# Patient Record
Sex: Male | Born: 1935 | Race: White | Hispanic: No | Marital: Married | Smoking: Current every day smoker
Health system: Southern US, Community
[De-identification: ages and names within clinical notes are randomized; demographics above are authoritative.]

## PROBLEM LIST (undated history)

## (undated) DIAGNOSIS — K644 Residual hemorrhoidal skin tags: Secondary | ICD-10-CM

## (undated) DIAGNOSIS — I5043 Acute on chronic combined systolic (congestive) and diastolic (congestive) heart failure: Secondary | ICD-10-CM

## (undated) DIAGNOSIS — N200 Calculus of kidney: Secondary | ICD-10-CM

## (undated) DIAGNOSIS — Z95820 Peripheral vascular angioplasty status with implants and grafts: Secondary | ICD-10-CM

## (undated) DIAGNOSIS — I739 Peripheral vascular disease, unspecified: Secondary | ICD-10-CM

## (undated) DIAGNOSIS — I701 Atherosclerosis of renal artery: Secondary | ICD-10-CM

## (undated) DIAGNOSIS — I2510 Atherosclerotic heart disease of native coronary artery without angina pectoris: Secondary | ICD-10-CM

## (undated) DIAGNOSIS — J189 Pneumonia, unspecified organism: Secondary | ICD-10-CM

## (undated) DIAGNOSIS — Z7901 Long term (current) use of anticoagulants: Secondary | ICD-10-CM

## (undated) DIAGNOSIS — E785 Hyperlipidemia, unspecified: Secondary | ICD-10-CM

## (undated) DIAGNOSIS — M1990 Unspecified osteoarthritis, unspecified site: Secondary | ICD-10-CM

## (undated) DIAGNOSIS — E119 Type 2 diabetes mellitus without complications: Secondary | ICD-10-CM

## (undated) DIAGNOSIS — I10 Essential (primary) hypertension: Secondary | ICD-10-CM

## (undated) DIAGNOSIS — S72009A Fracture of unspecified part of neck of unspecified femur, initial encounter for closed fracture: Secondary | ICD-10-CM

## (undated) DIAGNOSIS — E7800 Pure hypercholesterolemia, unspecified: Secondary | ICD-10-CM

## (undated) DIAGNOSIS — K219 Gastro-esophageal reflux disease without esophagitis: Secondary | ICD-10-CM

## (undated) DIAGNOSIS — I480 Paroxysmal atrial fibrillation: Secondary | ICD-10-CM

## (undated) DIAGNOSIS — E0590 Thyrotoxicosis, unspecified without thyrotoxic crisis or storm: Secondary | ICD-10-CM

## (undated) DIAGNOSIS — J42 Unspecified chronic bronchitis: Secondary | ICD-10-CM

## (undated) HISTORY — DX: Acute on chronic combined systolic (congestive) and diastolic (congestive) heart failure: I50.43

## (undated) HISTORY — PX: INGUINAL HERNIA REPAIR: SUR1180

## (undated) HISTORY — PX: TONSILLECTOMY: SUR1361

## (undated) HISTORY — PX: KIDNEY STONE SURGERY: SHX686

## (undated) HISTORY — PX: HERNIA REPAIR: SHX51

## (undated) HISTORY — PX: LITHOTRIPSY: SUR834

## (undated) HISTORY — PX: GLAUCOMA SURGERY: SHX656

## (undated) HISTORY — DX: Atherosclerosis of renal artery: I70.1

## (undated) HISTORY — PX: CARDIAC CATHETERIZATION: SHX172

## (undated) HISTORY — PX: SKIN CANCER EXCISION: SHX779

## (undated) HISTORY — PX: CATARACT EXTRACTION W/ INTRAOCULAR LENS IMPLANT: SHX1309

---

## 1979-03-28 DIAGNOSIS — N200 Calculus of kidney: Secondary | ICD-10-CM

## 1979-03-28 HISTORY — DX: Calculus of kidney: N20.0

## 2001-07-04 ENCOUNTER — Encounter: Payer: Self-pay | Admitting: Internal Medicine

## 2001-07-04 ENCOUNTER — Inpatient Hospital Stay (HOSPITAL_COMMUNITY): Admission: AC | Admit: 2001-07-04 | Discharge: 2001-07-08 | Payer: Self-pay | Admitting: *Deleted

## 2001-07-27 HISTORY — PX: CORONARY ANGIOPLASTY WITH STENT PLACEMENT: SHX49

## 2001-08-12 ENCOUNTER — Encounter: Payer: Self-pay | Admitting: Cardiology

## 2001-08-13 ENCOUNTER — Inpatient Hospital Stay (HOSPITAL_COMMUNITY): Admission: RE | Admit: 2001-08-13 | Discharge: 2001-08-15 | Payer: Self-pay | Admitting: Cardiology

## 2001-08-30 ENCOUNTER — Encounter (HOSPITAL_COMMUNITY): Admission: RE | Admit: 2001-08-30 | Discharge: 2001-11-28 | Payer: Self-pay | Admitting: Cardiology

## 2001-09-19 ENCOUNTER — Emergency Department (HOSPITAL_COMMUNITY): Admission: EM | Admit: 2001-09-19 | Discharge: 2001-09-19 | Payer: Self-pay | Admitting: *Deleted

## 2001-11-29 ENCOUNTER — Encounter (HOSPITAL_COMMUNITY): Admission: RE | Admit: 2001-11-29 | Discharge: 2002-02-27 | Payer: Self-pay | Admitting: Cardiology

## 2003-06-28 ENCOUNTER — Ambulatory Visit (HOSPITAL_COMMUNITY): Admission: RE | Admit: 2003-06-28 | Discharge: 2003-06-28 | Payer: Self-pay | Admitting: General Surgery

## 2006-10-25 ENCOUNTER — Ambulatory Visit (HOSPITAL_COMMUNITY): Admission: RE | Admit: 2006-10-25 | Discharge: 2006-10-25 | Payer: Self-pay | Admitting: General Surgery

## 2006-11-05 ENCOUNTER — Encounter (INDEPENDENT_AMBULATORY_CARE_PROVIDER_SITE_OTHER): Payer: Self-pay | Admitting: Specialist

## 2006-11-05 ENCOUNTER — Ambulatory Visit (HOSPITAL_COMMUNITY): Admission: RE | Admit: 2006-11-05 | Discharge: 2006-11-05 | Payer: Self-pay | Admitting: General Surgery

## 2007-03-23 ENCOUNTER — Ambulatory Visit (HOSPITAL_COMMUNITY): Admission: RE | Admit: 2007-03-23 | Discharge: 2007-03-23 | Payer: Self-pay | Admitting: Cardiology

## 2007-05-20 ENCOUNTER — Ambulatory Visit (HOSPITAL_COMMUNITY): Admission: RE | Admit: 2007-05-20 | Discharge: 2007-05-20 | Payer: Self-pay | Admitting: Cardiology

## 2010-12-09 NOTE — Cardiovascular Report (Signed)
Shaun Mclaughlin, OESTREICHER NO.:  [id number]   MEDICAL RECORD NO.:  [id number]          PATIENT TYPE:  OIB   LOCATION:  2899                         FACILITY:  [facility]   PHYSICIAN:  Madaline Savage, M.D.DATE OF BIRTH:  1935-05-15   DATE OF PROCEDURE:  03/23/2007  DATE OF DISCHARGE:                            CARDIAC CATHETERIZATION   PROCEDURES PERFORMED:  1. Selective coronary angiography by Judkins technique.  2. Retrograde left heart catheterization.  3. Left ventricular angiography.   INTERVENTIONS:  None.   COMPLICATIONS:  None.   PATIENT PROFILE:  Mr. Shonka is a very pleasant, 76 year old gentleman  who has previously had right coronary artery stenting in January 2003.  He has paroxysmal atrial fibrillation and has recently been on Coumadin  so Coumadin to Lovenox crossover was begun and because of a recent  abnormal stress test, the patient entered the cath lab today to make  sure that he had no new lesions in his coronary arteries. This was based  on a stress test that did show a possible reversible perfusion defect.  The patient tolerated today's procedure well and no complications  occurred.   RESULTS/PRESSURES:  The left ventricular pressure was 140/30, end-  diastolic pressure 20.  Central aortic pressure 130/47, mean of 76.  The  patient's heart rate during this procedure was in the mid 40s and the  patient is asymptomatic for that.   ANGIOGRAPHIC RESULTS:  The left main coronary artery was normal.  The  LAD coursed to the cardiac apex and gave rise to two diagonal branches.  The only lesion in the LAD was a 50% stenosis in the mid to distal LAD  which appears smooth and unchanged from last cath. The circumflex is  nondominant and normal. RCA is a large and dominant vessel measuring  approximately 5.0 to 5.25 mm in diameter.  There is a radio-opaque stent  distally in the vessel before the RCA bifurcates into a posterior  descending and a huge  posterolateral branch. That stent is patent.  There are luminal irregularities and ectasia in the proximal RCA and  then the mid RCA which appears as it did at the time of the last cath of  August 12, 2001.  Left ventricular angiogram showed mildly depressed LV  systolic function.  I would call it low normal ejection fraction of  approximately 15%.  There was no segmental abnormality noted.  No mitral  regurgitation was seen.   IMPRESSION:  1. Angiographic patency of the distal RCA at the previous stent site      with no restenosis.  2. Trivial RCA luminal narrowings of 50% mid to distal LAD and      proximal RCA and mid RCA 30% each.   PLAN:  The patient will be reassured about his results.  I do believe  that his stress test was falsely positive. He will resume his Lovenox to  Coumadin crossover and once therapeutic he may come off of his Lovenox.  He has had recent A fib i.e. within the last month.   FINAL IMPRESSION:  1. Paroxysmal A fib.  2. Lovenox to Coumadin crossover in process.  3. Angiographically patent RCA stent distally and trivial disease LAD      and proximal mid circumflex.  4. Low normal LV systolic function.           ______________________________  Madaline Savage, M.D.     WHG/MEDQ  D:  03/23/2007  T:  03/23/2007  Job:  [id number]   cc:   Tally Joe, M.D.

## 2010-12-12 NOTE — Op Note (Signed)
Shaun Mclaughlin, Shaun Mclaughlin               ACCOUNT NO.:  [id number]   MEDICAL RECORD NO.:  [id number]          PATIENT TYPE:  AMB   LOCATION:  DAY                          FACILITY:  [facility]   PHYSICIAN:  Anselm Pancoast. Weatherly, M.D.DATE OF BIRTH:  11-26-1934   DATE OF PROCEDURE:  11/05/2006  DATE OF DISCHARGE:                               OPERATIVE REPORT   PREOPERATIVE DIAGNOSIS:  Large right inguinal.   POSTOPERATIVE DIAGNOSIS:  Large right inguinal, indirect.   OPERATION:  Right inguinal herniorrhaphy with mesh reinforcement,  general anesthesia.   SURGEON:  Anselm Pancoast. Zachery Dakins, M.D.   ASSISTANT:  Nurse.   HISTORY:  Shaun Mclaughlin is a 76 year-old male about 180 pounds,  reasonably short, and I was asked to repair his inguinal hernia.  He has  seen Dr. Carolynne Edouard, and in Dr. Billey Chang illness patient wanted to proceed on  with surgery; and I was asked to manage the problem.  He is on a list of  medications for high blood pressure etcetera, but otherwise is still in  good health and still working.  I did not actually examine him standing  up, but preoperatively the hernia definitely goes down into the scrotum;  and he is sort of pudgy with kind of a long abdominal panniculus.   DESCRIPTION OF PROCEDURE:  The patient preoperatively was given a gram  of Ancef and taken to the operative suite with induction of anesthesia  and an LOA tube placed.  The patient had already been prepped prior to  coming to the OR; and we prepped him with Betadine solution and draped  him in a sterile manner.  As far as feeling the symphysis pubis, the  inguinal ligament, and anterior iliac crest; I blocked with about 10 mL  of the Sensorcaine with adrenaline at the ilioinguinal nerve area; and  then also infiltrated the incision area.   Sharp dissection was carried down through the skin and subcutaneous  tissue which was probably 3 inches in thickness and I identified the  external oblique aponeurosis.  The cord  structures, after the external  oblique was opened, were elevated on a Penrose drain.  He has a large  hernia sac going down to the scrotum.  I carefully separated the hernia  sac from the cord structures.  I opened into the hernia sac and it was  probably 8 inches in length and I very carefully separated the neck of  the hernia sac from the cord structures; and then the high sac ligation  was done with to figure-of-eight sutures of 2-0 silk; and the large  hernia sac separated from the cord structures.  There was a lot of  adipose tissue that we kind of carefully separated; and the pedicles  were ligated with 3-0 Vicryl; taking care that the vas, artery, and  veins were not injured.  As far as the ilioinguinal nerve; I could never  really identify the one with the cord structures.  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  The floor had been completely broken down with its large  hernia; and the  floor was closed in kind of a modified Shouldice with a running #0  Prolene instead of a 2-0 because of his size, re-creating the internal  ring; and I went back and tied the two ends together.   Next, a piece of Prolene mesh shaped like a sail was slipped laterally  and placed reinforcing the floor, suturing it from the inguinal ligament  inferior with a running 2-0 Prolene and the two tails were sutured  together laterally to kind of reinforced the inguinal ring.  The  superior flap was sutured down with interrupted 2-0 Prolene; and then  the external oblique was closed with 2-0 Vicryl.  We had to use  Brewsters and extra long Weitlaner for visualization and fortunately I  was able to get his second scrub nurse to get any better exposure  because of his adiposity.   The testicle was in its normal position of the completion of surgery.  I  closed the subcuticular with 4-0 Vicryl with benzoin and Steri-Strips in  the skin.  The patient will possibly be  released today; if you voids  without problems.  If there is any question of difficulty with voiding,  we will let him stay this evening.  He is on blood pressure medicines,  etcetera which will all be restarted shortly after surgery.           ______________________________  Anselm Pancoast. Zachery Dakins, M.D.     WJW/MEDQ  D:  11/05/2006  T:  11/05/2006  Job:  [id number]   cc:   Tally Joe, M.D.  Fax: [phone]
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
## 2010-12-12 NOTE — Cardiovascular Report (Signed)
[place]. [facility]  Patient:    Shaun Mclaughlin, Shaun Mclaughlin Visit Number: [id number] MRN: [id number]          Service Type: MED Location: [phone] Attending Physician:  Ophelia Shoulder Dictated by:   Madaline Savage, M.D. Proc. Date: 08/12/01 Admit Date:  08/12/2001   CC:         Cardiac Catheterization Laboratory   Cardiac Catheterization  PROCEDURES PERFORMED: 1. Selective coronary angiography by Judkins technique. 2. Retrograde left heart catheterization. 3. Left ventricular angiography. 4. Intracoronary stent deployment into the distal right coronary artery by    direct approach.  COMPLICATIONS: None.  ENTRY SITE: Right femoral.  DYE USED: Omnipaque.  PATIENT PROFILE: The patient is a 76 year old gentleman, who recently has had several episodes of paroxysmal atrial fibrillation or atrial flutter, and is now in sinus rhythm. An outpatient Cardiolite has been performed on August 05, 2001, which showed moderate area of ischemia in the inferior wall. Ejection fraction estimate of 46%. The patient was appraised of the result and was agreeable to catheterization and he presents today on an outpatient basis electively.  RESULTS:  PRESSURES: The left ventricular pressure was 120/10, the central aortic pressure 120/50, mean of 75.  No aortic valve gradient by pullback technique.  ANGIOGRAPHIC RESULTS: The left main coronary artery is normal.  The LAD courses to the cardiac apex and gives rise to two diagonal branches. The LAD shows luminal irregularities in the proximal portion of the vessel in multiple locations, no more than 30% severe. The first diagonal branch is normal.  The second larger diagonal branch contains an ostial 50% stenosis. The LAD distally is normal.  The left circumflex gives rise to two obtuse marginal branches. No lesions were seen.  The right coronary artery is a large very dominant vessel which contains  a shepherds crook deformity proximally. There is a 50% lesion proximally, a 50% lesion mid, 80% lesion distally just before the vessel bifurcates of the PDA and PLA.  The left ventricle shows good contractility. The ejection fraction is is 50%. No mitral regurgitation is seen.  INTERVENTIONAL PROCEDURE: The percutaneous coronary intervention was performed from the right groin with a 7 French introducer sheath. The patient was systemically heparinized. A double bolus of Integrilin was given. The guide catheter used was a Kites right 4.09 without side holes, 7 [place]. The guide wire used was a Radiographer, therapeutic. The vessel was directly stented with a 3.5 x 12 mm Express stent. The guide catheter tended to deep throat the vessel. It did respond to gentle pulling back of the tip of the catheter. The wire crossed the lesion with ease. It did create some wire bias in the downgoing limb of the shepherds crook deformity in the vessel.  The vessel was dilated twice, the first was to 14 or 15 atmospheres with a good result. I then did another dilatation for 60 seconds to 17 atmospheres corresponding to an estimated luminal diameter of 3.9. The lesion of 80% was reduced from 80% to 0 and TIMI class 3 distal flow was well preserved. No side branches were lost.  FINAL DIAGNOSES: 1. Mild two-vessel coronary disease with a ischemic Cardiolite in the inferior    wall distribution. Clinically, the 80% lesion is responsible for this. 2. Well preserved left ventricular systolic function. 3. Successful coronary intervention of the distal right coronary artery with    reduction of an 80% lesion to 0%. Dictated by:   Madaline Savage, M.D.  Attending Physician:  Ophelia Shoulder DD:  08/12/01 TD:  08/15/01 Job: [id number]
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
## 2010-12-12 NOTE — Op Note (Signed)
NAME:  Shaun Mclaughlin, Shaun Mclaughlin                         ACCOUNT NO.:  [id number]   MEDICAL RECORD NO.:  [id number]                   PATIENT TYPE:  AMB   LOCATION:  DAY                                  FACILITY:  [facility]   PHYSICIAN:  Ollen Gross. Vernell Morgans, M.D.              DATE OF BIRTH:  May 22, 1935   DATE OF PROCEDURE:  06/28/2003  DATE OF DISCHARGE:  06/28/2003                                 OPERATIVE REPORT   PREOPERATIVE DIAGNOSIS:  Umbilical hernia.   POSTOPERATIVE DIAGNOSIS:  Umbilical hernia.   OPERATION/PROCEDURE:  Umbilical hernia repair.   SURGEON:  Ollen Gross. Carolynne Edouard, M.D.   ANESTHESIA:  General endotracheal anesthesia.   DESCRIPTION OF PROCEDURE:  After informed consent was obtained, the patient  was brought to the operating room and placed in the supine position on the  operating table.  After adequate induction of general anesthesia, the  patient's abdomen was prepped with Betadine and draped in the usual sterile  manner.  The area around the umbilicus was infiltrated with 0.25% Marcaine.  A small infraumbilical incision was made transversely using a #10 blade  knife.  This incision was carried down through the skin and subcutaneous  tissue sharply with the electrocautery.  Blunt dissection was then carried  out in the area until the hernia sac was identified.  Sharp dissection with  the electrocautery was carried out to free the sac from the surround  subcutaneous tissue.  The contents of the sac contained only fat.  A portion  of this fat was excised sharply with the electrocautery.  The rest was able  to be reduced without difficulty.  Back beneath the fascia, the fascial  edges were thick, clean and healthy.  The defect was only about 1 cm in  diameter.  The defect was then closed with interrupted 0 Prolene stitches.  The wound was then irrigated with saline.  The subcutaneous tissue was  freed, undermined a little bit to free it up, and then closed with 3-0  Vicryl interrupted  stitches and the skin was closed with a running 4-0  Monocryl subcuticular stitch.  Benzoin and Steri-Strips and sterile  dressings with cotton balls for compression were applied.  The patient  tolerated the procedure well.  At the end of the case all needle, sponge and  instrument counts were correct.  The patient was then awakened and taken to  the recovery room in stable condition.                                               Ollen Gross. Vernell Morgans, M.D.    PST/MEDQ  D:  07/02/2003  T:  07/02/2003  Job:  [id number]

## 2011-05-08 LAB — I-STAT EC8
BUN: 17
Bicarbonate: 25 — ABNORMAL HIGH
Chloride: 101
Glucose, Bld: 141 — ABNORMAL HIGH
HCT: 43
Hemoglobin: 14.6
Operator id: 115261
pCO2 arterial: 38.4

## 2012-06-13 ENCOUNTER — Other Ambulatory Visit: Payer: Self-pay | Admitting: Cardiovascular Disease

## 2012-06-14 ENCOUNTER — Encounter (HOSPITAL_COMMUNITY): Payer: Self-pay | Admitting: Pharmacy Technician

## 2012-06-22 ENCOUNTER — Ambulatory Visit
Admission: RE | Admit: 2012-06-22 | Discharge: 2012-06-22 | Disposition: A | Discharge status: Home / Self Care | Payer: 59 | Source: Ambulatory Visit | Attending: Cardiovascular Disease | Admitting: Cardiovascular Disease

## 2012-06-22 ENCOUNTER — Other Ambulatory Visit: Payer: Self-pay | Admitting: Cardiovascular Disease

## 2012-06-22 DIAGNOSIS — Z01818 Encounter for other preprocedural examination: Secondary | ICD-10-CM

## 2012-06-28 ENCOUNTER — Encounter (HOSPITAL_COMMUNITY)
Admission: RE | Disposition: A | Discharge status: Home / Self Care | Payer: Self-pay | Source: Ambulatory Visit | Attending: Cardiovascular Disease

## 2012-06-28 ENCOUNTER — Ambulatory Visit (HOSPITAL_COMMUNITY)
Admission: RE | Admit: 2012-06-28 | Discharge: 2012-06-28 | Disposition: A | Discharge status: Home / Self Care | Payer: 59 | Source: Ambulatory Visit | Attending: Cardiovascular Disease | Admitting: Cardiovascular Disease

## 2012-06-28 DIAGNOSIS — Z7901 Long term (current) use of anticoagulants: Secondary | ICD-10-CM | POA: Insufficient documentation

## 2012-06-28 DIAGNOSIS — I2510 Atherosclerotic heart disease of native coronary artery without angina pectoris: Secondary | ICD-10-CM | POA: Insufficient documentation

## 2012-06-28 DIAGNOSIS — I10 Essential (primary) hypertension: Secondary | ICD-10-CM | POA: Insufficient documentation

## 2012-06-28 DIAGNOSIS — Z9861 Coronary angioplasty status: Secondary | ICD-10-CM | POA: Insufficient documentation

## 2012-06-28 DIAGNOSIS — I701 Atherosclerosis of renal artery: Secondary | ICD-10-CM | POA: Insufficient documentation

## 2012-06-28 DIAGNOSIS — E785 Hyperlipidemia, unspecified: Secondary | ICD-10-CM | POA: Insufficient documentation

## 2012-06-28 DIAGNOSIS — E0590 Thyrotoxicosis, unspecified without thyrotoxic crisis or storm: Secondary | ICD-10-CM | POA: Insufficient documentation

## 2012-06-28 DIAGNOSIS — I771 Stricture of artery: Secondary | ICD-10-CM | POA: Insufficient documentation

## 2012-06-28 HISTORY — PX: UPPER EXTREMITY ANGIOGRAM: SHX6310

## 2012-06-28 HISTORY — PX: LEFT HEART CATHETERIZATION WITH CORONARY ANGIOGRAM: SHX5451

## 2012-06-28 LAB — PROTIME-INR
INR: 1.41 (ref 0.00–1.49)
Prothrombin Time: 16.9 seconds — ABNORMAL HIGH (ref 11.6–15.2)

## 2012-06-28 SURGERY — LEFT HEART CATHETERIZATION WITH CORONARY ANGIOGRAM
Anesthesia: LOCAL

## 2012-06-28 MED ORDER — MIDAZOLAM HCL 2 MG/2ML IJ SOLN
INTRAMUSCULAR | Status: AC
  Filled 2012-06-28: qty 2

## 2012-06-28 MED ORDER — HYDRALAZINE HCL 20 MG/ML IJ SOLN
10.0000 mg | Freq: Four times a day (QID) | INTRAMUSCULAR | Status: DC | PRN
  Administered 2012-06-28: 10 mg via INTRAVENOUS

## 2012-06-28 MED ORDER — LIDOCAINE HCL (PF) 1 % IJ SOLN
INTRAMUSCULAR | Status: AC
  Filled 2012-06-28: qty 30

## 2012-06-28 MED ORDER — HEPARIN (PORCINE) IN NACL 2-0.9 UNIT/ML-% IJ SOLN
INTRAMUSCULAR | Status: AC
  Filled 2012-06-28: qty 1000

## 2012-06-28 MED ORDER — SODIUM CHLORIDE 0.9 % IV SOLN
INTRAVENOUS | Status: DC
  Administered 2012-06-28: 12:00:00 via INTRAVENOUS

## 2012-06-28 MED ORDER — FENTANYL CITRATE 0.05 MG/ML IJ SOLN
INTRAMUSCULAR | Status: AC
  Filled 2012-06-28: qty 2

## 2012-06-28 MED ORDER — HYDRALAZINE HCL 20 MG/ML IJ SOLN
INTRAMUSCULAR | Status: AC
  Filled 2012-06-28: qty 1

## 2012-06-28 MED ORDER — ONDANSETRON HCL 4 MG/2ML IJ SOLN: 4.0000 mg | Freq: Four times a day (QID) | INTRAMUSCULAR | Status: DC | PRN

## 2012-06-28 MED ORDER — SODIUM CHLORIDE 0.9 % IJ SOLN: 3.0000 mL | INTRAMUSCULAR | Status: DC | PRN

## 2012-06-28 MED ORDER — SODIUM CHLORIDE 0.9 % IV SOLN: INTRAVENOUS | Status: DC

## 2012-06-28 MED ORDER — NITROGLYCERIN 0.2 MG/ML ON CALL CATH LAB
INTRAVENOUS | Status: AC
  Filled 2012-06-28: qty 1

## 2012-06-28 MED ORDER — ACETAMINOPHEN 325 MG PO TABS: 650.0000 mg | ORAL_TABLET | ORAL | Status: DC | PRN

## 2012-06-28 MED ORDER — MORPHINE SULFATE 2 MG/ML IJ SOLN: 1.0000 mg | INTRAMUSCULAR | Status: DC | PRN

## 2012-06-28 MED ORDER — DIAZEPAM 5 MG PO TABS
5.0000 mg | ORAL_TABLET | ORAL | Status: AC
  Administered 2012-06-28: 5 mg via ORAL
  Filled 2012-06-28: qty 1

## 2012-06-28 NOTE — Op Note (Signed)
JARRON CURLEY is a 77 y.o. male    [id number] LOCATION:  FACILITY: [facility]  PHYSICIAN: Nanetta Batty, M.D. 03-04-1935   DATE OF PROCEDURE:  06/28/2012  DATE OF DISCHARGE:  [facility]  CARDIAC CATHETERIZATION     History obtained from chart review. Mr. Pettengill is a 76 year old Caucasian male patient of Dr. Royann Shivers  with a history of CAD status post RCA stenting remotely with angiography in 2000 a performer Dr. Lavonne Chick that revealed a patent RCA stent and otherwise minimal CAD. He has a history of treated hypertension, dyslipidemia and discontinued tobacco abuse. We will following his upper extremity Dopplers have shown progression of disease with left upper extremity location. A Myoview stress test showed ischemia in several vascular territories and because of this he presents today for  Cardiac catheterization and left subclavian angiography and potential percutaneous intervention. He was on Coumadin for paroxysmal atrial fibrillation which was discontinued prior to the procedure and he remains today in sinus rhythm on antiarrhythmic medication.   PROCEDURE DESCRIPTION:    The patient was brought to the second floor  [place] Cardiac cath lab in the postabsorptive state. He was premedicated with Valium 5 mg by mouth, IV Versed and fentanyl. His right groinwas prepped and shaved in usual sterile fashion. Xylocaine 1% was used for local anesthesia. A 5 French sheath was inserted into the right common femoral artery using standard Seldinger technique. 5 French right and left Judkins diagnostic catheters a 5 French pigtail catheter were used for selective coronary angiography, left ventriculography, and selective right and left subclavian artery angiography. Visipaque dye was used for the entirety of the case. Retrograde aortic, left ventricular and pullback pressures were recorded. A pullback pressure was also recorded across the distal left subclavian artery  stenosis.   HEMODYNAMICS:    AO SYSTOLIC/AO DIASTOLIC: 164/68   LV SYSTOLIC/LV DIASTOLIC: 164/24  ANGIOGRAPHIC RESULTS:   1. Left main; normal  2. LAD; minor irregularity 3. Left circumflex; nondominant moderate regularities.  4. Right coronary artery; dominant with a patent distal stent and 40% stenosis the proximal and midportion 5. Left ventriculography; RAO left ventriculogram was performed using  25 mL of Visipaque dye at 12 mL/second. The overall LVEF estimated  60 % Without wall motion abnormalities 6. Left subclavian artery-there was a 50-60% left renal artery stenosis beyond the LIMA takeoff followed by a 90% stenosis in the distal left leg and artery/proximal left axillary artery with a 50 mm pullback gradient.  IMPRESSION:Mr. Bucklin has essentially normal coronary arteries and normal left ventricular function. The Myoview stress test was false positive. He has a 90% stenosis in the distal left subclavian artery, proximal left axillary artery with a 50 mm pullback gradient. I believe this is responsible for his left upper extremity claudication however I am somewhat hesitant to stent this given that it is in an atypical location for atherosclerosis . In addition to the potentially any anatomic location of torsion stent strut fracture remains a potential problem. Because of this I am going to send him home as an outpatient, and I will review the angiograms with my colleagues prior to making a decision regarding intervention. The sheath was removed and pressure was held on the groin to achieve hemostasis. The patient left the Cath Lab in stable condition. He'll be gently hydrated to remain recumbent for 4 hours after which time he'll be discharged home and will followup with me in one to 2 weeks.  [name] MD, [facility] 06/28/2012  3:46 PM

## 2012-06-28 NOTE — H&P (Signed)
  H & P will be scanned in.  Pt was reexamined and existing H & P reviewed. No changes found.  [name], MD [facility] 06/28/2012 3:01 PM

## 2012-07-21 ENCOUNTER — Encounter (HOSPITAL_COMMUNITY): Payer: Self-pay | Admitting: Pharmacy Technician

## 2012-07-27 HISTORY — PX: SUBCLAVIAN STENT PLACEMENT: SUR1038

## 2012-08-04 ENCOUNTER — Ambulatory Visit (HOSPITAL_COMMUNITY)
Admission: RE | Admit: 2012-08-04 | Discharge: 2012-08-05 | Disposition: A | Discharge status: Home / Self Care | Payer: 59 | Source: Ambulatory Visit | Attending: Cardiovascular Disease | Admitting: Cardiovascular Disease

## 2012-08-04 ENCOUNTER — Encounter (HOSPITAL_COMMUNITY): Payer: Self-pay | Admitting: General Practice

## 2012-08-04 ENCOUNTER — Encounter (HOSPITAL_COMMUNITY)
Admission: RE | Disposition: A | Discharge status: Home / Self Care | Payer: Self-pay | Source: Ambulatory Visit | Attending: Cardiovascular Disease

## 2012-08-04 DIAGNOSIS — I2510 Atherosclerotic heart disease of native coronary artery without angina pectoris: Secondary | ICD-10-CM | POA: Insufficient documentation

## 2012-08-04 DIAGNOSIS — I4819 Other persistent atrial fibrillation: Secondary | ICD-10-CM | POA: Diagnosis present

## 2012-08-04 DIAGNOSIS — E785 Hyperlipidemia, unspecified: Secondary | ICD-10-CM | POA: Insufficient documentation

## 2012-08-04 DIAGNOSIS — Z9861 Coronary angioplasty status: Secondary | ICD-10-CM | POA: Insufficient documentation

## 2012-08-04 DIAGNOSIS — I4891 Unspecified atrial fibrillation: Secondary | ICD-10-CM | POA: Insufficient documentation

## 2012-08-04 DIAGNOSIS — E119 Type 2 diabetes mellitus without complications: Secondary | ICD-10-CM | POA: Diagnosis present

## 2012-08-04 DIAGNOSIS — Z7901 Long term (current) use of anticoagulants: Secondary | ICD-10-CM | POA: Insufficient documentation

## 2012-08-04 DIAGNOSIS — I739 Peripheral vascular disease, unspecified: Secondary | ICD-10-CM | POA: Insufficient documentation

## 2012-08-04 DIAGNOSIS — I771 Stricture of artery: Secondary | ICD-10-CM | POA: Insufficient documentation

## 2012-08-04 DIAGNOSIS — I10 Essential (primary) hypertension: Secondary | ICD-10-CM | POA: Insufficient documentation

## 2012-08-04 HISTORY — DX: Pure hypercholesterolemia, unspecified: E78.00

## 2012-08-04 HISTORY — DX: Thyrotoxicosis, unspecified without thyrotoxic crisis or storm: E05.90

## 2012-08-04 HISTORY — DX: Gastro-esophageal reflux disease without esophagitis: K21.9

## 2012-08-04 HISTORY — DX: Peripheral vascular angioplasty status with implants and grafts: Z95.820

## 2012-08-04 HISTORY — DX: Unspecified osteoarthritis, unspecified site: M19.90

## 2012-08-04 HISTORY — DX: Pneumonia, unspecified organism: J18.9

## 2012-08-04 HISTORY — DX: Atherosclerotic heart disease of native coronary artery without angina pectoris: I25.10

## 2012-08-04 HISTORY — DX: Unspecified chronic bronchitis: J42

## 2012-08-04 HISTORY — PX: UNILATERAL UPPER EXTREMEITY ANGIOGRAM: SHX5517

## 2012-08-04 HISTORY — DX: Type 2 diabetes mellitus without complications: E11.9

## 2012-08-04 HISTORY — DX: Residual hemorrhoidal skin tags: K64.4

## 2012-08-04 HISTORY — DX: Hyperlipidemia, unspecified: E78.5

## 2012-08-04 HISTORY — DX: Calculus of kidney: N20.0

## 2012-08-04 HISTORY — DX: Paroxysmal atrial fibrillation: I48.0

## 2012-08-04 HISTORY — DX: Essential (primary) hypertension: I10

## 2012-08-04 HISTORY — DX: Long term (current) use of anticoagulants: Z79.01

## 2012-08-04 HISTORY — DX: Peripheral vascular disease, unspecified: I73.9

## 2012-08-04 LAB — POCT ACTIVATED CLOTTING TIME
Activated Clotting Time: 317 seconds
Activated Clotting Time: 187 seconds

## 2012-08-04 LAB — GLUCOSE, CAPILLARY
Glucose-Capillary: 140 mg/dL — ABNORMAL HIGH (ref 70–99)
Glucose-Capillary: 142 mg/dL — ABNORMAL HIGH (ref 70–99)

## 2012-08-04 LAB — PROTIME-INR: INR: 1.65 — ABNORMAL HIGH (ref 0.00–1.49)

## 2012-08-04 SURGERY — UNILATERAL UPPER EXTREMEITY ANGIOGRAM
Anesthesia: LOCAL

## 2012-08-04 MED ORDER — FENTANYL CITRATE 0.05 MG/ML IJ SOLN
INTRAMUSCULAR | Status: AC
  Filled 2012-08-04: qty 2

## 2012-08-04 MED ORDER — NIACIN ER (ANTIHYPERLIPIDEMIC) 500 MG PO TBCR
500.0000 mg | EXTENDED_RELEASE_TABLET | Freq: Every day | ORAL | Status: DC
  Filled 2012-08-04 (×2): qty 1

## 2012-08-04 MED ORDER — LISINOPRIL 10 MG PO TABS
10.0000 mg | ORAL_TABLET | Freq: Every day | ORAL | Status: DC
  Administered 2012-08-04: 10 mg via ORAL
  Filled 2012-08-04: qty 1

## 2012-08-04 MED ORDER — ACETAMINOPHEN 325 MG PO TABS: 650.0000 mg | ORAL_TABLET | ORAL | Status: DC | PRN

## 2012-08-04 MED ORDER — HEPARIN (PORCINE) IN NACL 2-0.9 UNIT/ML-% IJ SOLN
INTRAMUSCULAR | Status: AC
  Filled 2012-08-04: qty 1000

## 2012-08-04 MED ORDER — ASPIRIN 81 MG PO CHEW
CHEWABLE_TABLET | ORAL | Status: AC
  Filled 2012-08-04: qty 4

## 2012-08-04 MED ORDER — PROPYLTHIOURACIL 50 MG PO TABS
50.0000 mg | ORAL_TABLET | Freq: Every day | ORAL | Status: DC
  Administered 2012-08-04 – 2012-08-05 (×2): 50 mg via ORAL
  Filled 2012-08-04 (×2): qty 1

## 2012-08-04 MED ORDER — ASPIRIN EC 81 MG PO TBEC: 81.0000 mg | DELAYED_RELEASE_TABLET | Freq: Every day | ORAL | Status: DC

## 2012-08-04 MED ORDER — HEPARIN SODIUM (PORCINE) 1000 UNIT/ML IJ SOLN
INTRAMUSCULAR | Status: AC
  Filled 2012-08-04: qty 1

## 2012-08-04 MED ORDER — CLOPIDOGREL BISULFATE 75 MG PO TABS
75.0000 mg | ORAL_TABLET | Freq: Every day | ORAL | Status: DC
  Administered 2012-08-05: 75 mg via ORAL
  Filled 2012-08-04: qty 1

## 2012-08-04 MED ORDER — SODIUM CHLORIDE 0.9 % IJ SOLN: 3.0000 mL | INTRAMUSCULAR | Status: DC | PRN

## 2012-08-04 MED ORDER — LIDOCAINE HCL (PF) 1 % IJ SOLN
INTRAMUSCULAR | Status: AC
  Filled 2012-08-04: qty 30

## 2012-08-04 MED ORDER — HYDRALAZINE HCL 20 MG/ML IJ SOLN
10.0000 mg | INTRAMUSCULAR | Status: DC
  Administered 2012-08-04 – 2012-08-05 (×3): 10 mg via INTRAVENOUS
  Filled 2012-08-04 (×2): qty 0.5

## 2012-08-04 MED ORDER — DIAZEPAM 5 MG PO TABS
5.0000 mg | ORAL_TABLET | ORAL | Status: AC
  Administered 2012-08-04: 5 mg via ORAL
  Filled 2012-08-04: qty 1

## 2012-08-04 MED ORDER — PANTOPRAZOLE SODIUM 40 MG PO TBEC
40.0000 mg | DELAYED_RELEASE_TABLET | Freq: Every day | ORAL | Status: DC
  Administered 2012-08-05: 40 mg via ORAL
  Filled 2012-08-04: qty 1

## 2012-08-04 MED ORDER — SOTALOL HCL 120 MG PO TABS
60.0000 mg | ORAL_TABLET | Freq: Two times a day (BID) | ORAL | Status: DC
  Administered 2012-08-04 – 2012-08-05 (×2): 60 mg via ORAL
  Filled 2012-08-04 (×4): qty 0.5

## 2012-08-04 MED ORDER — SODIUM CHLORIDE 0.9 % IV SOLN
INTRAVENOUS | Status: DC
  Administered 2012-08-04: 1000 mL via INTRAVENOUS

## 2012-08-04 MED ORDER — ONDANSETRON HCL 4 MG/2ML IJ SOLN: 4.0000 mg | Freq: Four times a day (QID) | INTRAMUSCULAR | Status: DC | PRN

## 2012-08-04 MED ORDER — CLOPIDOGREL BISULFATE 300 MG PO TABS
ORAL_TABLET | ORAL | Status: AC
  Filled 2012-08-04: qty 1

## 2012-08-04 MED ORDER — SALINE SPRAY 0.65 % NA SOLN
1.0000 | NASAL | Status: DC | PRN
  Administered 2012-08-04: 1 via NASAL
  Filled 2012-08-04: qty 44

## 2012-08-04 MED ORDER — LATANOPROST 0.005 % OP SOLN
1.0000 [drp] | Freq: Every day | OPHTHALMIC | Status: DC
Start: 2012-08-04 — End: 2012-08-05
  Administered 2012-08-04: 22:00:00 1 [drp] via OPHTHALMIC
  Filled 2012-08-04: qty 2.5

## 2012-08-04 MED ORDER — HYDRALAZINE HCL 20 MG/ML IJ SOLN
INTRAMUSCULAR | Status: AC
Start: 2012-08-04 — End: 2012-08-04
  Administered 2012-08-04: 10 mg via INTRAVENOUS
  Filled 2012-08-04: qty 1

## 2012-08-04 MED ORDER — ATORVASTATIN CALCIUM 40 MG PO TABS
40.0000 mg | ORAL_TABLET | Freq: Every day | ORAL | Status: DC
  Administered 2012-08-04: 20:00:00 40 mg via ORAL
  Filled 2012-08-04 (×2): qty 1

## 2012-08-04 MED ORDER — BRIMONIDINE TARTRATE-TIMOLOL 0.2-0.5 % OP SOLN: 1.0000 [drp] | Freq: Two times a day (BID) | OPHTHALMIC | Status: DC

## 2012-08-04 MED ORDER — MORPHINE SULFATE 2 MG/ML IJ SOLN: 2.0000 mg | INTRAMUSCULAR | Status: DC | PRN

## 2012-08-04 MED ORDER — TIMOLOL MALEATE 0.5 % OP SOLN
1.0000 [drp] | Freq: Two times a day (BID) | OPHTHALMIC | Status: DC
  Administered 2012-08-04: 1 [drp] via OPHTHALMIC
  Filled 2012-08-04: qty 5

## 2012-08-04 MED ORDER — MIDAZOLAM HCL 2 MG/2ML IJ SOLN
INTRAMUSCULAR | Status: AC
  Filled 2012-08-04: qty 2

## 2012-08-04 MED ORDER — BRIMONIDINE TARTRATE 0.2 % OP SOLN
1.0000 [drp] | Freq: Two times a day (BID) | OPHTHALMIC | Status: DC
  Administered 2012-08-04: 22:00:00 1 [drp] via OPHTHALMIC
  Filled 2012-08-04: qty 5

## 2012-08-04 MED ORDER — HYDRALAZINE HCL 20 MG/ML IJ SOLN
INTRAMUSCULAR | Status: AC
  Filled 2012-08-04: qty 1

## 2012-08-04 MED ORDER — SODIUM CHLORIDE 0.9 % IV SOLN: INTRAVENOUS | Status: AC

## 2012-08-04 MED ORDER — ASPIRIN EC 325 MG PO TBEC
325.0000 mg | DELAYED_RELEASE_TABLET | Freq: Every day | ORAL | Status: DC
  Administered 2012-08-05: 325 mg via ORAL
  Filled 2012-08-04 (×2): qty 1

## 2012-08-04 NOTE — H&P (Signed)
  H & P will be scanned in.  Pt was reexamined and existing H & P reviewed. No changes found.  [name], MD [facility] 08/04/2012 10:23 AM

## 2012-08-04 NOTE — Progress Notes (Signed)
Utilization Review Completed.   Antonique Langford, RN, BSN Nurse Case Manager  [phone]  

## 2012-08-04 NOTE — CV Procedure (Signed)
Shaun Mclaughlin is a 78 y.o. male    [id number] LOCATION:  FACILITY: [facility]  PHYSICIAN: Nanetta Batty, M.D. Feb 02, 1935   DATE OF PROCEDURE:  08/04/2012  DATE OF DISCHARGE:  [facility]  PV Intervention    History obtained from chart review. [name] is a 78 year old married Caucasian male father of 3 living children who has a history of CAD status post RCA stenting in the past. 7 problems include paroxysmal atrial fibrillation on Coumadin anticoagulation and sotalol. He has hypertension and dyslipidemia as well. He had been complaining of left upper extremity discomfort Dopplers in our office suggested in subclavian region along with an upper extremity blood pressure gradient. I can't him on 06/28/2012 revealing a widely patent RCA stent with no other significant CAD. He had an 80-90% distal left subclavian artery stenosis beyond the LIMA and vertebral takeoff. This was an unusual location for an atherosclerotic plaque. He spoke back today for percutaneous intervention.   PROCEDURE DESCRIPTION:   The patient was brought to the second floor [place] Cardiac cath lab in the postabsorptive state. He was premedicated with Valium 5 mg by mouth, IV Versed and fentanyl.Marland Kitchen His right groin was prepped and shaved in usual sterile fashion. Xylocaine 1% was used for local anesthesia. A 6 French sheath was inserted into the recommend femoral artery using standard Seldinger technique. The patient received 5000 units  of upper and  intravenously.  PCP is documented at 317. A total of 60 cc is administered during the case.    HEMODYNAMICS:    AO SYSTOLIC/AO DIASTOLIC: 161/66    ANGIOGRAPHIC RESULTS:   The left subclavian artery was accessed with a 6 [place] JR 4 diagnostic catheter and a long 035 of Versacore  was used to cross the distal left subclavian artery lesion. Following this predilatation was performed with a 5 x 2 balloon and stenting with a 10 x 40 mm long  Cordis Smart Nitinol self-expanding stent. Post inflation performed with an 8 mm x 2 cm balloon resulting in reduction of 90% distal left subclavian artery stenosis to less than 20% residual without dissection. The guide wire and sheath were then withdrawn and the 90 cm length sheath was exchanged over the wire for a short 6 French sheath. The patient left the Cath Lab in stable condition. He did receive 4 baby aspirin and 300 mg of by mouth Plavix.   IMPRESSION:Successful left subclavian artery PTA and stenting using a Nitinol expanding stent for upper extremity claudication. The patient will be discharged on low-dose aspirin, Plavix and Coumadin which will be started as an outpatient. Plavix will be continued for one month at which which time it will be discontinued and he will continue on low-dose aspirin and Coumadin indefinitely. We'll get followup Dopplers in the office of left upper extremity now seen back after that  [name]. MD, [facility] 08/04/2012 11:14 AM

## 2012-08-05 ENCOUNTER — Encounter (HOSPITAL_COMMUNITY): Payer: Self-pay | Admitting: Cardiology

## 2012-08-05 ENCOUNTER — Other Ambulatory Visit (HOSPITAL_COMMUNITY): Payer: Self-pay | Admitting: Cardiology

## 2012-08-05 DIAGNOSIS — I771 Stricture of artery: Secondary | ICD-10-CM

## 2012-08-05 DIAGNOSIS — Z7901 Long term (current) use of anticoagulants: Secondary | ICD-10-CM

## 2012-08-05 DIAGNOSIS — E785 Hyperlipidemia, unspecified: Secondary | ICD-10-CM | POA: Diagnosis present

## 2012-08-05 DIAGNOSIS — E119 Type 2 diabetes mellitus without complications: Secondary | ICD-10-CM

## 2012-08-05 DIAGNOSIS — Z95820 Peripheral vascular angioplasty status with implants and grafts: Secondary | ICD-10-CM

## 2012-08-05 DIAGNOSIS — I2510 Atherosclerotic heart disease of native coronary artery without angina pectoris: Secondary | ICD-10-CM | POA: Diagnosis present

## 2012-08-05 DIAGNOSIS — I739 Peripheral vascular disease, unspecified: Secondary | ICD-10-CM

## 2012-08-05 DIAGNOSIS — I10 Essential (primary) hypertension: Secondary | ICD-10-CM

## 2012-08-05 DIAGNOSIS — I4819 Other persistent atrial fibrillation: Secondary | ICD-10-CM | POA: Diagnosis present

## 2012-08-05 DIAGNOSIS — I480 Paroxysmal atrial fibrillation: Secondary | ICD-10-CM

## 2012-08-05 HISTORY — DX: Paroxysmal atrial fibrillation: I48.0

## 2012-08-05 HISTORY — DX: Essential (primary) hypertension: I10

## 2012-08-05 HISTORY — DX: Atherosclerotic heart disease of native coronary artery without angina pectoris: I25.10

## 2012-08-05 HISTORY — DX: Type 2 diabetes mellitus without complications: E11.9

## 2012-08-05 HISTORY — DX: Long term (current) use of anticoagulants: Z79.01

## 2012-08-05 HISTORY — DX: Peripheral vascular disease, unspecified: I73.9

## 2012-08-05 HISTORY — DX: Peripheral vascular angioplasty status with implants and grafts: Z95.820

## 2012-08-05 HISTORY — DX: Hyperlipidemia, unspecified: E78.5

## 2012-08-05 LAB — BASIC METABOLIC PANEL
BUN: 9 mg/dL (ref 6–23)
CO2: 28 mEq/L (ref 19–32)
Chloride: 102 mEq/L (ref 96–112)
Creatinine, Ser: 0.74 mg/dL (ref 0.50–1.35)
GFR calc Af Amer: >90 mL/min (ref >90)
Potassium: 4 mEq/L (ref 3.5–5.1)

## 2012-08-05 LAB — CBC
HCT: 38.6 % — ABNORMAL LOW (ref 39.0–52.0)
Hemoglobin: 13.3 g/dL (ref 13.0–17.0)
MCV: 96 fL (ref 78.0–100.0)
WBC: 6.9 10*3/uL (ref 4.0–10.5)

## 2012-08-05 LAB — GLUCOSE, CAPILLARY: Glucose-Capillary: 124 mg/dL — ABNORMAL HIGH (ref 70–99)

## 2012-08-05 MED ORDER — LISINOPRIL 20 MG PO TABS: 20.0000 mg | ORAL_TABLET | Freq: Every day | ORAL | Status: DC

## 2012-08-05 MED ORDER — CLOPIDOGREL BISULFATE 75 MG PO TABS: 75.0000 mg | ORAL_TABLET | Freq: Every day | ORAL | Status: DC

## 2012-08-05 MED ORDER — AMLODIPINE BESYLATE 5 MG PO TABS
5.0000 mg | ORAL_TABLET | Freq: Every day | ORAL | Status: DC
  Administered 2012-08-05: 5 mg via ORAL
  Filled 2012-08-05: qty 1

## 2012-08-05 MED ORDER — LISINOPRIL 10 MG PO TABS: 10.0000 mg | ORAL_TABLET | Freq: Every day | ORAL | Status: DC

## 2012-08-05 MED ORDER — LISINOPRIL 10 MG PO TABS: 20.0000 mg | ORAL_TABLET | Freq: Every day | ORAL | Status: DC

## 2012-08-05 MED ORDER — SALINE SPRAY 0.65 % NA SOLN: 1.0000 | NASAL | Status: DC | PRN

## 2012-08-05 MED ORDER — LISINOPRIL 20 MG PO TABS
20.0000 mg | ORAL_TABLET | Freq: Every day | ORAL | Status: DC
  Administered 2012-08-05: 09:00:00 20 mg via ORAL
  Filled 2012-08-05: qty 1

## 2012-08-05 NOTE — Discharge Summary (Signed)
Physician Discharge Summary  Patient ID: Shaun Mclaughlin MRN: [id number] DOB/AGE: 30-May-1935 78 y.o.  Admit date: 08/04/2012 Discharge date: 08/05/2012  Discharge Diagnoses:  Principal Problem:  *PVD (peripheral vascular disease) with claudication, Lt Upper ext. pain, and known 80-90%distal Lt. subclavian artery stenosis  Active Problems:  S/P angioplasty with stent to Lt. subclavian artery -Nitinol stent 08/04/12  CAD (coronary artery disease),Hx RCA stenting-patent 06/2012   PAF (paroxysmal atrial fibrillation), maintaining SR  Chronic anticoagulation, coumadin for PAF  DM (diabetes mellitus)  Hyperlipidemia  HTN (hypertension), poorly controlled this stay   Discharged Condition: good  PROCEDURES: 08/04/12  PV angiogram of upper ext. By Dr. Allyson Sabal 08/04/12  Successful left subclavian artery PTA and stenting using a Nitinol expanding stent for upper extremity claudication by Dr. Allyson Sabal.    Hospital Course: Shaun Mclaughlin is a 78 year old married Caucasian male with a history of CAD status post RCA stenting in the past. His problems include paroxysmal atrial fibrillation on Coumadin anticoagulation and sotalol. He has hypertension and dyslipidemia as well. He had been complaining of left upper extremity discomfort Dopplers in our office suggested in subclavian region along with an upper extremity blood pressure gradient.  Dr. Allyson Sabal cathed  him on 06/28/2012 revealing a widely patent RCA stent with no other significant CAD. He had an 80-90% distal left subclavian artery stenosis beyond the LIMA and vertebral takeoff. This was an unusual location for an atherosclerotic plaque. He was brought back 76/9/14 for percutaneous intervention.   Pt tolerated the procedure with out complications, undergoing successful left subclavian artery PTA and stenting using a Nitinol expanding stent for upper extremity claudication.  During the night he was hypertensive and received IV hydralazine.  The next AM he was stable  without complaints.  BP in left arm is now higher than in the right: 160/70 vs 140/60 mm Hg, his lisinopril was increased to 20 mg daily.  Plan is for ASA, Plavix, and coumadin for 1 month and then Dr. Allyson Sabal plans to stop the plavix.  Pt will resume coumadin tonight and follow up with coumadin clinic as planned.  Pt was seen by Dr. Royann Shivers and found ready/stable for discharge home.    Consults: None  Significant Diagnostic Studies:  BMET    Component Value Date/Time   NA 140 08/05/2012 0550   K 4.0 08/05/2012 0550   CL 102 08/05/2012 0550   CO2 28 08/05/2012 0550   GLUCOSE 133* 08/05/2012 0550   BUN 9 08/05/2012 0550   CREATININE 0.74 08/05/2012 0550   CALCIUM 9.0 08/05/2012 0550   GFRNONAA 88* 08/05/2012 0550   GFRAA >90 08/05/2012 0550    CBC    Component Value Date/Time   WBC 6.9 08/05/2012 0550   RBC 4.02* 08/05/2012 0550   HGB 13.3 08/05/2012 0550   HCT 38.6* 08/05/2012 0550   PLT 197 08/05/2012 0550   MCV 96.0 08/05/2012 0550   MCH 33.1 08/05/2012 0550   MCHC 34.5 08/05/2012 0550   RDW 15.0 08/05/2012 0550       Discharge Exam: Blood pressure 191/67, pulse 65, temperature 97.7 F (36.5 C), temperature source Oral, resp. rate 22, height 5' 7.5" (1.715 m), weight 81 kg (178 lb 9.2 oz), SpO2 95.00%.  PE the AM of discharge  PE: General:alert and oriented, no complaints  Heart:S1S2 RRR  Lungs:clear without rales, rhonchi or wheezes  Abd:+ BS soft, non tender  Ext:no edema  There was ecchymosis at cath site but no hematoma.  Disposition: 01-Home or Self Care  Discharge Orders    Future Appointments: Provider: Department: Dept Phone: Center:   08/15/2012 8:00 AM [facility] [phone] None       Medication List     As of 08/05/2012  3:10 PM    TAKE these medications         aspirin EC 81 MG tablet   Take 81 mg by mouth daily.      atorvastatin 40 MG tablet   Commonly known as: LIPITOR   Take 40 mg by  mouth daily.      beta carotene w/minerals tablet   Take 1 tablet by mouth daily.      clopidogrel 75 MG tablet   Commonly known as: PLAVIX   Take 1 tablet (75 mg total) by mouth daily with breakfast.      COMBIGAN 0.2-0.5 % ophthalmic solution   Generic drug: brimonidine-timolol   Place 1 drop into the right eye every 12 (twelve) hours.      latanoprost 0.005 % ophthalmic solution   Commonly known as: XALATAN   Place 1 drop into both eyes at bedtime.      lisinopril 20 MG tablet   Commonly known as: PRINIVIL,ZESTRIL   Take 1 tablet (20 mg total) by mouth daily.      metFORMIN 500 MG 24 hr tablet   Commonly known as: GLUCOPHAGE-XR   Take 500 mg by mouth daily with breakfast.      multivitamin with minerals Tabs   Take 1 tablet by mouth daily.      niacin 500 MG CR tablet   Commonly known as: NIASPAN   Take 500 mg by mouth at bedtime.      omeprazole 20 MG capsule   Commonly known as: PRILOSEC   Take 20 mg by mouth daily.      propylthiouracil 50 MG tablet   Commonly known as: PTU   Take 50 mg by mouth daily.      sodium chloride 0.65 % Soln nasal spray   Commonly known as: OCEAN   Place 1 spray into the nose as needed for congestion.      sotalol 120 MG tablet   Commonly known as: BETAPACE   Take 60 mg by mouth 2 (two) times daily.      vitamin C with rose hips 1000 MG tablet   Take 1,000 mg by mouth daily.      warfarin 5 MG tablet   Commonly known as: COUMADIN   Take 5-7.5 mg by mouth daily. Take 7.5mg  (1 tablets) on Mondays and Fridays.  Take 5mg  (1 tablet) on all remaining days.        Follow-up Information    Follow up with [name], MD. (the office will call you for the date and time of upper extremity dopplers, and follow up appt with Dr. Allyson Sabal)    Contact information:   [address] [place] [address] [phone]       Follow up with [name], MD. On 08/11/2012. (at 8:20 with [facility] for coumadin clinic)     Contact information:   [address] [place] [address] [phone]        Discharge Instructions: Call [facility] Center if any bleeding, swelling or drainage at cath site.  May shower, no tub baths for 48 hours for groin sticks.   Heart Healthy Diabetic diet  DO NOT TAKE METFORMIN UNTIL 08/07/12 -Sunday  It  may interact with cath dye.  No work until the 16th of January  We increased  Your dose of lisinopril   Signed: INGOLD,LAURA R 08/05/2012, 3:10 PM

## 2012-08-05 NOTE — Progress Notes (Signed)
Subjective: Continues with HTN, SR  Objective: Vital signs in last 24 hours: Temp:  [97.5 F (36.4 C)-97.8 F (36.6 C)] 97.8 F (36.6 C) (01/10 0430) Pulse Rate:  [52-93] 65  (01/10 0430) Resp:  [14-28] 18  (01/10 0745) BP: (140-199)/(49-98) 171/75 mmHg (01/10 0745) SpO2:  [95 %-98 %] 95 % (01/10 0430) Weight:  [81 kg (178 lb 9.2 oz)] 81 kg (178 lb 9.2 oz) (01/10 0025) Weight change:  Last BM Date: 08/04/12 Intake/Output from previous day: -430 01/09 0701 - 01/10 0700 In: 120 [P.O.:120] Out: 550 [Urine:550] Intake/Output this shift:    PE: General:alert and oriented, no complaints Heart:S1S2 RRR Lungs:clear without rales, rhonchi or wheezes  Abd:+ BS soft, non tender Ext:no edema    Lab Results:  [facility] 08/05/12 0550  WBC 6.9  HGB 13.3  HCT 38.6*  PLT 197   BMET  Basename 08/05/12 0550  NA 140  K 4.0  CL 102  CO2 28  GLUCOSE 133*  BUN 9  CREATININE 0.74  CALCIUM 9.0      Studies/Results: 1//9/14:  Successful left subclavian artery PTA and stenting using a Nitinol expanding stent for upper extremity claudication  Medications: I have reviewed the patient's current medications.    Marland Kitchen aspirin EC  325 mg Oral Daily  . atorvastatin  40 mg Oral q1800  . brimonidine  1 drop Both Eyes BID   And  . timolol  1 drop Both Eyes BID  . clopidogrel  75 mg Oral Q breakfast  . hydrALAZINE  10 mg Intravenous UD  . latanoprost  1 drop Both Eyes QHS  . niacin  500 mg Oral QHS  . pantoprazole  40 mg Oral Daily  . propylthiouracil  50 mg Oral Daily  . sotalol  60 mg Oral BID   Assessment/Plan: Principal Problem:  *PVD (peripheral vascular disease) with claudication, Lt Upper ext. pain, and known 80-90%distal Lt. subclavian artery stenosis  Active Problems:  S/P angioplasty with stent to Lt. subclavian artery -Nitrinol stent 08/04/12  CAD (coronary artery disease),Hx RCA stenting-patent 06/2012   PAF (paroxysmal atrial fibrillation), maintaining SR  Chronic  anticoagulation, coumadin for PAF  DM (diabetes mellitus)  Hyperlipidemia  PLAN: BP elevated, will resume lisinopril.  Hold metformin for 48 hours.  Resume coumadin as outpatient also low dose ASA, Plavix and plavix will be stopped in 1 month -per Dr. Hazle Coca note. Ambulate, d/c home once BP stable.  Has rec'd IV hydralazine several times.  LOS: 1 day   INGOLD,LAURA R 08/05/2012, 8:09 AM   I have seen and examined the patient along with INGOLD,LAURA R,NP.  I have reviewed the chart, notes and new data.  I agree with NP's note.  Key new complaints: none Key examination changes: small groin ecchymosis, BP in left arm is now higher than in the right: 160/70 vs 140/60 mm Hg Key new findings / data: stable renal function  PLAN: DC home today. Plavix + warfarin for a month. Increase lisinopril.  Thurmon Fair, MD, [facility] [phone] 08/05/2012, 9:54 AM

## 2012-08-05 NOTE — Discharge Instructions (Addendum)
Call [facility] and Vascular Center if any bleeding, swelling or drainage at cath site.  May shower, no tub baths for 48 hours for groin sticks.   Heart Healthy Diabetic diet  DO NOT TAKE METFORMIN UNTIL 08/07/12 -Sunday  It may interact with cath dye.  No work until the 16th of January  We increased  Your dose of lisinopril  Groin Site Care Refer to this sheet in the next few weeks. These instructions provide you with information on caring for yourself after your procedure. Your caregiver may also give you more specific instructions. Your treatment has been planned according to current medical practices, but problems sometimes occur. Call your caregiver if you have any problems or questions after your procedure. HOME CARE INSTRUCTIONS  You may shower 24 hours after the procedure. Remove the bandage (dressing) and gently wash the site with plain soap and water. Gently pat the site dry.  Do not apply powder or lotion to the site.  Do not sit in a bathtub, swimming pool, or whirlpool for 5 to 7 days.  No bending, squatting, or lifting anything over 10 pounds (4.5 kg) as directed by your caregiver.  Inspect the site at least twice daily.  Do not drive home if you are discharged the same day of the procedure. Have someone else drive you.  You may drive 24 hours after the procedure unless otherwise instructed by your caregiver. What to expect:  Any bruising will usually fade within 1 to 2 weeks.  Blood that collects in the tissue (hematoma) may be painful to the touch. It should usually decrease in size and tenderness within 1 to 2 weeks. SEEK IMMEDIATE MEDICAL CARE IF:  You have unusual pain at the groin site or down the affected leg.  You have redness, warmth, swelling, or pain at the groin site.  You have drainage (other than a small amount of blood on the dressing).  You have chills.  You have a fever or persistent symptoms for more than 72 hours.  You have a  fever and your symptoms suddenly get worse.  Your leg becomes pale, cool, tingly, or numb.  You have heavy bleeding from the site. Hold pressure on the site. Document Released: 08/15/2010 Document Revised: 10/05/2011 Document Reviewed: 08/15/2010 [facility] Patient Information 2013 [place], [place].

## 2012-08-15 ENCOUNTER — Encounter (HOSPITAL_COMMUNITY): Payer: 59

## 2012-08-17 ENCOUNTER — Encounter (HOSPITAL_COMMUNITY): Payer: 59

## 2012-08-18 ENCOUNTER — Other Ambulatory Visit (HOSPITAL_COMMUNITY): Payer: Self-pay | Admitting: Cardiology

## 2012-08-18 DIAGNOSIS — I771 Stricture of artery: Secondary | ICD-10-CM

## 2012-08-23 ENCOUNTER — Ambulatory Visit (HOSPITAL_COMMUNITY)
Admission: RE | Admit: 2012-08-23 | Discharge: 2012-08-23 | Disposition: A | Discharge status: Home / Self Care | Payer: 59 | Source: Ambulatory Visit | Attending: Cardiology | Admitting: Cardiology

## 2012-08-23 DIAGNOSIS — I771 Stricture of artery: Secondary | ICD-10-CM | POA: Insufficient documentation

## 2012-08-23 NOTE — Progress Notes (Signed)
Left upper ext arterial duplex completed post stent.  Shaun Mclaughlin

## 2012-10-13 ENCOUNTER — Ambulatory Visit: Payer: Self-pay | Admitting: Cardiovascular Disease

## 2012-10-13 DIAGNOSIS — Z7901 Long term (current) use of anticoagulants: Secondary | ICD-10-CM

## 2012-10-13 DIAGNOSIS — I480 Paroxysmal atrial fibrillation: Secondary | ICD-10-CM

## 2012-12-12 ENCOUNTER — Ambulatory Visit (INDEPENDENT_AMBULATORY_CARE_PROVIDER_SITE_OTHER): Payer: 59 | Admitting: Pharmacist Clinician (PhC)/ Clinical Pharmacy Specialist

## 2012-12-12 VITALS — BP 156/78 | HR 56

## 2012-12-12 DIAGNOSIS — I480 Paroxysmal atrial fibrillation: Secondary | ICD-10-CM

## 2012-12-12 DIAGNOSIS — Z7901 Long term (current) use of anticoagulants: Secondary | ICD-10-CM

## 2012-12-12 DIAGNOSIS — I4891 Unspecified atrial fibrillation: Secondary | ICD-10-CM

## 2012-12-12 LAB — POCT INR: INR: 1.8

## 2013-01-09 ENCOUNTER — Ambulatory Visit (INDEPENDENT_AMBULATORY_CARE_PROVIDER_SITE_OTHER): Payer: 59 | Admitting: Pharmacist Clinician (PhC)/ Clinical Pharmacy Specialist

## 2013-01-09 VITALS — BP 132/70 | HR 52

## 2013-01-09 DIAGNOSIS — I4891 Unspecified atrial fibrillation: Secondary | ICD-10-CM

## 2013-01-09 DIAGNOSIS — I480 Paroxysmal atrial fibrillation: Secondary | ICD-10-CM

## 2013-01-09 DIAGNOSIS — I771 Stricture of artery: Secondary | ICD-10-CM

## 2013-01-09 DIAGNOSIS — Z7901 Long term (current) use of anticoagulants: Secondary | ICD-10-CM

## 2013-01-09 LAB — POCT INR: INR: 1.9

## 2013-02-06 ENCOUNTER — Ambulatory Visit: Payer: 59 | Admitting: Pharmacist Clinician (PhC)/ Clinical Pharmacy Specialist

## 2013-02-07 ENCOUNTER — Ambulatory Visit (INDEPENDENT_AMBULATORY_CARE_PROVIDER_SITE_OTHER): Payer: 59 | Admitting: Pharmacist Clinician (PhC)/ Clinical Pharmacy Specialist

## 2013-02-07 VITALS — BP 130/72 | HR 56

## 2013-02-07 DIAGNOSIS — I4891 Unspecified atrial fibrillation: Secondary | ICD-10-CM

## 2013-02-07 DIAGNOSIS — Z7901 Long term (current) use of anticoagulants: Secondary | ICD-10-CM

## 2013-02-07 DIAGNOSIS — I480 Paroxysmal atrial fibrillation: Secondary | ICD-10-CM

## 2013-02-07 LAB — POCT INR: INR: 2.1

## 2013-03-15 ENCOUNTER — Ambulatory Visit: Payer: 59 | Admitting: Cardiovascular Disease

## 2013-03-15 ENCOUNTER — Ambulatory Visit: Payer: 59 | Admitting: Pharmacist Clinician (PhC)/ Clinical Pharmacy Specialist

## 2013-03-21 ENCOUNTER — Encounter: Payer: Self-pay | Admitting: Cardiology

## 2013-03-24 ENCOUNTER — Encounter: Payer: Self-pay | Admitting: Cardiovascular Disease

## 2013-03-28 ENCOUNTER — Ambulatory Visit: Payer: 59 | Admitting: Pharmacist Clinician (PhC)/ Clinical Pharmacy Specialist

## 2013-03-28 ENCOUNTER — Ambulatory Visit: Payer: 59 | Admitting: Cardiovascular Disease

## 2013-03-31 ENCOUNTER — Other Ambulatory Visit: Payer: Self-pay | Admitting: Pharmacist Clinician (PhC)/ Clinical Pharmacy Specialist

## 2013-04-05 ENCOUNTER — Ambulatory Visit (INDEPENDENT_AMBULATORY_CARE_PROVIDER_SITE_OTHER): Payer: Medicare Other | Admitting: Pharmacist Clinician (PhC)/ Clinical Pharmacy Specialist

## 2013-04-05 ENCOUNTER — Ambulatory Visit (INDEPENDENT_AMBULATORY_CARE_PROVIDER_SITE_OTHER): Payer: Medicare Other | Admitting: Cardiovascular Disease

## 2013-04-05 ENCOUNTER — Encounter: Payer: Self-pay | Admitting: Cardiovascular Disease

## 2013-04-05 VITALS — BP 132/80 | HR 51 | Resp 16 | Ht 67.5 in | Wt 166.4 lb

## 2013-04-05 DIAGNOSIS — I480 Paroxysmal atrial fibrillation: Secondary | ICD-10-CM

## 2013-04-05 DIAGNOSIS — I4891 Unspecified atrial fibrillation: Secondary | ICD-10-CM

## 2013-04-05 DIAGNOSIS — E119 Type 2 diabetes mellitus without complications: Secondary | ICD-10-CM

## 2013-04-05 DIAGNOSIS — E785 Hyperlipidemia, unspecified: Secondary | ICD-10-CM

## 2013-04-05 DIAGNOSIS — I739 Peripheral vascular disease, unspecified: Secondary | ICD-10-CM

## 2013-04-05 DIAGNOSIS — Z7901 Long term (current) use of anticoagulants: Secondary | ICD-10-CM

## 2013-04-05 DIAGNOSIS — I10 Essential (primary) hypertension: Secondary | ICD-10-CM

## 2013-04-05 DIAGNOSIS — I2510 Atherosclerotic heart disease of native coronary artery without angina pectoris: Secondary | ICD-10-CM

## 2013-04-05 MED ORDER — ATORVASTATIN CALCIUM 40 MG PO TABS: 40.0000 mg | ORAL_TABLET | Freq: Every day | ORAL | Status: DC

## 2013-04-05 MED ORDER — LISINOPRIL 20 MG PO TABS: 20.0000 mg | ORAL_TABLET | Freq: Every day | ORAL | Status: DC

## 2013-04-05 MED ORDER — WARFARIN SODIUM 5 MG PO TABS: ORAL_TABLET | ORAL | Status: DC

## 2013-04-05 NOTE — Patient Instructions (Addendum)
Your physician recommends that you schedule a follow-up appointment in: 6 months Your physician has recommended you make the following change in your medication: stop niacin

## 2013-04-11 NOTE — Progress Notes (Signed)
Patient ID: Shaun Mclaughlin, male   DOB: 1935-04-18, 78 y.o.   MRN: [id number]     Reason for office visit Followup CAD, PVD, paroxysmal atrial fibrillation  Shaun Mclaughlin is doing very well. He has lost another 13 pounds since his visit with Dr. Allyson Mclaughlin in January and has lost about 20 pounds over the last year. He underwent placement of a stent for symptomatic left subclavian artery stenosis and has improved symptoms. He is much more active. He is eating a healthy diet and is walking daily. He feels absolutely great. Blood pressure and blood sugar checks at home have all been normal.    No Known Allergies  Current Outpatient Prescriptions  Medication Sig Dispense Refill  . Ascorbic Acid (VITAMIN C WITH ROSE HIPS) 1000 MG tablet Take 1,000 mg by mouth daily.      Marland Kitchen aspirin EC 81 MG tablet Take 81 mg by mouth daily.      Marland Kitchen atorvastatin (LIPITOR) 40 MG tablet Take 1 tablet (40 mg total) by mouth daily.  30 tablet  11  . beta carotene w/minerals (OCUVITE) tablet Take 1 tablet by mouth daily.      Marland Kitchen latanoprost (XALATAN) 0.005 % ophthalmic solution Place 1 drop into both eyes at bedtime.      Marland Kitchen lisinopril (PRINIVIL,ZESTRIL) 20 MG tablet Take 1 tablet (20 mg total) by mouth daily.  30 tablet  11  . metFORMIN (GLUCOPHAGE-XR) 500 MG 24 hr tablet Take 500 mg by mouth daily with breakfast.      . Multiple Vitamin (MULTIVITAMIN WITH MINERALS) TABS Take 1 tablet by mouth daily.      Marland Kitchen omeprazole (PRILOSEC) 20 MG capsule Take 20 mg by mouth daily.      Marland Kitchen propylthiouracil (PTU) 50 MG tablet Take 50 mg by mouth daily.      . sodium chloride (OCEAN) 0.65 % SOLN nasal spray Place 1 spray into the nose as needed for congestion.      . sotalol (BETAPACE) 120 MG tablet Take 60 mg by mouth 2 (two) times daily.      . niacin (NIASPAN) 500 MG CR tablet Take 500 mg by mouth at bedtime.      Marland Kitchen warfarin (COUMADIN) 5 MG tablet Take 1 to 1 &1/2 tablets by mouth daily as directed  40 tablet  6   No current  facility-administered medications for this visit.    Past Medical History  Diagnosis Date  . Hypercholesteremia   . Pneumonia ~ 2008  . Chronic bronchitis     "about q yr" (08/04/2012)  . Hyperthyroidism   . Type II diabetes mellitus     "take RX; I'm prediabetic; don't have to  check my CBG qd; keep my weight down" (08/04/2012)  . GERD (gastroesophageal reflux disease)   . External bleeding hemorrhoids ~ 2003; 2008; 2013    "removed polyps and no bleeding since" (08/04/2012)  . Arthritis     "maybe a little bit in some joints" (08/04/2012)  . Kidney stone 1980's  . PVD (peripheral vascular disease) with claudication, Lt Upper ext. pain, and known 80-90%distal Lt. subclavian artery stenosis  08/05/2012  . CAD (coronary artery disease),Hx RCA stenting-patent 06/2012  08/05/2012  . PAF (paroxysmal atrial fibrillation), maintaining SR 08/05/2012  . Chronic anticoagulation, coumadin for PAF 08/05/2012  . DM (diabetes mellitus) 08/05/2012  . Hyperlipidemia 08/05/2012  . S/P angioplasty with stent to Lt. subclavian artery -Nitrinol stent 08/04/12 08/05/2012  . HTN (hypertension) 08/05/2012    Past Surgical History  Procedure Laterality Date  . Coronary angioplasty with stent placement  2003    patent 12/13  . Cardiac catheterization  2008 and Dec 2013    patent coronaries  . Tonsillectomy  1940's  . Hernia repair  2000's    "umbilical" (08/04/2012)  . Inguinal hernia repair  2000's    "right" (08/04/2012)  . Cataract extraction w/ intraocular lens implant      "right eye" (08/04/2012)  . Glaucoma surgery  2000's    "right eye; had laser procedure  to lower the pressure and prevent glaucoma" (08/04/2012)  . Lithotripsy  1980's    "imploded in my kidney; ended up having to be cut open in my back" (08/04/2012)  . Kidney stone surgery  1980's  . Subclavian stent placement  Jan 2014    Lt SCA    Family History  Problem Relation Age of Onset  . Stroke Mother 44  . Coronary artery disease Father 39     History   Social History  . Marital Status: Married    Spouse Name: N/A    Number of Children: N/A  . Years of Education: N/A   Occupational History  . Not on file.   Social History Main Topics  . Smoking status: Current Every Day Smoker -- 0.12 packs/day for 25 years    Types: Cigarettes, Pipe  . Smokeless tobacco: Never Used     Comment: 08/04/2012 "still smoke a cigar q once in awhile"  . Alcohol Use: Yes     Comment: 08/04/2012 "once/month I might have a drink (beer, wine, or margarita)  . Drug Use: No  . Sexual Activity: No   Other Topics Concern  . Not on file   Social History Narrative  . No narrative on file    Review of systems: The patient specifically denies any chest pain at rest or with exertion, dyspnea at rest or with exertion, orthopnea, paroxysmal nocturnal dyspnea, syncope, palpitations, focal neurological deficits, intermittent claudication, lower extremity edema, unexplained weight gain, cough, hemoptysis or wheezing.  The patient also denies abdominal pain, nausea, vomiting, dysphagia, diarrhea, constipation, polyuria, polydipsia, dysuria, hematuria, frequency, urgency, abnormal bleeding or bruising, fever, chills, unexpected weight changes, mood swings, change in skin or hair texture, change in voice quality, auditory or visual problems, allergic reactions or rashes, new musculoskeletal complaints other than usual "aches and pains".   PHYSICAL EXAM BP 132/80  Pulse 51  Resp 16  Ht 5' 7.5" (1.715 m)  Wt 166 lb 6.4 oz (75.479 kg)  BMI 25.66 kg/m2 Equal blood pressure recordings in the right and left upper extremity General: Alert, oriented x3, no distress Head: no evidence of trauma, PERRL, EOMI, no exophtalmos or lid lag, no myxedema, no xanthelasma; normal ears, nose and oropharynx Neck: normal jugular venous pulsations and no hepatojugular reflux; brisk carotid pulses without delay and no carotid bruits Chest: clear to auscultation, no signs of  consolidation by percussion or palpation, normal fremitus, symmetrical and full respiratory excursions Cardiovascular: normal position and quality of the apical impulse, regular rhythm, normal first and second heart sounds, no murmurs, rubs or gallops Abdomen: no tenderness or distention, no masses by palpation, no abnormal pulsatility or arterial bruits, normal bowel sounds, no hepatosplenomegaly Extremities: no clubbing, cyanosis or edema; 2+ radial, ulnar and brachial pulses bilaterally; 2+ right femoral, posterior tibial and dorsalis pedis pulses; 2+ left femoral, posterior tibial and dorsalis pedis pulses; no subclavian or femoral bruits Neurological: grossly nonfocal   EKG: Mild sinus bradycardia, minimally prolonged  QTC (473 ms) consistent with sotalol effect  Lipid Panel  No results found for this basename: chol, trig, hdl, cholhdl, vldl, ldlcalc    BMET    Component Value Date/Time   NA 140 08/05/2012 0550   K 4.0 08/05/2012 0550   CL 102 08/05/2012 0550   CO2 28 08/05/2012 0550   GLUCOSE 133* 08/05/2012 0550   BUN 9 08/05/2012 0550   CREATININE 0.74 08/05/2012 0550   CALCIUM 9.0 08/05/2012 0550   GFRNONAA 88* 08/05/2012 0550   GFRAA >90 08/05/2012 0550     ASSESSMENT AND PLAN CAD,Hx RCA stent Jan '03. patent at cath 06/2012  No symptoms of angina. Preserved left ventricular systolic function. Recent cath showed widely patent stent. Focus on long-term coronary risk factor modification.  PAF, maintaining SR on Beta Pace No recent symptomatic events. Tolerating sotalol well. QTc interval not to excessively prolonged. No bleeding complications on warfarin anticoagulation.  DM (diabetes mellitus) On metformin monotherapy. He has lost another 13 pounds since January. Suspect glycemic control is even better. Niacin may actually worsen his glycemia and since the recent clinical trials have been discouraging we have discussed discontinuing this medication.  Hyperlipidemia He is on a  high dose of an active statin. He has implemented positive lifestyle changes. He is physically active and fit and is now healthy weight. We'll continue on statin monotherapy.  PVD - s/p LSCA stent 08/04/12 Cold blood pressures in the right left upper extremity suggest a widely patent stent He states that he didn't even realize how much his left arm is bothering him until after the stent was placed and he had greatly improved stamina in his left upper extremity. No complaints. Followup per Dr. Allyson Mclaughlin.  HTN (hypertension) Excellent control  Orders Placed This Encounter  Procedures  . EKG 12-Lead   Meds ordered this encounter  Medications  . atorvastatin (LIPITOR) 40 MG tablet    Sig: Take 1 tablet (40 mg total) by mouth daily.    Dispense:  30 tablet    Refill:  11  . lisinopril (PRINIVIL,ZESTRIL) 20 MG tablet    Sig: Take 1 tablet (20 mg total) by mouth daily.    Dispense:  30 tablet    Refill:  11    Order Specific Question:  Supervising Provider    Answer:  Nicki Guadalajara A [[id number]]    Shaun Silk, MD, [facility] [phone] office [phone] pager  22

## 2013-04-11 NOTE — Assessment & Plan Note (Signed)
He is on a high dose of an active statin. He has implemented positive lifestyle changes. He is physically active and fit and is now healthy weight. We'll continue on statin monotherapy.

## 2013-04-11 NOTE — Assessment & Plan Note (Signed)
No recent symptomatic events. Tolerating sotalol well. QTc interval not to excessively prolonged. No bleeding complications on warfarin anticoagulation.

## 2013-04-11 NOTE — Assessment & Plan Note (Signed)
On metformin monotherapy. He has lost another 13 pounds since January. Suspect glycemic control is even better. Niacin may actually worsen his glycemia and since the recent clinical trials have been discouraging we have discussed discontinuing this medication.

## 2013-04-11 NOTE — Assessment & Plan Note (Signed)
No symptoms of angina. Preserved left ventricular systolic function. Recent cath showed widely patent stent. Focus on long-term coronary risk factor modification.

## 2013-04-11 NOTE — Assessment & Plan Note (Signed)
Cold blood pressures in the right left upper extremity suggest a widely patent stent He states that he didn't even realize how much his left arm is bothering him until after the stent was placed and he had greatly improved stamina in his left upper extremity. No complaints. Followup per Dr. Allyson Sabal.

## 2013-04-11 NOTE — Assessment & Plan Note (Signed)
Excellent control.   

## 2013-04-28 ENCOUNTER — Ambulatory Visit (INDEPENDENT_AMBULATORY_CARE_PROVIDER_SITE_OTHER): Payer: Medicare Other | Admitting: Pharmacist Clinician (PhC)/ Clinical Pharmacy Specialist

## 2013-04-28 VITALS — BP 162/70 | HR 52

## 2013-04-28 DIAGNOSIS — Z7901 Long term (current) use of anticoagulants: Secondary | ICD-10-CM

## 2013-04-28 DIAGNOSIS — I4891 Unspecified atrial fibrillation: Secondary | ICD-10-CM

## 2013-04-28 DIAGNOSIS — I480 Paroxysmal atrial fibrillation: Secondary | ICD-10-CM

## 2013-04-28 LAB — POCT INR: INR: 1.9

## 2013-05-08 ENCOUNTER — Other Ambulatory Visit: Payer: Self-pay | Admitting: *Deleted

## 2013-05-08 MED ORDER — SOTALOL HCL 120 MG PO TABS: 60.0000 mg | ORAL_TABLET | Freq: Two times a day (BID) | ORAL | Status: DC

## 2013-05-08 NOTE — Telephone Encounter (Signed)
Rx was sent to pharmacy electronically. 

## 2013-05-19 ENCOUNTER — Ambulatory Visit (INDEPENDENT_AMBULATORY_CARE_PROVIDER_SITE_OTHER): Payer: Medicare Other | Admitting: Pharmacist Clinician (PhC)/ Clinical Pharmacy Specialist

## 2013-05-19 VITALS — BP 114/60 | HR 56

## 2013-05-19 DIAGNOSIS — I4891 Unspecified atrial fibrillation: Secondary | ICD-10-CM

## 2013-05-19 DIAGNOSIS — Z7901 Long term (current) use of anticoagulants: Secondary | ICD-10-CM

## 2013-05-19 DIAGNOSIS — I480 Paroxysmal atrial fibrillation: Secondary | ICD-10-CM

## 2013-06-09 ENCOUNTER — Ambulatory Visit (INDEPENDENT_AMBULATORY_CARE_PROVIDER_SITE_OTHER): Payer: Medicare Other | Admitting: Pharmacist Clinician (PhC)/ Clinical Pharmacy Specialist

## 2013-06-09 VITALS — BP 156/80 | HR 56

## 2013-06-09 DIAGNOSIS — Z7901 Long term (current) use of anticoagulants: Secondary | ICD-10-CM

## 2013-06-09 DIAGNOSIS — I480 Paroxysmal atrial fibrillation: Secondary | ICD-10-CM

## 2013-06-09 DIAGNOSIS — I4891 Unspecified atrial fibrillation: Secondary | ICD-10-CM

## 2013-06-09 LAB — POCT INR: INR: 2.4

## 2013-07-05 ENCOUNTER — Telehealth (HOSPITAL_COMMUNITY): Payer: Self-pay | Admitting: *Deleted

## 2013-07-05 ENCOUNTER — Other Ambulatory Visit (HOSPITAL_COMMUNITY): Payer: Self-pay | Admitting: *Deleted

## 2013-07-07 ENCOUNTER — Ambulatory Visit (INDEPENDENT_AMBULATORY_CARE_PROVIDER_SITE_OTHER): Payer: Medicare Other | Admitting: Pharmacist Clinician (PhC)/ Clinical Pharmacy Specialist

## 2013-07-07 VITALS — BP 144/80 | HR 56

## 2013-07-07 DIAGNOSIS — Z7901 Long term (current) use of anticoagulants: Secondary | ICD-10-CM

## 2013-07-07 DIAGNOSIS — I480 Paroxysmal atrial fibrillation: Secondary | ICD-10-CM

## 2013-07-07 DIAGNOSIS — I4891 Unspecified atrial fibrillation: Secondary | ICD-10-CM

## 2013-07-07 LAB — POCT INR: INR: 2.3

## 2013-08-18 ENCOUNTER — Ambulatory Visit (INDEPENDENT_AMBULATORY_CARE_PROVIDER_SITE_OTHER): Payer: Medicare Other | Admitting: Pharmacist Clinician (PhC)/ Clinical Pharmacy Specialist

## 2013-08-18 VITALS — BP 144/76 | HR 60

## 2013-08-18 DIAGNOSIS — Z7901 Long term (current) use of anticoagulants: Secondary | ICD-10-CM

## 2013-08-18 DIAGNOSIS — I480 Paroxysmal atrial fibrillation: Secondary | ICD-10-CM

## 2013-08-18 DIAGNOSIS — I4891 Unspecified atrial fibrillation: Secondary | ICD-10-CM

## 2013-08-18 LAB — POCT INR
INR: 3.7
INR: 3.7

## 2013-08-31 ENCOUNTER — Telehealth (HOSPITAL_COMMUNITY): Payer: Self-pay | Admitting: *Deleted

## 2013-09-01 ENCOUNTER — Telehealth (HOSPITAL_COMMUNITY): Payer: Self-pay | Admitting: *Deleted

## 2013-09-05 ENCOUNTER — Telehealth (HOSPITAL_COMMUNITY): Payer: Self-pay | Admitting: *Deleted

## 2013-09-08 ENCOUNTER — Ambulatory Visit (INDEPENDENT_AMBULATORY_CARE_PROVIDER_SITE_OTHER): Payer: Medicare Other | Admitting: Pharmacist Clinician (PhC)/ Clinical Pharmacy Specialist

## 2013-09-08 ENCOUNTER — Other Ambulatory Visit (HOSPITAL_COMMUNITY): Payer: Self-pay | Admitting: Cardiovascular Disease

## 2013-09-08 VITALS — BP 150/82 | HR 56

## 2013-09-08 DIAGNOSIS — Z7901 Long term (current) use of anticoagulants: Secondary | ICD-10-CM

## 2013-09-08 DIAGNOSIS — I6529 Occlusion and stenosis of unspecified carotid artery: Secondary | ICD-10-CM

## 2013-09-08 DIAGNOSIS — I480 Paroxysmal atrial fibrillation: Secondary | ICD-10-CM

## 2013-09-08 DIAGNOSIS — I4891 Unspecified atrial fibrillation: Secondary | ICD-10-CM

## 2013-09-08 LAB — POCT INR: INR: 3

## 2013-09-08 MED ORDER — WARFARIN SODIUM 5 MG PO TABS: ORAL_TABLET | ORAL | Status: DC

## 2013-09-15 ENCOUNTER — Ambulatory Visit (HOSPITAL_COMMUNITY)
Admission: RE | Admit: 2013-09-15 | Discharge: 2013-09-15 | Disposition: A | Discharge status: Home / Self Care | Payer: Medicare Other | Source: Ambulatory Visit | Attending: Internal Medicine | Admitting: Internal Medicine

## 2013-09-15 DIAGNOSIS — I6529 Occlusion and stenosis of unspecified carotid artery: Secondary | ICD-10-CM

## 2013-09-15 DIAGNOSIS — I672 Cerebral atherosclerosis: Secondary | ICD-10-CM | POA: Insufficient documentation

## 2013-09-15 NOTE — Progress Notes (Signed)
Carotid Duplex Completed. Avelina Mcclurkin, BS, RDMS, RVT  

## 2013-09-28 ENCOUNTER — Telehealth: Payer: Self-pay | Admitting: *Deleted

## 2013-09-28 DIAGNOSIS — I6529 Occlusion and stenosis of unspecified carotid artery: Secondary | ICD-10-CM

## 2013-09-28 NOTE — Telephone Encounter (Signed)
Message copied by Tressa Busman on Thu Sep 28, 2013  1:59 PM ------      Message from: Sanda Klein      Created: Thu Sep 28, 2013  8:15 AM       Carotids only mildly changed from last year, stenosis remains mild. Recheck in 1 year ------

## 2013-09-28 NOTE — Telephone Encounter (Signed)
Carotid doppler results called to patient.  Voiced understanding.  Repeat in one year.  Order placed.

## 2013-10-06 ENCOUNTER — Ambulatory Visit (INDEPENDENT_AMBULATORY_CARE_PROVIDER_SITE_OTHER): Payer: Medicare Other | Admitting: Pharmacist Clinician (PhC)/ Clinical Pharmacy Specialist

## 2013-10-06 VITALS — BP 148/70 | HR 56

## 2013-10-06 DIAGNOSIS — Z7901 Long term (current) use of anticoagulants: Secondary | ICD-10-CM

## 2013-10-06 DIAGNOSIS — I480 Paroxysmal atrial fibrillation: Secondary | ICD-10-CM

## 2013-10-06 DIAGNOSIS — I4891 Unspecified atrial fibrillation: Secondary | ICD-10-CM

## 2013-10-06 DIAGNOSIS — I6529 Occlusion and stenosis of unspecified carotid artery: Secondary | ICD-10-CM

## 2013-10-06 LAB — POCT INR: INR: 2.6

## 2013-11-03 ENCOUNTER — Ambulatory Visit (INDEPENDENT_AMBULATORY_CARE_PROVIDER_SITE_OTHER): Payer: Medicare Other | Admitting: Pharmacist Clinician (PhC)/ Clinical Pharmacy Specialist

## 2013-11-03 VITALS — BP 184/88 | HR 76

## 2013-11-03 DIAGNOSIS — Z7901 Long term (current) use of anticoagulants: Secondary | ICD-10-CM

## 2013-11-03 DIAGNOSIS — I480 Paroxysmal atrial fibrillation: Secondary | ICD-10-CM

## 2013-11-03 DIAGNOSIS — I4891 Unspecified atrial fibrillation: Secondary | ICD-10-CM

## 2013-11-03 LAB — POCT INR: INR: 4.2

## 2013-11-10 ENCOUNTER — Ambulatory Visit (INDEPENDENT_AMBULATORY_CARE_PROVIDER_SITE_OTHER): Payer: Medicare Other | Admitting: Cardiovascular Disease

## 2013-11-10 ENCOUNTER — Encounter: Payer: Self-pay | Admitting: Cardiovascular Disease

## 2013-11-10 VITALS — BP 152/82 | HR 106 | Resp 16 | Ht 67.0 in | Wt 169.5 lb

## 2013-11-10 DIAGNOSIS — I480 Paroxysmal atrial fibrillation: Secondary | ICD-10-CM

## 2013-11-10 DIAGNOSIS — I4891 Unspecified atrial fibrillation: Secondary | ICD-10-CM

## 2013-11-10 DIAGNOSIS — I739 Peripheral vascular disease, unspecified: Secondary | ICD-10-CM

## 2013-11-10 DIAGNOSIS — I10 Essential (primary) hypertension: Secondary | ICD-10-CM

## 2013-11-10 NOTE — Patient Instructions (Signed)
Your physician recommends that you schedule a follow-up appointment in: 6 months with Dr. Sallyanne Kuster.  INR scheduled for 11/24/13 - you are to have an EKG done at that time.

## 2013-11-12 ENCOUNTER — Encounter: Payer: Self-pay | Admitting: Cardiovascular Disease

## 2013-11-12 NOTE — Assessment & Plan Note (Signed)
Recheck blood pressure was much better. Discussed the fact that artifact blood pressure cuffs can be quite erroneous in irregular rhythm slightly true fibrillation

## 2013-11-12 NOTE — Assessment & Plan Note (Signed)
He is not symptomatic with recurrent arrhythmia and his ventricular rate is only mildly fast. When in sinus rhythm he is usually bradycardic in the 50s. I made no adjustments to his medication today but will repeat an electrocardiogram when he returns for his prothrombin time. May eventually need a dual-chamber pacemaker if he develops full-blown tachycardia-bradycardia syndrome and a symptomatic

## 2013-11-12 NOTE — Assessment & Plan Note (Signed)
Asymptomatic. Equal blood pressures in left and right upper extremity

## 2013-11-12 NOTE — Progress Notes (Signed)
Patient ID: Shaun Mclaughlin, male   DOB: 03-Jun-1935, 79 y.o.   MRN: [id number]      Reason for office visit Atrial fibrillation, CAD, PAD  Shaun Mclaughlin is feeling quite well. He denies any complaints of chest pain, shortness of breath, lower extremity edema or dizziness. He had labs recently performed and Dr. Laqueta Mclaughlin office. We don't have the report but he remembers that his hemoglobin A1c was 6.7% and his total cholesterol was only 100. He has no complaints related to his left arm and has equal blood pressures in the left and right upper extremity. Today he is in atrial fibrillation but is oblivious to the arrhythmia. His ventricular rate is a little high 106.  He has paroxysmal atrial fibrillation and takes sotalol and warfarin. He does not have a history of stroke or TIA or other embolic event and has not had bleeding complications. He has coronary artery disease and previously underwent placement of a stent to the right coronary artery in 2003, or coronaries patent by cardiac catheterization in 2013. He has hypertension, diabetes mellitus and hyperlipidemia all of which are well compensated usually. He has peripheral arterial disease and received a stent to the distal left subclavian artery in January 2014 for left arm claudication (10 x 40 mm Cordis Smart nitinol). He is also known to have a 50-60% left renal artery stenosis.     No Known Allergies  Current Outpatient Prescriptions  Medication Sig Dispense Refill  . Ascorbic Acid (VITAMIN C WITH ROSE HIPS) 1000 MG tablet Take 1,000 mg by mouth daily.      Marland Kitchen aspirin EC 81 MG tablet Take 81 mg by mouth daily.      Marland Kitchen atorvastatin (LIPITOR) 40 MG tablet Take 1 tablet (40 mg total) by mouth daily.  30 tablet  11  . KRILL OIL PO Take by mouth daily.      Marland Kitchen latanoprost (XALATAN) 0.005 % ophthalmic solution Place 1 drop into both eyes at bedtime.      Marland Kitchen lisinopril (PRINIVIL,ZESTRIL) 20 MG tablet Take 1 tablet (20 mg total) by mouth daily.  30 tablet  11    . loratadine (CLARITIN) 10 MG tablet Take 10 mg by mouth daily.      . metFORMIN (GLUCOPHAGE-XR) 500 MG 24 hr tablet Take 500 mg by mouth daily with breakfast.      . Multiple Vitamin (MULTIVITAMIN WITH MINERALS) TABS Take 1 tablet by mouth daily.      Marland Kitchen omeprazole (PRILOSEC) 20 MG capsule Take 20 mg by mouth daily.      Marland Kitchen propylthiouracil (PTU) 50 MG tablet Take 50 mg by mouth daily.      . sotalol (BETAPACE) 120 MG tablet Take 0.5 tablets (60 mg total) by mouth 2 (two) times daily.  30 tablet  11  . warfarin (COUMADIN) 5 MG tablet Take 1 to 1 &1/2 tablets by mouth daily as directed  153 tablet  1   No current facility-administered medications for this visit.    Past Medical History  Diagnosis Date  . Hypercholesteremia   . Pneumonia ~ 2008  . Chronic bronchitis     "about q yr" (08/04/2012)  . Hyperthyroidism   . Type II diabetes mellitus     "take RX; I'm prediabetic; don't have to  check my CBG qd; keep my weight down" (08/04/2012)  . GERD (gastroesophageal reflux disease)   . External bleeding hemorrhoids ~ 2003; 2008; 2013    "removed polyps and no bleeding since" (08/04/2012)  .  Arthritis     "maybe a little bit in some joints" (08/04/2012)  . Kidney stone 1980's  . PVD (peripheral vascular disease) with claudication, Lt Upper ext. pain, and known 80-90%distal Lt. subclavian artery stenosis  08/05/2012  . CAD (coronary artery disease),Hx RCA stenting-patent 06/2012  08/05/2012  . PAF (paroxysmal atrial fibrillation), maintaining SR 08/05/2012  . Chronic anticoagulation, coumadin for PAF 08/05/2012  . DM (diabetes mellitus) 08/05/2012  . Hyperlipidemia 08/05/2012  . S/P angioplasty with stent to Lt. subclavian artery -Nitrinol stent 08/04/12 08/05/2012  . HTN (hypertension) 08/05/2012    Past Surgical History  Procedure Laterality Date  . Coronary angioplasty with stent placement  2003    patent 12/13  . Cardiac catheterization  2008 and Dec 2013    patent coronaries  . Tonsillectomy   1940's  . Hernia repair  1443'X    "umbilical" (11/27/84)  . Inguinal hernia repair  2000's    "right" (08/04/2012)  . Cataract extraction w/ intraocular lens implant      "right eye" (08/04/2012)  . Glaucoma surgery  2000's    "right eye; had laser procedure  to lower the pressure and prevent glaucoma" (08/04/2012)  . Lithotripsy  1980's    "imploded in my kidney; ended up having to be cut open in my back" (08/04/2012)  . Kidney stone surgery  1980's  . Subclavian stent placement  Jan 2014    Lt SCA    Family History  Problem Relation Age of Onset  . Stroke Mother 9  . Coronary artery disease Father 44    History   Social History  . Marital Status: Married    Spouse Name: N/A    Number of Children: N/A  . Years of Education: N/A   Occupational History  . Not on file.   Social History Main Topics  . Smoking status: Current Every Day Smoker -- 0.12 packs/day for 25 years    Types: Cigarettes, Pipe  . Smokeless tobacco: Never Used     Comment: 08/04/2012 "still smoke a cigar q once in awhile"  . Alcohol Use: Yes     Comment: 08/04/2012 "once/month I might have a drink (beer, wine, or margarita)  . Drug Use: No  . Sexual Activity: No   Other Topics Concern  . Not on file   Social History Narrative  . No narrative on file    Review of systems: The patient specifically denies any chest pain at rest or with exertion, dyspnea at rest or with exertion, orthopnea, paroxysmal nocturnal dyspnea, syncope, palpitations, focal neurological deficits, intermittent claudication, lower extremity edema, unexplained weight gain, cough, hemoptysis or wheezing.  The patient also denies abdominal pain, nausea, vomiting, dysphagia, diarrhea, constipation, polyuria, polydipsia, dysuria, hematuria, frequency, urgency, abnormal bleeding or bruising, fever, chills, unexpected weight changes, mood swings, change in skin or hair texture, change in voice quality, auditory or visual problems, allergic  reactions or rashes, new musculoskeletal complaints other than usual "aches and pains".   PHYSICAL EXAM BP 152/82  Pulse 106  Resp 16  Ht 5\' 7"  (1.702 m)  Wt 169 lb 8 oz (76.885 kg)  BMI 26.54 kg/m2 Recheck 132/77 mm Hg. General: Alert, oriented x3, no distress Head: no evidence of trauma, PERRL, EOMI, no exophtalmos or lid lag, no myxedema, no xanthelasma; normal ears, nose and oropharynx Neck: normal jugular venous pulsations and no hepatojugular reflux; brisk carotid pulses without delay and no carotid bruits Chest: clear to auscultation, no signs of consolidation by percussion or palpation,  normal fremitus, symmetrical and full respiratory excursions Cardiovascular: normal position and quality of the apical impulse, irregular rhythm, normal first and second heart sounds, no murmurs, rubs or gallops Abdomen: no tenderness or distention, no masses by palpation, no abnormal pulsatility or arterial bruits, normal bowel sounds, no hepatosplenomegaly Extremities: no clubbing, cyanosis or edema; 2+ radial, ulnar and brachial pulses bilaterally; 2+ right femoral, posterior tibial and dorsalis pedis pulses; 2+ left femoral, posterior tibial and dorsalis pedis pulses; no subclavian or femoral bruits Neurological: grossly nonfocal   EKG: Showed atrial fibrillation with rapid ventricular response and aberrantly conducted beats. QTC 470 ms  Lipid Panel  Dr. Laqueta Mclaughlin  BMET    Component Value Date/Time   NA 140 08/05/2012 0550   K 4.0 08/05/2012 0550   CL 102 08/05/2012 0550   CO2 28 08/05/2012 0550   GLUCOSE 133* 08/05/2012 0550   BUN 9 08/05/2012 0550   CREATININE 0.74 08/05/2012 0550   CALCIUM 9.0 08/05/2012 0550   GFRNONAA 88* 08/05/2012 0550   GFRAA >90 08/05/2012 0550     ASSESSMENT AND PLAN PAF, maintaining SR on Beta Pace He is not symptomatic with recurrent arrhythmia and his ventricular rate is only mildly fast. When in sinus rhythm he is usually bradycardic in the 50s. I made no  adjustments to his medication today but will repeat an electrocardiogram when he returns for his prothrombin time. May eventually need a dual-chamber pacemaker if he develops full-blown tachycardia-bradycardia syndrome and a symptomatic  PVD - s/p LSCA stent 08/04/12 Asymptomatic. Equal blood pressures in left and right upper extremity  HTN (hypertension) Recheck blood pressure was much better. Discussed the fact that artifact blood pressure cuffs can be quite erroneous in irregular rhythm slightly true fibrillation   Patient Instructions  Your physician recommends that you schedule a follow-up appointment in: 6 months with Dr. Sallyanne Kuster.  INR scheduled for 11/24/13 - you are to have an EKG done at that time.     Orders Placed This Encounter  Procedures  . EKG 12-Lead   Meds ordered this encounter  Medications  . KRILL OIL PO    Sig: Take by mouth daily.    Katsumi Wisler  Sanda Klein, MD, [facility] [phone] office [phone] pager

## 2013-11-24 ENCOUNTER — Ambulatory Visit (INDEPENDENT_AMBULATORY_CARE_PROVIDER_SITE_OTHER): Payer: Medicare Other | Admitting: *Deleted

## 2013-11-24 ENCOUNTER — Ambulatory Visit (INDEPENDENT_AMBULATORY_CARE_PROVIDER_SITE_OTHER): Payer: Medicare Other | Admitting: Pharmacist Clinician (PhC)/ Clinical Pharmacy Specialist

## 2013-11-24 DIAGNOSIS — I4891 Unspecified atrial fibrillation: Secondary | ICD-10-CM

## 2013-11-24 DIAGNOSIS — I480 Paroxysmal atrial fibrillation: Secondary | ICD-10-CM

## 2013-11-24 DIAGNOSIS — Z7901 Long term (current) use of anticoagulants: Secondary | ICD-10-CM

## 2013-11-24 LAB — POCT INR: INR: 3.8

## 2013-11-24 NOTE — Progress Notes (Signed)
Per Dr. Loletha Grayer at last office visit do EKG when patient comes in for INR.

## 2013-11-27 ENCOUNTER — Telehealth: Payer: Self-pay | Admitting: *Deleted

## 2013-11-27 NOTE — Telephone Encounter (Signed)
Patient notified Dr. Loletha Grayer review his EKG - good heart rate, still in AF.  Continue current medication.  Patient voiced understanding.

## 2013-11-27 NOTE — Telephone Encounter (Signed)
Message copied by Tressa Busman on Mon Nov 27, 2013  9:35 AM ------      Message from: Sanda Klein      Created: Fri Nov 24, 2013  6:27 PM       Please tell him: Heart rate was better on ECG in office (87 bpm), still in AF. Stay on same meds. ------

## 2013-12-15 ENCOUNTER — Ambulatory Visit (INDEPENDENT_AMBULATORY_CARE_PROVIDER_SITE_OTHER): Payer: Medicare Other | Admitting: Pharmacist Clinician (PhC)/ Clinical Pharmacy Specialist

## 2013-12-15 VITALS — BP 158/80 | HR 68

## 2013-12-15 DIAGNOSIS — I4891 Unspecified atrial fibrillation: Secondary | ICD-10-CM

## 2013-12-15 DIAGNOSIS — Z7901 Long term (current) use of anticoagulants: Secondary | ICD-10-CM

## 2013-12-15 DIAGNOSIS — I480 Paroxysmal atrial fibrillation: Secondary | ICD-10-CM

## 2013-12-15 LAB — POCT INR: INR: 2.1

## 2014-01-12 ENCOUNTER — Ambulatory Visit (INDEPENDENT_AMBULATORY_CARE_PROVIDER_SITE_OTHER): Payer: Medicare Other | Admitting: Pharmacist Clinician (PhC)/ Clinical Pharmacy Specialist

## 2014-01-12 VITALS — BP 132/68 | HR 64

## 2014-01-12 DIAGNOSIS — Z7901 Long term (current) use of anticoagulants: Secondary | ICD-10-CM

## 2014-01-12 DIAGNOSIS — I4891 Unspecified atrial fibrillation: Secondary | ICD-10-CM

## 2014-01-12 DIAGNOSIS — I480 Paroxysmal atrial fibrillation: Secondary | ICD-10-CM

## 2014-01-12 LAB — POCT INR: INR: 2.2

## 2014-02-02 IMAGING — CR DG CHEST 2V
2 series · 2 of 2 positions shown · non-contrast
Comparison: 03/23/2007.

CLINICAL DATA: Preoperative radiograph.  Stent placement.  Former
smoker.  Hypertension.

CHEST - 2 VIEW

[view not recorded (1 of 2)]
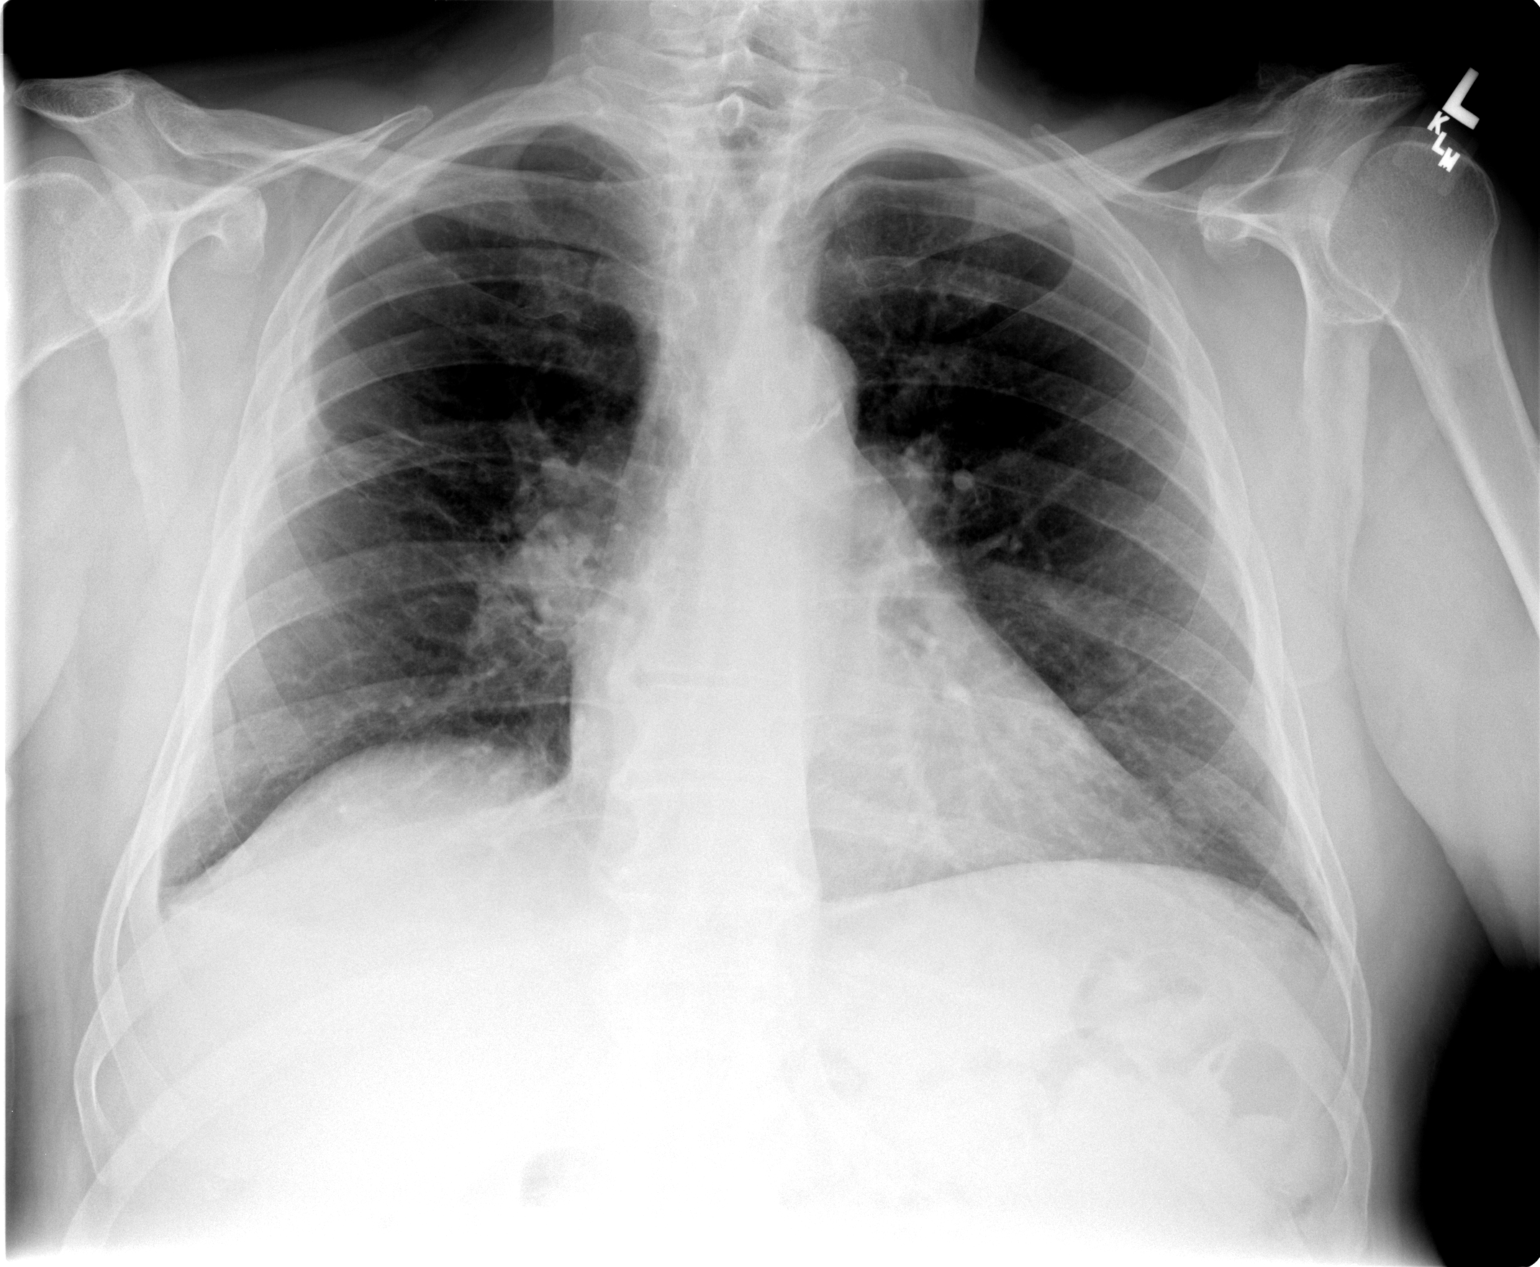

[view not recorded (2 of 2)]
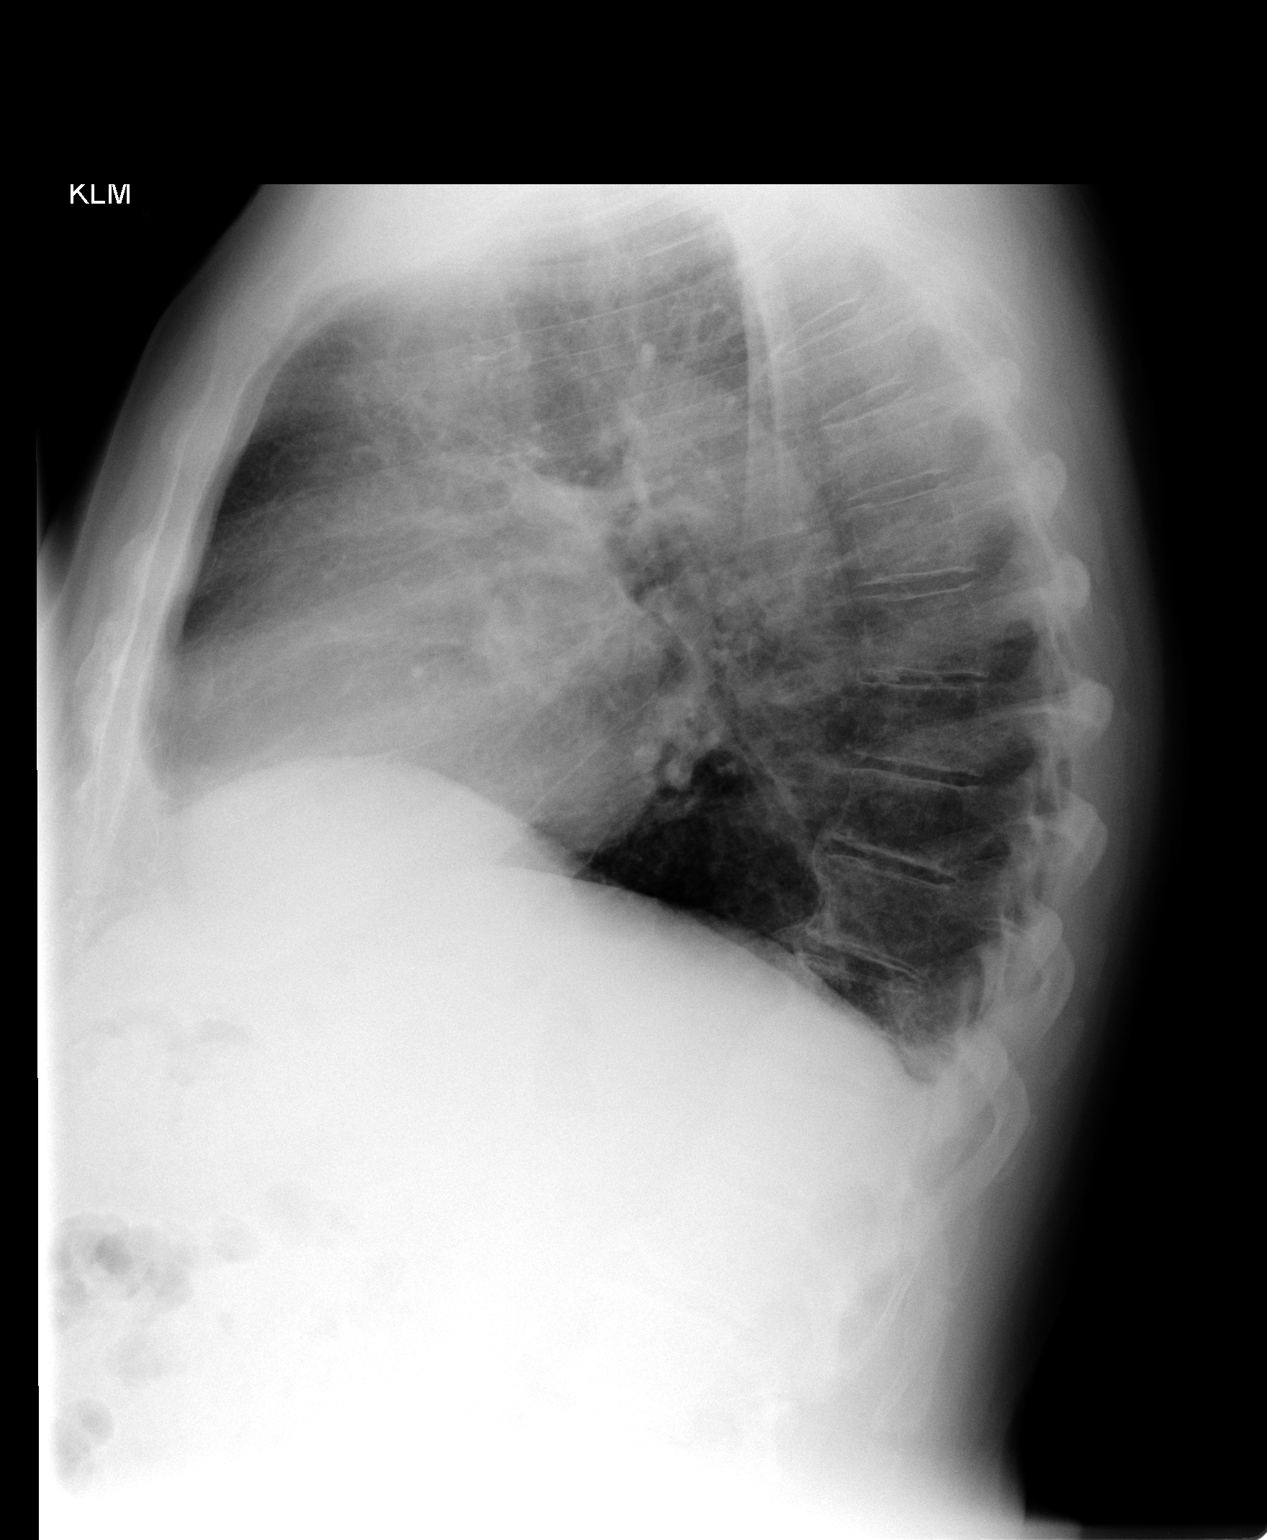

[2 of 2 positions shown; findings below may reference images not displayed]

FINDINGS: Stable right midlung pulmonary parenchymal scarring and
prominence of the right hilum.  There is no airspace disease.
Small right pleural effusion is present with blunting of the right
costophrenic angle.  Cardiopericardial silhouette and mediastinal
contours are within normal limits.
IMPRESSION: Stable appearance of the chest.  No active cardiopulmonary disease.

## 2014-02-16 ENCOUNTER — Ambulatory Visit (INDEPENDENT_AMBULATORY_CARE_PROVIDER_SITE_OTHER): Payer: Medicare Other | Admitting: Pharmacist Clinician (PhC)/ Clinical Pharmacy Specialist

## 2014-02-16 VITALS — BP 132/64 | HR 52

## 2014-02-16 DIAGNOSIS — I480 Paroxysmal atrial fibrillation: Secondary | ICD-10-CM

## 2014-02-16 DIAGNOSIS — I4891 Unspecified atrial fibrillation: Secondary | ICD-10-CM

## 2014-02-16 DIAGNOSIS — Z7901 Long term (current) use of anticoagulants: Secondary | ICD-10-CM

## 2014-02-16 LAB — POCT INR: INR: 2.1

## 2014-03-07 ENCOUNTER — Other Ambulatory Visit: Payer: Self-pay | Admitting: Orthopedic Surgery

## 2014-03-21 ENCOUNTER — Other Ambulatory Visit: Payer: Self-pay | Admitting: Pharmacist Clinician (PhC)/ Clinical Pharmacy Specialist

## 2014-03-30 ENCOUNTER — Ambulatory Visit (INDEPENDENT_AMBULATORY_CARE_PROVIDER_SITE_OTHER): Payer: Medicare Other | Admitting: Pharmacist Clinician (PhC)/ Clinical Pharmacy Specialist

## 2014-03-30 VITALS — BP 152/82

## 2014-03-30 DIAGNOSIS — I480 Paroxysmal atrial fibrillation: Secondary | ICD-10-CM

## 2014-03-30 DIAGNOSIS — Z7901 Long term (current) use of anticoagulants: Secondary | ICD-10-CM

## 2014-03-30 DIAGNOSIS — I4891 Unspecified atrial fibrillation: Secondary | ICD-10-CM

## 2014-03-30 LAB — POCT INR: INR: 2.2

## 2014-04-05 ENCOUNTER — Other Ambulatory Visit: Payer: Self-pay | Admitting: Cardiovascular Disease

## 2014-04-05 NOTE — Telephone Encounter (Signed)
Rx was sent to pharmacy electronically. 

## 2014-05-07 ENCOUNTER — Other Ambulatory Visit: Payer: Self-pay | Admitting: Cardiovascular Disease

## 2014-05-07 NOTE — Telephone Encounter (Signed)
Rx was sent to pharmacy electronically. OV 10/30

## 2014-05-25 ENCOUNTER — Ambulatory Visit (INDEPENDENT_AMBULATORY_CARE_PROVIDER_SITE_OTHER): Payer: Medicare Other | Admitting: Pharmacist Clinician (PhC)/ Clinical Pharmacy Specialist

## 2014-05-25 ENCOUNTER — Encounter: Payer: Self-pay | Admitting: Cardiovascular Disease

## 2014-05-25 ENCOUNTER — Ambulatory Visit (INDEPENDENT_AMBULATORY_CARE_PROVIDER_SITE_OTHER): Payer: Medicare Other | Admitting: Cardiovascular Disease

## 2014-05-25 VITALS — BP 136/86 | HR 95 | Ht 67.0 in | Wt 176.7 lb

## 2014-05-25 DIAGNOSIS — I480 Paroxysmal atrial fibrillation: Secondary | ICD-10-CM

## 2014-05-25 DIAGNOSIS — I10 Essential (primary) hypertension: Secondary | ICD-10-CM

## 2014-05-25 DIAGNOSIS — I739 Peripheral vascular disease, unspecified: Secondary | ICD-10-CM

## 2014-05-25 DIAGNOSIS — Z7901 Long term (current) use of anticoagulants: Secondary | ICD-10-CM

## 2014-05-25 DIAGNOSIS — I2510 Atherosclerotic heart disease of native coronary artery without angina pectoris: Secondary | ICD-10-CM

## 2014-05-25 DIAGNOSIS — R0602 Shortness of breath: Secondary | ICD-10-CM

## 2014-05-25 DIAGNOSIS — E785 Hyperlipidemia, unspecified: Secondary | ICD-10-CM

## 2014-05-25 DIAGNOSIS — R609 Edema, unspecified: Secondary | ICD-10-CM

## 2014-05-25 LAB — POCT INR: INR: 2.8

## 2014-05-25 MED ORDER — FUROSEMIDE 40 MG PO TABS
40.0000 mg | ORAL_TABLET | Freq: Every day | ORAL | Status: DC
Start: 2014-05-25 — End: 2015-02-01

## 2014-05-25 MED ORDER — POTASSIUM CHLORIDE CRYS ER 20 MEQ PO TBCR: 20.0000 meq | EXTENDED_RELEASE_TABLET | Freq: Every day | ORAL | Status: DC

## 2014-05-25 NOTE — Patient Instructions (Signed)
Your physician has requested that you have an echocardiogram. Echocardiography is a painless test that uses sound waves to create images of your heart. It provides your doctor with information about the size and shape of your heart and how well your heart's chambers and valves are working. This procedure takes approximately one hour. There are no restrictions for this procedure.  START Furosemide 40mg  and Potassium 79meq daily as needed for swelling.  Dr. Sallyanne Kuster recommends that you schedule a follow-up appointment in: 6 months.

## 2014-05-25 NOTE — Progress Notes (Signed)
Patient ID: Shaun Mclaughlin, male   DOB: 27-May-1935, 79 y.o.   MRN: [id number]     Reason for office visit Paroxysmal atrial fibrillation , CAD, PAD  Shaun Mclaughlin is generally feeling well but has developed persistent lower extremity edema. Over the summer the edema would come and go, disappearing after overnight rest. It now has settled in a persistent pattern. His weight is roughly 7 pounds more than it was in April, but similar to his weight last year. He has mild shortness of breath but doesn't think this is much different from his baseline. He continues to smoke roughly 5 cigarettes a day. He has not had any angina or arm claudication. He is scheduled to have cataract surgery next Tuesday.  His rhythm today is atrial fibrillation with controlled ventricular rate (rather coarse fibrillation resembling atypical flutter). He states that when he saw his primary care physician a month or so ago he was in normal rhythm. He had labs performed at that visit which were "all okay". On therapeutic levels of warfarin anticoagulation, with INR that is always in the target range. He has not had any bleeding complications.  He has paroxysmal atrial fibrillation and takes sotalol and warfarin. He does not have a history of stroke or TIA or other embolic event and has not had bleeding complications. He has coronary artery disease and previously underwent placement of a stent to the right coronary artery in 2003, or coronaries patent by cardiac catheterization in 2013. He has hypertension, diabetes mellitus and hyperlipidemia all of which are well compensated usually. He has peripheral arterial disease and received a stent to the distal left subclavian artery in January 2014 for left arm claudication (10 x 40 mm Cordis Smart nitinol). He is also known to have a 50-60% left renal artery stenosis.   No Known Allergies  Current Outpatient Prescriptions  Medication Sig Dispense Refill  . Ascorbic Acid (VITAMIN C WITH ROSE  HIPS) 1000 MG tablet Take 1,000 mg by mouth daily.      Marland Kitchen aspirin EC 81 MG tablet Take 81 mg by mouth daily.      Marland Kitchen atorvastatin (LIPITOR) 40 MG tablet Take 1 tablet (40 mg total) by mouth daily.  30 tablet  11  . KRILL OIL PO Take by mouth daily.      Marland Kitchen latanoprost (XALATAN) 0.005 % ophthalmic solution Place 1 drop into both eyes at bedtime.      Marland Kitchen lisinopril (PRINIVIL,ZESTRIL) 20 MG tablet take 1 tablet by mouth once daily  30 tablet  6  . loratadine (CLARITIN) 10 MG tablet Take 10 mg by mouth daily.      . metFORMIN (GLUCOPHAGE-XR) 500 MG 24 hr tablet Take 500 mg by mouth daily with breakfast.      . Multiple Vitamin (MULTIVITAMIN WITH MINERALS) TABS Take 1 tablet by mouth daily.      Marland Kitchen omeprazole (PRILOSEC) 20 MG capsule Take 20 mg by mouth daily.      Marland Kitchen propylthiouracil (PTU) 50 MG tablet Take 50 mg by mouth daily.      Marland Kitchen SOTALOL AF 120 MG TABS Take 0.5 tablets by mouth 2 (two) times daily.  30 each  1  . warfarin (COUMADIN) 5 MG tablet Take 1 to 1.5 tablets by mouth daily as directed by coumadin clinic  120 tablet  1   No current facility-administered medications for this visit.    Past Medical History  Diagnosis Date  . Hypercholesteremia   . Pneumonia ~ 2008  .  Chronic bronchitis     "about q yr" (08/04/2012)  . Hyperthyroidism   . Type II diabetes mellitus     "take RX; I'm prediabetic; don't have to  check my CBG qd; keep my weight down" (08/04/2012)  . GERD (gastroesophageal reflux disease)   . External bleeding hemorrhoids ~ 2003; 2008; 2013    "removed polyps and no bleeding since" (08/04/2012)  . Arthritis     "maybe a little bit in some joints" (08/04/2012)  . Kidney stone 1980's  . PVD (peripheral vascular disease) with claudication, Lt Upper ext. pain, and known 80-90%distal Lt. subclavian artery stenosis  08/05/2012  . CAD (coronary artery disease),Hx RCA stenting-patent 06/2012  08/05/2012  . PAF (paroxysmal atrial fibrillation), maintaining SR 08/05/2012  . Chronic  anticoagulation, coumadin for PAF 08/05/2012  . DM (diabetes mellitus) 08/05/2012  . Hyperlipidemia 08/05/2012  . S/P angioplasty with stent to Lt. subclavian artery -Nitrinol stent 08/04/12 08/05/2012  . HTN (hypertension) 08/05/2012    Past Surgical History  Procedure Laterality Date  . Coronary angioplasty with stent placement  2003    patent 12/13  . Cardiac catheterization  2008 and Dec 2013    patent coronaries  . Tonsillectomy  1940's  . Hernia repair  4650'P    "umbilical" (11/28/6566)  . Inguinal hernia repair  2000's    "right" (08/04/2012)  . Cataract extraction w/ intraocular lens implant      "right eye" (08/04/2012)  . Glaucoma surgery  2000's    "right eye; had laser procedure  to lower the pressure and prevent glaucoma" (08/04/2012)  . Lithotripsy  1980's    "imploded in my kidney; ended up having to be cut open in my back" (08/04/2012)  . Kidney stone surgery  1980's  . Subclavian stent placement  Jan 2014    Lt SCA    Family History  Problem Relation Age of Onset  . Stroke Mother 52  . Coronary artery disease Father 13    History   Social History  . Marital Status: Married    Spouse Name: N/A    Number of Children: N/A  . Years of Education: N/A   Occupational History  . Not on file.   Social History Main Topics  . Smoking status: Current Every Day Smoker -- 0.12 packs/day for 25 years    Types: Cigarettes, Pipe  . Smokeless tobacco: Never Used     Comment: 08/04/2012 "still smoke a cigar q once in awhile"  . Alcohol Use: Yes     Comment: 08/04/2012 "once/month I might have a drink (beer, wine, or margarita)  . Drug Use: No  . Sexual Activity: No   Other Topics Concern  . Not on file   Social History Narrative  . No narrative on file    Review of systems: Lower extremity edema, occasional cough, mild shortness of breath with exertion The patient specifically denies any chest pain at rest or with exertion, dyspnea at rest or with exertion, orthopnea,  paroxysmal nocturnal dyspnea, syncope, palpitations, focal neurological deficits, intermittent claudication, lower extremity edema, unexplained weight gain, cough, hemoptysis or wheezing.  The patient also denies abdominal pain, nausea, vomiting, dysphagia, diarrhea, constipation, polyuria, polydipsia, dysuria, hematuria, frequency, urgency, abnormal bleeding or bruising, fever, chills, unexpected weight changes, mood swings, change in skin or hair texture, change in voice quality, auditory or visual problems, allergic reactions or rashes, new musculoskeletal complaints other than usual "aches and pains".   PHYSICAL EXAM BP 136/86  Pulse 95  Ht  5\' 7"  (1.702 m)  Wt 176 lb 11.2 oz (80.151 kg)  BMI 27.67 kg/m2 General: Alert, oriented x3, no distress  Head: no evidence of trauma, PERRL, EOMI, no exophtalmos or lid lag, no myxedema, no xanthelasma; normal ears, nose and oropharynx  Neck: normal jugular venous pulsations and no hepatojugular reflux; brisk carotid pulses without delay and no carotid bruits  Chest: clear to auscultation, no signs of consolidation by percussion or palpation, normal fremitus, symmetrical and full respiratory excursions  Cardiovascular: normal position and quality of the apical impulse, irregular rhythm, normal first and second heart sounds, no murmurs, rubs or gallops  Abdomen: no tenderness or distention, no masses by palpation, no abnormal pulsatility or arterial bruits, normal bowel sounds, no hepatosplenomegaly  Extremities: no clubbing, cyanosis; symmetrical 2+ pitting calf/pretibial edema; 2+ radial, ulnar and brachial pulses bilaterally; 2+ right femoral, posterior tibial and dorsalis pedis pulses; 2+ left femoral, posterior tibial and dorsalis pedis pulses; no subclavian or femoral bruits  Neurological: grossly nonfocal  EKG: Showed atrial fibrillation (? Atypical atrial flutter) with aberrantly conducted beats. QTC is difficult to measure accurately due to the  atrial arrhythmia  BMET    Component Value Date/Time   NA 140 08/05/2012 0550   K 4.0 08/05/2012 0550   CL 102 08/05/2012 0550   CO2 28 08/05/2012 0550   GLUCOSE 133* 08/05/2012 0550   BUN 9 08/05/2012 0550   CREATININE 0.74 08/05/2012 0550   CALCIUM 9.0 08/05/2012 0550   GFRNONAA 88* 08/05/2012 0550   GFRAA >90 08/05/2012 0550   Lipid profile from PCP office shows total cholesterol 107, triglycerides 82, HDL 25, LDL 66  (03/09/2014) Hemoglobin 15, glucose 126, BUN 16, creatinine 0.9, potassium 5.2, normal liver function tests   ASSESSMENT AND PLAN Persistent atrial fibrillation In our office, this is the third consecutive ECG in atrial fibrillation (April, May and today). He tells me that in his primary care physician's office he was in normal rhythm recently, but when I obtained a copy he was actually in the same rhythm as he is today. When in sinus rhythm he is usually bradycardic in the 50s. He may have settled into a pattern of permanent atrial fibrillation. May eventually need a dual-chamber pacemaker if he develops full-blown tachycardia-bradycardia syndrome and is symptomatic. Continue anticoagulants   PVD - s/p LSCA stent 08/04/12  Asymptomatic. Equal blood pressures in left and right upper extremity   CAD,Hx RCA stent Jan '03. patent at cath 06/2012  No symptoms of angina. Preserved left ventricular systolic function on previous assessments. Recent cath showed widely patent stent. Focus on long-term coronary risk factor modification.  HTN (hypertension)  Well-controlled  Lower extremity edema I'm concerned this may represent congestive heart failure. Repeat echocardiogram. Ranges in left ventricular systolic function may prompt evaluation for recurrent coronary problems and may change the antiarrhythmic approach. Consider electrical cardioversion if symptoms of heart failure persist. Not entirely sure if the arrhythmia has anything to do with it. Start furosemide 40 mg daily and  potassium 20 mEq daily. Will use this only as needed until the edema resolves, not as a daily medication.  Tobacco use He only smokes 5 cigarettes a day, but I encouraged him to quit completely and permanently  Hyperlipidemia  excellent lipid parameters except low HDL cholesterol   Orders Placed This Encounter  Procedures  . EKG 12-Lead  . 2D Echocardiogram without contrast   Shaun Mclaughlin  Shaun Klein, MD, [facility], [facility] [phone] office [phone] pager

## 2014-06-08 ENCOUNTER — Ambulatory Visit (HOSPITAL_COMMUNITY)
Admission: RE | Admit: 2014-06-08 | Discharge: 2014-06-08 | Disposition: A | Discharge status: Home / Self Care | Payer: Medicare Other | Source: Ambulatory Visit | Attending: Internal Medicine | Admitting: Internal Medicine

## 2014-06-08 DIAGNOSIS — R609 Edema, unspecified: Secondary | ICD-10-CM | POA: Diagnosis not present

## 2014-06-08 DIAGNOSIS — I358 Other nonrheumatic aortic valve disorders: Secondary | ICD-10-CM | POA: Insufficient documentation

## 2014-06-08 DIAGNOSIS — I517 Cardiomegaly: Secondary | ICD-10-CM

## 2014-06-08 DIAGNOSIS — R0602 Shortness of breath: Secondary | ICD-10-CM | POA: Insufficient documentation

## 2014-06-08 NOTE — Progress Notes (Signed)
2D Echocardiogram Complete.  06/08/2014   Shaun Mclaughlin [place], [place]

## 2014-06-08 NOTE — Progress Notes (Signed)
LM to call for echo results and told of appt 06/27/14 @ 11:45am.

## 2014-06-11 ENCOUNTER — Telehealth: Payer: Self-pay | Admitting: Cardiovascular Disease

## 2014-06-11 NOTE — Telephone Encounter (Signed)
Returning Call to get his test results . Please call    Thanks

## 2014-06-11 NOTE — Telephone Encounter (Signed)
Spoke with pt, aware of echo results and the need to be seen. He had an appointment on 06-27-14 but asked for that appt to be changed to a Friday because he is off work. The only other Friday dr c is in the office is 06-29-14 and the schedule is full. Patient would like to be worked in that day if possible. Will forward to [place], dr c's CMA for help with scheduling.

## 2014-06-11 NOTE — Telephone Encounter (Signed)
Deferred to Billie to schedule 06/29/14 at 4:15pm

## 2014-06-27 ENCOUNTER — Ambulatory Visit: Payer: Medicare Other | Admitting: Cardiovascular Disease

## 2014-06-29 ENCOUNTER — Other Ambulatory Visit: Payer: Self-pay | Admitting: Cardiovascular Disease

## 2014-06-29 ENCOUNTER — Encounter: Payer: Self-pay | Admitting: Cardiovascular Disease

## 2014-06-29 ENCOUNTER — Ambulatory Visit (INDEPENDENT_AMBULATORY_CARE_PROVIDER_SITE_OTHER): Payer: Medicare Other | Admitting: Cardiovascular Disease

## 2014-06-29 VITALS — BP 144/83 | HR 83 | Resp 16 | Ht 67.0 in | Wt 170.2 lb

## 2014-06-29 DIAGNOSIS — I480 Paroxysmal atrial fibrillation: Secondary | ICD-10-CM

## 2014-06-29 DIAGNOSIS — R5383 Other fatigue: Secondary | ICD-10-CM

## 2014-06-29 DIAGNOSIS — Z79899 Other long term (current) drug therapy: Secondary | ICD-10-CM

## 2014-06-29 DIAGNOSIS — I5043 Acute on chronic combined systolic (congestive) and diastolic (congestive) heart failure: Secondary | ICD-10-CM

## 2014-06-29 DIAGNOSIS — D689 Coagulation defect, unspecified: Secondary | ICD-10-CM

## 2014-06-29 HISTORY — DX: Acute on chronic combined systolic (congestive) and diastolic (congestive) heart failure: I50.43

## 2014-06-29 NOTE — Progress Notes (Signed)
Patient ID: Shaun Mclaughlin, male   DOB: 12/18/1934, 79 y.o.   MRN: [id number]      Reason for office visit Paroxysmal atrial fibrillation , CAD, PAD  Don shows substantial improvement in his lower extremity edema after institution of diuretic therapy. His weight has decreased by roughly 7 pounds since his last appointment in October and is similar to what it was in April. He has mild shortness of breath but doesn't think this is much different from his baseline. He continues to smoke roughly 5 cigarettes a day. He has not had any angina or arm claudication.  His rhythm today is atrial flutter with roughly 4:1 AV block and frequent PVCs in a pattern of quadrigeminy . On therapeutic levels of warfarin anticoagulation, with INR that is always in the target range. He has not had any bleeding complications.  Follow-up echocardiogram shows maybe a slight reduction in overall left ventricular systolic function. The ejection fraction was estimated to be 45-50 percent, previously 50-55 percent by echo and 43% by nuclear stress testing. He has an inferior wall scar by nuclear perfusion images, assistant with his right coronary problems in the past..  He has paroxysmal atrial fibrillation and takes sotalol and warfarin. He does not have a history of stroke or TIA or other embolic event and has not had bleeding complications. He has coronary artery disease and previously underwent placement of a stent to the right coronary artery in 2003, or coronaries patent by cardiac catheterization in 2013. He has hypertension, diabetes mellitus and hyperlipidemia all of which are well compensated usually. He has peripheral arterial disease and received a stent to the distal left subclavian artery in January 2014 for left arm claudication (10 x 40 mm Cordis Smart nitinol). He is also known to have a 50-60% left renal artery stenosis.   No Known Allergies  Current Outpatient Prescriptions  Medication Sig Dispense Refill    . Ascorbic Acid (VITAMIN C WITH ROSE HIPS) 1000 MG tablet Take 1,000 mg by mouth daily.    . aspirin EC 81 MG tablet Take 81 mg by mouth daily.    . atorvastatin (LIPITOR) 40 MG tablet Take 1 tablet (40 mg total) by mouth daily. 30 tablet 11  . brimonidine (ALPHAGAN) 0.15 % ophthalmic solution   0  . furosemide (LASIX) 40 MG tablet Take 1 tablet (40 mg total) by mouth daily. 30 tablet 6  . KRILL OIL PO Take by mouth daily.    . latanoprost (XALATAN) 0.005 % ophthalmic solution Place 1 drop into both eyes at bedtime.    . lisinopril (PRINIVIL,ZESTRIL) 20 MG tablet take 1 tablet by mouth once daily 30 tablet 6  . loratadine (CLARITIN) 10 MG tablet Take 10 mg by mouth daily.    . metFORMIN (GLUCOPHAGE-XR) 500 MG 24 hr tablet Take 500 mg by mouth daily with breakfast.    . Multiple Vitamin (MULTIVITAMIN WITH MINERALS) TABS Take 1 tablet by mouth daily.    . omeprazole (PRILOSEC) 20 MG capsule Take 20 mg by mouth daily.    . potassium chloride SA (K-DUR,KLOR-CON) 20 MEQ tablet Take 1 tablet (20 mEq total) by mouth daily. 30 tablet 6  . propylthiouracil (PTU) 50 MG tablet Take 50 mg by mouth daily.    . SOTALOL AF 120 MG TABS Take 0.5 tablets by mouth 2 (two) times daily. 30 each 5  . warfarin (COUMADIN) 5 MG tablet Take 1 to 1.5 tablets by mouth daily as directed by coumadin clinic 120 tablet   1   No current facility-administered medications for this visit.    Past Medical History  Diagnosis Date  . Hypercholesteremia   . Pneumonia ~ 2008  . Chronic bronchitis     "about q yr" (08/04/2012)  . Hyperthyroidism   . Type II diabetes mellitus     "take RX; I'm prediabetic; don't have to  check my CBG qd; keep my weight down" (08/04/2012)  . GERD (gastroesophageal reflux disease)   . External bleeding hemorrhoids ~ 2003; 2008; 2013    "removed polyps and no bleeding since" (08/04/2012)  . Arthritis     "maybe a little bit in some joints" (08/04/2012)  . Kidney stone 1980's  . PVD (peripheral  vascular disease) with claudication, Lt Upper ext. pain, and known 80-90%distal Lt. subclavian artery stenosis  08/05/2012  . CAD (coronary artery disease),Hx RCA stenting-patent 06/2012  08/05/2012  . PAF (paroxysmal atrial fibrillation), maintaining SR 08/05/2012  . Chronic anticoagulation, coumadin for PAF 08/05/2012  . DM (diabetes mellitus) 08/05/2012  . Hyperlipidemia 08/05/2012  . S/P angioplasty with stent to Lt. subclavian artery -Nitrinol stent 08/04/12 08/05/2012  . HTN (hypertension) 08/05/2012    Past Surgical History  Procedure Laterality Date  . Coronary angioplasty with stent placement  2003    patent 12/13  . Cardiac catheterization  2008 and Dec 2013    patent coronaries  . Tonsillectomy  1940's  . Hernia repair  2000's    "umbilical" (08/04/2012)  . Inguinal hernia repair  2000's    "right" (08/04/2012)  . Cataract extraction w/ intraocular lens implant      "right eye" (08/04/2012)  . Glaucoma surgery  2000's    "right eye; had laser procedure  to lower the pressure and prevent glaucoma" (08/04/2012)  . Lithotripsy  1980's    "imploded in my kidney; ended up having to be cut open in my back" (08/04/2012)  . Kidney stone surgery  1980's  . Subclavian stent placement  Jan 2014    Lt SCA    Family History  Problem Relation Age of Onset  . Stroke Mother 73  . Coronary artery disease Father 93    History   Social History  . Marital Status: Married    Spouse Name: N/A    Number of Children: N/A  . Years of Education: N/A   Occupational History  . Not on file.   Social History Main Topics  . Smoking status: Current Every Day Smoker -- 0.12 packs/day for 25 years    Types: Cigarettes, Pipe  . Smokeless tobacco: Never Used     Comment: 08/04/2012 "still smoke a cigar q once in awhile"  . Alcohol Use: Yes     Comment: 08/04/2012 "once/month I might have a drink (beer, wine, or margarita)  . Drug Use: No  . Sexual Activity: No   Other Topics Concern  . Not on file    Social History Narrative    Review of systems: Lower extremity edema, occasional cough, mild shortness of breath with exertion The patient specifically denies any chest pain at rest or with exertion, dyspnea at rest or with exertion, orthopnea, paroxysmal nocturnal dyspnea, syncope, palpitations, focal neurological deficits, intermittent claudication, lower extremity edema, unexplained weight gain, cough, hemoptysis or wheezing.  The patient also denies abdominal pain, nausea, vomiting, dysphagia, diarrhea, constipation, polyuria, polydipsia, dysuria, hematuria, frequency, urgency, abnormal bleeding or bruising, fever, chills, unexpected weight changes, mood swings, change in skin or hair texture, change in voice quality, auditory or visual problems,   allergic reactions or rashes, new musculoskeletal complaints other than usual "aches and pains".  PHYSICAL EXAM BP 144/83 mmHg  Pulse 83  Resp 16  Ht 5' 7" (1.702 m)  Wt 170 lb 3.2 oz (77.202 kg)  BMI 26.65 kg/m2 General: Alert, oriented x3, no distress  Head: no evidence of trauma, PERRL, EOMI, no exophtalmos or lid lag, no myxedema, no xanthelasma; normal ears, nose and oropharynx  Neck: normal jugular venous pulsations and no hepatojugular reflux; brisk carotid pulses without delay and no carotid bruits  Chest: clear to auscultation, no signs of consolidation by percussion or palpation, normal fremitus, symmetrical and full respiratory excursions  Cardiovascular: normal position and quality of the apical impulse, regular rhythm with ectopy and a pattern of quadrigeminy, normal first and second heart sounds, no murmurs, rubs or gallops  Abdomen: no tenderness or distention, no masses by palpation, no abnormal pulsatility or arterial bruits, normal bowel sounds, no hepatosplenomegaly  Extremities: no clubbing, cyanosis; symmetrical 2+ pitting calf/pretibial edema; 2+ radial, ulnar and brachial pulses bilaterally; 2+ right femoral,  posterior tibial and dorsalis pedis pulses; 2+ left femoral, posterior tibial and dorsalis pedis pulses; no subclavian or femoral bruits  Neurological: grossly nonfocal   EKG: Atrial flutter, probably typical counterclockwise with controlled ventricular rate and frequent monomorphic PVCs in a pattern of quadrigeminy. Due to the incessant flutter waves it is very difficult to accurately measure the QT interval. I think it is probably around 420 ms (corrected for his heart rate of 95 bpm, QTC roughly 500 ms)  Lipid Panel  No results found for: CHOL, TRIG, HDL, CHOLHDL, VLDL, LDLCALC, LDLDIRECT  BMET    Component Value Date/Time   NA 140 08/05/2012 0550   K 4.0 08/05/2012 0550   CL 102 08/05/2012 0550   CO2 28 08/05/2012 0550   GLUCOSE 133* 08/05/2012 0550   BUN 9 08/05/2012 0550   CREATININE 0.74 08/05/2012 0550   CALCIUM 9.0 08/05/2012 0550   GFRNONAA 88* 08/05/2012 0550   GFRAA >90 08/05/2012 0550     ASSESSMENT AND PLAN Persistent atrial fibrillation In our office, this is the third consecutive ECG in atrial fibrillation (April, May and today). He tells me that in his primary care physician's office he was in normal rhythm recently, but when I obtained a copy he was actually in the same rhythm as he is today. When in sinus rhythm he is usually bradycardic in the 50s. He may have settled into a pattern of permanent atrial fibrillation, but today the appearances of typical atrial flutter, suggesting he may still respond to cardioversion. I am reluctant to increase his dose of sotalol since the QT interval appears to be borderline rinses although hard to accurately measure.  I recommended an elective cardioversion. If his QT interval in sinus rhythm permits will then increase the sotalol to 80 mg twice a day. This may cause worsened sinus bradycardia May eventually need a dual-chamber pacemaker if he develops full-blown tachycardia-bradycardia syndrome and is symptomatic. Continue  anticoagulants. He works 4 days a week except for Fridays. We'll bring him back next Friday the 11th for repeat electrocardiogram to make sure he is not returning to sinus rhythm. Tentatively scheduled for elective cardioversion on December 18.  This procedure has been fully reviewed with the patient and informed consent has been obtained.   PVD - s/p LSCA stent 08/04/12  Asymptomatic. Equal blood pressures in left and right upper extremity   CAD,Hx RCA stent Jan '03. patent at cath 06/2012  No   symptoms of angina. Preserved left ventricular systolic function on previous assessments. Recent cath showed widely patent stent. Focus on long-term coronary risk factor modification.  HTN (hypertension)  Well-controlled  Lower extremity edema - combined systolic and diastolic heart failure, acute on chronic Prompt improvement following diuretics is consistent with congestive heart failure. Since his ejection fraction is at most minimally different, I think the persistent arrhythmia is the likely cause. He may therefore benefit from cardioversion. Long-term maintenance of sinus rhythm will be challenging.  Tobacco use He only smokes 5 cigarettes a day, but I encouraged him to quit completely and permanently  Hyperlipidemia excellent lipid parameters except low HDL cholesterol Orders Placed This Encounter  Procedures  . APTT  . Basic metabolic panel  . CBC  . Protime-INR  . TSH   Meds ordered this encounter  Medications  . brimonidine (ALPHAGAN) 0.15 % ophthalmic solution    Sig:     Refill:  0    Tamarah Bhullar  Leeza Heiner, MD, FACC [facility] [phone] office [phone] pager   

## 2014-06-29 NOTE — Telephone Encounter (Signed)
Rx was sent to pharmacy electronically. 

## 2014-06-29 NOTE — Patient Instructions (Signed)
Please come in on December 11 for an EKG.  Depending upon the EKG results, you will proceed with a cardioversion on December 18.  Your physician has recommended that you have a Cardioversion (DCCV). Electrical Cardioversion uses a jolt of electricity to your heart either through paddles or wired patches attached to your chest. This is a controlled, usually prescheduled, procedure. Defibrillation is done under light anesthesia in the hospital, and you usually go home the day of the procedure. This is done to get your heart back into a normal rhythm. You are not awake for the procedure. Please see the instruction sheet given to you today.

## 2014-07-02 ENCOUNTER — Encounter: Payer: Self-pay | Admitting: Cardiovascular Disease

## 2014-07-03 ENCOUNTER — Other Ambulatory Visit: Payer: Self-pay | Admitting: *Deleted

## 2014-07-03 DIAGNOSIS — I480 Paroxysmal atrial fibrillation: Secondary | ICD-10-CM

## 2014-07-05 ENCOUNTER — Encounter (HOSPITAL_COMMUNITY): Payer: Self-pay | Admitting: Cardiovascular Disease

## 2014-07-06 ENCOUNTER — Ambulatory Visit (INDEPENDENT_AMBULATORY_CARE_PROVIDER_SITE_OTHER): Payer: Medicare Other | Admitting: Pharmacist Clinician (PhC)/ Clinical Pharmacy Specialist

## 2014-07-06 ENCOUNTER — Ambulatory Visit (INDEPENDENT_AMBULATORY_CARE_PROVIDER_SITE_OTHER): Payer: Medicare Other

## 2014-07-06 DIAGNOSIS — Z7901 Long term (current) use of anticoagulants: Secondary | ICD-10-CM

## 2014-07-06 DIAGNOSIS — I480 Paroxysmal atrial fibrillation: Secondary | ICD-10-CM

## 2014-07-06 LAB — POCT INR: INR: 3.1

## 2014-07-09 LAB — BASIC METABOLIC PANEL
BUN: 17 mg/dL (ref 6–23)
CO2: 30 mEq/L (ref 19–32)
CREATININE: 0.98 mg/dL (ref 0.50–1.35)
Calcium: 9.5 mg/dL (ref 8.4–10.5)
Chloride: 100 mEq/L (ref 96–112)
Glucose, Bld: 130 mg/dL — ABNORMAL HIGH (ref 70–99)
Potassium: 5 mEq/L (ref 3.5–5.3)
Sodium: 140 mEq/L (ref 135–145)

## 2014-07-09 LAB — PROTIME-INR
INR: 1.64 — AB (ref <1.50)
PROTHROMBIN TIME: 19.4 s — AB (ref 11.6–15.2)

## 2014-07-09 LAB — CBC
HCT: 44.7 % (ref 39.0–52.0)
HEMOGLOBIN: 14.9 g/dL (ref 13.0–17.0)
MCH: 31 pg (ref 26.0–34.0)
MCHC: 33.3 g/dL (ref 30.0–36.0)
MCV: 92.9 fL (ref 78.0–100.0)
MPV: 11.2 fL (ref 9.4–12.4)
PLATELETS: 223 10*3/uL (ref 150–400)
RBC: 4.81 MIL/uL (ref 4.22–5.81)
RDW: 16.5 % — ABNORMAL HIGH (ref 11.5–15.5)
WBC: 5 10*3/uL (ref 4.0–10.5)

## 2014-07-09 LAB — APTT: aPTT: 43 seconds — ABNORMAL HIGH (ref 24–37)

## 2014-07-09 LAB — TSH: TSH: 0.702 u[IU]/mL (ref 0.350–4.500)

## 2014-07-13 ENCOUNTER — Encounter (HOSPITAL_COMMUNITY): Payer: Self-pay | Admitting: *Deleted

## 2014-07-13 ENCOUNTER — Ambulatory Visit (HOSPITAL_COMMUNITY)
Admission: RE | Admit: 2014-07-13 | Discharge: 2014-07-13 | Disposition: A | Discharge status: Home / Self Care | Payer: Medicare Other | Source: Ambulatory Visit | Attending: Cardiovascular Disease | Admitting: Cardiovascular Disease

## 2014-07-13 ENCOUNTER — Encounter (HOSPITAL_COMMUNITY)
Admission: RE | Disposition: A | Discharge status: Home / Self Care | Payer: Self-pay | Source: Ambulatory Visit | Attending: Cardiovascular Disease

## 2014-07-13 ENCOUNTER — Ambulatory Visit (HOSPITAL_COMMUNITY): Payer: Medicare Other | Admitting: Anesthesiology

## 2014-07-13 DIAGNOSIS — I739 Peripheral vascular disease, unspecified: Secondary | ICD-10-CM | POA: Insufficient documentation

## 2014-07-13 DIAGNOSIS — E0590 Thyrotoxicosis, unspecified without thyrotoxic crisis or storm: Secondary | ICD-10-CM | POA: Insufficient documentation

## 2014-07-13 DIAGNOSIS — E780 Pure hypercholesterolemia: Secondary | ICD-10-CM | POA: Diagnosis not present

## 2014-07-13 DIAGNOSIS — I4892 Unspecified atrial flutter: Secondary | ICD-10-CM | POA: Diagnosis not present

## 2014-07-13 DIAGNOSIS — I493 Ventricular premature depolarization: Secondary | ICD-10-CM | POA: Insufficient documentation

## 2014-07-13 DIAGNOSIS — I480 Paroxysmal atrial fibrillation: Secondary | ICD-10-CM

## 2014-07-13 DIAGNOSIS — J984 Other disorders of lung: Secondary | ICD-10-CM | POA: Insufficient documentation

## 2014-07-13 DIAGNOSIS — F17210 Nicotine dependence, cigarettes, uncomplicated: Secondary | ICD-10-CM | POA: Insufficient documentation

## 2014-07-13 DIAGNOSIS — K219 Gastro-esophageal reflux disease without esophagitis: Secondary | ICD-10-CM | POA: Diagnosis not present

## 2014-07-13 DIAGNOSIS — I10 Essential (primary) hypertension: Secondary | ICD-10-CM | POA: Diagnosis not present

## 2014-07-13 DIAGNOSIS — E119 Type 2 diabetes mellitus without complications: Secondary | ICD-10-CM | POA: Diagnosis not present

## 2014-07-13 DIAGNOSIS — I481 Persistent atrial fibrillation: Secondary | ICD-10-CM | POA: Insufficient documentation

## 2014-07-13 DIAGNOSIS — Z794 Long term (current) use of insulin: Secondary | ICD-10-CM | POA: Diagnosis not present

## 2014-07-13 DIAGNOSIS — I2510 Atherosclerotic heart disease of native coronary artery without angina pectoris: Secondary | ICD-10-CM | POA: Insufficient documentation

## 2014-07-13 DIAGNOSIS — Z7901 Long term (current) use of anticoagulants: Secondary | ICD-10-CM | POA: Insufficient documentation

## 2014-07-13 DIAGNOSIS — I5043 Acute on chronic combined systolic (congestive) and diastolic (congestive) heart failure: Secondary | ICD-10-CM | POA: Diagnosis not present

## 2014-07-13 DIAGNOSIS — I482 Chronic atrial fibrillation: Secondary | ICD-10-CM | POA: Insufficient documentation

## 2014-07-13 HISTORY — PX: CARDIOVERSION: SHX1299

## 2014-07-13 LAB — BASIC METABOLIC PANEL
ANION GAP: 11 (ref 5–15)
BUN: 14 mg/dL (ref 6–23)
CALCIUM: 9.3 mg/dL (ref 8.4–10.5)
CO2: 29 meq/L (ref 19–32)
CREATININE: 0.79 mg/dL (ref 0.50–1.35)
Chloride: 100 mEq/L (ref 96–112)
GFR calc Af Amer: >90 mL/min (ref >90)
GFR, EST NON AFRICAN AMERICAN: 84 mL/min — AB (ref >90)
Glucose, Bld: 120 mg/dL — ABNORMAL HIGH (ref 70–99)
Potassium: 4.6 mEq/L (ref 3.7–5.3)
Sodium: 140 mEq/L (ref 137–147)

## 2014-07-13 LAB — PROTIME-INR
INR: 2.21 — ABNORMAL HIGH (ref 0.00–1.49)
Prothrombin Time: 24.7 seconds — ABNORMAL HIGH (ref 11.6–15.2)

## 2014-07-13 LAB — GLUCOSE, CAPILLARY: Glucose-Capillary: 114 mg/dL — ABNORMAL HIGH (ref 70–99)

## 2014-07-13 SURGERY — CARDIOVERSION
Anesthesia: Monitor Anesthesia Care

## 2014-07-13 MED ORDER — BUTAMBEN-TETRACAINE-BENZOCAINE 2-2-14 % EX AERO
INHALATION_SPRAY | CUTANEOUS | Status: DC | PRN
  Administered 2014-07-13: 2 via TOPICAL

## 2014-07-13 MED ORDER — SODIUM CHLORIDE 0.9 % IV SOLN
INTRAVENOUS | Status: DC
  Administered 2014-07-13: 500 mL via INTRAVENOUS

## 2014-07-13 MED ORDER — LIDOCAINE HCL (CARDIAC) 20 MG/ML IV SOLN
INTRAVENOUS | Status: DC | PRN
  Administered 2014-07-13: 40 mg via INTRAVENOUS

## 2014-07-13 MED ORDER — SODIUM CHLORIDE 0.9 % IV SOLN
INTRAVENOUS | Status: DC | PRN
  Administered 2014-07-13: 12:00:00 via INTRAVENOUS

## 2014-07-13 MED ORDER — PROPOFOL INFUSION 10 MG/ML OPTIME
INTRAVENOUS | Status: DC | PRN
  Administered 2014-07-13: 100 ug/kg/min via INTRAVENOUS

## 2014-07-13 NOTE — Interval H&P Note (Signed)
History and Physical Interval Note:  07/13/2014 8:37 AM  Shaun Mclaughlin  has presented today for surgery, with the diagnosis of afib  The various methods of treatment have been discussed with the patient and family. After consideration of risks, benefits and other options for treatment, the patient has consented to  Procedure(s): CARDIOVERSION (N/A) as a surgical intervention .  The patient's history has been reviewed, patient examined, no change in status, stable for surgery.  I have reviewed the patient's chart and labs.  Questions were answered to the patient's satisfaction.     Stefano Trulson

## 2014-07-13 NOTE — Op Note (Signed)
INDICATIONS: symptomatic persistent atrial fibrillation   PROCEDURE:   Informed consent was obtained prior to the procedure. The risks, benefits and alternatives for the procedure were discussed and the patient comprehended these risks.  Risks include, but are not limited to, cough, sore throat, vomiting, nausea, somnolence, esophageal and stomach trauma or perforation, bleeding, low blood pressure, aspiration, pneumonia, infection, trauma to the teeth and death.    After a procedural time-out, the oropharynx was anesthetized with 20% benzocaine spray. The patient was sedated with IVB propofol by Anesthesiology.   The transesophageal probe was inserted in the esophagus and stomach without difficulty and multiple views were obtained.  The patient was kept under observation until the patient left the procedure room.  The patient left the procedure room in stable condition.   Agitated microbubble saline contrast was not administered.  COMPLICATIONS:    There were no immediate complications.  FINDINGS:  No LA appendage thrombus. Moderate LA and appendage "smoke". LVEF 35%  RECOMMENDATIONS:    Cardioversion  Time Spent Directly with the Patient:  30 minutes   Ayn Domangue 07/13/2014, 12:19 PM

## 2014-07-13 NOTE — Progress Notes (Signed)
INR on 06/09/14 subtherapeutic at 1.64. Will perform TEE before cardioversion.  Sanda Klein, MD, [facility] [phone] office [phone] pager

## 2014-07-13 NOTE — Op Note (Signed)
Procedure: Electrical Cardioversion Indications:  Atrial Fibrillation  Procedure Details:  Consent: Risks of procedure as well as the alternatives and risks of each were explained to the (patient/caregiver).  Consent for procedure obtained.  Time Out: Verified patient identification, verified procedure, site/side was marked, verified correct patient position, special equipment/implants available, medications/allergies/relevent history reviewed, required imaging and test results available.  Performed  Patient placed on cardiac monitor, pulse oximetry, supplemental oxygen as necessary.  Sedation given: IV propofol, Anesthesiology Pacer pads placed anterior and posterior chest.  Cardioverted 1 time(s).  Cardioverted at 120J. Synchronized biphasic  Evaluation: Findings: Post procedure EKG shows: sinus bradycardia with PACs Complications: None Patient did tolerate procedure well.  Time Spent Directly with the Patient:  30 minutes   Shaun Mclaughlin 07/13/2014, 12:21 PM

## 2014-07-13 NOTE — Transfer of Care (Signed)
Immediate Anesthesia Transfer of Care Note  Patient: Shaun Mclaughlin  Procedure(s) Performed: Procedure(s): CARDIOVERSION (N/A)  Patient Location: PACU and Endoscopy Unit  Anesthesia Type:General  Level of Consciousness: patient cooperative and responds to stimulation  Airway & Oxygen Therapy: Patient Spontanous Breathing and Patient connected to nasal cannula oxygen  Post-op Assessment: Report given to PACU RN and Post -op Vital signs reviewed and stable  Post vital signs: Reviewed and stable  Complications: No apparent anesthesia complications

## 2014-07-13 NOTE — H&P (View-Only) (Signed)
Patient ID: BROK STOCKING, male   DOB: 1934-09-22, 79 y.o.   MRN: [id number]      Reason for office visit Paroxysmal atrial fibrillation , CAD, PAD  [name] shows substantial improvement in his lower extremity edema after institution of diuretic therapy. His weight has decreased by roughly 7 pounds since his last appointment in October and is similar to what it was in April. He has mild shortness of breath but doesn't think this is much different from his baseline. He continues to smoke roughly 5 cigarettes a day. He has not had any angina or arm claudication.  His rhythm today is atrial flutter with roughly 4:1 AV block and frequent PVCs in a pattern of quadrigeminy . On therapeutic levels of warfarin anticoagulation, with INR that is always in the target range. He has not had any bleeding complications.  Follow-up echocardiogram shows maybe a slight reduction in overall left ventricular systolic function. The ejection fraction was estimated to be 45-50 percent, previously 50-55 percent by echo and 43% by nuclear stress testing. He has an inferior wall scar by nuclear perfusion images, assistant with his right coronary problems in the past..  He has paroxysmal atrial fibrillation and takes sotalol and warfarin. He does not have a history of stroke or TIA or other embolic event and has not had bleeding complications. He has coronary artery disease and previously underwent placement of a stent to the right coronary artery in 2003, or coronaries patent by cardiac catheterization in 2013. He has hypertension, diabetes mellitus and hyperlipidemia all of which are well compensated usually. He has peripheral arterial disease and received a stent to the distal left subclavian artery in January 2014 for left arm claudication (10 x 40 mm Cordis Smart nitinol). He is also known to have a 50-60% left renal artery stenosis.   No Known Allergies  Current Outpatient Prescriptions  Medication Sig Dispense Refill    . Ascorbic Acid (VITAMIN C WITH ROSE HIPS) 1000 MG tablet Take 1,000 mg by mouth daily.    Marland Kitchen aspirin EC 81 MG tablet Take 81 mg by mouth daily.    Marland Kitchen atorvastatin (LIPITOR) 40 MG tablet Take 1 tablet (40 mg total) by mouth daily. 30 tablet 11  . brimonidine (ALPHAGAN) 0.15 % ophthalmic solution   0  . furosemide (LASIX) 40 MG tablet Take 1 tablet (40 mg total) by mouth daily. 30 tablet 6  . KRILL OIL PO Take by mouth daily.    Marland Kitchen latanoprost (XALATAN) 0.005 % ophthalmic solution Place 1 drop into both eyes at bedtime.    Marland Kitchen lisinopril (PRINIVIL,ZESTRIL) 20 MG tablet take 1 tablet by mouth once daily 30 tablet 6  . loratadine (CLARITIN) 10 MG tablet Take 10 mg by mouth daily.    . metFORMIN (GLUCOPHAGE-XR) 500 MG 24 hr tablet Take 500 mg by mouth daily with breakfast.    . Multiple Vitamin (MULTIVITAMIN WITH MINERALS) TABS Take 1 tablet by mouth daily.    Marland Kitchen omeprazole (PRILOSEC) 20 MG capsule Take 20 mg by mouth daily.    . potassium chloride SA (K-DUR,KLOR-CON) 20 MEQ tablet Take 1 tablet (20 mEq total) by mouth daily. 30 tablet 6  . propylthiouracil (PTU) 50 MG tablet Take 50 mg by mouth daily.    Marland Kitchen SOTALOL AF 120 MG TABS Take 0.5 tablets by mouth 2 (two) times daily. 30 each 5  . warfarin (COUMADIN) 5 MG tablet Take 1 to 1.5 tablets by mouth daily as directed by coumadin clinic 120 tablet  1   No current facility-administered medications for this visit.    Past Medical History  Diagnosis Date  . Hypercholesteremia   . Pneumonia ~ 2008  . Chronic bronchitis     "about q yr" (08/04/2012)  . Hyperthyroidism   . Type II diabetes mellitus     "take RX; I'm prediabetic; don't have to  check my CBG qd; keep my weight down" (08/04/2012)  . GERD (gastroesophageal reflux disease)   . External bleeding hemorrhoids ~ 2003; 2008; 2013    "removed polyps and no bleeding since" (08/04/2012)  . Arthritis     "maybe a little bit in some joints" (08/04/2012)  . Kidney stone 1980's  . PVD (peripheral  vascular disease) with claudication, Lt Upper ext. pain, and known 80-90%distal Lt. subclavian artery stenosis  08/05/2012  . CAD (coronary artery disease),Hx RCA stenting-patent 06/2012  08/05/2012  . PAF (paroxysmal atrial fibrillation), maintaining SR 08/05/2012  . Chronic anticoagulation, coumadin for PAF 08/05/2012  . DM (diabetes mellitus) 08/05/2012  . Hyperlipidemia 08/05/2012  . S/P angioplasty with stent to Lt. subclavian artery -Nitrinol stent 08/04/12 08/05/2012  . HTN (hypertension) 08/05/2012    Past Surgical History  Procedure Laterality Date  . Coronary angioplasty with stent placement  2003    patent 12/13  . Cardiac catheterization  2008 and Dec 2013    patent coronaries  . Tonsillectomy  1940's  . Hernia repair  4098'J    "umbilical" (08/04/1476)  . Inguinal hernia repair  2000's    "right" (08/04/2012)  . Cataract extraction w/ intraocular lens implant      "right eye" (08/04/2012)  . Glaucoma surgery  2000's    "right eye; had laser procedure  to lower the pressure and prevent glaucoma" (08/04/2012)  . Lithotripsy  1980's    "imploded in my kidney; ended up having to be cut open in my back" (08/04/2012)  . Kidney stone surgery  1980's  . Subclavian stent placement  Jan 2014    Lt SCA    Family History  Problem Relation Age of Onset  . Stroke Mother 70  . Coronary artery disease Father 66    History   Social History  . Marital Status: Married    Spouse Name: N/A    Number of Children: N/A  . Years of Education: N/A   Occupational History  . Not on file.   Social History Main Topics  . Smoking status: Current Every Day Smoker -- 0.12 packs/day for 25 years    Types: Cigarettes, Pipe  . Smokeless tobacco: Never Used     Comment: 08/04/2012 "still smoke a cigar q once in awhile"  . Alcohol Use: Yes     Comment: 08/04/2012 "once/month I might have a drink (beer, wine, or margarita)  . Drug Use: No  . Sexual Activity: No   Other Topics Concern  . Not on file    Social History Narrative    Review of systems: Lower extremity edema, occasional cough, mild shortness of breath with exertion The patient specifically denies any chest pain at rest or with exertion, dyspnea at rest or with exertion, orthopnea, paroxysmal nocturnal dyspnea, syncope, palpitations, focal neurological deficits, intermittent claudication, lower extremity edema, unexplained weight gain, cough, hemoptysis or wheezing.  The patient also denies abdominal pain, nausea, vomiting, dysphagia, diarrhea, constipation, polyuria, polydipsia, dysuria, hematuria, frequency, urgency, abnormal bleeding or bruising, fever, chills, unexpected weight changes, mood swings, change in skin or hair texture, change in voice quality, auditory or visual problems,  allergic reactions or rashes, new musculoskeletal complaints other than usual "aches and pains".  PHYSICAL EXAM BP 144/83 mmHg  Pulse 83  Resp 16  Ht 5\' 7"  (1.702 m)  Wt 170 lb 3.2 oz (77.202 kg)  BMI 26.65 kg/m2 General: Alert, oriented x3, no distress  Head: no evidence of trauma, PERRL, EOMI, no exophtalmos or lid lag, no myxedema, no xanthelasma; normal ears, nose and oropharynx  Neck: normal jugular venous pulsations and no hepatojugular reflux; brisk carotid pulses without delay and no carotid bruits  Chest: clear to auscultation, no signs of consolidation by percussion or palpation, normal fremitus, symmetrical and full respiratory excursions  Cardiovascular: normal position and quality of the apical impulse, regular rhythm with ectopy and a pattern of quadrigeminy, normal first and second heart sounds, no murmurs, rubs or gallops  Abdomen: no tenderness or distention, no masses by palpation, no abnormal pulsatility or arterial bruits, normal bowel sounds, no hepatosplenomegaly  Extremities: no clubbing, cyanosis; symmetrical 2+ pitting calf/pretibial edema; 2+ radial, ulnar and brachial pulses bilaterally; 2+ right femoral,  posterior tibial and dorsalis pedis pulses; 2+ left femoral, posterior tibial and dorsalis pedis pulses; no subclavian or femoral bruits  Neurological: grossly nonfocal   EKG: Atrial flutter, probably typical counterclockwise with controlled ventricular rate and frequent monomorphic PVCs in a pattern of quadrigeminy. Due to the incessant flutter waves it is very difficult to accurately measure the QT interval. I think it is probably around 420 ms (corrected for his heart rate of 95 bpm, QTC roughly 500 ms)  Lipid Panel  No results found for: CHOL, TRIG, HDL, CHOLHDL, VLDL, LDLCALC, LDLDIRECT  BMET    Component Value Date/Time   NA 140 08/05/2012 0550   K 4.0 08/05/2012 0550   CL 102 08/05/2012 0550   CO2 28 08/05/2012 0550   GLUCOSE 133* 08/05/2012 0550   BUN 9 08/05/2012 0550   CREATININE 0.74 08/05/2012 0550   CALCIUM 9.0 08/05/2012 0550   GFRNONAA 88* 08/05/2012 0550   GFRAA >90 08/05/2012 0550     ASSESSMENT AND PLAN Persistent atrial fibrillation In our office, this is the third consecutive ECG in atrial fibrillation (April, May and today). He tells me that in his primary care physician's office he was in normal rhythm recently, but when I obtained a copy he was actually in the same rhythm as he is today. When in sinus rhythm he is usually bradycardic in the 50s. He may have settled into a pattern of permanent atrial fibrillation, but today the appearances of typical atrial flutter, suggesting he may still respond to cardioversion. I am reluctant to increase his dose of sotalol since the QT interval appears to be borderline rinses although hard to accurately measure.  I recommended an elective cardioversion. If his QT interval in sinus rhythm permits will then increase the sotalol to 80 mg twice a day. This may cause worsened sinus bradycardia May eventually need a dual-chamber pacemaker if he develops full-blown tachycardia-bradycardia syndrome and is symptomatic. Continue  anticoagulants. He works 4 days a week except for Fridays. We'll bring him back next Friday the 11th for repeat electrocardiogram to make sure he is not returning to sinus rhythm. Tentatively scheduled for elective cardioversion on December 18.  This procedure has been fully reviewed with the patient and informed consent has been obtained.   PVD - s/p LSCA stent 08/04/12  Asymptomatic. Equal blood pressures in left and right upper extremity   CAD,Hx RCA stent Jan '03. patent at cath 06/2012  No  symptoms of angina. Preserved left ventricular systolic function on previous assessments. Recent cath showed widely patent stent. Focus on long-term coronary risk factor modification.  HTN (hypertension)  Well-controlled  Lower extremity edema - combined systolic and diastolic heart failure, acute on chronic Prompt improvement following diuretics is consistent with congestive heart failure. Since his ejection fraction is at most minimally different, I think the persistent arrhythmia is the likely cause. He may therefore benefit from cardioversion. Long-term maintenance of sinus rhythm will be challenging.  Tobacco use He only smokes 5 cigarettes a day, but I encouraged him to quit completely and permanently  Hyperlipidemia excellent lipid parameters except low HDL cholesterol Orders Placed This Encounter  Procedures  . APTT  . Basic metabolic panel  . CBC  . Protime-INR  . TSH   Meds ordered this encounter  Medications  . brimonidine (ALPHAGAN) 0.15 % ophthalmic solution    Sig:     Refill:  0    Porshe Fleagle  Sanda Klein, MD, [facility] [phone] office [phone] pager

## 2014-07-13 NOTE — Anesthesia Postprocedure Evaluation (Signed)
  Anesthesia Post-op Note  Patient: Shaun Mclaughlin  Procedure(s) Performed: Procedure(s): CARDIOVERSION (N/A)  Patient Location: PACU  Anesthesia Type: MAC  Level of Consciousness: awake and alert   Airway and Oxygen Therapy: Patient Spontanous Breathing  Post-op Pain: none  Post-op Assessment: Post-op Vital signs reviewed, Patient's Cardiovascular Status Stable and Respiratory Function Stable  Post-op Vital Signs: Reviewed  Filed Vitals:   07/13/14 1258  BP: 118/55  Pulse: 37  Temp:   Resp: 20    Complications: No apparent anesthesia complications

## 2014-07-13 NOTE — Anesthesia Preprocedure Evaluation (Addendum)
Anesthesia Evaluation  Patient identified by MRN, date of birth, ID band Patient awake    Reviewed: Allergy & Precautions, H&P , NPO status , Patient's Chart, lab work & pertinent test results  Airway Mallampati: II  TM Distance: >3 FB Neck ROM: Full    Dental no notable dental hx. (+) Teeth Intact, Dental Advisory Given   Pulmonary neg pulmonary ROS, Current Smoker,  breath sounds clear to auscultation  Pulmonary exam normal       Cardiovascular hypertension, Pt. on medications + CAD, + Cardiac Stents, + Peripheral Vascular Disease and +CHF + dysrhythmias Atrial Fibrillation Rhythm:Irregular Rate:Normal     Neuro/Psych negative neurological ROS  negative psych ROS   GI/Hepatic Neg liver ROS, GERD-  Medicated and Controlled,  Endo/Other  diabetes, Type 2, Oral Hypoglycemic AgentsHyperthyroidism   Renal/GU Renal disease  negative genitourinary   Musculoskeletal  (+) Arthritis -, Osteoarthritis,    Abdominal   Peds  Hematology negative hematology ROS (+)   Anesthesia Other Findings   Reproductive/Obstetrics negative OB ROS                            Anesthesia Physical Anesthesia Plan  ASA: III  Anesthesia Plan: MAC   Post-op Pain Management:    Induction: Intravenous  Airway Management Planned: Mask  Additional Equipment:   Intra-op Plan:   Post-operative Plan:   Informed Consent: I have reviewed the patients History and Physical, chart, labs and discussed the procedure including the risks, benefits and alternatives for the proposed anesthesia with the patient or authorized representative who has indicated his/her understanding and acceptance.   Dental advisory given  Plan Discussed with: CRNA  Anesthesia Plan Comments:        Anesthesia Quick Evaluation

## 2014-07-13 NOTE — Discharge Instructions (Signed)
Conscious Sedation, Adult, Care After °Refer to this sheet in the next few weeks. These instructions provide you with information on caring for yourself after your procedure. Your health care provider may also give you more specific instructions. Your treatment has been planned according to current medical practices, but problems sometimes occur. Call your health care provider if you have any problems or questions after your procedure. °WHAT TO EXPECT AFTER THE PROCEDURE  °After your procedure: °· You may feel sleepy, clumsy, and have poor balance for several hours. °· Vomiting may occur if you eat too soon after the procedure. °HOME CARE INSTRUCTIONS °· Do not participate in any activities where you could become injured for at least 24 hours. Do not: °¨ Drive. °¨ Swim. °¨ Ride a bicycle. °¨ Operate heavy machinery. °¨ Cook. °¨ Use power tools. °¨ Climb ladders. °¨ Work from a high place. °· Do not make important decisions or sign legal documents until you are improved. °· If you vomit, drink water, juice, or soup when you can drink without vomiting. Make sure you have little or no nausea before eating solid foods. °· Only take over-the-counter or prescription medicines for pain, discomfort, or fever as directed by your health care provider. °· Make sure you and your family fully understand everything about the medicines given to you, including what side effects may occur. °· You should not drink alcohol, take sleeping pills, or take medicines that cause drowsiness for at least 24 hours. °· If you smoke, do not smoke without supervision. °· If you are feeling better, you may resume normal activities 24 hours after you were sedated. °· Keep all appointments with your health care provider. °SEEK MEDICAL CARE IF: °· Your skin is pale or bluish in color. °· You continue to feel nauseous or vomit. °· Your pain is getting worse and is not helped by medicine. °· You have bleeding or swelling. °· You are still sleepy or  feeling clumsy after 24 hours. °SEEK IMMEDIATE MEDICAL CARE IF: °· You develop a rash. °· You have difficulty breathing. °· You develop any type of allergic problem. °· You have a fever. °MAKE SURE YOU: °· Understand these instructions. °· Will watch your condition. °· Will get help right away if you are not doing well or get worse. °Document Released: 05/03/2013 Document Reviewed: 05/03/2013 °[employer]® Patient Information ©2015 [employer]. This information is not intended to replace advice given to you by your health care provider. Make sure you discuss any questions you have with your health care provider. °Transesophageal Echocardiogram °Transesophageal echocardiography (TEE) is a picture test of your heart using sound waves. The pictures taken can give very detailed pictures of your heart. This can help your doctor see if there are problems with your heart. TEE can check: °· If your heart has blood clots in it. °· How well your heart valves are working. °· If you have an infection on the inside of your heart. °· Some of the major arteries of your heart. °· If your heart valve is working after a repair. °· Your heart before a procedure that uses a shock to your heart to get the rhythm back to normal. °BEFORE THE PROCEDURE °· Do not eat or drink for 6 hours before the procedure or as told by your doctor. °· Make plans to have someone drive you home after the procedure. Do not drive yourself home. °· An IV tube will be put in your arm. °PROCEDURE °· You will be given a   medicine to help you relax (sedative). It will be given through the IV tube.  A numbing medicine will be sprayed or gargled in the back of your throat to help numb it.  The tip of the probe is placed into the back of your mouth. You will be asked to swallow. This helps to pass the probe into your esophagus.  Once the tip of the probe is in the right place, your doctor can take pictures of your heart.  You may feel pressure at the back of  your throat. AFTER THE PROCEDURE  You will be taken to a recovery area so the sedative can wear off.  Your throat may be sore and scratchy. This will go away slowly over time.  You will go home when you are fully awake and able to swallow liquids.  You should have someone stay with you for the next 24 hours.  Do not drive or operate machinery for the next 24 hours. Document Released: 05/10/2009 Document Revised: 07/18/2013 Document Reviewed: 01/12/2013 [facility] Patient Information 2015 [place], [place]. This information is not intended to replace advice given to you by your health care provider. Make sure you discuss any questions you have with your health care provider. Electrical Cardioversion, Care After Refer to this sheet in the next few weeks. These instructions provide you with information on caring for yourself after your procedure. Your health care provider may also give you more specific instructions. Your treatment has been planned according to current medical practices, but problems sometimes occur. Call your health care provider if you have any problems or questions after your procedure. WHAT TO EXPECT AFTER THE PROCEDURE After your procedure, it is typical to have the following sensations:  Some redness on the skin where the shocks were delivered. If this is tender, a sunburn lotion or hydrocortisone cream may help.  Possible return of an abnormal heart rhythm within hours or days after the procedure. HOME CARE INSTRUCTIONS  Take medicines only as directed by your health care provider. Be sure you understand how and when to take your medicine.  Learn how to feel your pulse and check it often.  Limit your activity for 48 hours after the procedure or as directed by your health care provider.  Avoid or minimize caffeine and other stimulants as directed by your health care provider. SEEK MEDICAL CARE IF:  You feel like your heart is beating too fast or your pulse is not  regular.  You have any questions about your medicines.  You have bleeding that will not stop. SEEK IMMEDIATE MEDICAL CARE IF:  You are dizzy or feel faint.  It is hard to breathe or you feel short of breath.  There is a change in discomfort in your chest.  Your speech is slurred or you have trouble moving an arm or leg on one side of your body.  You get a serious muscle cramp that does not go away.  Your fingers or toes turn cold or blue. Document Released: 05/03/2013 Document Revised: 11/27/2013 Document Reviewed: 05/03/2013 [facility] Patient Information 2015 [place], [place]. This information is not intended to replace advice given to you by your health care provider. Make sure you discuss any questions you have with your health care provider.

## 2014-07-13 NOTE — Progress Notes (Signed)
  Echocardiogram 2D Echocardiogram has been performed.  Shaun Mclaughlin 07/13/2014, 12:21 PM

## 2014-07-16 ENCOUNTER — Telehealth: Payer: Self-pay | Admitting: Cardiovascular Disease

## 2014-07-16 ENCOUNTER — Encounter (HOSPITAL_COMMUNITY): Payer: Self-pay | Admitting: Cardiovascular Disease

## 2014-07-16 NOTE — Telephone Encounter (Signed)
Closed encounter °

## 2014-07-17 NOTE — Addendum Note (Signed)
Addended byChauncy Lean. on: 07/17/2014 11:25 AM   Modules accepted: Orders

## 2014-07-18 ENCOUNTER — Telehealth: Payer: Self-pay | Admitting: Cardiovascular Disease

## 2014-07-18 NOTE — Telephone Encounter (Signed)
Close encounter 

## 2014-08-07 ENCOUNTER — Ambulatory Visit: Payer: Medicare Other | Admitting: Cardiology

## 2014-08-10 ENCOUNTER — Ambulatory Visit: Payer: Medicare Other | Admitting: Physician Assistant

## 2014-08-10 ENCOUNTER — Ambulatory Visit (INDEPENDENT_AMBULATORY_CARE_PROVIDER_SITE_OTHER): Payer: Medicare Other | Admitting: Pharmacist Clinician (PhC)/ Clinical Pharmacy Specialist

## 2014-08-10 DIAGNOSIS — Z7901 Long term (current) use of anticoagulants: Secondary | ICD-10-CM

## 2014-08-10 DIAGNOSIS — I480 Paroxysmal atrial fibrillation: Secondary | ICD-10-CM

## 2014-08-10 LAB — POCT INR: INR: 2.7

## 2014-08-13 ENCOUNTER — Ambulatory Visit: Payer: Medicare Other | Admitting: Physician Assistant

## 2014-08-14 ENCOUNTER — Ambulatory Visit (INDEPENDENT_AMBULATORY_CARE_PROVIDER_SITE_OTHER): Payer: Medicare Other | Admitting: Physician Assistant

## 2014-08-14 ENCOUNTER — Encounter: Payer: Self-pay | Admitting: Physician Assistant

## 2014-08-14 VITALS — BP 122/82 | HR 90 | Ht 67.0 in | Wt 170.3 lb

## 2014-08-14 DIAGNOSIS — I4892 Unspecified atrial flutter: Secondary | ICD-10-CM | POA: Insufficient documentation

## 2014-08-14 DIAGNOSIS — I2583 Coronary atherosclerosis due to lipid rich plaque: Secondary | ICD-10-CM

## 2014-08-14 DIAGNOSIS — I5032 Chronic diastolic (congestive) heart failure: Secondary | ICD-10-CM | POA: Insufficient documentation

## 2014-08-14 DIAGNOSIS — I10 Essential (primary) hypertension: Secondary | ICD-10-CM

## 2014-08-14 DIAGNOSIS — I5042 Chronic combined systolic (congestive) and diastolic (congestive) heart failure: Secondary | ICD-10-CM

## 2014-08-14 DIAGNOSIS — Z7901 Long term (current) use of anticoagulants: Secondary | ICD-10-CM

## 2014-08-14 DIAGNOSIS — I2510 Atherosclerotic heart disease of native coronary artery without angina pectoris: Secondary | ICD-10-CM

## 2014-08-14 NOTE — Assessment & Plan Note (Signed)
Status post TEE revealing ejection fraction of 30 to 35%.  Which is in reduction from his transthoracic echo November of 45-50%.  At this time the patient appears euvolemic. He only has some trace lower extremity edema.  He will continue to monitor his weight daily and use Lasix when necessary

## 2014-08-14 NOTE — Assessment & Plan Note (Signed)
No complaints of angina. 

## 2014-08-14 NOTE — Assessment & Plan Note (Signed)
Rate is controlled at 90 bpm. Patient is asymptomatic.  He didn't notice that his heart had gone back out of rhythm post cardioversion.  His QTC while in sinus bradycardia was mildly elevated at 445 ms. For increasing sotalol to Dr. Sallyanne Kuster.

## 2014-08-14 NOTE — Assessment & Plan Note (Signed)
Blood pressure is well controlled 

## 2014-08-14 NOTE — Progress Notes (Signed)
Patient ID: Shaun Mclaughlin, male   DOB: [birth year], 80 y.o.   MRN: [id number]    Date:  08/14/2014   ID:  Shaun Mclaughlin, DOB May 06, 1935, MRN [id number]  PCP:  Gara Kroner, MD  Primary Cardiologist:  Croitoru   Chief Complaint  Patient presents with  . Follow-up    Cardioversion 12/18     History of Present Illness: Shaun Mclaughlin is a 80 y.o. male with paroxysmal atrial fibrillation and takes sotalol and warfarin. He does not have a history of stroke or TIA or other embolic event and has not had bleeding complications. He has coronary artery disease and previously underwent placement of a stent to the right coronary artery in 2003, or coronaries patent by cardiac catheterization in 2013. He has hypertension, diabetes mellitus and hyperlipidemia all of which are well compensated usually. He has peripheral arterial disease and received a stent to the distal left subclavian artery in January 2014 for left arm claudication (10 x 40 mm Cordis Smart nitinol). He is also known to have a 50-60% left renal artery stenosis  On 07/13/2014 the patient underwent TEE and DC CV.  The TEE revealed an ejection fraction of 30-35% which is a reduction from his previous transthoracic echo in November of 45 to 50%. There was diffuse hypokinesis. Right atrium was moderately dilated. There was no thrombus in the underwent cardioversion to sinus bradycardia.  Patient presents today for follow-up reports he hasn't noticed any difference he didn't realize that he had gone back rhythm.  The patient currently denies nausea, vomiting, fever, chest pain, shortness of breath, orthopnea, dizziness, PND, cough, congestion, abdominal pain, hematochezia, melena, lower extremity edema..  Wt Readings from Last 3 Encounters:  08/14/14 170 lb 4.8 oz (77.248 kg)  07/13/14 165 lb (74.844 kg)  06/29/14 170 lb 3.2 oz (77.202 kg)     Past Medical History  Diagnosis Date  . Hypercholesteremia   . Pneumonia ~ 2008  . Chronic  bronchitis     "about q yr" (08/04/2012)  . Hyperthyroidism   . Type II diabetes mellitus     "take RX; I'm prediabetic; don't have to  check my CBG qd; keep my weight down" (08/04/2012)  . GERD (gastroesophageal reflux disease)   . External bleeding hemorrhoids ~ 2003; 2008; 2013    "removed polyps and no bleeding since" (08/04/2012)  . Arthritis     "maybe a little bit in some joints" (08/04/2012)  . Kidney stone 1980's  . PVD (peripheral vascular disease) with claudication, Lt Upper ext. pain, and known 80-90%distal Lt. subclavian artery stenosis  08/05/2012  . CAD (coronary artery disease),Hx RCA stenting-patent 06/2012  08/05/2012  . PAF (paroxysmal atrial fibrillation), maintaining SR 08/05/2012  . Chronic anticoagulation, coumadin for PAF 08/05/2012  . DM (diabetes mellitus) 08/05/2012  . Hyperlipidemia 08/05/2012  . S/P angioplasty with stent to Lt. subclavian artery -Nitrinol stent 08/04/12 08/05/2012  . HTN (hypertension) 08/05/2012  . Acute on chronic combined systolic and diastolic congestive heart failure, NYHA class 2 06/29/2014    Current Outpatient Prescriptions  Medication Sig Dispense Refill  . Ascorbic Acid (VITAMIN C WITH ROSE HIPS) 1000 MG tablet Take 1,000 mg by mouth daily.    Marland Kitchen aspirin EC 81 MG tablet Take 81 mg by mouth daily.    Marland Kitchen atorvastatin (LIPITOR) 40 MG tablet Take 1 tablet (40 mg total) by mouth daily. 30 tablet 11  . furosemide (LASIX) 40 MG tablet Take 1 tablet (40 mg total) by mouth daily. (  Patient taking differently: Take 40 mg by mouth daily as needed for fluid. ) 30 tablet 6  . ketorolac (ACULAR) 0.5 % ophthalmic solution Place 1 drop into the left eye 4 (four) times daily.    Marland Kitchen KRILL OIL PO Take by mouth daily.    Marland Kitchen latanoprost (XALATAN) 0.005 % ophthalmic solution Place 1 drop into both eyes at bedtime.    Marland Kitchen lisinopril (PRINIVIL,ZESTRIL) 20 MG tablet take 1 tablet by mouth once daily 30 tablet 6  . loratadine (CLARITIN) 10 MG tablet Take 10 mg by mouth daily.      . metFORMIN (GLUCOPHAGE-XR) 500 MG 24 hr tablet Take 500 mg by mouth daily with breakfast.    . Multiple Vitamin (MULTIVITAMIN WITH MINERALS) TABS Take 1 tablet by mouth daily.    Marland Kitchen omeprazole (PRILOSEC) 20 MG capsule Take 20 mg by mouth daily.    . potassium chloride SA (K-DUR,KLOR-CON) 20 MEQ tablet Take 1 tablet (20 mEq total) by mouth daily. (Patient taking differently: Take 20 mEq by mouth daily as needed. ) 30 tablet 6  . prednisoLONE (PRELONE) 15 MG/5ML SOLN Place 5 mg into the left eye 2 (two) times daily.     Marland Kitchen propylthiouracil (PTU) 50 MG tablet Take 50 mg by mouth daily.    Marland Kitchen SOTALOL AF 120 MG TABS Take 0.5 tablets by mouth 2 (two) times daily. 30 each 5  . warfarin (COUMADIN) 5 MG tablet Take 1 to 1.5 tablets by mouth daily as directed by coumadin clinic (Patient taking differently: Take 5-7.5 mg by mouth daily. Take 7.5 mg on Monday and Friday and 5 mg all other days) 120 tablet 1   No current facility-administered medications for this visit.    Allergies:   No Known Allergies  Social History:  The patient  reports that he has been smoking Cigarettes and Pipe.  He has a 3 pack-year smoking history. He has never used smokeless tobacco. He reports that he drinks alcohol. He reports that he does not use illicit drugs.   Family history:   Family History  Problem Relation Age of Onset  . Stroke Mother 37  . Coronary artery disease Father 57    ROS:  Please see the history of present illness.  All other systems reviewed and negative.   PHYSICAL EXAM: VS:  BP 122/82 mmHg  Pulse 90  Ht 5\' 7"  (1.702 m)  Wt 170 lb 4.8 oz (77.248 kg)  BMI 26.67 kg/m2 Well nourished, well developed, in no acute distress HEENT: Pupils are equal round react to light accommodation extraocular movements are intact.  Neck: no JVDNo cervical lymphadenopathy. Cardiac: Regular rate and rhythm without murmurs rubs or gallops. Lungs:  clear to auscultation bilaterally, no wheezing, rhonchi or rales Abd:  soft, nontender, positive bowel sounds all quadrants, no hepatosplenomegaly Ext: Trace lower extremity edema.  2+ radial and dorsalis pedis pulses. Skin: warm and dry Neuro:  Grossly normal  EKG:  Atrial flutter 3-1. Rate 90 bpm.  ASSESSMENT AND PLAN:  Problem List Items Addressed This Visit    HTN (hypertension) (Chronic)    Blood pressure is well-controlled.      Chronic combined systolic and diastolic heart failure, NYHA class 2    Status post TEE revealing ejection fraction of 30 to 35%.  Which is in reduction from his transthoracic echo November of 45-50%.  At this time the patient appears euvolemic. He only has some trace lower extremity edema.  He will continue to monitor his weight daily and  use Lasix when necessary      Chronic anticoagulation, coumadin for PAF (Chronic)    INR on January 15 was 2.7.      CAD,Hx RCA stent Jan '03. patent at cath 06/2012  (Chronic)    No complaints of angina      Atrial flutter - Primary    Rate is controlled at 90 bpm. Patient is asymptomatic.  He didn't notice that his heart had gone back out of rhythm post cardioversion.  His QTC while in sinus bradycardia was mildly elevated at 445 ms.  I will defer increasing sotalol to Dr. Sallyanne Kuster.

## 2014-08-14 NOTE — Patient Instructions (Signed)
Your physician recommends that you schedule a follow-up appointment in: 1 month with Dr.Croitoru

## 2014-08-14 NOTE — Assessment & Plan Note (Signed)
INR on January 15 was 2.7.

## 2014-09-21 ENCOUNTER — Encounter: Payer: Self-pay | Admitting: Cardiovascular Disease

## 2014-09-21 ENCOUNTER — Ambulatory Visit (INDEPENDENT_AMBULATORY_CARE_PROVIDER_SITE_OTHER): Payer: Medicare Other | Admitting: Pharmacist Clinician (PhC)/ Clinical Pharmacy Specialist

## 2014-09-21 ENCOUNTER — Ambulatory Visit (INDEPENDENT_AMBULATORY_CARE_PROVIDER_SITE_OTHER): Payer: Medicare Other | Admitting: Cardiovascular Disease

## 2014-09-21 VITALS — BP 152/60 | HR 89 | Resp 16 | Ht 66.0 in | Wt 168.8 lb

## 2014-09-21 DIAGNOSIS — I480 Paroxysmal atrial fibrillation: Secondary | ICD-10-CM

## 2014-09-21 DIAGNOSIS — Z7901 Long term (current) use of anticoagulants: Secondary | ICD-10-CM | POA: Diagnosis not present

## 2014-09-21 DIAGNOSIS — I4819 Other persistent atrial fibrillation: Secondary | ICD-10-CM

## 2014-09-21 DIAGNOSIS — R5383 Other fatigue: Secondary | ICD-10-CM

## 2014-09-21 DIAGNOSIS — I4892 Unspecified atrial flutter: Secondary | ICD-10-CM

## 2014-09-21 DIAGNOSIS — I481 Persistent atrial fibrillation: Secondary | ICD-10-CM

## 2014-09-21 DIAGNOSIS — Z79899 Other long term (current) drug therapy: Secondary | ICD-10-CM

## 2014-09-21 LAB — CBC
HCT: 43.5 % (ref 39.0–52.0)
Hemoglobin: 14.8 g/dL (ref 13.0–17.0)
MCH: 32.9 pg (ref 26.0–34.0)
MCHC: 34 g/dL (ref 30.0–36.0)
MCV: 96.7 fL (ref 78.0–100.0)
MPV: 11.1 fL (ref 8.6–12.4)
PLATELETS: 166 10*3/uL (ref 150–400)
RBC: 4.5 MIL/uL (ref 4.22–5.81)
RDW: 17.2 % — ABNORMAL HIGH (ref 11.5–15.5)
WBC: 5.9 10*3/uL (ref 4.0–10.5)

## 2014-09-21 LAB — PROTIME-INR
INR: 2.23 — ABNORMAL HIGH (ref <1.50)
Prothrombin Time: 24.7 seconds — ABNORMAL HIGH (ref 11.6–15.2)

## 2014-09-21 LAB — COMPREHENSIVE METABOLIC PANEL
ALK PHOS: 86 U/L (ref 39–117)
ALT: 15 U/L (ref 0–53)
AST: 20 U/L (ref 0–37)
Albumin: 4 g/dL (ref 3.5–5.2)
BILIRUBIN TOTAL: 2.3 mg/dL — AB (ref 0.2–1.2)
BUN: 15 mg/dL (ref 6–23)
CO2: 28 meq/L (ref 19–32)
CREATININE: 0.86 mg/dL (ref 0.50–1.35)
Calcium: 8.8 mg/dL (ref 8.4–10.5)
Chloride: 99 mEq/L (ref 96–112)
GLUCOSE: 106 mg/dL — AB (ref 70–99)
Potassium: 4.4 mEq/L (ref 3.5–5.3)
Sodium: 138 mEq/L (ref 135–145)
Total Protein: 7.3 g/dL (ref 6.0–8.3)

## 2014-09-21 LAB — POCT INR: INR: 2.3

## 2014-09-21 LAB — APTT: aPTT: 51 seconds — ABNORMAL HIGH (ref 24–37)

## 2014-09-21 MED ORDER — SOTALOL HCL 80 MG PO TABS: 80.0000 mg | ORAL_TABLET | Freq: Two times a day (BID) | ORAL | Status: DC

## 2014-09-21 NOTE — Patient Instructions (Addendum)
INCREASE Sotalol to 80mg  every 12 hours.  Your physician has recommended that you have a Cardioversion (DCCV) Friday September 28, 2014 BY ONE OF MY PARTNERS. Electrical Cardioversion uses a jolt of electricity to your heart either through paddles or wired patches attached to your chest. This is a controlled, usually prescheduled, procedure. Defibrillation is done under light anesthesia in the hospital, and you usually go home the day of the procedure. This is done to get your heart back into a normal rhythm. You are not awake for the procedure. Please see the instruction sheet given to you today.  Your physician recommends that you return for lab work in: [place].

## 2014-09-21 NOTE — Progress Notes (Signed)
Patient ID: Shaun Mclaughlin, male   DOB: [birth year], 80 y.o.   MRN: [id number]      Reason for office visit Persistent atrial fibrillation, CAD, ischemic cardiomyopathy  Shaun Mclaughlin remains in persistent atrial flutter that is reasonably well rate controlled. Today he has 3:1 conduction with a ventricular rate of 89 bpm. The flutter waves are upright in lead V1 and predominantly a downward deflection in the inferior leads, suggestive of typical counterclockwise atrial flutter.  He continues to complain of fatigue but denies chest pain or dyspnea.  The QT interval after his successful cardioversion in December, while in sinus rhythm, was 455 ms. It is hard to accurately measure his QT interval on today's tracing due to the atrial flutter but the uncorrected QT seems to be roughly 360 ms, correcting to roughly 440 ms.  He had paroxysmal atrial fibrillation for years that was well-controlled with sotalol and warfarin. Now has recurrent persistent atrial fibrillation. He does not have a history of stroke or TIA or other embolic event and has not had bleeding complications. He has coronary artery disease and previously underwent placement of a stent to the right coronary artery in 2003, coronaries patent by cardiac catheterization in 2013. He has hypertension, diabetes mellitus and hyperlipidemia all of which are well compensated usually. He has peripheral arterial disease and received a stent to the distal left subclavian artery in January 2014 for left arm claudication (10 x 40 mm Cordis Smart nitinol). He is also known to have a 50-60% left renal artery stenosis. Echocardiogram and nuclear stress testing have shown reduction in LV systolic function (32% by scintigraphy, 45-50 percent by transthoracic echocardiography, 35% by transesophageal echo at the time of his cardioversion). Nuclear stress testing shows an inferior wall scar without reversible ischemia (Nov 2013). No Known Allergies  Current Outpatient  Prescriptions  Medication Sig Dispense Refill  . Ascorbic Acid (VITAMIN C WITH ROSE HIPS) 1000 MG tablet Take 1,000 mg by mouth daily.    Marland Kitchen aspirin EC 81 MG tablet Take 81 mg by mouth daily.    Marland Kitchen atorvastatin (LIPITOR) 40 MG tablet Take 1 tablet (40 mg total) by mouth daily. 30 tablet 11  . furosemide (LASIX) 40 MG tablet Take 1 tablet (40 mg total) by mouth daily. (Patient taking differently: Take 40 mg by mouth daily as needed for fluid. ) 30 tablet 6  . ketorolac (ACULAR) 0.5 % ophthalmic solution Place 1 drop into the left eye 4 (four) times daily.    Marland Kitchen KRILL OIL PO Take by mouth daily.    Marland Kitchen latanoprost (XALATAN) 0.005 % ophthalmic solution Place 1 drop into both eyes at bedtime.    Marland Kitchen lisinopril (PRINIVIL,ZESTRIL) 20 MG tablet take 1 tablet by mouth once daily 30 tablet 6  . loratadine (CLARITIN) 10 MG tablet Take 10 mg by mouth daily as needed.     . metFORMIN (GLUCOPHAGE-XR) 500 MG 24 hr tablet Take 500 mg by mouth daily with breakfast.    . Multiple Vitamin (MULTIVITAMIN WITH MINERALS) TABS Take 1 tablet by mouth daily.    Marland Kitchen omeprazole (PRILOSEC) 20 MG capsule Take 20 mg by mouth daily.    . potassium chloride SA (K-DUR,KLOR-CON) 20 MEQ tablet Take 1 tablet (20 mEq total) by mouth daily. (Patient taking differently: Take 20 mEq by mouth daily as needed. ) 30 tablet 6  . propylthiouracil (PTU) 50 MG tablet Take 50 mg by mouth daily.    . sotalol (BETAPACE) 80 MG tablet Take 1 tablet (80  mg total) by mouth 2 (two) times daily. 60 tablet 6  . warfarin (COUMADIN) 5 MG tablet Take 1 to 1.5 tablets by mouth daily as directed by coumadin clinic (Patient taking differently: Take 5-7.5 mg by mouth daily. Take 7.5 mg on Monday and Friday and 5 mg all other days) 120 tablet 1   No current facility-administered medications for this visit.    Past Medical History  Diagnosis Date  . Hypercholesteremia   . Pneumonia ~ 2008  . Chronic bronchitis     "about q yr" (08/04/2012)  . Hyperthyroidism   .  Type II diabetes mellitus     "take RX; I'm prediabetic; don't have to  check my CBG qd; keep my weight down" (08/04/2012)  . GERD (gastroesophageal reflux disease)   . External bleeding hemorrhoids ~ 2003; 2008; 2013    "removed polyps and no bleeding since" (08/04/2012)  . Arthritis     "maybe a little bit in some joints" (08/04/2012)  . Kidney stone 1980's  . PVD (peripheral vascular disease) with claudication, Lt Upper ext. pain, and known 80-90%distal Lt. subclavian artery stenosis  08/05/2012  . CAD (coronary artery disease),Hx RCA stenting-patent 06/2012  08/05/2012  . PAF (paroxysmal atrial fibrillation), maintaining SR 08/05/2012  . Chronic anticoagulation, coumadin for PAF 08/05/2012  . DM (diabetes mellitus) 08/05/2012  . Hyperlipidemia 08/05/2012  . S/P angioplasty with stent to Lt. subclavian artery -Nitrinol stent 08/04/12 08/05/2012  . HTN (hypertension) 08/05/2012  . Acute on chronic combined systolic and diastolic congestive heart failure, NYHA class 2 06/29/2014    Past Surgical History  Procedure Laterality Date  . Coronary angioplasty with stent placement  2003    patent 12/13  . Cardiac catheterization  2008 and Dec 2013    patent coronaries  . Tonsillectomy  1940's  . Hernia repair  4496'P    "umbilical" (12/02/1636)  . Inguinal hernia repair  2000's    "right" (08/04/2012)  . Cataract extraction w/ intraocular lens implant      "right eye" (08/04/2012)  . Glaucoma surgery  2000's    "right eye; had laser procedure  to lower the pressure and prevent glaucoma" (08/04/2012)  . Lithotripsy  1980's    "imploded in my kidney; ended up having to be cut open in my back" (08/04/2012)  . Kidney stone surgery  1980's  . Subclavian stent placement  Jan 2014    Lt SCA  . Left heart catheterization with coronary angiogram N/A 06/28/2012    Procedure: LEFT HEART CATHETERIZATION WITH CORONARY ANGIOGRAM;  Surgeon: Lorretta Harp, MD;  Location: [facility] CATH LAB;  Service: Cardiovascular;  Laterality:  N/A;  . Upper extremity angiogram Bilateral 06/28/2012    Procedure: UPPER EXTREMITY ANGIOGRAM;  Surgeon: Lorretta Harp, MD;  Location: [facility] CATH LAB;  Service: Cardiovascular;  Laterality: Bilateral;  . Unilateral upper extremeity angiogram N/A 08/04/2012    Procedure: UNILATERAL UPPER EXTREMEITY ANGIOGRAM;  Surgeon: Lorretta Harp, MD;  Location: [facility] CATH LAB;  Service: Cardiovascular;  Laterality: N/A;  . Cardioversion N/A 07/13/2014    Procedure: CARDIOVERSION;  Surgeon: Sanda Klein, MD;  Location: [facility] ENDOSCOPY;  Service: Cardiovascular;  Laterality: N/A;    Family History  Problem Relation Age of Onset  . Stroke Mother 64  . Coronary artery disease Father 73    History   Social History  . Marital Status: Married    Spouse Name: N/A  . Number of Children: N/A  . Years of Education: N/A   Occupational History  .  Not on file.   Social History Main Topics  . Smoking status: Current Every Day Smoker -- 0.12 packs/day for 25 years    Types: Cigarettes, Pipe  . Smokeless tobacco: Never Used     Comment: 08/04/2012 "still smoke a cigar q once in awhile"  . Alcohol Use: Yes     Comment: 08/04/2012 "once/month I might have a drink (beer, wine, or margarita)  . Drug Use: No  . Sexual Activity: No   Other Topics Concern  . Not on file   Social History Narrative    Review of systems: Lower extremity edema has resolved, no shortness of breath with exertion The patient specifically denies any chest pain at rest or with exertion, dyspnea at rest or with exertion, orthopnea, paroxysmal nocturnal dyspnea, syncope, palpitations, focal neurological deficits, intermittent claudication, lower extremity edema, unexplained weight gain, cough, hemoptysis or wheezing.  The patient also denies abdominal pain, nausea, vomiting, dysphagia, diarrhea, constipation, polyuria, polydipsia, dysuria, hematuria, frequency, urgency, abnormal bleeding or bruising, fever, chills, unexpected weight changes,  mood swings, change in skin or hair texture, change in voice quality, auditory or visual problems, allergic reactions or rashes, new musculoskeletal complaints other than usual "aches and pains".  PHYSICAL EXAM BP 152/60 mmHg  Pulse 89  Resp 16  Ht 5\' 6"  (1.676 m)  Wt 168 lb 12.8 oz (76.567 kg)  BMI 27.26 kg/m2 General: Alert, oriented x3, no distress  Head: no evidence of trauma, PERRL, EOMI, no exophtalmos or lid lag, no myxedema, no xanthelasma; normal ears, nose and oropharynx  Neck: normal jugular venous pulsations and no hepatojugular reflux; brisk carotid pulses without delay and no carotid bruits  Chest: clear to auscultation, no signs of consolidation by percussion or palpation, normal fremitus, symmetrical and full respiratory excursions  Cardiovascular: normal position and quality of the apical impulse, regular rhythm with ectopy and a pattern of quadrigeminy, normal first and second heart sounds, no murmurs, rubs or gallops  Abdomen: no tenderness or distention, no masses by palpation, no abnormal pulsatility or arterial bruits, normal bowel sounds, no hepatosplenomegaly  Extremities: no clubbing, cyanosis; symmetrical 2+ pitting calf/pretibial edema; 2+ radial, ulnar and brachial pulses bilaterally; 2+ right femoral, posterior tibial and dorsalis pedis pulses; 2+ left femoral, posterior tibial and dorsalis pedis pulses; no subclavian or femoral bruits  Neurological: grossly nonfocal   EKG: Atrial flutter, probably typical counterclockwise with controlled ventricular rate  Lipid Panel  No results found for: CHOL, TRIG, HDL, CHOLHDL, VLDL, LDLCALC, LDLDIRECT  BMET    Component Value Date/Time   NA 140 07/13/2014 1051   K 4.6 07/13/2014 1051   CL 100 07/13/2014 1051   CO2 29 07/13/2014 1051   GLUCOSE 120* 07/13/2014 1051   BUN 14 07/13/2014 1051   CREATININE 0.79 07/13/2014 1051   CREATININE 0.98 07/09/2014 0826   CALCIUM 9.3 07/13/2014 1051   GFRNONAA 84*  07/13/2014 1051   GFRAA >90 07/13/2014 1051     ASSESSMENT AND PLAN Persistent atrial fibrillation Discuss further options. I recommended hospitalization to initiate treatment with dofetilide. He wants to avoid this. His QT interval in sinus rhythm permits increasing the sotalol to 80 mg twice a day. We'll schedule him for another attempt at cardioversion after one week on the increased dose. This may cause worsened sinus bradycardia. May eventually need a dual-chamber pacemaker if he develops full-blown tachycardia-bradycardia syndrome and is symptomatic. Continue anticoagulants. He works 4 days a week except for Fridays.  This procedure has been fully reviewed with the  patient and informed consent has been obtained.  PVD - s/p LSCA stent 08/04/12  Asymptomatic. Equal blood pressures in left and right upper extremity   CAD,Hx RCA stent Jan '03. patent at cath 06/2012  Newly reduced left ventricular systolic function compared to previous assessments, but the pattern of hypokinesis is diffuse and he has not had angina. Might be related to atrial arrhythmia? Most recent cath showed widely patent stent. Focus on long-term coronary risk factor modification.  HTN (hypertension)  Well-controlled  Lower extremity edema - combined systolic and diastolic heart failure, acute on chronic Prompt improvement following diuretics is consistent with congestive heart failure. We'll need to reevaluate left ventricular systolic function once we have had successful maintenance of sinus rhythm for longer period of time. If EF remains low, may need to repeat coronary workup  Tobacco use He only smokes 5 cigarettes a day, but I encouraged him to quit completely and permanently  Hyperlipidemia excellent lipid parameters except low HDL cholesterol Orders Placed This Encounter  Procedures  . Cardioversion (Bedside)  . APTT  . CBC  . Comprehensive metabolic panel  . Protime-INR  . EKG 12-Lead   Meds  ordered this encounter  Medications  . DISCONTD: sotalol (BETAPACE) 80 MG tablet    Sig: Take 80 mg by mouth 2 (two) times daily.  . sotalol (BETAPACE) 80 MG tablet    Sig: Take 1 tablet (80 mg total) by mouth 2 (two) times daily.    Dispense:  60 tablet    Refill:  [place] Matisse Salais, MD, [facility] [phone] office [phone] pager

## 2014-09-28 ENCOUNTER — Encounter (HOSPITAL_COMMUNITY): Payer: Self-pay | Admitting: *Deleted

## 2014-09-28 ENCOUNTER — Encounter (HOSPITAL_COMMUNITY)
Admission: RE | Disposition: A | Discharge status: Home / Self Care | Payer: Self-pay | Source: Ambulatory Visit | Attending: Cardiology

## 2014-09-28 ENCOUNTER — Ambulatory Visit (HOSPITAL_COMMUNITY): Payer: Medicare Other | Admitting: Anesthesiology

## 2014-09-28 ENCOUNTER — Ambulatory Visit (HOSPITAL_COMMUNITY)
Admission: RE | Admit: 2014-09-28 | Discharge: 2014-09-28 | Disposition: A | Discharge status: Home / Self Care | Payer: Medicare Other | Source: Ambulatory Visit | Attending: Cardiology | Admitting: Cardiology

## 2014-09-28 ENCOUNTER — Other Ambulatory Visit: Payer: Self-pay | Admitting: Cardiovascular Disease

## 2014-09-28 DIAGNOSIS — I4891 Unspecified atrial fibrillation: Secondary | ICD-10-CM

## 2014-09-28 DIAGNOSIS — E785 Hyperlipidemia, unspecified: Secondary | ICD-10-CM | POA: Insufficient documentation

## 2014-09-28 DIAGNOSIS — E780 Pure hypercholesterolemia: Secondary | ICD-10-CM | POA: Diagnosis not present

## 2014-09-28 DIAGNOSIS — F17210 Nicotine dependence, cigarettes, uncomplicated: Secondary | ICD-10-CM | POA: Insufficient documentation

## 2014-09-28 DIAGNOSIS — Z955 Presence of coronary angioplasty implant and graft: Secondary | ICD-10-CM | POA: Insufficient documentation

## 2014-09-28 DIAGNOSIS — I739 Peripheral vascular disease, unspecified: Secondary | ICD-10-CM | POA: Insufficient documentation

## 2014-09-28 DIAGNOSIS — E119 Type 2 diabetes mellitus without complications: Secondary | ICD-10-CM | POA: Diagnosis not present

## 2014-09-28 DIAGNOSIS — Z87442 Personal history of urinary calculi: Secondary | ICD-10-CM | POA: Diagnosis not present

## 2014-09-28 DIAGNOSIS — Z7982 Long term (current) use of aspirin: Secondary | ICD-10-CM | POA: Insufficient documentation

## 2014-09-28 DIAGNOSIS — I5043 Acute on chronic combined systolic (congestive) and diastolic (congestive) heart failure: Secondary | ICD-10-CM | POA: Insufficient documentation

## 2014-09-28 DIAGNOSIS — K219 Gastro-esophageal reflux disease without esophagitis: Secondary | ICD-10-CM | POA: Insufficient documentation

## 2014-09-28 DIAGNOSIS — I10 Essential (primary) hypertension: Secondary | ICD-10-CM | POA: Insufficient documentation

## 2014-09-28 DIAGNOSIS — Z7901 Long term (current) use of anticoagulants: Secondary | ICD-10-CM | POA: Diagnosis not present

## 2014-09-28 DIAGNOSIS — I255 Ischemic cardiomyopathy: Secondary | ICD-10-CM | POA: Insufficient documentation

## 2014-09-28 DIAGNOSIS — I2510 Atherosclerotic heart disease of native coronary artery without angina pectoris: Secondary | ICD-10-CM | POA: Diagnosis not present

## 2014-09-28 DIAGNOSIS — I481 Persistent atrial fibrillation: Secondary | ICD-10-CM | POA: Diagnosis present

## 2014-09-28 DIAGNOSIS — E0590 Thyrotoxicosis, unspecified without thyrotoxic crisis or storm: Secondary | ICD-10-CM | POA: Insufficient documentation

## 2014-09-28 HISTORY — PX: CARDIOVERSION: SHX1299

## 2014-09-28 LAB — PROTIME-INR
INR: 3.04 — ABNORMAL HIGH (ref 0.00–1.49)
Prothrombin Time: 31.7 seconds — ABNORMAL HIGH (ref 11.6–15.2)

## 2014-09-28 SURGERY — CARDIOVERSION
Anesthesia: Monitor Anesthesia Care

## 2014-09-28 MED ORDER — PROMETHAZINE HCL 25 MG/ML IJ SOLN: 6.2500 mg | INTRAMUSCULAR | Status: DC | PRN

## 2014-09-28 MED ORDER — LIDOCAINE HCL (CARDIAC) 20 MG/ML IV SOLN
INTRAVENOUS | Status: DC | PRN
  Administered 2014-09-28: 30 mg via INTRAVENOUS

## 2014-09-28 MED ORDER — ALBUTEROL SULFATE (2.5 MG/3ML) 0.083% IN NEBU
2.5000 mg | INHALATION_SOLUTION | Freq: Once | RESPIRATORY_TRACT | Status: AC
  Administered 2014-09-28: 2.5 mg via RESPIRATORY_TRACT

## 2014-09-28 MED ORDER — ALBUTEROL SULFATE (2.5 MG/3ML) 0.083% IN NEBU
INHALATION_SOLUTION | RESPIRATORY_TRACT | Status: AC
  Filled 2014-09-28: qty 3

## 2014-09-28 MED ORDER — FENTANYL CITRATE 0.05 MG/ML IJ SOLN
25.0000 ug | INTRAMUSCULAR | Status: DC | PRN
Start: 2014-09-28 — End: 2014-09-28

## 2014-09-28 MED ORDER — SODIUM CHLORIDE 0.9 % IV SOLN
INTRAVENOUS | Status: DC
  Administered 2014-09-28: 500 mL via INTRAVENOUS

## 2014-09-28 MED ORDER — ALBUTEROL SULFATE (2.5 MG/3ML) 0.083% IN NEBU: 2.5000 mg | INHALATION_SOLUTION | Freq: Four times a day (QID) | RESPIRATORY_TRACT | Status: DC | PRN

## 2014-09-28 MED ORDER — SODIUM CHLORIDE 0.9 % IV SOLN
INTRAVENOUS | Status: DC | PRN
  Administered 2014-09-28: 14:00:00 via INTRAVENOUS

## 2014-09-28 MED ORDER — ALBUTEROL SULFATE HFA 108 (90 BASE) MCG/ACT IN AERS: 2.0000 | INHALATION_SPRAY | Freq: Four times a day (QID) | RESPIRATORY_TRACT | Status: DC | PRN

## 2014-09-28 MED ORDER — PROPOFOL 10 MG/ML IV BOLUS
INTRAVENOUS | Status: DC | PRN
  Administered 2014-09-28: 50 mg via INTRAVENOUS

## 2014-09-28 MED ORDER — MEPERIDINE HCL 100 MG/ML IJ SOLN: 6.2500 mg | INTRAMUSCULAR | Status: DC | PRN

## 2014-09-28 NOTE — Discharge Instructions (Signed)
Electrical Cardioversion, Care After °Refer to this sheet in the next few weeks. These instructions provide you with information on caring for yourself after your procedure. Your health care provider may also give you more specific instructions. Your treatment has been planned according to current medical practices, but problems sometimes occur. Call your health care provider if you have any problems or questions after your procedure. °WHAT TO EXPECT AFTER THE PROCEDURE °After your procedure, it is typical to have the following sensations: °· Some redness on the skin where the shocks were delivered. If this is tender, a sunburn lotion or hydrocortisone cream may help. °· Possible return of an abnormal heart rhythm within hours or days after the procedure. °HOME CARE INSTRUCTIONS °· Take medicines only as directed by your health care provider. Be sure you understand how and when to take your medicine. °· Learn how to feel your pulse and check it often. °· Limit your activity for 48 hours after the procedure or as directed by your health care provider. °· Avoid or minimize caffeine and other stimulants as directed by your health care provider. °SEEK MEDICAL CARE IF: °· You feel like your heart is beating too fast or your pulse is not regular. °· You have any questions about your medicines. °· You have bleeding that will not stop. °SEEK IMMEDIATE MEDICAL CARE IF: °· You are dizzy or feel faint. °· It is hard to breathe or you feel short of breath. °· There is a change in discomfort in your chest. °· Your speech is slurred or you have trouble moving an arm or leg on one side of your body. °· You get a serious muscle cramp that does not go away. °· Your fingers or toes turn cold or blue. °Document Released: 05/03/2013 Document Revised: 11/27/2013 Document Reviewed: 05/03/2013 °Exit[employer]® Patient Information ©2015 [employer]. This information is not intended to replace advice given to you by your health care provider.  Make sure you discuss any questions you have with your health care provider. ° ° °Conscious Sedation, Adult, Care After °Refer to this sheet in the next few weeks. These instructions provide you with information on caring for yourself after your procedure. Your health care provider may also give you more specific instructions. Your treatment has been planned according to current medical practices, but problems sometimes occur. Call your health care provider if you have any problems or questions after your procedure. °WHAT TO EXPECT AFTER THE PROCEDURE  °After your procedure: °· You may feel sleepy, clumsy, and have poor balance for several hours. °· Vomiting may occur if you eat too soon after the procedure. °HOME CARE INSTRUCTIONS °· Do not participate in any activities where you could become injured for at least 24 hours. Do not: °¨ Drive. °¨ Swim. °¨ Ride a bicycle. °¨ Operate heavy machinery. °¨ Cook. °¨ Use power tools. °¨ Climb ladders. °¨ Work from a high place. °· Do not make important decisions or sign legal documents until you are improved. °· If you vomit, drink water, juice, or soup when you can drink without vomiting. Make sure you have little or no nausea before eating solid foods. °· Only take over-the-counter or prescription medicines for pain, discomfort, or fever as directed by your health care provider. °· Make sure you and your family fully understand everything about the medicines given to you, including what side effects may occur. °· You should not drink alcohol, take sleeping pills, or take medicines that cause drowsiness for at least 24   hours. °· If you smoke, do not smoke without supervision. °· If you are feeling better, you may resume normal activities 24 hours after you were sedated. °· Keep all appointments with your health care provider. °SEEK MEDICAL CARE IF: °· Your skin is pale or bluish in color. °· You continue to feel nauseous or vomit. °· Your pain is getting worse and is not  helped by medicine. °· You have bleeding or swelling. °· You are still sleepy or feeling clumsy after 24 hours. °SEEK IMMEDIATE MEDICAL CARE IF: °· You develop a rash. °· You have difficulty breathing. °· You develop any type of allergic problem. °· You have a fever. °MAKE SURE YOU: °· Understand these instructions. °· Will watch your condition. °· Will get help right away if you are not doing well or get worse. °Document Released: 05/03/2013 Document Reviewed: 05/03/2013 °ExitCare® Patient Information ©2015 [employer]. This information is not intended to replace advice given to you by your health care provider. Make sure you discuss any questions you have with your health care provider. ° °

## 2014-09-28 NOTE — CV Procedure (Signed)
    Cardioversion Note  SIMRAN MANNIS [id number] 1935/01/06  Procedure: DC Cardioversion Indications: atrial fibrillation  Procedure Details Consent: Obtained Time Out: Verified patient identification, verified procedure, site/side was marked, verified correct patient position, special equipment/implants available, Radiology Safety Procedures followed,  medications/allergies/relevent history reviewed, required imaging and test results available.  Performed  The patient has been on adequate anticoagulation.  The patient received IV lidocan 30 mg and propofol 50 mg for sedation.  Synchronous cardioversion was performed at 120 joules.  The cardioversion was successful   Complications: No apparent complications Patient did tolerate procedure well.   Dorothy Spark, MD, [facility] 09/28/2014, 12:51 PM

## 2014-09-28 NOTE — Interval H&P Note (Signed)
History and Physical Interval Note:  09/28/2014 12:50 PM  Shaun Mclaughlin  has presented today for surgery, with the diagnosis of AFIB  The various methods of treatment have been discussed with the patient and family. After consideration of risks, benefits and other options for treatment, the patient has consented to  Procedure(s): CARDIOVERSION (N/A) as a surgical intervention .  The patient's history has been reviewed, patient examined, no change in status, stable for surgery.  I have reviewed the patient's chart and labs.  Questions were answered to the patient's satisfaction.     Dorothy Spark

## 2014-09-28 NOTE — Anesthesia Postprocedure Evaluation (Signed)
  Anesthesia Post-op Note  Patient: Shaun Mclaughlin  Procedure(s) Performed: Procedure(s): CARDIOVERSION (N/A)  Patient Location: PACU and Endoscopy Unit  Anesthesia Type:General  Level of Consciousness: awake and alert   Airway and Oxygen Therapy: Patient Spontanous Breathing and Patient connected to nasal cannula oxygen  Post-op Pain: mild  Post-op Assessment: Post-op Vital signs reviewed and Patient's Cardiovascular Status Stable  Post-op Vital Signs: Reviewed and stable  Last Vitals:  Filed Vitals:   09/28/14 1407  BP: 183/96  Pulse: 93  Resp: 24    Complications: No apparent anesthesia complications

## 2014-09-28 NOTE — H&P (View-Only) (Signed)
Patient ID: Shaun Mclaughlin, male   DOB: [birth year], 80 y.o.   MRN: [id number]      Reason for office visit Persistent atrial fibrillation, CAD, ischemic cardiomyopathy  Shaun Mclaughlin remains in persistent atrial flutter that is reasonably well rate controlled. Today he has 3:1 conduction with a ventricular rate of 89 bpm. The flutter waves are upright in lead V1 and predominantly a downward deflection in the inferior leads, suggestive of typical counterclockwise atrial flutter.  He continues to complain of fatigue but denies chest pain or dyspnea.  The QT interval after his successful cardioversion in December, while in sinus rhythm, was 455 ms. It is hard to accurately measure his QT interval on today's tracing due to the atrial flutter but the uncorrected QT seems to be roughly 360 ms, correcting to roughly 440 ms.  He had paroxysmal atrial fibrillation for years that was well-controlled with sotalol and warfarin. Now has recurrent persistent atrial fibrillation. He does not have a history of stroke or TIA or other embolic event and has not had bleeding complications. He has coronary artery disease and previously underwent placement of a stent to the right coronary artery in 2003, coronaries patent by cardiac catheterization in 2013. He has hypertension, diabetes mellitus and hyperlipidemia all of which are well compensated usually. He has peripheral arterial disease and received a stent to the distal left subclavian artery in January 2014 for left arm claudication (10 x 40 mm Cordis Smart nitinol). He is also known to have a 50-60% left renal artery stenosis. Echocardiogram and nuclear stress testing have shown reduction in LV systolic function (62% by scintigraphy, 45-50 percent by transthoracic echocardiography, 35% by transesophageal echo at the time of his cardioversion). Nuclear stress testing shows an inferior wall scar without reversible ischemia (Nov 2013). No Known Allergies  Current Outpatient  Prescriptions  Medication Sig Dispense Refill  . Ascorbic Acid (VITAMIN C WITH ROSE HIPS) 1000 MG tablet Take 1,000 mg by mouth daily.    Marland Kitchen aspirin EC 81 MG tablet Take 81 mg by mouth daily.    Marland Kitchen atorvastatin (LIPITOR) 40 MG tablet Take 1 tablet (40 mg total) by mouth daily. 30 tablet 11  . furosemide (LASIX) 40 MG tablet Take 1 tablet (40 mg total) by mouth daily. (Patient taking differently: Take 40 mg by mouth daily as needed for fluid. ) 30 tablet 6  . ketorolac (ACULAR) 0.5 % ophthalmic solution Place 1 drop into the left eye 4 (four) times daily.    Marland Kitchen KRILL OIL PO Take by mouth daily.    Marland Kitchen latanoprost (XALATAN) 0.005 % ophthalmic solution Place 1 drop into both eyes at bedtime.    Marland Kitchen lisinopril (PRINIVIL,ZESTRIL) 20 MG tablet take 1 tablet by mouth once daily 30 tablet 6  . loratadine (CLARITIN) 10 MG tablet Take 10 mg by mouth daily as needed.     . metFORMIN (GLUCOPHAGE-XR) 500 MG 24 hr tablet Take 500 mg by mouth daily with breakfast.    . Multiple Vitamin (MULTIVITAMIN WITH MINERALS) TABS Take 1 tablet by mouth daily.    Marland Kitchen omeprazole (PRILOSEC) 20 MG capsule Take 20 mg by mouth daily.    . potassium chloride SA (K-DUR,KLOR-CON) 20 MEQ tablet Take 1 tablet (20 mEq total) by mouth daily. (Patient taking differently: Take 20 mEq by mouth daily as needed. ) 30 tablet 6  . propylthiouracil (PTU) 50 MG tablet Take 50 mg by mouth daily.    . sotalol (BETAPACE) 80 MG tablet Take 1 tablet (80  mg total) by mouth 2 (two) times daily. 60 tablet 6  . warfarin (COUMADIN) 5 MG tablet Take 1 to 1.5 tablets by mouth daily as directed by coumadin clinic (Patient taking differently: Take 5-7.5 mg by mouth daily. Take 7.5 mg on Monday and Friday and 5 mg all other days) 120 tablet 1   No current facility-administered medications for this visit.    Past Medical History  Diagnosis Date  . Hypercholesteremia   . Pneumonia ~ 2008  . Chronic bronchitis     "about q yr" (08/04/2012)  . Hyperthyroidism   .  Type II diabetes mellitus     "take RX; I'm prediabetic; don't have to  check my CBG qd; keep my weight down" (08/04/2012)  . GERD (gastroesophageal reflux disease)   . External bleeding hemorrhoids ~ 2003; 2008; 2013    "removed polyps and no bleeding since" (08/04/2012)  . Arthritis     "maybe a little bit in some joints" (08/04/2012)  . Kidney stone 1980's  . PVD (peripheral vascular disease) with claudication, Lt Upper ext. pain, and known 80-90%distal Lt. subclavian artery stenosis  08/05/2012  . CAD (coronary artery disease),Hx RCA stenting-patent 06/2012  08/05/2012  . PAF (paroxysmal atrial fibrillation), maintaining SR 08/05/2012  . Chronic anticoagulation, coumadin for PAF 08/05/2012  . DM (diabetes mellitus) 08/05/2012  . Hyperlipidemia 08/05/2012  . S/P angioplasty with stent to Lt. subclavian artery -Nitrinol stent 08/04/12 08/05/2012  . HTN (hypertension) 08/05/2012  . Acute on chronic combined systolic and diastolic congestive heart failure, NYHA class 2 06/29/2014    Past Surgical History  Procedure Laterality Date  . Coronary angioplasty with stent placement  2003    patent 12/13  . Cardiac catheterization  2008 and Dec 2013    patent coronaries  . Tonsillectomy  1940's  . Hernia repair  7322'G    "umbilical" (08/31/4268)  . Inguinal hernia repair  2000's    "right" (08/04/2012)  . Cataract extraction w/ intraocular lens implant      "right eye" (08/04/2012)  . Glaucoma surgery  2000's    "right eye; had laser procedure  to lower the pressure and prevent glaucoma" (08/04/2012)  . Lithotripsy  1980's    "imploded in my kidney; ended up having to be cut open in my back" (08/04/2012)  . Kidney stone surgery  1980's  . Subclavian stent placement  Jan 2014    Lt SCA  . Left heart catheterization with coronary angiogram N/A 06/28/2012    Procedure: LEFT HEART CATHETERIZATION WITH CORONARY ANGIOGRAM;  Surgeon: Lorretta Harp, MD;  Location: [facility] CATH LAB;  Service: Cardiovascular;  Laterality:  N/A;  . Upper extremity angiogram Bilateral 06/28/2012    Procedure: UPPER EXTREMITY ANGIOGRAM;  Surgeon: Lorretta Harp, MD;  Location: [facility] CATH LAB;  Service: Cardiovascular;  Laterality: Bilateral;  . Unilateral upper extremeity angiogram N/A 08/04/2012    Procedure: UNILATERAL UPPER EXTREMEITY ANGIOGRAM;  Surgeon: Lorretta Harp, MD;  Location: [facility] CATH LAB;  Service: Cardiovascular;  Laterality: N/A;  . Cardioversion N/A 07/13/2014    Procedure: CARDIOVERSION;  Surgeon: Sanda Klein, MD;  Location: [facility] ENDOSCOPY;  Service: Cardiovascular;  Laterality: N/A;    Family History  Problem Relation Age of Onset  . Stroke Mother 10  . Coronary artery disease Father 58    History   Social History  . Marital Status: Married    Spouse Name: N/A  . Number of Children: N/A  . Years of Education: N/A   Occupational History  .  Not on file.   Social History Main Topics  . Smoking status: Current Every Day Smoker -- 0.12 packs/day for 25 years    Types: Cigarettes, Pipe  . Smokeless tobacco: Never Used     Comment: 08/04/2012 "still smoke a cigar q once in awhile"  . Alcohol Use: Yes     Comment: 08/04/2012 "once/month I might have a drink (beer, wine, or margarita)  . Drug Use: No  . Sexual Activity: No   Other Topics Concern  . Not on file   Social History Narrative    Review of systems: Lower extremity edema has resolved, no shortness of breath with exertion The patient specifically denies any chest pain at rest or with exertion, dyspnea at rest or with exertion, orthopnea, paroxysmal nocturnal dyspnea, syncope, palpitations, focal neurological deficits, intermittent claudication, lower extremity edema, unexplained weight gain, cough, hemoptysis or wheezing.  The patient also denies abdominal pain, nausea, vomiting, dysphagia, diarrhea, constipation, polyuria, polydipsia, dysuria, hematuria, frequency, urgency, abnormal bleeding or bruising, fever, chills, unexpected weight changes,  mood swings, change in skin or hair texture, change in voice quality, auditory or visual problems, allergic reactions or rashes, new musculoskeletal complaints other than usual "aches and pains".  PHYSICAL EXAM BP 152/60 mmHg  Pulse 89  Resp 16  Ht 5\' 6"  (1.676 m)  Wt 168 lb 12.8 oz (76.567 kg)  BMI 27.26 kg/m2 General: Alert, oriented x3, no distress  Head: no evidence of trauma, PERRL, EOMI, no exophtalmos or lid lag, no myxedema, no xanthelasma; normal ears, nose and oropharynx  Neck: normal jugular venous pulsations and no hepatojugular reflux; brisk carotid pulses without delay and no carotid bruits  Chest: clear to auscultation, no signs of consolidation by percussion or palpation, normal fremitus, symmetrical and full respiratory excursions  Cardiovascular: normal position and quality of the apical impulse, regular rhythm with ectopy and a pattern of quadrigeminy, normal first and second heart sounds, no murmurs, rubs or gallops  Abdomen: no tenderness or distention, no masses by palpation, no abnormal pulsatility or arterial bruits, normal bowel sounds, no hepatosplenomegaly  Extremities: no clubbing, cyanosis; symmetrical 2+ pitting calf/pretibial edema; 2+ radial, ulnar and brachial pulses bilaterally; 2+ right femoral, posterior tibial and dorsalis pedis pulses; 2+ left femoral, posterior tibial and dorsalis pedis pulses; no subclavian or femoral bruits  Neurological: grossly nonfocal   EKG: Atrial flutter, probably typical counterclockwise with controlled ventricular rate  Lipid Panel  No results found for: CHOL, TRIG, HDL, CHOLHDL, VLDL, LDLCALC, LDLDIRECT  BMET    Component Value Date/Time   NA 140 07/13/2014 1051   K 4.6 07/13/2014 1051   CL 100 07/13/2014 1051   CO2 29 07/13/2014 1051   GLUCOSE 120* 07/13/2014 1051   BUN 14 07/13/2014 1051   CREATININE 0.79 07/13/2014 1051   CREATININE 0.98 07/09/2014 0826   CALCIUM 9.3 07/13/2014 1051   GFRNONAA 84*  07/13/2014 1051   GFRAA >90 07/13/2014 1051     ASSESSMENT AND PLAN Persistent atrial fibrillation Discuss further options. I recommended hospitalization to initiate treatment with dofetilide. He wants to avoid this. His QT interval in sinus rhythm permits increasing the sotalol to 80 mg twice a day. We'll schedule him for another attempt at cardioversion after one week on the increased dose. This may cause worsened sinus bradycardia. May eventually need a dual-chamber pacemaker if he develops full-blown tachycardia-bradycardia syndrome and is symptomatic. Continue anticoagulants. He works 4 days a week except for Fridays.  This procedure has been fully reviewed with the  patient and informed consent has been obtained.  PVD - s/p LSCA stent 08/04/12  Asymptomatic. Equal blood pressures in left and right upper extremity   CAD,Hx RCA stent Jan '03. patent at cath 06/2012  Newly reduced left ventricular systolic function compared to previous assessments, but the pattern of hypokinesis is diffuse and he has not had angina. Might be related to atrial arrhythmia? Most recent cath showed widely patent stent. Focus on long-term coronary risk factor modification.  HTN (hypertension)  Well-controlled  Lower extremity edema - combined systolic and diastolic heart failure, acute on chronic Prompt improvement following diuretics is consistent with congestive heart failure. We'll need to reevaluate left ventricular systolic function once we have had successful maintenance of sinus rhythm for longer period of time. If EF remains low, may need to repeat coronary workup  Tobacco use He only smokes 5 cigarettes a day, but I encouraged him to quit completely and permanently  Hyperlipidemia excellent lipid parameters except low HDL cholesterol Orders Placed This Encounter  Procedures  . Cardioversion (Bedside)  . APTT  . CBC  . Comprehensive metabolic panel  . Protime-INR  . EKG 12-Lead   Meds  ordered this encounter  Medications  . DISCONTD: sotalol (BETAPACE) 80 MG tablet    Sig: Take 80 mg by mouth 2 (two) times daily.  . sotalol (BETAPACE) 80 MG tablet    Sig: Take 1 tablet (80 mg total) by mouth 2 (two) times daily.    Dispense:  60 tablet    Refill:  [place] Chaka Jefferys, MD, [facility] [phone] office [phone] pager

## 2014-09-28 NOTE — Transfer of Care (Signed)
Immediate Anesthesia Transfer of Care Note  Patient: Shaun Mclaughlin  Procedure(s) Performed: Procedure(s): CARDIOVERSION (N/A)  Patient Location: Endoscopy Unit  Anesthesia Type:MAC  Level of Consciousness: awake, alert  and oriented  Airway & Oxygen Therapy: Patient Spontanous Breathing and Patient connected to nasal cannula oxygen  Post-op Assessment: Report given to RN, Post -op Vital signs reviewed and stable and Patient moving all extremities  Post vital signs: Reviewed and stable  Last Vitals:  Filed Vitals:   09/28/14 1407  BP: 183/96  Pulse: 93  Resp: 24    Complications: No apparent anesthesia complications

## 2014-09-28 NOTE — Anesthesia Preprocedure Evaluation (Addendum)
Anesthesia Evaluation   Patient awake    Reviewed: Allergy & Precautions, NPO status , Patient's Chart, lab work & pertinent test results  Airway Mallampati: II   Neck ROM: Full    Dental  (+) Teeth Intact, Dental Advisory Given   Pulmonary Current Smoker,  breath sounds clear to auscultation        Cardiovascular hypertension, Pt. on medications + Peripheral Vascular Disease Rhythm:Irregular  [facility]  EF 30-35%   Neuro/Psych Carotids mild stenosis 08/2013    GI/Hepatic GERD-  Medicated,  Endo/Other  diabetes  Renal/GU      Musculoskeletal   Abdominal (+)  Abdomen: soft.    Peds  Hematology   Anesthesia Other Findings   Reproductive/Obstetrics                            Anesthesia Physical Anesthesia Plan  ASA: III  Anesthesia Plan: MAC   Post-op Pain Management:    Induction: Intravenous  Airway Management Planned: Nasal Cannula  Additional Equipment:   Intra-op Plan:   Post-operative Plan:   Informed Consent: I have reviewed the patients History and Physical, chart, labs and discussed the procedure including the risks, benefits and alternatives for the proposed anesthesia with the patient or authorized representative who has indicated his/her understanding and acceptance.     Plan Discussed with:   Anesthesia Plan Comments:         Anesthesia Quick Evaluation

## 2014-10-01 ENCOUNTER — Encounter (HOSPITAL_COMMUNITY): Payer: Self-pay | Admitting: Cardiology

## 2014-11-02 ENCOUNTER — Ambulatory Visit (INDEPENDENT_AMBULATORY_CARE_PROVIDER_SITE_OTHER): Payer: Medicare Other | Admitting: Pharmacist Clinician (PhC)/ Clinical Pharmacy Specialist

## 2014-11-02 DIAGNOSIS — I4819 Other persistent atrial fibrillation: Secondary | ICD-10-CM

## 2014-11-02 DIAGNOSIS — I480 Paroxysmal atrial fibrillation: Secondary | ICD-10-CM

## 2014-11-02 DIAGNOSIS — I481 Persistent atrial fibrillation: Secondary | ICD-10-CM | POA: Diagnosis not present

## 2014-11-02 DIAGNOSIS — Z7901 Long term (current) use of anticoagulants: Secondary | ICD-10-CM | POA: Diagnosis not present

## 2014-11-02 LAB — POCT INR: INR: 2

## 2014-11-05 ENCOUNTER — Other Ambulatory Visit: Payer: Self-pay | Admitting: *Deleted

## 2014-11-05 MED ORDER — LISINOPRIL 20 MG PO TABS: 20.0000 mg | ORAL_TABLET | Freq: Every day | ORAL | Status: DC

## 2014-12-14 ENCOUNTER — Encounter: Payer: Self-pay | Admitting: Cardiovascular Disease

## 2014-12-14 ENCOUNTER — Ambulatory Visit (INDEPENDENT_AMBULATORY_CARE_PROVIDER_SITE_OTHER): Payer: Medicare Other | Admitting: Pharmacist Clinician (PhC)/ Clinical Pharmacy Specialist

## 2014-12-14 ENCOUNTER — Ambulatory Visit (INDEPENDENT_AMBULATORY_CARE_PROVIDER_SITE_OTHER): Payer: Medicare Other | Admitting: Cardiovascular Disease

## 2014-12-14 VITALS — BP 207/108 | HR 82 | Ht 66.0 in | Wt 169.7 lb

## 2014-12-14 DIAGNOSIS — I480 Paroxysmal atrial fibrillation: Secondary | ICD-10-CM | POA: Diagnosis not present

## 2014-12-14 DIAGNOSIS — I5042 Chronic combined systolic (congestive) and diastolic (congestive) heart failure: Secondary | ICD-10-CM

## 2014-12-14 DIAGNOSIS — I701 Atherosclerosis of renal artery: Secondary | ICD-10-CM | POA: Diagnosis not present

## 2014-12-14 DIAGNOSIS — I4892 Unspecified atrial flutter: Secondary | ICD-10-CM | POA: Diagnosis not present

## 2014-12-14 DIAGNOSIS — I481 Persistent atrial fibrillation: Secondary | ICD-10-CM

## 2014-12-14 DIAGNOSIS — Z7901 Long term (current) use of anticoagulants: Secondary | ICD-10-CM

## 2014-12-14 DIAGNOSIS — I739 Peripheral vascular disease, unspecified: Secondary | ICD-10-CM

## 2014-12-14 DIAGNOSIS — I150 Renovascular hypertension: Secondary | ICD-10-CM

## 2014-12-14 DIAGNOSIS — I4819 Other persistent atrial fibrillation: Secondary | ICD-10-CM

## 2014-12-14 DIAGNOSIS — I2583 Coronary atherosclerosis due to lipid rich plaque: Secondary | ICD-10-CM

## 2014-12-14 DIAGNOSIS — I2510 Atherosclerotic heart disease of native coronary artery without angina pectoris: Secondary | ICD-10-CM

## 2014-12-14 LAB — POCT INR: INR: 2.1

## 2014-12-14 MED ORDER — LISINOPRIL 40 MG PO TABS: 40.0000 mg | ORAL_TABLET | Freq: Every day | ORAL | Status: DC

## 2014-12-14 NOTE — Patient Instructions (Signed)
INCREASE Lisinopril to 40mg  daily.  Your physician has requested that you have a renal artery duplex. During this test, an ultrasound is used to evaluate blood flow to the kidneys. Allow one hour for this exam. Do not eat after midnight the day before and avoid carbonated beverages. Take your medications as you usually do.  Your physician recommends that you schedule a follow-up appointment in: 4 weeks with Erasmo Downer, PharmD for BP check and INR.

## 2014-12-14 NOTE — Progress Notes (Signed)
Patient ID: Shaun Mclaughlin, male   DOB: Aug 26, 1934, 80 y.o.   MRN: [id number]      Cardiology Office Note   Date:  12/14/2014   ID:  Shaun Mclaughlin, DOB [birth year], MRN [id number]  PCP:  Gara Kroner, MD  Cardiologist:   Sanda Klein, MD   Chief Complaint  Patient presents with  . Follow-up    No complaints of chest pain, SOB, edema or dizziness.      History of Present Illness: Shaun Mclaughlin is a 80 y.o. male who presents for Persistent atrial flutter/ previous atrial fibrillation, CAD, ischemic cardiomyopathy  Shaun Mclaughlin had a second attempt at restoration of sinus rhythm in March with a cardioversion performed after an increase in his dose of sotalol. He believes he remained in normal rhythm for about a week. Shaun Mclaughlin remains in persistent atrial flutter that is reasonably well rate controlled. Today he has 3:1 conduction with a ventricular rate of 82 bpm. The flutter waves are upright in lead V1 and predominantly a downward deflection in the inferior leads, suggestive of typical counterclockwise atrial flutter. However, he has had clear atrial fibrillation the past.  Hehe seems to be less bothered by fatigue and denies chest pain or dyspnea. He has not had syncope or dizziness.  The QT interval after his successful cardioversion in December, while in sinus rhythm, was 455 ms. It is hard to accurately measure his QT interval on today's tracing due to the atrial flutter but the uncorrected QT seems to be roughly 410 ms, correcting to roughly 480 ms. He is consistently therapeutically anticoagulated.  His blood pressure is quite high today which is unusual for him. His blood pressure has been well controlled for years. He is very compliant with his medications. Recently checked renal function (February 26) showed creatinine of 0.86 and BUN of 15, potassium 4.4.  He had paroxysmal atrial fibrillation for years that was well-controlled with sotalol and warfarin. Now has recurrent persistent  atrial fibrillation. He does not have a history of stroke or TIA or other embolic event and has not had bleeding complications. He has coronary artery disease and previously underwent placement of a stent to the right coronary artery in 2003, coronaries patent by cardiac catheterization in 2013. He has hypertension, diabetes mellitus and hyperlipidemia all of which are well compensated usually. He has peripheral arterial disease and received a stent to the distal left subclavian artery in January 2014 for left arm claudication (10 x 40 mm Cordis Smart nitinol). He is also known to have a 50-60% left renal artery stenosis. Echocardiogram and nuclear stress testing have shown reduction in LV systolic function (03% by scintigraphy, 45-50 percent by transthoracic echocardiography, 35% by transesophageal echo at the time of his cardioversion). Nuclear stress testing shows an inferior wall scar without reversible ischemia (Nov 2013).  No Known Allergies    Past Medical History  Diagnosis Date  . Hypercholesteremia   . Pneumonia ~ 2008  . Chronic bronchitis     "about q yr" (08/04/2012)  . Hyperthyroidism   . Type II diabetes mellitus     "take RX; I'm prediabetic; don't have to  check my CBG qd; keep my weight down" (08/04/2012)  . GERD (gastroesophageal reflux disease)   . External bleeding hemorrhoids ~ 2003; 2008; 2013    "removed polyps and no bleeding since" (08/04/2012)  . Arthritis     "maybe a little bit in some joints" (08/04/2012)  . Kidney stone 1980's  . PVD (peripheral vascular disease)  with claudication, Lt Upper ext. pain, and known 80-90%distal Lt. subclavian artery stenosis  08/05/2012  . CAD (coronary artery disease),Hx RCA stenting-patent 06/2012  08/05/2012  . PAF (paroxysmal atrial fibrillation), maintaining SR 08/05/2012  . Chronic anticoagulation, coumadin for PAF 08/05/2012  . DM (diabetes mellitus) 08/05/2012  . Hyperlipidemia 08/05/2012  . S/P angioplasty with stent to Lt.  subclavian artery -Nitrinol stent 08/04/12 08/05/2012  . HTN (hypertension) 08/05/2012  . Acute on chronic combined systolic and diastolic congestive heart failure, NYHA class 2 06/29/2014    Past Surgical History  Procedure Laterality Date  . Coronary angioplasty with stent placement  2003    patent 12/13  . Cardiac catheterization  2008 and Dec 2013    patent coronaries  . Tonsillectomy  1940's  . Hernia repair  0539'J    "umbilical" (12/31/3417)  . Inguinal hernia repair  2000's    "right" (08/04/2012)  . Cataract extraction w/ intraocular lens implant      "right eye" (08/04/2012)  . Glaucoma surgery  2000's    "right eye; had laser procedure  to lower the pressure and prevent glaucoma" (08/04/2012)  . Lithotripsy  1980's    "imploded in my kidney; ended up having to be cut open in my back" (08/04/2012)  . Kidney stone surgery  1980's  . Subclavian stent placement  Jan 2014    Lt SCA  . Left heart catheterization with coronary angiogram N/A 06/28/2012    Procedure: LEFT HEART CATHETERIZATION WITH CORONARY ANGIOGRAM;  Surgeon: Lorretta Harp, MD;  Location: [facility] CATH LAB;  Service: Cardiovascular;  Laterality: N/A;  . Upper extremity angiogram Bilateral 06/28/2012    Procedure: UPPER EXTREMITY ANGIOGRAM;  Surgeon: Lorretta Harp, MD;  Location: [facility] CATH LAB;  Service: Cardiovascular;  Laterality: Bilateral;  . Unilateral upper extremeity angiogram N/A 08/04/2012    Procedure: UNILATERAL UPPER EXTREMEITY ANGIOGRAM;  Surgeon: Lorretta Harp, MD;  Location: [facility] CATH LAB;  Service: Cardiovascular;  Laterality: N/A;  . Cardioversion N/A 07/13/2014    Procedure: CARDIOVERSION;  Surgeon: Sanda Klein, MD;  Location: [facility] ENDOSCOPY;  Service: Cardiovascular;  Laterality: N/A;  . Cardioversion N/A 09/28/2014    Procedure: CARDIOVERSION;  Surgeon: Dorothy Spark, MD;  Location: [facility] ENDOSCOPY;  Service: Cardiovascular;  Laterality: N/A;     Current Outpatient Prescriptions  Medication Sig Dispense  Refill  . albuterol (PROVENTIL HFA;VENTOLIN HFA) 108 (90 BASE) MCG/ACT inhaler Inhale 2 puffs into the lungs every 6 (six) hours as needed for wheezing or shortness of breath. 1 Inhaler 5  . Ascorbic Acid (VITAMIN C WITH ROSE HIPS) 1000 MG tablet Take 1,000 mg by mouth daily.    Marland Kitchen aspirin EC 81 MG tablet Take 81 mg by mouth daily.    Marland Kitchen atorvastatin (LIPITOR) 40 MG tablet Take 1 tablet (40 mg total) by mouth daily. 30 tablet 11  . furosemide (LASIX) 40 MG tablet Take 1 tablet (40 mg total) by mouth daily. (Patient taking differently: Take 40 mg by mouth daily as needed for fluid. ) 30 tablet 6  . KRILL OIL PO Take by mouth daily.    Marland Kitchen latanoprost (XALATAN) 0.005 % ophthalmic solution Place 1 drop into both eyes at bedtime.    Marland Kitchen lisinopril (PRINIVIL,ZESTRIL) 20 MG tablet Take 1 tablet (20 mg total) by mouth daily. 30 tablet 6  . loratadine (CLARITIN) 10 MG tablet Take 10 mg by mouth daily as needed.     . metFORMIN (GLUCOPHAGE-XR) 500 MG 24 hr tablet Take 500 mg by  mouth daily with breakfast.    . Multiple Vitamin (MULTIVITAMIN WITH MINERALS) TABS Take 1 tablet by mouth daily.    Marland Kitchen omeprazole (PRILOSEC) 20 MG capsule Take 20 mg by mouth daily.    . potassium chloride SA (K-DUR,KLOR-CON) 20 MEQ tablet Take 1 tablet (20 mEq total) by mouth daily. (Patient taking differently: Take 20 mEq by mouth daily as needed. ) 30 tablet 6  . propylthiouracil (PTU) 50 MG tablet Take 50 mg by mouth daily.    . sotalol (BETAPACE) 160 MG tablet Take 320 mg by mouth daily.    Marland Kitchen warfarin (COUMADIN) 5 MG tablet Take 1 to 1.5 tablets by mouth daily as directed by coumadin clinic (Patient taking differently: Take 5-7.5 mg by mouth daily. Take 7.5 mg on Monday and Friday and 5 mg all other days) 120 tablet 1   No current facility-administered medications for this visit.    Allergies:   Review of patient's allergies indicates no known allergies.    Social History:  The patient  reports that he has been smoking  Cigarettes and Pipe.  He has a 3 pack-year smoking history. He has never used smokeless tobacco. He reports that he drinks alcohol. He reports that he does not use illicit drugs.   Family History:  The patient's family history includes Coronary artery disease (age of onset: 47) in his father; Stroke (age of onset: 37) in his mother.    ROS:  Please see the history of present illness.    Otherwise, review of systems positive for none.   All other systems are reviewed and negative.    PHYSICAL EXAM: VS:  BP 207/108 mmHg  Pulse 82  Ht 5\' 6"  (1.676 m)  Wt 169 lb 11.2 oz (76.975 kg)  BMI 27.40 kg/m2 , BMI Body mass index is 27.4 kg/(m^2).  General: Alert, oriented x3, no distress Head: no evidence of trauma, PERRL, EOMI, no exophtalmos or lid lag, no myxedema, no xanthelasma; normal ears, nose and oropharynx Neck: normal jugular venous pulsations and no hepatojugular reflux; brisk carotid pulses without delay and no carotid bruits Chest: clear to auscultation but with diffusely reduced breath sounds, no signs of consolidation by percussion or palpation, normal fremitus, symmetrical and full respiratory excursions Cardiovascular: normal position and quality of the apical impulse, regular rhythm with occasional irregularity, normal first and second heart sounds, no murmurs, rubs or gallops Abdomen: no tenderness or distention, no masses by palpation, no abnormal pulsatility or arterial bruits, normal bowel sounds, no hepatosplenomegaly Extremities: no clubbing, cyanosis or edema; 2+ radial, ulnar and brachial pulses bilaterally; 2+ right femoral, posterior tibial and dorsalis pedis pulses; 2+ left femoral, posterior tibial and dorsalis pedis pulses; no subclavian or femoral bruits Neurological: grossly nonfocal Psych: euthymic mood, full affect   EKG:  EKG is ordered today. The ekg ordered today demonstrates probably typical counterclockwise atrial flutter with predominantly 321 AV block and a  couple of wide complex beats representing aberrancy/fusion beats, corrected QT interval 480 ms Recent Labs: 07/09/2014: TSH 0.702 09/21/2014: ALT 15; BUN 15; Creatinine 0.86; Hemoglobin 14.8; Platelets 166; Potassium 4.4; Sodium 138    Lipid Panel No results found for: CHOL, TRIG, HDL, CHOLHDL, VLDL, LDLCALC, LDLDIRECT    Wt Readings from Last 3 Encounters:  12/14/14 169 lb 11.2 oz (76.975 kg)  09/21/14 168 lb 12.8 oz (76.567 kg)  08/14/14 170 lb 4.8 oz (77.248 kg)     ASSESSMENT AND PLAN:  Recurrent persistent atrial flutter (probably typical counterclockwise) following 2 separate  cardioversion this year on what is likely now a maximum dose of sotalol. He has a previous history of atrial fibrillation. He seems to be tolerating the arrhythmia better. She denies any change in his stamina are dyspnea and is still working 4 days a week at the age of 16. Offered again hospitalization to initiate dofetilide but he does not want to do this. Similarly, I'm wary of the potential side effects of amiodarone. Amiodarone would very likely push him to need a pacemaker, based on the sinus bradycardia that was evident after his cardioversion. After some discussion we decided to continue the current course of action. I told him that if he remains in persistent atrial flutter or fibrillation at his next appointment with plan to discontinue sotalol and replace it with a safer rate control medication. I don't think we can increase the dose of sotalol due to the degree of QT prolongation, also due to the fact that this nonselective beta blocker may worsen his chronic obstructive lung disease.  PVD - s/p LSCA stent 08/04/12  Asymptomatic. Equal blood pressures in left and right upper extremity   CAD,Hx RCA stent Jan '03. patent at cath 06/2012  Newly reduced left ventricular systolic function compared to previous assessments, but the pattern of hypokinesis is diffuse and he has not had angina. Might be related to  atrial arrhythmia? Most recent cath showed widely patent stent. Focus on long-term coronary risk factor modification.  Left ventricular systolic dysfunction Recheck EF in another couple of months  HTN (hypertension)  Well-controlled until now. Fairly severe hypertension today. I wonder whether he could've developed renal artery stenosis  Lower extremity edema - combined systolic and diastolic heart failure, acute on chronic Prompt improvement following diuretics is consistent with congestive heart failure. We'll need to reevaluate left ventricular systolic function once we have had successful maintenance of sinus rhythm for longer period of time. If EF remains low, may need to repeat coronary workup  Tobacco use He only smokes 5 cigarettes a day, but I encouraged him to quit completely and permanently  Hyperlipidemia On statin, high dose.  Current medicines are reviewed at length with the patient today.  The patient does not have concerns regarding medicines.  The following changes have been made:  Increase lisinopril to 40 mg daily  Labs/ tests ordered today include:  Orders Placed This Encounter  Procedures  . EKG 12-Lead   Patient Instructions  INCREASE Lisinopril to 40mg  daily.  Your physician has requested that you have a renal artery duplex. During this test, an ultrasound is used to evaluate blood flow to the kidneys. Allow one hour for this exam. Do not eat after midnight the day before and avoid carbonated beverages. Take your medications as you usually do.  Your physician recommends that you schedule a follow-up appointment in: 4 weeks with Erasmo Downer, PharmD for BP check and INR.     Mikael Spray, MD  12/14/2014 8:32 AM    Sanda Klein, MD, [facility] [phone] office [phone] pager

## 2014-12-30 ENCOUNTER — Other Ambulatory Visit: Payer: Self-pay | Admitting: Cardiovascular Disease

## 2015-01-08 ENCOUNTER — Ambulatory Visit (INDEPENDENT_AMBULATORY_CARE_PROVIDER_SITE_OTHER): Payer: Medicare Other | Admitting: Pharmacist Clinician (PhC)/ Clinical Pharmacy Specialist

## 2015-01-08 ENCOUNTER — Encounter: Payer: Self-pay | Admitting: Pharmacist Clinician (PhC)/ Clinical Pharmacy Specialist

## 2015-01-08 VITALS — BP 132/58 | HR 56 | Ht 66.0 in | Wt 165.7 lb

## 2015-01-08 DIAGNOSIS — I480 Paroxysmal atrial fibrillation: Secondary | ICD-10-CM | POA: Diagnosis not present

## 2015-01-08 DIAGNOSIS — Z7901 Long term (current) use of anticoagulants: Secondary | ICD-10-CM | POA: Diagnosis not present

## 2015-01-08 DIAGNOSIS — I481 Persistent atrial fibrillation: Secondary | ICD-10-CM

## 2015-01-08 DIAGNOSIS — I150 Renovascular hypertension: Secondary | ICD-10-CM | POA: Diagnosis not present

## 2015-01-08 DIAGNOSIS — I4819 Other persistent atrial fibrillation: Secondary | ICD-10-CM

## 2015-01-08 LAB — POCT INR: INR: 2

## 2015-01-08 NOTE — Progress Notes (Signed)
01/08/2015 JESSICA SEIDMAN [birth year] [id number]   HPI:  MCLAIN FREER is a 80 y.o. male patient of Dr Sallyanne Kuster, with a PMH below who presents today for hypertension clinic evaluation.  Cardiac Hx: persistent AF, ischemic cardiomyopathy, PCI to RCA (2003), PCI to distal L subclavian (2013), 50-60% RAS, diabetes  Family Hx: father deceased cerebral hemorrhage at age80, mother MI at 11, no siblings  Social Hx: smokes 3-4 cigarettes/day, occasional alcohol.  Drinks some caffeine (coffee 1/2 decaf, tea)  Diet: eats lunch out most days, along with occasional Friday/Saturday dinner.  Does not add salt to meals  Exercise: uses treadmill during winter months, summer is usually outside doing yard maintenance  Home BP readings: mostly 465-681E systolic, only as high as 160 1-2 times; diastolic readings WNL  Current antihypertensive medications: lisinopril 40 mg daily   Current Outpatient Prescriptions  Medication Sig Dispense Refill  . albuterol (PROVENTIL HFA;VENTOLIN HFA) 108 (90 BASE) MCG/ACT inhaler Inhale 2 puffs into the lungs every 6 (six) hours as needed for wheezing or shortness of breath. 1 Inhaler 5  . Ascorbic Acid (VITAMIN C WITH ROSE HIPS) 1000 MG tablet Take 1,000 mg by mouth daily.    Marland Kitchen aspirin EC 81 MG tablet Take 81 mg by mouth daily.    Marland Kitchen atorvastatin (LIPITOR) 40 MG tablet Take 1 tablet (40 mg total) by mouth daily. 30 tablet 11  . furosemide (LASIX) 40 MG tablet Take 1 tablet (40 mg total) by mouth daily. (Patient taking differently: Take 40 mg by mouth daily as needed for fluid. ) 30 tablet 6  . KRILL OIL PO Take by mouth daily.    Marland Kitchen latanoprost (XALATAN) 0.005 % ophthalmic solution Place 1 drop into both eyes at bedtime.    Marland Kitchen lisinopril (PRINIVIL,ZESTRIL) 40 MG tablet Take 1 tablet (40 mg total) by mouth daily. 30 tablet 6  . loratadine (CLARITIN) 10 MG tablet Take 10 mg by mouth daily as needed.     . metFORMIN (GLUCOPHAGE-XR) 500 MG 24 hr tablet Take 500 mg by  mouth daily with breakfast.    . Multiple Vitamin (MULTIVITAMIN WITH MINERALS) TABS Take 1 tablet by mouth daily.    Marland Kitchen omeprazole (PRILOSEC) 20 MG capsule Take 20 mg by mouth daily.    . potassium chloride SA (K-DUR,KLOR-CON) 20 MEQ tablet Take 1 tablet (20 mEq total) by mouth daily. (Patient taking differently: Take 20 mEq by mouth daily as needed. ) 30 tablet 6  . propylthiouracil (PTU) 50 MG tablet Take 50 mg by mouth daily.    . sotalol (BETAPACE) 160 MG tablet Take 320 mg by mouth daily.    Marland Kitchen warfarin (COUMADIN) 5 MG tablet TAKE 1 TO 1 AND 1/2 TABLETS DAILY AS DIRECTED BY COUMADIN CLINIC 120 tablet 1   No current facility-administered medications for this visit.    No Known Allergies  Past Medical History  Diagnosis Date  . Hypercholesteremia   . Pneumonia ~ 2008  . Chronic bronchitis     "about q yr" (08/04/2012)  . Hyperthyroidism   . Type II diabetes mellitus     "take RX; I'm prediabetic; don't have to  check my CBG qd; keep my weight down" (08/04/2012)  . GERD (gastroesophageal reflux disease)   . External bleeding hemorrhoids ~ 2003; 2008; 2013    "removed polyps and no bleeding since" (08/04/2012)  . Arthritis     "maybe a little bit in some joints" (08/04/2012)  . Kidney stone 1980's  . PVD (  peripheral vascular disease) with claudication, Lt Upper ext. pain, and known 80-90%distal Lt. subclavian artery stenosis  08/05/2012  . CAD (coronary artery disease),Hx RCA stenting-patent 06/2012  08/05/2012  . PAF (paroxysmal atrial fibrillation), maintaining SR 08/05/2012  . Chronic anticoagulation, coumadin for PAF 08/05/2012  . DM (diabetes mellitus) 08/05/2012  . Hyperlipidemia 08/05/2012  . S/P angioplasty with stent to Lt. subclavian artery -Nitrinol stent 08/04/12 08/05/2012  . HTN (hypertension) 08/05/2012  . Acute on chronic combined systolic and diastolic congestive heart failure, NYHA class 2 06/29/2014    Blood pressure 132/58, pulse 56, height 5\' 6"  (1.676 m), weight 165 lb 11.2  oz (75.161 kg).    Tommy Medal PharmD CPP [facility]

## 2015-01-08 NOTE — Assessment & Plan Note (Addendum)
Pt saw Dr. Sallyanne Kuster last month with BP of 207/108.  Pt reports that it was lower before he left the office, but still elevated.  Lisinopril was increased to 40 mg daily.  Since then most of his home readings have been in the high 767W systolic and he reports nothing as high as 160 since the week after his MD appointment.   Today his BP is normal at 132/56.  He is scheduled for a repeat renal doppler next week.  For now he will keep that appointment, and continue to monitor home BP 3-4 times each week.  I have asked that he bring his BP cuff back to his next coumadin check, so that we can verify its accuracy.  Will continue to watch his BP at the next several coumadin checks, to be sure he maintains normal BP.

## 2015-01-08 NOTE — Patient Instructions (Addendum)
Return for a a follow up appointment in 6 weeks  Your blood pressure today is 132/56  (goal is < 140/90)  Check your blood pressure at home 3-4 times each week  and keep record of the readings.  Take your BP meds as follows: continue with lisinopril 40 mg daily  Bring all of your meds, your BP cuff and your record of home blood pressures to your next appointment.  Exercise as you're able, try to walk approximately 30 minutes per day.  Keep salt intake to a minimum, especially watch canned and prepared boxed foods.  Eat more fresh fruits and vegetables and fewer canned items.  Avoid eating in fast food restaurants.    HOW TO TAKE YOUR BLOOD PRESSURE: . Rest 5 minutes before taking your blood pressure. .  Don't smoke or drink caffeinated beverages for at least 30 minutes before. . Take your blood pressure before (not after) you eat. . Sit comfortably with your back supported and both feet on the floor (don't cross your legs). . Elevate your arm to heart level on a table or a desk. . Use the proper sized cuff. It should fit smoothly and snugly around your bare upper arm. There should be enough room to slip a fingertip under the cuff. The bottom edge of the cuff should be 1 inch above the crease of the elbow. . Ideally, take 3 measurements at one sitting and record the average.

## 2015-01-18 ENCOUNTER — Ambulatory Visit (HOSPITAL_COMMUNITY)
Admission: RE | Admit: 2015-01-18 | Discharge: 2015-01-18 | Disposition: A | Discharge status: Home / Self Care | Payer: Medicare Other | Source: Ambulatory Visit | Attending: Cardiovascular Disease | Admitting: Cardiovascular Disease

## 2015-01-18 DIAGNOSIS — I701 Atherosclerosis of renal artery: Secondary | ICD-10-CM | POA: Diagnosis not present

## 2015-01-22 ENCOUNTER — Telehealth: Payer: Self-pay | Admitting: *Deleted

## 2015-01-22 DIAGNOSIS — I701 Atherosclerosis of renal artery: Secondary | ICD-10-CM

## 2015-01-22 NOTE — Progress Notes (Signed)
I would be happy to see.  

## 2015-01-22 NOTE — Telephone Encounter (Signed)
Renal artery doppler results called to patient.  Agreed with plan to have him see Dr. Gwenlyn Found in consultation to assess whether he needs stenting.  Patient voiced understanding.

## 2015-01-22 NOTE — Telephone Encounter (Signed)
-----   Message from Sanda Klein, MD sent at 01/22/2015 12:31 PM EDT ----- There is significant left renal artery stenosis, which could explain recent increase in BP. BP now better, but I would suggest consultation with Dr. Gwenlyn Found to see if he advises stenting

## 2015-01-25 ENCOUNTER — Ambulatory Visit: Payer: Medicare Other | Admitting: Pharmacist Clinician (PhC)/ Clinical Pharmacy Specialist

## 2015-01-29 ENCOUNTER — Ambulatory Visit: Payer: Medicare Other | Admitting: Cardiovascular Disease

## 2015-02-01 ENCOUNTER — Encounter: Payer: Self-pay | Admitting: Cardiovascular Disease

## 2015-02-01 ENCOUNTER — Ambulatory Visit (INDEPENDENT_AMBULATORY_CARE_PROVIDER_SITE_OTHER): Payer: Medicare Other | Admitting: Cardiovascular Disease

## 2015-02-01 VITALS — BP 151/88 | HR 75 | Ht 66.0 in | Wt 167.5 lb

## 2015-02-01 DIAGNOSIS — I701 Atherosclerosis of renal artery: Secondary | ICD-10-CM | POA: Diagnosis not present

## 2015-02-01 NOTE — Patient Instructions (Signed)
Dr Berry recommends that you follow-up with him as needed. 

## 2015-02-01 NOTE — Progress Notes (Signed)
02/01/2015 Shaun Mclaughlin   18-Dec-1934  [id number]  Primary Physician Gara Kroner, MD Primary Cardiologist: Lorretta Harp MD Renae Gloss   HPI:  Shaun Mclaughlin is a 80 year old married Caucasian male father of 3 living children who has a history of CAD status post RCA stenting in the past. His problems include paroxysmal atrial fibrillation on Coumadin anticoagulation and sotalol. He has hypertension and dyslipidemia as well. He had been complaining of left upper extremity discomfort Dopplers in our office suggested in subclavian region along with an upper extremity blood pressure gradient. I cathed him on 06/28/2012 revealing a widely patent RCA stent with no other significant CAD. He had an 80-90% distal left subclavian artery stenosis beyond the LIMA and vertebral takeoff. This was an unusual location for an atherosclerotic plaque. I brought him back 08/04/12 and stented his left subclavian artery with a nitinol self expanding stent. He has had several DC cardioversions in the recent past not able to hold sinus rhythm. He was referred back to me for renal artery stenosis demonstrated on detox ultrasound. He is on lisinopril hypertension with blood pressures measured at home in the 120 to 1:30 range.  Current Outpatient Prescriptions  Medication Sig Dispense Refill  . albuterol (PROVENTIL HFA;VENTOLIN HFA) 108 (90 BASE) MCG/ACT inhaler Inhale 2 puffs into the lungs every 6 (six) hours as needed for wheezing or shortness of breath. 1 Inhaler 5  . Ascorbic Acid (VITAMIN C WITH ROSE HIPS) 1000 MG tablet Take 1,000 mg by mouth daily.    Marland Kitchen aspirin EC 81 MG tablet Take 81 mg by mouth daily.    Marland Kitchen atorvastatin (LIPITOR) 40 MG tablet Take 1 tablet (40 mg total) by mouth daily. 30 tablet 11  . furosemide (LASIX) 40 MG tablet Take 40 mg by mouth daily as needed for fluid.    Marland Kitchen KRILL OIL PO Take by mouth daily.    Marland Kitchen latanoprost (XALATAN) 0.005 % ophthalmic solution Place 1 drop into both  eyes at bedtime.    Marland Kitchen lisinopril (PRINIVIL,ZESTRIL) 40 MG tablet Take 1 tablet (40 mg total) by mouth daily. 30 tablet 6  . loratadine (CLARITIN) 10 MG tablet Take 10 mg by mouth daily as needed.     . metFORMIN (GLUCOPHAGE-XR) 500 MG 24 hr tablet Take 500 mg by mouth daily with breakfast.    . Multiple Vitamin (MULTIVITAMIN WITH MINERALS) TABS Take 1 tablet by mouth daily.    Marland Kitchen omeprazole (PRILOSEC) 20 MG capsule Take 20 mg by mouth daily as needed.     . potassium chloride SA (K-DUR,KLOR-CON) 20 MEQ tablet Take 20 mEq by mouth daily as needed (take with lasix).    Marland Kitchen propylthiouracil (PTU) 50 MG tablet Take 50 mg by mouth daily.    . sotalol (BETAPACE) 160 MG tablet Take 320 mg by mouth daily.    Marland Kitchen warfarin (COUMADIN) 5 MG tablet TAKE 1 TO 1 AND 1/2 TABLETS DAILY AS DIRECTED BY COUMADIN CLINIC 120 tablet 1   No current facility-administered medications for this visit.    No Known Allergies  History   Social History  . Marital Status: Married    Spouse Name: N/A  . Number of Children: N/A  . Years of Education: N/A   Occupational History  . Not on file.   Social History Main Topics  . Smoking status: Current Every Day Smoker -- 0.12 packs/day for 25 years    Types: Cigarettes, Pipe  . Smokeless tobacco: Never Used  Comment: 08/04/2012 "still smoke a cigar q once in awhile"  . Alcohol Use: Yes     Comment: 08/04/2012 "once/month I might have a drink (beer, wine, or margarita)  . Drug Use: No  . Sexual Activity: No   Other Topics Concern  . Not on file   Social History Narrative     Review of Systems: General: negative for chills, fever, night sweats or weight changes.  Cardiovascular: negative for chest pain, dyspnea on exertion, edema, orthopnea, palpitations, paroxysmal nocturnal dyspnea or shortness of breath Dermatological: negative for rash Respiratory: negative for cough or wheezing Urologic: negative for hematuria Abdominal: negative for nausea, vomiting,  diarrhea, bright red blood per rectum, melena, or hematemesis Neurologic: negative for visual changes, syncope, or dizziness All other systems reviewed and are otherwise negative except as noted above.    Blood pressure 151/88, pulse 75, height 5\' 6"  (1.676 m), weight 167 lb 8 oz (75.978 kg).  General appearance: alert and no distress Neck: no adenopathy, no carotid bruit, no JVD, supple, symmetrical, trachea midline and thyroid not enlarged, symmetric, no tenderness/mass/nodules Lungs: clear to auscultation bilaterally Heart: irregularly irregular rhythm Extremities: extremities normal, atraumatic, no cyanosis or edema  EKG not performed today  ASSESSMENT AND PLAN:   Renal artery stenosis Shaun Mclaughlin was referred to me by Dr. Sallyanne Kuster for evaluation of renal artery stenosis. He does have hypertension on lisinopril. Renal Dopplers performed in our office suggested at least moderate left renal artery stenosis in the mid vessel, unusual for atherosclerosis. When reviewing his blood pressure log at home his blood pressures have run in the 120/130 range. At this time, I see no indication for renal intervention.      Lorretta Harp MD FACP,FACC,FAHA, [facility] 02/01/2015 4:34 PM

## 2015-02-01 NOTE — Assessment & Plan Note (Addendum)
Mr. Shaun Mclaughlin was referred to me by Dr. Sallyanne Kuster for evaluation of renal artery stenosis. He does have hypertension on lisinopril. Renal Dopplers performed in our office suggested at least moderate left renal artery stenosis in the mid vessel, unusual for atherosclerosis. When reviewing his blood pressure log at home his blood pressures have run in the 120/130 range. At this time, I see no indication for renal intervention.

## 2015-02-15 ENCOUNTER — Ambulatory Visit (INDEPENDENT_AMBULATORY_CARE_PROVIDER_SITE_OTHER): Payer: Medicare Other | Admitting: Pharmacist Clinician (PhC)/ Clinical Pharmacy Specialist

## 2015-02-15 VITALS — BP 154/70 | HR 44

## 2015-02-15 DIAGNOSIS — Z7901 Long term (current) use of anticoagulants: Secondary | ICD-10-CM

## 2015-02-15 DIAGNOSIS — I480 Paroxysmal atrial fibrillation: Secondary | ICD-10-CM | POA: Diagnosis not present

## 2015-02-15 DIAGNOSIS — I481 Persistent atrial fibrillation: Secondary | ICD-10-CM

## 2015-02-15 DIAGNOSIS — I4819 Other persistent atrial fibrillation: Secondary | ICD-10-CM

## 2015-02-15 LAB — POCT INR: INR: 2

## 2015-03-29 ENCOUNTER — Ambulatory Visit (INDEPENDENT_AMBULATORY_CARE_PROVIDER_SITE_OTHER): Payer: Medicare Other | Admitting: Pharmacist Clinician (PhC)/ Clinical Pharmacy Specialist

## 2015-03-29 DIAGNOSIS — I481 Persistent atrial fibrillation: Secondary | ICD-10-CM | POA: Diagnosis not present

## 2015-03-29 DIAGNOSIS — I480 Paroxysmal atrial fibrillation: Secondary | ICD-10-CM | POA: Diagnosis not present

## 2015-03-29 DIAGNOSIS — Z7901 Long term (current) use of anticoagulants: Secondary | ICD-10-CM | POA: Diagnosis not present

## 2015-03-29 DIAGNOSIS — I4819 Other persistent atrial fibrillation: Secondary | ICD-10-CM

## 2015-03-29 LAB — POCT INR: INR: 2.7

## 2015-03-30 ENCOUNTER — Other Ambulatory Visit: Payer: Self-pay | Admitting: Cardiovascular Disease

## 2015-05-03 ENCOUNTER — Ambulatory Visit (INDEPENDENT_AMBULATORY_CARE_PROVIDER_SITE_OTHER): Payer: Medicare Other | Admitting: Pharmacist Clinician (PhC)/ Clinical Pharmacy Specialist

## 2015-05-03 DIAGNOSIS — I481 Persistent atrial fibrillation: Secondary | ICD-10-CM | POA: Diagnosis not present

## 2015-05-03 DIAGNOSIS — I480 Paroxysmal atrial fibrillation: Secondary | ICD-10-CM | POA: Diagnosis not present

## 2015-05-03 DIAGNOSIS — I4819 Other persistent atrial fibrillation: Secondary | ICD-10-CM

## 2015-05-03 DIAGNOSIS — Z7901 Long term (current) use of anticoagulants: Secondary | ICD-10-CM | POA: Diagnosis not present

## 2015-05-03 LAB — POCT INR: INR: 1.7

## 2015-05-14 ENCOUNTER — Other Ambulatory Visit: Payer: Self-pay | Admitting: Cardiovascular Disease

## 2015-05-17 ENCOUNTER — Other Ambulatory Visit: Payer: Self-pay | Admitting: Family Medicine

## 2015-05-17 ENCOUNTER — Ambulatory Visit
Admission: RE | Admit: 2015-05-17 | Discharge: 2015-05-17 | Disposition: A | Discharge status: Home / Self Care | Payer: Medicare Other | Source: Ambulatory Visit | Attending: Family Medicine | Admitting: Family Medicine

## 2015-05-17 DIAGNOSIS — J209 Acute bronchitis, unspecified: Secondary | ICD-10-CM

## 2015-05-20 ENCOUNTER — Ambulatory Visit (INDEPENDENT_AMBULATORY_CARE_PROVIDER_SITE_OTHER): Payer: Medicare Other | Admitting: Pharmacist Clinician (PhC)/ Clinical Pharmacy Specialist

## 2015-05-20 DIAGNOSIS — I480 Paroxysmal atrial fibrillation: Secondary | ICD-10-CM | POA: Diagnosis not present

## 2015-05-20 DIAGNOSIS — I481 Persistent atrial fibrillation: Secondary | ICD-10-CM

## 2015-05-20 DIAGNOSIS — Z7901 Long term (current) use of anticoagulants: Secondary | ICD-10-CM | POA: Diagnosis not present

## 2015-05-20 DIAGNOSIS — I4819 Other persistent atrial fibrillation: Secondary | ICD-10-CM

## 2015-05-20 LAB — POCT INR: INR: 2.4

## 2015-05-24 ENCOUNTER — Ambulatory Visit: Payer: Medicare Other | Admitting: Pharmacist Clinician (PhC)/ Clinical Pharmacy Specialist

## 2015-06-14 ENCOUNTER — Ambulatory Visit (INDEPENDENT_AMBULATORY_CARE_PROVIDER_SITE_OTHER): Payer: Medicare Other | Admitting: Cardiovascular Disease

## 2015-06-14 ENCOUNTER — Ambulatory Visit (INDEPENDENT_AMBULATORY_CARE_PROVIDER_SITE_OTHER): Payer: Medicare Other | Admitting: Pharmacist Clinician (PhC)/ Clinical Pharmacy Specialist

## 2015-06-14 ENCOUNTER — Encounter: Payer: Self-pay | Admitting: Cardiovascular Disease

## 2015-06-14 VITALS — BP 156/70 | HR 48 | Resp 16 | Ht 67.0 in | Wt 170.1 lb

## 2015-06-14 DIAGNOSIS — I480 Paroxysmal atrial fibrillation: Secondary | ICD-10-CM

## 2015-06-14 DIAGNOSIS — I2510 Atherosclerotic heart disease of native coronary artery without angina pectoris: Secondary | ICD-10-CM | POA: Diagnosis not present

## 2015-06-14 DIAGNOSIS — I481 Persistent atrial fibrillation: Secondary | ICD-10-CM | POA: Diagnosis not present

## 2015-06-14 DIAGNOSIS — I4819 Other persistent atrial fibrillation: Secondary | ICD-10-CM

## 2015-06-14 DIAGNOSIS — Z7901 Long term (current) use of anticoagulants: Secondary | ICD-10-CM

## 2015-06-14 LAB — POCT INR: INR: 2.9

## 2015-06-14 NOTE — Progress Notes (Signed)
Patient Shaun Mclaughlin: Shaun Mclaughlin, male   DOB: 03-20-1935, 80 y.o.   MRN: [id number]      Cardiology Office Note   Date:  06/15/2015   Shaun Mclaughlin:  Shaun Mclaughlin, DOB [birth year], MRN [id number]  PCP:  Gara Kroner, MD  Cardiologist:   Sanda Klein, MD   Chief Complaint  Patient presents with  . Follow-up    6 months:  No complaints of chest pain, SOB, edema or dizziness.  Treated for pneumonia 3 weeks ago.      History of Present Illness: Shaun Mclaughlin is a 80 y.o. male who presents for  Follow-up of paroxysmal atrial fibrillation and sinus bradycardia. 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 He had pneumonia about 3 weeks ago treated with antibiotics. He has recovered from this now and has no change in his overall stamina and dyspnea compared with his last appointment.  He denies chest pain, either anginal or pleuritic. Functional class II exertional dyspnea. Frequent wheezing.  At his May appointment he had recurrent atrial flutter (probably typical counterclockwise flutter) after having had 2 cardioversions earlier in 2016. He presents today with a marked sinus bradycardia. It does not seem to be a big change in his symptoms whether he is in atrial flutter or sinus bradycardia.  As on past visits, he is markedly bradycardic with heart rate 48 bpm, but he denies problems of dizziness, syncope or fatigue. If previously discussed potential need for a pacemaker , but decided to defer this unless he becomes symptomatic. He is on chronic treatment with a low dose of sotalol and this has worked reasonably well to prevent atrial arrhythmia. He is on chronic anticoagulation with warfarin , without bleeding complications or embolic events. He has chronic hyperthyroidism , compensated on treatment with propylthiouracil.  He had paroxysmal atrial fibrillation for years that was well-controlled with sotalol and warfarin.  He does not have a history of stroke or TIA. He has coronary artery  disease and previously underwent placement of a stent to the right coronary artery in 2003, coronaries patent by cardiac catheterization in 2013. He has hypertension, diabetes mellitus and hyperlipidemia all of which are well compensated. He has peripheral arterial disease and received a stent to the distal left subclavian artery in January 2014 for left arm claudication (10 x 40 mm Cordis Smart nitinol). He is also known to have a 50-60% left renal artery stenosis. Echocardiogram and nuclear stress testing have shown reduction in LV systolic function (0000000 by scintigraphy, 45-50 percent by transthoracic echocardiography, 35% by transesophageal echo at the time of his last cardioversion). Nuclear stress testing shows an inferior wall scar without reversible ischemia (Nov 2013).  Past Medical History  Diagnosis Date  . Hypercholesteremia   . Pneumonia ~ 2008  . Chronic bronchitis ([place])     "about q yr" (08/04/2012)  . Hyperthyroidism   . Type II diabetes mellitus ([place])     "take RX; I'm prediabetic; don't have to  check my CBG qd; keep my weight down" (08/04/2012)  . GERD (gastroesophageal reflux disease)   . External bleeding hemorrhoids ~ 2003; 2008; 2013    "removed polyps and no bleeding since" (08/04/2012)  . Arthritis     "maybe a little bit in some joints" (08/04/2012)  . Kidney stone 1980's  . PVD (peripheral vascular disease) with claudication, Lt Upper ext. pain, and known 80-90%distal Lt. subclavian artery stenosis  08/05/2012  . CAD (coronary artery disease),Hx RCA stenting-patent 06/2012  08/05/2012  .  PAF (paroxysmal atrial fibrillation), maintaining SR 08/05/2012  . Chronic anticoagulation, coumadin for PAF 08/05/2012  . DM (diabetes mellitus) ([place]) 08/05/2012  . Hyperlipidemia 08/05/2012  . S/P angioplasty with stent to Lt. subclavian artery -Nitrinol stent 08/04/12 08/05/2012  . HTN (hypertension) 08/05/2012  . Acute on chronic combined systolic and diastolic congestive heart failure, NYHA  class 2 ([place]) 06/29/2014  . Atherosclerotic renal artery stenosis, unilateral ([facility])     lleft renal artery stenosis by duplex ultrasound    Past Surgical History  Procedure Laterality Date  . Coronary angioplasty with stent placement  2003    patent 12/13  . Cardiac catheterization  2008 and Dec 2013    patent coronaries  . Tonsillectomy  1940's  . Hernia repair  0000000    "umbilical" (99991111)  . Inguinal hernia repair  2000's    "right" (08/04/2012)  . Cataract extraction w/ intraocular lens implant      "right eye" (08/04/2012)  . Glaucoma surgery  2000's    "right eye; had laser procedure  to lower the pressure and prevent glaucoma" (08/04/2012)  . Lithotripsy  1980's    "imploded in my kidney; ended up having to be cut open in my back" (08/04/2012)  . Kidney stone surgery  1980's  . Subclavian stent placement  Jan 2014    Lt SCA  . Left heart catheterization with coronary angiogram N/A 06/28/2012    Procedure: LEFT HEART CATHETERIZATION WITH CORONARY ANGIOGRAM;  Surgeon: Lorretta Harp, MD;  Location: [facility] CATH LAB;  Service: Cardiovascular;  Laterality: N/A;  . Upper extremity angiogram Bilateral 06/28/2012    Procedure: UPPER EXTREMITY ANGIOGRAM;  Surgeon: Lorretta Harp, MD;  Location: [facility] CATH LAB;  Service: Cardiovascular;  Laterality: Bilateral;  . Unilateral upper extremeity angiogram N/A 08/04/2012    Procedure: UNILATERAL UPPER EXTREMEITY ANGIOGRAM;  Surgeon: Lorretta Harp, MD;  Location: [facility] CATH LAB;  Service: Cardiovascular;  Laterality: N/A;  . Cardioversion N/A 07/13/2014    Procedure: CARDIOVERSION;  Surgeon: Sanda Klein, MD;  Location: [facility] ENDOSCOPY;  Service: Cardiovascular;  Laterality: N/A;  . Cardioversion N/A 09/28/2014    Procedure: CARDIOVERSION;  Surgeon: Dorothy Spark, MD;  Location: [facility] ENDOSCOPY;  Service: Cardiovascular;  Laterality: N/A;     Current Outpatient Prescriptions  Medication Sig Dispense Refill  . albuterol (PROVENTIL HFA;VENTOLIN HFA)  108 (90 BASE) MCG/ACT inhaler Inhale 2 puffs into the lungs every 6 (six) hours as needed for wheezing or shortness of breath. 1 Inhaler 5  . Ascorbic Acid (VITAMIN C WITH ROSE HIPS) 1000 MG tablet Take 1,000 mg by mouth daily.    Marland Kitchen aspirin EC 81 MG tablet Take 81 mg by mouth daily.    Marland Kitchen atorvastatin (LIPITOR) 40 MG tablet Take 1 tablet (40 mg total) by mouth daily. 30 tablet 11  . furosemide (LASIX) 40 MG tablet Take 40 mg by mouth daily as needed for fluid.    Marland Kitchen KRILL OIL PO Take by mouth daily.    Marland Kitchen latanoprost (XALATAN) 0.005 % ophthalmic solution Place 1 drop into both eyes at bedtime.    Marland Kitchen lisinopril (PRINIVIL,ZESTRIL) 40 MG tablet Take 1 tablet (40 mg total) by mouth daily. 30 tablet 6  . loratadine (CLARITIN) 10 MG tablet Take 10 mg by mouth daily as needed.     . metFORMIN (GLUCOPHAGE-XR) 500 MG 24 hr tablet Take 500 mg by mouth daily with breakfast.    . Multiple Vitamin (MULTIVITAMIN WITH MINERALS) TABS Take 1 tablet by mouth daily.    Marland Kitchen  omeprazole (PRILOSEC) 20 MG capsule Take 20 mg by mouth daily as needed.     . potassium chloride SA (K-DUR,KLOR-CON) 20 MEQ tablet Take 20 mEq by mouth daily as needed (take with lasix).    Marland Kitchen propylthiouracil (PTU) 50 MG tablet Take 50 mg by mouth daily.    . sotalol (BETAPACE) 80 MG tablet take 1 tablet by mouth twice a day 60 tablet 6  . warfarin (COUMADIN) 5 MG tablet take 1 to 1 and 1/2 tablets by mouth as directed BY COUMADIN CLINIC 120 tablet 1   No current facility-administered medications for this visit.    Allergies:   Review of patient's allergies indicates no known allergies.    Social History:  The patient  reports that he has been smoking Cigarettes and Pipe.  He has a 3 pack-year smoking history. He has never used smokeless tobacco. He reports that he drinks alcohol. He reports that he does not use illicit drugs.   Family History:  The patient's family history includes Coronary artery disease (age of onset: 60) in his father;  Hypertension in his mother; Stroke (age of onset: 64) in his mother.    ROS:  Please see the history of present illness.    Otherwise, review of systems positive for none.   All other systems are reviewed and negative.    PHYSICAL EXAM: VS:  BP 156/70 mmHg  Pulse 48  Ht 5\' 7"  (1.702 m)  Wt 170 lb 1.6 oz (77.157 kg)  BMI 26.64 kg/m2 , BMI Body mass index is 26.64 kg/(m^2).  General: Alert, oriented x3, no distress Head: no evidence of trauma, PERRL, EOMI, no exophtalmos or lid lag, no myxedema, no xanthelasma; normal ears, nose and oropharynx Neck: normal jugular venous pulsations and no hepatojugular reflux; brisk carotid pulses without delay and no carotid bruits Chest: clear to auscultation but with diminished breath sounds throughout, no signs of consolidation by percussion or palpation, normal fremitus, symmetrical and full respiratory excursions Cardiovascular: normal position and quality of the apical impulse, regular rhythm, normal first and second heart sounds, no murmurs, rubs or gallops Abdomen: no tenderness or distention, no masses by palpation, no abnormal pulsatility or arterial bruits, normal bowel sounds, no hepatosplenomegaly Extremities: no clubbing, cyanosis or edema; 2+ radial, ulnar and brachial pulses bilaterally; 2+ right femoral, posterior tibial and dorsalis pedis pulses; 2+ left femoral, posterior tibial and dorsalis pedis pulses; no subclavian or femoral bruits Neurological: grossly nonfocal Psych: euthymic mood, full affect   EKG:  EKG is ordered today. The ekg ordered today demonstrates   sinus bradycardia 48 bpm and one PVC. There was a <2" pause that appears to be caused by a blocked PAC. QTC 485 ms.   Recent Labs: 07/09/2014: TSH 0.702 09/21/2014: ALT 15; BUN 15; Creat 0.86; Hemoglobin 14.8; Platelets 166; Potassium 4.4; Sodium 138    Lipid Panel No results found for: CHOL, TRIG, HDL, CHOLHDL, VLDL, LDLCALC, LDLDIRECT    Wt Readings from Last 3  Encounters:  06/14/15 170 lb 1.6 oz (77.157 kg)  02/01/15 167 lb 8 oz (75.978 kg)  01/08/15 165 lb 11.2 oz (75.161 kg)      ASSESSMENT AND PLAN:  Recurrent persistent atrial flutter (probably typical counterclockwise) ,  Currently sinus bradycardia. He has a previous history of atrial fibrillation. He seems to be tolerating the arrhythmia better. Amiodarone would very likely push him to need a pacemaker. We can't increase the dose of sotalol due to the degree of QT prolongation, also due to  the fact that this nonselective beta blocker may worsen his chronic obstructive lung disease. QTc 485 ms, acceptable.  PVD - s/p LSCA stent 08/04/12  Asymptomatic. Equal blood pressures in left and right upper extremity   CAD,Hx RCA stent Jan '03. patent at cath 06/2012  Newly reduced left ventricular systolic function compared to previous assessments, but the pattern of hypokinesis is diffuse and he has not had angina. Might be related to atrial arrhythmia? Most recent cath showed widely patent stent. Focus on long-term coronary risk factor modification.  Left ventricular systolic dysfunction Recheck EF in another couple of months  HTN (hypertension) and moderate left renal artery stenosis Well-controlled usually.  Blood pressure was normal when he saw Dr. Gwenlyn Found to discuss revascularization for renal artery stenosis  Combined systolic and diastolic heart failure, chronic Prompt improvement following diuretics is consistent with congestive heart failure. We'll need to reevaluate left ventricular systolic function once we have had successful maintenance of sinus rhythm for longer period of time.  Tobacco use He only smokes an occasional cigar, but I encouraged him to quit completely and permanently  Hyperlipidemia On statin, high dose. Labs performed with Dr. Moreen Fowler - will retrieve a copy. Due a lipid profile and needs renal function and K checked at least every 6 months.    Current medicines are  reviewed at length with the patient today.  The patient does not have concerns regarding medicines.  The following changes have been made:  no change  Labs/ tests ordered today include:  Orders Placed This Encounter  Procedures  . EKG 12-Lead    Patient Instructions  Dr. Sallyanne Kuster recommends that you schedule a follow-up appointment in: [place], Levorn Oleski, MD  06/15/2015 5:56 PM    Sanda Klein, MD, [facility] [phone] office [phone] pager

## 2015-06-14 NOTE — Patient Instructions (Signed)
Dr. Croitoru recommends that you schedule a follow-up appointment in: 6 MONTHS   

## 2015-06-15 ENCOUNTER — Encounter: Payer: Self-pay | Admitting: Cardiovascular Disease

## 2015-06-25 ENCOUNTER — Other Ambulatory Visit: Payer: Self-pay | Admitting: Cardiovascular Disease

## 2015-06-25 NOTE — Telephone Encounter (Signed)
REFILL 

## 2015-07-24 ENCOUNTER — Ambulatory Visit: Payer: Medicare Other | Admitting: Pharmacist Clinician (PhC)/ Clinical Pharmacy Specialist

## 2015-07-26 ENCOUNTER — Ambulatory Visit (INDEPENDENT_AMBULATORY_CARE_PROVIDER_SITE_OTHER): Payer: Medicare Other | Admitting: Pharmacist Clinician (PhC)/ Clinical Pharmacy Specialist

## 2015-07-26 DIAGNOSIS — I480 Paroxysmal atrial fibrillation: Secondary | ICD-10-CM | POA: Diagnosis not present

## 2015-07-26 DIAGNOSIS — I481 Persistent atrial fibrillation: Secondary | ICD-10-CM

## 2015-07-26 DIAGNOSIS — Z7901 Long term (current) use of anticoagulants: Secondary | ICD-10-CM | POA: Diagnosis not present

## 2015-07-26 DIAGNOSIS — I4819 Other persistent atrial fibrillation: Secondary | ICD-10-CM

## 2015-07-26 LAB — POCT INR: INR: 2.6

## 2015-07-30 ENCOUNTER — Other Ambulatory Visit: Payer: Self-pay

## 2015-07-30 MED ORDER — FUROSEMIDE 40 MG PO TABS: 40.0000 mg | ORAL_TABLET | Freq: Every day | ORAL | Status: DC

## 2015-07-30 NOTE — Telephone Encounter (Signed)
Rx(s) sent to pharmacy electronically.  

## 2015-08-02 ENCOUNTER — Other Ambulatory Visit: Payer: Self-pay | Admitting: *Deleted

## 2015-08-02 MED ORDER — POTASSIUM CHLORIDE CRYS ER 20 MEQ PO TBCR: 20.0000 meq | EXTENDED_RELEASE_TABLET | Freq: Every day | ORAL | Status: DC | PRN

## 2015-09-06 ENCOUNTER — Ambulatory Visit (INDEPENDENT_AMBULATORY_CARE_PROVIDER_SITE_OTHER): Payer: Medicare Other | Admitting: Pharmacist Clinician (PhC)/ Clinical Pharmacy Specialist

## 2015-09-06 ENCOUNTER — Encounter: Payer: Medicare Other | Admitting: Pharmacist Clinician (PhC)/ Clinical Pharmacy Specialist

## 2015-09-06 DIAGNOSIS — Z7901 Long term (current) use of anticoagulants: Secondary | ICD-10-CM

## 2015-09-06 DIAGNOSIS — I480 Paroxysmal atrial fibrillation: Secondary | ICD-10-CM | POA: Diagnosis not present

## 2015-09-06 DIAGNOSIS — I481 Persistent atrial fibrillation: Secondary | ICD-10-CM

## 2015-09-06 DIAGNOSIS — I4819 Other persistent atrial fibrillation: Secondary | ICD-10-CM

## 2015-09-06 LAB — POCT INR: INR: 2.6

## 2015-10-18 ENCOUNTER — Ambulatory Visit (INDEPENDENT_AMBULATORY_CARE_PROVIDER_SITE_OTHER): Payer: Medicare Other | Admitting: Pharmacist Clinician (PhC)/ Clinical Pharmacy Specialist

## 2015-10-18 DIAGNOSIS — I480 Paroxysmal atrial fibrillation: Secondary | ICD-10-CM | POA: Diagnosis not present

## 2015-10-18 DIAGNOSIS — I4819 Other persistent atrial fibrillation: Secondary | ICD-10-CM

## 2015-10-18 DIAGNOSIS — Z7901 Long term (current) use of anticoagulants: Secondary | ICD-10-CM

## 2015-10-18 DIAGNOSIS — I481 Persistent atrial fibrillation: Secondary | ICD-10-CM | POA: Diagnosis not present

## 2015-10-18 LAB — POCT INR: INR: 3

## 2015-11-29 ENCOUNTER — Ambulatory Visit (INDEPENDENT_AMBULATORY_CARE_PROVIDER_SITE_OTHER): Payer: Medicare Other | Admitting: Pharmacist Clinician (PhC)/ Clinical Pharmacy Specialist

## 2015-11-29 ENCOUNTER — Encounter: Payer: Medicare Other | Admitting: Pharmacist Clinician (PhC)/ Clinical Pharmacy Specialist

## 2015-11-29 DIAGNOSIS — I481 Persistent atrial fibrillation: Secondary | ICD-10-CM

## 2015-11-29 DIAGNOSIS — I480 Paroxysmal atrial fibrillation: Secondary | ICD-10-CM

## 2015-11-29 DIAGNOSIS — Z7901 Long term (current) use of anticoagulants: Secondary | ICD-10-CM

## 2015-11-29 DIAGNOSIS — I4819 Other persistent atrial fibrillation: Secondary | ICD-10-CM

## 2015-11-29 LAB — POCT INR: INR: 5.2

## 2015-12-02 ENCOUNTER — Other Ambulatory Visit: Payer: Self-pay | Admitting: Cardiovascular Disease

## 2015-12-02 NOTE — Telephone Encounter (Signed)
REFILL 

## 2015-12-13 ENCOUNTER — Ambulatory Visit (INDEPENDENT_AMBULATORY_CARE_PROVIDER_SITE_OTHER): Payer: Medicare Other | Admitting: Pharmacist

## 2015-12-13 DIAGNOSIS — Z7901 Long term (current) use of anticoagulants: Secondary | ICD-10-CM

## 2015-12-13 DIAGNOSIS — I4819 Other persistent atrial fibrillation: Secondary | ICD-10-CM

## 2015-12-13 DIAGNOSIS — I481 Persistent atrial fibrillation: Secondary | ICD-10-CM

## 2015-12-13 DIAGNOSIS — I480 Paroxysmal atrial fibrillation: Secondary | ICD-10-CM | POA: Diagnosis not present

## 2015-12-13 LAB — POCT INR: INR: 2.3

## 2015-12-20 ENCOUNTER — Ambulatory Visit (INDEPENDENT_AMBULATORY_CARE_PROVIDER_SITE_OTHER): Payer: Medicare Other | Admitting: Cardiovascular Disease

## 2015-12-20 ENCOUNTER — Encounter: Payer: Self-pay | Admitting: Cardiovascular Disease

## 2015-12-20 ENCOUNTER — Ambulatory Visit (INDEPENDENT_AMBULATORY_CARE_PROVIDER_SITE_OTHER): Payer: Medicare Other | Admitting: Pharmacist Clinician (PhC)/ Clinical Pharmacy Specialist

## 2015-12-20 VITALS — BP 168/70 | HR 45 | Ht 67.0 in | Wt 169.2 lb

## 2015-12-20 DIAGNOSIS — I4819 Other persistent atrial fibrillation: Secondary | ICD-10-CM

## 2015-12-20 DIAGNOSIS — I5043 Acute on chronic combined systolic (congestive) and diastolic (congestive) heart failure: Secondary | ICD-10-CM

## 2015-12-20 DIAGNOSIS — E785 Hyperlipidemia, unspecified: Secondary | ICD-10-CM

## 2015-12-20 DIAGNOSIS — I480 Paroxysmal atrial fibrillation: Secondary | ICD-10-CM | POA: Diagnosis not present

## 2015-12-20 DIAGNOSIS — Z7901 Long term (current) use of anticoagulants: Secondary | ICD-10-CM

## 2015-12-20 DIAGNOSIS — I481 Persistent atrial fibrillation: Secondary | ICD-10-CM | POA: Diagnosis not present

## 2015-12-20 DIAGNOSIS — I2510 Atherosclerotic heart disease of native coronary artery without angina pectoris: Secondary | ICD-10-CM | POA: Diagnosis not present

## 2015-12-20 DIAGNOSIS — E1151 Type 2 diabetes mellitus with diabetic peripheral angiopathy without gangrene: Secondary | ICD-10-CM

## 2015-12-20 DIAGNOSIS — I739 Peripheral vascular disease, unspecified: Secondary | ICD-10-CM

## 2015-12-20 DIAGNOSIS — R001 Bradycardia, unspecified: Secondary | ICD-10-CM | POA: Diagnosis not present

## 2015-12-20 LAB — POCT INR: INR: 3.3

## 2015-12-20 MED ORDER — SOTALOL HCL 80 MG PO TABS: 40.0000 mg | ORAL_TABLET | Freq: Two times a day (BID) | ORAL | Status: DC

## 2015-12-20 NOTE — Progress Notes (Signed)
Patient ID: Shaun Mclaughlin, male   DOB: 1935-06-09, 81 y.o.   MRN: [id number]    Cardiology Office Note    Date:  12/20/2015   ID:  Shaun Mclaughlin, DOB 07-Mar-1935, MRN [id number]  PCP:  Gara Kroner, MD  Cardiologist:   Sanda Klein, MD   Chief Complaint  Patient presents with  . Follow-up    patient reports fatigue and lack of energy with his slow heart rate.     History of Present Illness:  Shaun Mclaughlin is a 81 y.o. male with a long-standing history of atrial arrhythmia (persistent atrial fibrillation and atrial flutter requiring cardioversion in the past), peripheral arterial disease, coronary artery disease (last revascularization procedure left subclavian stent 2014), controlled type 2 diabetes mellitus and hypertension.  Since the summer months of, and is more active he has noticed more fatigue secondary to the bradycardia. Has to climb 4-5 flights of stairs at his workplace and has to stop 2 or 3 times due to fatigue. He denies dyspnea. He has not had recent problems with edema and has not needed to adjust his dose of furosemide in several weeks. He has noticed that his warfarin requirements are slowly decreasing and has had occasional problems with markedly elevated INR. He does not have any overt bleeding problems except related to injuries at work. He has not had syncope, dizziness or palpitations. Denies intermittent claudication in either the upper or lower extremities.  He had paroxysmal atrial fibrillation for years that was well-controlled with sotalol and warfarin. He does not have a history of stroke or TIA. He has coronary artery disease and previously underwent placement of a stent to the right coronary artery in 2003, coronaries patent by cardiac catheterization in 2013. He has hypertension, diabetes mellitus and hyperlipidemia all of which are well compensated. He has peripheral arterial disease and received a stent to the distal left subclavian artery in January 2014  for left arm claudication (10 x 40 mm Cordis Smart nitinol). He is also known to have a 50-60% left renal artery stenosis. Echocardiogram and nuclear stress testing have shown reduction in LV systolic function (0000000 by scintigraphy, 45-50 percent by transthoracic echocardiography, 35% by transesophageal echo at the time of his last cardioversion). Nuclear stress testing shows an inferior wall scar without reversible ischemia (Nov 2013).   Past Medical History  Diagnosis Date  . Hypercholesteremia   . Pneumonia ~ 2008  . Chronic bronchitis ([place])     "about q yr" (08/04/2012)  . Hyperthyroidism   . Type II diabetes mellitus ([place])     "take RX; I'm prediabetic; don't have to  check my CBG qd; keep my weight down" (08/04/2012)  . GERD (gastroesophageal reflux disease)   . External bleeding hemorrhoids ~ 2003; 2008; 2013    "removed polyps and no bleeding since" (08/04/2012)  . Arthritis     "maybe a little bit in some joints" (08/04/2012)  . Kidney stone 1980's  . PVD (peripheral vascular disease) with claudication, Lt Upper ext. pain, and known 80-90%distal Lt. subclavian artery stenosis  08/05/2012  . CAD (coronary artery disease),Hx RCA stenting-patent 06/2012  08/05/2012  . PAF (paroxysmal atrial fibrillation), maintaining SR 08/05/2012  . Chronic anticoagulation, coumadin for PAF 08/05/2012  . DM (diabetes mellitus) ([employer]) 08/05/2012  . Hyperlipidemia 08/05/2012  . S/P angioplasty with stent to Lt. subclavian artery -Nitrinol stent 08/04/12 08/05/2012  . HTN (hypertension) 08/05/2012  . Acute on chronic combined systolic and diastolic congestive heart failure, NYHA class 2 (  [place]) 06/29/2014  . Atherosclerotic renal artery stenosis, unilateral (HCC)     lleft renal artery stenosis by duplex ultrasound    Past Surgical History  Procedure Laterality Date  . Coronary angioplasty with stent placement  2003    patent 12/13  . Cardiac catheterization  2008 and Dec 2013    patent coronaries  .  Tonsillectomy  1940's  . Hernia repair  0000000    "umbilical" (99991111)  . Inguinal hernia repair  2000's    "right" (08/04/2012)  . Cataract extraction w/ intraocular lens implant      "right eye" (08/04/2012)  . Glaucoma surgery  2000's    "right eye; had laser procedure  to lower the pressure and prevent glaucoma" (08/04/2012)  . Lithotripsy  1980's    "imploded in my kidney; ended up having to be cut open in my back" (08/04/2012)  . Kidney stone surgery  1980's  . Subclavian stent placement  Jan 2014    Lt SCA  . Left heart catheterization with coronary angiogram N/A 06/28/2012    Procedure: LEFT HEART CATHETERIZATION WITH CORONARY ANGIOGRAM;  Surgeon: Lorretta Harp, MD;  Location: [facility] CATH LAB;  Service: Cardiovascular;  Laterality: N/A;  . Upper extremity angiogram Bilateral 06/28/2012    Procedure: UPPER EXTREMITY ANGIOGRAM;  Surgeon: Lorretta Harp, MD;  Location: [facility] CATH LAB;  Service: Cardiovascular;  Laterality: Bilateral;  . Unilateral upper extremeity angiogram N/A 08/04/2012    Procedure: UNILATERAL UPPER EXTREMEITY ANGIOGRAM;  Surgeon: Lorretta Harp, MD;  Location: [facility] CATH LAB;  Service: Cardiovascular;  Laterality: N/A;  . Cardioversion N/A 07/13/2014    Procedure: CARDIOVERSION;  Surgeon: Sanda Klein, MD;  Location: [facility] ENDOSCOPY;  Service: Cardiovascular;  Laterality: N/A;  . Cardioversion N/A 09/28/2014    Procedure: CARDIOVERSION;  Surgeon: Dorothy Spark, MD;  Location: [facility] ENDOSCOPY;  Service: Cardiovascular;  Laterality: N/A;    Current Medications: Outpatient Prescriptions Prior to Visit  Medication Sig Dispense Refill  . albuterol (PROVENTIL HFA;VENTOLIN HFA) 108 (90 BASE) MCG/ACT inhaler Inhale 2 puffs into the lungs every 6 (six) hours as needed for wheezing or shortness of breath. 1 Inhaler 5  . Ascorbic Acid (VITAMIN C WITH ROSE HIPS) 1000 MG tablet Take 1,000 mg by mouth daily.    Marland Kitchen atorvastatin (LIPITOR) 40 MG tablet Take 1 tablet (40 mg total) by mouth  daily. 30 tablet 11  . furosemide (LASIX) 40 MG tablet Take 1 tablet (40 mg total) by mouth daily. 30 tablet 10  . KRILL OIL PO Take by mouth daily.    Marland Kitchen latanoprost (XALATAN) 0.005 % ophthalmic solution Place 1 drop into both eyes at bedtime.    Marland Kitchen lisinopril (PRINIVIL,ZESTRIL) 40 MG tablet take 1 tablet by mouth once daily 30 tablet 6  . loratadine (CLARITIN) 10 MG tablet Take 10 mg by mouth daily as needed.     . metFORMIN (GLUCOPHAGE-XR) 500 MG 24 hr tablet Take 500 mg by mouth daily with breakfast.    . Multiple Vitamin (MULTIVITAMIN WITH MINERALS) TABS Take 1 tablet by mouth daily.    Marland Kitchen omeprazole (PRILOSEC) 20 MG capsule Take 20 mg by mouth daily as needed.     . potassium chloride SA (K-DUR,KLOR-CON) 20 MEQ tablet Take 1 tablet (20 mEq total) by mouth daily as needed (take with lasix). 30 tablet 5  . propylthiouracil (PTU) 50 MG tablet Take 50 mg by mouth daily.    Marland Kitchen warfarin (COUMADIN) 5 MG tablet take 1 to 1 and 1/2 tablets  by mouth as directed BY COUMADIN CLINIC 120 tablet 1  . aspirin EC 81 MG tablet Take 81 mg by mouth daily.    . sotalol (BETAPACE) 80 MG tablet Take 1 tablet (80 mg total) by mouth 2 (two) times daily. KEEP OV. 60 tablet 0   No facility-administered medications prior to visit.     Allergies:   Review of patient's allergies indicates no known allergies.   Social History   Social History  . Marital Status: Married    Spouse Name: N/A  . Number of Children: N/A  . Years of Education: N/A   Social History Main Topics  . Smoking status: Current Every Day Smoker -- 0.12 packs/day for 25 years    Types: Cigarettes, Pipe  . Smokeless tobacco: Never Used     Comment: 08/04/2012 "still smoke a cigar q once in awhile"  . Alcohol Use: Yes     Comment: 08/04/2012 "once/month I might have a drink (beer, wine, or margarita)  . Drug Use: No  . Sexual Activity: No   Other Topics Concern  . None   Social History Narrative     Family History:  The patient's family  history includes Coronary artery disease (age of onset: 42) in his father; Hypertension in his mother; Stroke (age of onset: 68) in his mother.   ROS:   Please see the history of present illness.    ROS All other systems reviewed and are negative.   PHYSICAL EXAM:   VS:  BP 168/70 mmHg  Pulse 45  Ht 5\' 7"  (1.702 m)  Wt 76.749 kg (169 lb 3.2 oz)  BMI 26.49 kg/m2   GEN: Well nourished, well developed, in no acute distress HEENT: normal Neck: no JVD, carotid bruits, or masses Cardiac: bradycardia, RRR; no murmurs, rubs, or gallops,no edema  Respiratory: A few scattered rhonchi and wheezes, otherwise clear to auscultation bilaterally, normal work of breathing GI: soft, nontender, nondistended, + BS MS: no deformity or atrophy Skin: warm and dry, no rash Neuro:  Alert and Oriented x 3, Strength and sensation are intact Psych: euthymic mood, full affect  Wt Readings from Last 3 Encounters:  12/20/15 76.749 kg (169 lb 3.2 oz)  06/14/15 77.157 kg (170 lb 1.6 oz)  02/01/15 75.978 kg (167 lb 8 oz)      Studies/Labs Reviewed:   EKG:  EKG is ordered today.  The ekg ordered today demonstrates Marked sinus bradycardia at 45 bpm, small inferior Q waves, QT interval 560 ms, corrected QTC 484 ms    ASSESSMENT:    1. Bradycardia, sinus, persistent, severe   2. Persistent atrial fibrillation ([place])   3. Acute on chronic combined systolic and diastolic congestive heart failure, NYHA class 2 ([place])   4. Coronary artery disease involving native coronary artery of native heart without angina pectoris   5. PVD - s/p LSCA stent 08/04/12   6. Hyperlipidemia   7. Chronic anticoagulation, coumadin for PAF   8. Type 2 diabetes mellitus with diabetic peripheral angiopathy without gangrene, without long-term current use of insulin (HCC)      PLAN:  In order of problems listed above:  1. SSS: His bradycardia is more than one would expect just from the effects of sotalol and he clearly has some  degree of sinus node dysfunction. Previously recommended that he consider pacemaker therapy and reiterated that today. He remains a little reluctant and would like to try cutting back on the dose of sotalol first. We discussed the fact  that this will likely lead to more arrhythmia, sinus at 40 mg per day sotalol was not a true antiarrhythmic. He would like to try this nonetheless. Haven't come back in 3 months and reevaluate need for pacemaker therapy or antiarrhythmic change  2. AFib: This has not been asymptomatic problem in the last year or so. In 2016 he had 2 cardioversions and at least on one occasion had typical counterclockwise atrial flutter, although he is well-documented to have had atrial fibrillation. Continue warfarin anticoagulation, although we did discuss switching to a more predictable direct oral anticoagulant. Stop aspirin.  3. CHF: Seems to be well compensated, his symptoms are more consistent with bradycardia (fatigue) rather than heart failure (dyspnea or edema). He has not required diuretic dose adjustment recently. NYHA functional class II. He is still working full-time.  4. CAD: Denies angina pectoris with activity (much more active than the usual patient his age).  5. PAD: Asymptomatic  6. HLP: Labs from PCP  7. See above  8. DM: Well controlled    Medication Adjustments/Labs and Tests Ordered: Current medicines are reviewed at length with the patient today.  Concerns regarding medicines are outlined above.  Medication changes, Labs and Tests ordered today are listed in the Patient Instructions below. Patient Instructions  Dr Sallyanne Kuster has recommended making the following medication changes: 1. STOP Aspirin 2. DECREASE Sotalol to 40 mg - take 0.5 tablet (of 80 mg tablets) twice daily  Dr Sallyanne Kuster recommends that you schedule a follow-up appointment in 3 months.  If you need a refill on your cardiac medications before your next appointment, please call your  pharmacy.  Pacemaker Implantation The heart has its own electrical system, or natural pacemaker, to regulate the heartbeat. Sometimes, the natural pacemaker system of the heart fails and causes the heart to beat too slowly. If this happens, a pacemaker can be surgically placed to help the heart beat at a normal or programmed rate. A pacemaker is a small, battery-powered device that is placed under the skin and is programmed to sense your heartbeats. If your heart rate is lower than the programmed rate, the pacemaker will pace your heart. Parts of a pacemaker include:  Wires or leads. The leads are placed in the heart and transmit electricity to the heart. The leads are connected to the pulse generator.  Pulse generator. The pulse generator contains a computer and a memory system. The pulse generator also produces the electrical signal that triggers the heart to beat. A pacemaker may be placed if:  You have a slow heartbeat (bradycardia).  You have fainting (syncope).  Shortness of breath (dyspnea) due to heart problems. LET [facility] CARE PROVIDER KNOW ABOUT:  Any allergies you may have.  All medicines you are taking, including vitamins, herbs, eye drops, creams, and over-the-counter medicines.  Previous problems you or members of your family have had with the use of anesthetics.  Any blood disorders you have.  Previous surgeries you have had.  Medical conditions you have.  Possibility of pregnancy, if this applies. RISKS AND COMPLICATIONS Generally, pacemaker implantation is a safe procedure. However, problems can occur and include:  Bleeding.  Unable to place the pacemaker under local sedation.  Infection. BEFORE THE PROCEDURE  You will have blood work drawn before the procedure.  Do not use any tobacco products including cigarettes, chewing tobacco, or electronic cigarettes. If you need help quitting, ask your health care provider.  Do not eat or drink anything after  midnight on the night  before the procedure or as directed by your health care provider.  Ask your health care provider about:  Changing or stopping your regular medicines. This is especially important if you are taking diabetes medicines or blood thinners.  Taking medicines such as aspirin and ibuprofen. These medicines can thin your blood. Do not take these medicines before your procedure if your health care provider asks you not to.  Ask your health care provider if you can take a sip of water with any approved medicines the morning of the procedure. PROCEDURE  The surgery to place a pacemaker is considered a minimally invasive surgical procedure. It is done under a local anesthetic, which is an injection at the incision site that makes the skin numb. You are also given sedation and pain medicine that makes you drowsy during the procedure.   An intravenous line (IV) will be started in your hand or arm so sedation and pain medicine can be given during the pacemaker procedure.  A numbing medicine will be injected into the skin where the pacemaker is to be placed. A small incision will then be made into the skin. The pacemaker is usually placed under the skin near the collarbone.  After the incision has been made, the leads will be inserted into a large vein and guided into the heart using X-ray.  Using the same incision that was used to place the leads, a small pocket will be created under the skin to hold the pulse generator. The leads will then be connected to the pulse generator.  The incision site will then be closed. A bandage (dressing) is placed over the pacemaker site. The dressing is removed 24-48 hours afterward. AFTER THE PROCEDURE  You will be taken to a recovery area after the pacemaker implant. Your vital signs such as blood pressure, heart rate, breathing, and oxygen levels will be monitored.  A chest X-ray will be done after the pacemaker has been implanted. This is to make  sure the pacemaker and leads are in the correct place.   This information is not intended to replace advice given to you by your health care provider. Make sure you discuss any questions you have with your health care provider.   Document Released: 07/03/2002 Document Revised: 08/03/2014 Document Reviewed: 11/17/2011 Elsevier Interactive Patient Education 2016 [place], Sanda Klein, MD  12/20/2015 8:32 AM    [facility] [address], [place],   [address] Phone: [phone]; Fax: [phone]

## 2015-12-20 NOTE — Patient Instructions (Signed)
Dr Sallyanne Kuster has recommended making the following medication changes: 1. STOP Aspirin 2. DECREASE Sotalol to 40 mg - take 0.5 tablet (of 80 mg tablets) twice daily  Dr Sallyanne Kuster recommends that you schedule a follow-up appointment in 3 months.  If you need a refill on your cardiac medications before your next appointment, please call your pharmacy.  Pacemaker Implantation The heart has its own electrical system, or natural pacemaker, to regulate the heartbeat. Sometimes, the natural pacemaker system of the heart fails and causes the heart to beat too slowly. If this happens, a pacemaker can be surgically placed to help the heart beat at a normal or programmed rate. A pacemaker is a small, battery-powered device that is placed under the skin and is programmed to sense your heartbeats. If your heart rate is lower than the programmed rate, the pacemaker will pace your heart. Parts of a pacemaker include:  Wires or leads. The leads are placed in the heart and transmit electricity to the heart. The leads are connected to the pulse generator.  Pulse generator. The pulse generator contains a computer and a memory system. The pulse generator also produces the electrical signal that triggers the heart to beat. A pacemaker may be placed if:  You have a slow heartbeat (bradycardia).  You have fainting (syncope).  Shortness of breath (dyspnea) due to heart problems. LET [facility] CARE PROVIDER KNOW ABOUT:  Any allergies you may have.  All medicines you are taking, including vitamins, herbs, eye drops, creams, and over-the-counter medicines.  Previous problems you or members of your family have had with the use of anesthetics.  Any blood disorders you have.  Previous surgeries you have had.  Medical conditions you have.  Possibility of pregnancy, if this applies. RISKS AND COMPLICATIONS Generally, pacemaker implantation is a safe procedure. However, problems can occur and  include:  Bleeding.  Unable to place the pacemaker under local sedation.  Infection. BEFORE THE PROCEDURE  You will have blood work drawn before the procedure.  Do not use any tobacco products including cigarettes, chewing tobacco, or electronic cigarettes. If you need help quitting, ask your health care provider.  Do not eat or drink anything after midnight on the night before the procedure or as directed by your health care provider.  Ask your health care provider about:  Changing or stopping your regular medicines. This is especially important if you are taking diabetes medicines or blood thinners.  Taking medicines such as aspirin and ibuprofen. These medicines can thin your blood. Do not take these medicines before your procedure if your health care provider asks you not to.  Ask your health care provider if you can take a sip of water with any approved medicines the morning of the procedure. PROCEDURE  The surgery to place a pacemaker is considered a minimally invasive surgical procedure. It is done under a local anesthetic, which is an injection at the incision site that makes the skin numb. You are also given sedation and pain medicine that makes you drowsy during the procedure.   An intravenous line (IV) will be started in your hand or arm so sedation and pain medicine can be given during the pacemaker procedure.  A numbing medicine will be injected into the skin where the pacemaker is to be placed. A small incision will then be made into the skin. The pacemaker is usually placed under the skin near the collarbone.  After the incision has been made, the leads will be inserted into a  large vein and guided into the heart using X-ray.  Using the same incision that was used to place the leads, a small pocket will be created under the skin to hold the pulse generator. The leads will then be connected to the pulse generator.  The incision site will then be closed. A bandage  (dressing) is placed over the pacemaker site. The dressing is removed 24-48 hours afterward. AFTER THE PROCEDURE  You will be taken to a recovery area after the pacemaker implant. Your vital signs such as blood pressure, heart rate, breathing, and oxygen levels will be monitored.  A chest X-ray will be done after the pacemaker has been implanted. This is to make sure the pacemaker and leads are in the correct place.   This information is not intended to replace advice given to you by your health care provider. Make sure you discuss any questions you have with your health care provider.   Document Released: 07/03/2002 Document Revised: 08/03/2014 Document Reviewed: 11/17/2011 Elsevier Interactive Patient Education [employer].

## 2016-01-10 ENCOUNTER — Ambulatory Visit (INDEPENDENT_AMBULATORY_CARE_PROVIDER_SITE_OTHER): Payer: Medicare Other | Admitting: Pharmacist

## 2016-01-10 VITALS — BP 126/64 | HR 57

## 2016-01-10 DIAGNOSIS — I480 Paroxysmal atrial fibrillation: Secondary | ICD-10-CM

## 2016-01-10 DIAGNOSIS — Z7901 Long term (current) use of anticoagulants: Secondary | ICD-10-CM | POA: Diagnosis not present

## 2016-01-10 DIAGNOSIS — I481 Persistent atrial fibrillation: Secondary | ICD-10-CM | POA: Diagnosis not present

## 2016-01-10 DIAGNOSIS — I4819 Other persistent atrial fibrillation: Secondary | ICD-10-CM

## 2016-01-10 LAB — POCT INR: INR: 3.1

## 2016-01-10 MED ORDER — SOTALOL HCL 80 MG PO TABS: 40.0000 mg | ORAL_TABLET | Freq: Two times a day (BID) | ORAL | Status: DC

## 2016-01-31 ENCOUNTER — Ambulatory Visit (INDEPENDENT_AMBULATORY_CARE_PROVIDER_SITE_OTHER): Payer: Medicare Other | Admitting: Pharmacist Clinician (PhC)/ Clinical Pharmacy Specialist

## 2016-01-31 DIAGNOSIS — I480 Paroxysmal atrial fibrillation: Secondary | ICD-10-CM

## 2016-01-31 DIAGNOSIS — I4819 Other persistent atrial fibrillation: Secondary | ICD-10-CM

## 2016-01-31 DIAGNOSIS — I481 Persistent atrial fibrillation: Secondary | ICD-10-CM

## 2016-01-31 DIAGNOSIS — Z7901 Long term (current) use of anticoagulants: Secondary | ICD-10-CM

## 2016-01-31 LAB — POCT INR: INR: 2.3

## 2016-01-31 MED ORDER — SOTALOL HCL 80 MG PO TABS: 40.0000 mg | ORAL_TABLET | Freq: Two times a day (BID) | ORAL | Status: DC

## 2016-02-04 ENCOUNTER — Other Ambulatory Visit: Payer: Self-pay | Admitting: Cardiovascular Disease

## 2016-02-28 ENCOUNTER — Ambulatory Visit (INDEPENDENT_AMBULATORY_CARE_PROVIDER_SITE_OTHER): Payer: Medicare Other | Admitting: Pharmacist

## 2016-02-28 DIAGNOSIS — I481 Persistent atrial fibrillation: Secondary | ICD-10-CM | POA: Diagnosis not present

## 2016-02-28 DIAGNOSIS — I480 Paroxysmal atrial fibrillation: Secondary | ICD-10-CM

## 2016-02-28 DIAGNOSIS — I4819 Other persistent atrial fibrillation: Secondary | ICD-10-CM

## 2016-02-28 DIAGNOSIS — Z7901 Long term (current) use of anticoagulants: Secondary | ICD-10-CM

## 2016-02-28 LAB — POCT INR: INR: 2.3

## 2016-03-04 ENCOUNTER — Telehealth: Payer: Self-pay | Admitting: Cardiovascular Disease

## 2016-03-04 NOTE — Telephone Encounter (Signed)
Returned call to patient who stated procedure went well yesterday but he has had to go back to the doctors office twice now due to the bleeding. Instructed him it would be ok for him to hold a dose or two until we get bleeding under control as he is low risk. He stated he understands and appreciates help.

## 2016-03-04 NOTE — Telephone Encounter (Signed)
New message    Patient calling S/P surgery on yesterday . Wants to know can he stop his warfarin for one day to get the blood uncontrolled.

## 2016-03-20 ENCOUNTER — Encounter: Payer: Self-pay | Admitting: Cardiovascular Disease

## 2016-03-20 ENCOUNTER — Ambulatory Visit (INDEPENDENT_AMBULATORY_CARE_PROVIDER_SITE_OTHER): Payer: Medicare Other | Admitting: Pharmacist Clinician (PhC)/ Clinical Pharmacy Specialist

## 2016-03-20 ENCOUNTER — Other Ambulatory Visit: Payer: Self-pay | Admitting: Pharmacist Clinician (PhC)/ Clinical Pharmacy Specialist

## 2016-03-20 ENCOUNTER — Ambulatory Visit (INDEPENDENT_AMBULATORY_CARE_PROVIDER_SITE_OTHER): Payer: Medicare Other | Admitting: Cardiovascular Disease

## 2016-03-20 VITALS — BP 140/82 | HR 83 | Ht 67.0 in | Wt 166.4 lb

## 2016-03-20 DIAGNOSIS — Z7901 Long term (current) use of anticoagulants: Secondary | ICD-10-CM

## 2016-03-20 DIAGNOSIS — I2510 Atherosclerotic heart disease of native coronary artery without angina pectoris: Secondary | ICD-10-CM | POA: Diagnosis not present

## 2016-03-20 DIAGNOSIS — I481 Persistent atrial fibrillation: Secondary | ICD-10-CM

## 2016-03-20 DIAGNOSIS — I739 Peripheral vascular disease, unspecified: Secondary | ICD-10-CM

## 2016-03-20 DIAGNOSIS — T50905A Adverse effect of unspecified drugs, medicaments and biological substances, initial encounter: Secondary | ICD-10-CM

## 2016-03-20 DIAGNOSIS — I4819 Other persistent atrial fibrillation: Secondary | ICD-10-CM

## 2016-03-20 DIAGNOSIS — E1151 Type 2 diabetes mellitus with diabetic peripheral angiopathy without gangrene: Secondary | ICD-10-CM

## 2016-03-20 DIAGNOSIS — I5042 Chronic combined systolic (congestive) and diastolic (congestive) heart failure: Secondary | ICD-10-CM

## 2016-03-20 DIAGNOSIS — I150 Renovascular hypertension: Secondary | ICD-10-CM

## 2016-03-20 DIAGNOSIS — R001 Bradycardia, unspecified: Secondary | ICD-10-CM | POA: Diagnosis not present

## 2016-03-20 DIAGNOSIS — E785 Hyperlipidemia, unspecified: Secondary | ICD-10-CM

## 2016-03-20 DIAGNOSIS — I480 Paroxysmal atrial fibrillation: Secondary | ICD-10-CM

## 2016-03-20 LAB — POCT INR: INR: 2

## 2016-03-20 MED ORDER — WARFARIN SODIUM 5 MG PO TABS: ORAL_TABLET | ORAL | 1 refills | Status: DC

## 2016-03-20 MED ORDER — SOTALOL HCL 120 MG PO TABS: 60.0000 mg | ORAL_TABLET | Freq: Two times a day (BID) | ORAL | 11 refills | Status: DC

## 2016-03-20 MED ORDER — ATORVASTATIN CALCIUM 40 MG PO TABS: 40.0000 mg | ORAL_TABLET | Freq: Every day | ORAL | 11 refills | Status: DC

## 2016-03-20 MED ORDER — LISINOPRIL 40 MG PO TABS: 40.0000 mg | ORAL_TABLET | Freq: Every day | ORAL | 11 refills | Status: DC

## 2016-03-20 NOTE — Progress Notes (Signed)
Patient ID: Shaun Mclaughlin, male   DOB: 22-Aug-1934, 81 y.o.   MRN: [id number]    Cardiology Office Note    Date:  03/20/2016   ID:  Shaun Mclaughlin, DOB 29-Sep-1934, MRN [id number]  PCP:  Gara Kroner, MD  Cardiologist:   Sanda Klein, MD   Chief Complaint  Patient presents with  . Follow-up    History of Present Illness:  Shaun Mclaughlin is a 81 y.o. male with a long-standing history of atrial arrhythmia (persistent atrial fibrillation and atrial flutter requiring cardioversion in the past), peripheral arterial disease, coronary artery disease (last revascularization procedure left subclavian stent 2014), controlled type 2 diabetes mellitus and hypertension.  After decreasing the dose of sotalol, he complains of less fatigue. His heart rates when he comes in for prothrombin time checks has been around 60. Today he is in atrial flutter and his heart rate is in the 80s.  Complains of some nasal congestion, but does not have dysphagia, wheezing, cough, fever or chills.  He denies worsening dyspnea. He has not had recent problems with edema and has not needed to adjust his dose of furosemide in several weeks. He does not have any overt bleeding problems except related to injuries at work. He has not had syncope, dizziness or palpitations. Denies intermittent claudication in either the upper or lower extremities.  He had paroxysmal atrial fibrillation and atrial flutter for years.  This was well-controlled with sotalol, the dose had to be decreased secondary to bradycardia. He is reluctant to undergo pacemaker implantation. He is compliant with warfarin and PT/INR checks. He does not have a history of stroke or TIA. He has coronary artery disease and previously underwent placement of a stent to the right coronary artery in 2003, coronaries patent by cardiac catheterization in 2013. He has hypertension, diabetes mellitus and hyperlipidemia all of which are well compensated. He has peripheral  arterial disease and received a stent to the distal left subclavian artery in January 2014 for left arm claudication (10 x 40 mm Cordis Smart nitinol). He is also known to have a 50-60% left renal artery stenosis. Echocardiogram and nuclear stress testing have shown reduction in LV systolic function (0000000 by scintigraphy, 45-50 % by transthoracic echocardiography, 35% by transesophageal echo at the time of his last cardioversion). Nuclear stress testing shows an inferior wall scar without reversible ischemia (Nov 2013).   Past Medical History:  Diagnosis Date  . Acute on chronic combined systolic and diastolic congestive heart failure, NYHA class 2 ([place]) 06/29/2014  . Arthritis    "maybe a little bit in some joints" (08/04/2012)  . Atherosclerotic renal artery stenosis, unilateral (HCC)    lleft renal artery stenosis by duplex ultrasound  . CAD (coronary artery disease),Hx RCA stenting-patent 06/2012  08/05/2012  . Chronic anticoagulation, coumadin for PAF 08/05/2012  . Chronic bronchitis ([place])    "about q yr" (08/04/2012)  . DM (diabetes mellitus) ([place]) 08/05/2012  . External bleeding hemorrhoids ~ 2003; 2008; 2013   "removed polyps and no bleeding since" (08/04/2012)  . GERD (gastroesophageal reflux disease)   . HTN (hypertension) 08/05/2012  . Hypercholesteremia   . Hyperlipidemia 08/05/2012  . Hyperthyroidism   . Kidney stone 1980's  . PAF (paroxysmal atrial fibrillation), maintaining SR 08/05/2012  . Pneumonia ~ 2008  . PVD (peripheral vascular disease) with claudication, Lt Upper ext. pain, and known 80-90%distal Lt. subclavian artery stenosis  08/05/2012  . S/P angioplasty with stent to Lt. subclavian artery -Nitrinol stent 08/04/12 08/05/2012  .  Type II diabetes mellitus ([place])    "take RX; I'm prediabetic; don't have to  check my CBG qd; keep my weight down" (08/04/2012)    Past Surgical History:  Procedure Laterality Date  . CARDIAC CATHETERIZATION  2008 and Dec 2013   patent coronaries  .  CARDIOVERSION N/A 07/13/2014   Procedure: CARDIOVERSION;  Surgeon: Sanda Klein, MD;  Location: [facility];  Service: Cardiovascular;  Laterality: N/A;  . CARDIOVERSION N/A 09/28/2014   Procedure: CARDIOVERSION;  Surgeon: Dorothy Spark, MD;  Location: [facility];  Service: Cardiovascular;  Laterality: N/A;  . CATARACT EXTRACTION W/ INTRAOCULAR LENS IMPLANT     "right eye" (08/04/2012)  . CORONARY ANGIOPLASTY WITH STENT PLACEMENT  2003   patent 12/13  . GLAUCOMA SURGERY  2000's   "right eye; had laser procedure  to lower the pressure and prevent glaucoma" (08/04/2012)  . HERNIA REPAIR  0000000   "umbilical" (99991111)  . INGUINAL HERNIA REPAIR  2000's   "right" (08/04/2012)  . KIDNEY STONE SURGERY  1980's  . LEFT HEART CATHETERIZATION WITH CORONARY ANGIOGRAM N/A 06/28/2012   Procedure: LEFT HEART CATHETERIZATION WITH CORONARY ANGIOGRAM;  Surgeon: Lorretta Harp, MD;  Location: [facility] CATH LAB;  Service: Cardiovascular;  Laterality: N/A;  . LITHOTRIPSY  1980's   "imploded in my kidney; ended up having to be cut open in my back" (08/04/2012)  . SUBCLAVIAN STENT PLACEMENT  Jan 2014   Lt SCA  . TONSILLECTOMY  1940's  . UNILATERAL UPPER EXTREMEITY ANGIOGRAM N/A 08/04/2012   Procedure: UNILATERAL UPPER EXTREMEITY ANGIOGRAM;  Surgeon: Lorretta Harp, MD;  Location: [facility] CATH LAB;  Service: Cardiovascular;  Laterality: N/A;  . UPPER EXTREMITY ANGIOGRAM Bilateral 06/28/2012   Procedure: UPPER EXTREMITY ANGIOGRAM;  Surgeon: Lorretta Harp, MD;  Location: [facility] CATH LAB;  Service: Cardiovascular;  Laterality: Bilateral;    Current Medications: Outpatient Medications Prior to Visit  Medication Sig Dispense Refill  . albuterol (PROVENTIL HFA;VENTOLIN HFA) 108 (90 BASE) MCG/ACT inhaler Inhale 2 puffs into the lungs every 6 (six) hours as needed for wheezing or shortness of breath. 1 Inhaler 5  . Ascorbic Acid (VITAMIN C WITH ROSE HIPS) 1000 MG tablet Take 1,000 mg by mouth daily.    . furosemide (LASIX) 40  MG tablet Take 1 tablet (40 mg total) by mouth daily. 30 tablet 10  . KRILL OIL PO Take by mouth daily.    Marland Kitchen latanoprost (XALATAN) 0.005 % ophthalmic solution Place 1 drop into both eyes at bedtime.    Marland Kitchen loratadine (CLARITIN) 10 MG tablet Take 10 mg by mouth daily as needed.     . metFORMIN (GLUCOPHAGE-XR) 500 MG 24 hr tablet Take 500 mg by mouth daily with breakfast.    . Multiple Vitamin (MULTIVITAMIN WITH MINERALS) TABS Take 1 tablet by mouth daily.    Marland Kitchen omeprazole (PRILOSEC) 20 MG capsule Take 20 mg by mouth daily as needed.     . potassium chloride SA (K-DUR,KLOR-CON) 20 MEQ tablet Take 1 tablet (20 mEq total) by mouth daily as needed (take with lasix). 30 tablet 5  . propylthiouracil (PTU) 50 MG tablet Take 50 mg by mouth daily.    Marland Kitchen atorvastatin (LIPITOR) 40 MG tablet Take 1 tablet (40 mg total) by mouth daily. 30 tablet 11  . lisinopril (PRINIVIL,ZESTRIL) 40 MG tablet take 1 tablet by mouth once daily 30 tablet 6  . sotalol (BETAPACE) 80 MG tablet Take 0.5 tablets (40 mg total) by mouth 2 (two) times daily. 30 tablet 1  .  warfarin (COUMADIN) 5 MG tablet take 1 to 1 and 1/2 tablets by mouth as directed BY COUMADIN CLINIC 120 tablet 1   No facility-administered medications prior to visit.      Allergies:   Review of patient's allergies indicates no known allergies.   Social History   Social History  . Marital status: Married    Spouse name: N/A  . Number of children: N/A  . Years of education: N/A   Social History Main Topics  . Smoking status: Current Every Day Smoker    Packs/day: 0.12    Years: 25.00    Types: Cigarettes, Pipe  . Smokeless tobacco: Never Used     Comment: 08/04/2012 "still smoke a cigar q once in awhile"  . Alcohol use Yes     Comment: 08/04/2012 "once/month I might have a drink (beer, wine, or margarita)  . Drug use: No  . Sexual activity: No   Other Topics Concern  . None   Social History Narrative  . None     Family History:  The patient's family  history includes Coronary artery disease (age of onset: 52) in his father; Hypertension in his mother; Stroke (age of onset: 15) in his mother.   ROS:   Please see the history of present illness.    ROS All other systems reviewed and are negative.   PHYSICAL EXAM:   VS:  BP 140/82 (BP Location: Right Arm, Patient Position: Sitting, Cuff Size: Normal)   Pulse 83   Ht 5\' 7"  (1.702 m)   Wt 166 lb 6.4 oz (75.5 kg)   SpO2 97%   BMI 26.06 kg/m    GEN: Well nourished, well developed, in no acute distress  HEENT: normal  Neck: no JVD, carotid bruits, or masses Cardiac: Irregular; no murmurs, rubs, or gallops,no edema  Respiratory: A few scattered rhonchi and wheezes, otherwise clear to auscultation bilaterally, normal work of breathing GI: soft, nontender, nondistended, + BS MS: no deformity or atrophy  Skin: warm and dry, no rash Neuro:  Alert and Oriented x 3, Strength and sensation are intact Psych: euthymic mood, full affect  Wt Readings from Last 3 Encounters:  03/20/16 166 lb 6.4 oz (75.5 kg)  12/20/15 169 lb 3.2 oz (76.7 kg)  06/14/15 170 lb 1.6 oz (77.2 kg)      Studies/Labs Reviewed:   EKG:  EKG is ordered today.  The ekg ordered today demonstrates Coarse atrial fibrillation, possibly atrial flutter, left axis deviation, small inferior Q waves, corrected QTC 477 ms  LABS: February 2017 hemoglobin A1c 6.6%, cholesterol 105, triglycerides 85, HDL 25, LDL 64  ASSESSMENT:    1. Persistent atrial fibrillation ([place])   2. Bradycardia, drug induced   3. Coronary artery disease involving native coronary artery of native heart without angina pectoris   4. Chronic combined systolic and diastolic heart failure, NYHA class 2 ([place])   5. PVD - s/p LSCA stent 08/04/12   6. Renovascular hypertension   7. Hyperlipidemia   8. Chronic anticoagulation, coumadin for PAF   9. Type 2 diabetes mellitus with diabetic peripheral angiopathy without gangrene, without long-term current use of  insulin (HCC)      PLAN:  In order of problems listed above:  1. AFib: As suspected, the current dose of sotalol is probably not offering true antiarrhythmic effect. On the other hand, he does not appear to be particularly symptomatic when in atrial fibrillation. Rate control is good. In 2016 he had 2 cardioversions and at least  on one occasion had typical counterclockwise atrial flutter, although he is well-documented to have had atrial fibrillation. Continue warfarin anticoagulation. CHADSVasc 6 (age 55, vascular disease, HTN, DM, CHF). He would like to try slightly higher dose of sotalol at 60 mg twice daily. In the past, he has had acceptable QT interval prolongation even on 80 mg twice daily of sotalol, so do not anticipate problems on a 60 mg twice daily dose.  2. Sinus bradycardia: Appears disproportionate for sotalol-only effects. Suspect he has underlying sinus node dysfunction. Pacemaker therapy has been offered but declined. We'll try to see if he can tolerate a slightly higher dose of sotalol.  3. CHF: Seems to be well compensated. He has not required diuretic dose adjustment recently. NYHA functional class II. He is still working full-time.  4. CAD: Denies angina pectoris with activity (much more active than the usual patient his age).  5. PAD: Asymptomatic  6. HLP: Labs from PCP  7. DM: Well controlled  8. COPD: Probably the biggest contributor to his exertional dyspnea   Medication Adjustments/Labs and Tests Ordered: Current medicines are reviewed at length with the patient today.  Concerns regarding medicines are outlined above.  Medication changes, Labs and Tests ordered today are listed in the Patient Instructions below. Patient Instructions  Dr Sallyanne Kuster has recommended making the following medication changes: 1. INCREASE Sotalol to 60 mg twice daily - take half of a 120 mg tablet twice daily  Dr C recommends that you schedule a follow-up appointment in 3 months.  If  you need a refill on your cardiac medications before your next appointment, please call your pharmacy.    Signed, Sanda Klein, MD  03/20/2016 8:50 AM    [facility] [address], [place], [place]  [address] Phone: [phone]; Fax: [phone]

## 2016-03-20 NOTE — Patient Instructions (Signed)
Dr Sallyanne Kuster has recommended making the following medication changes: 1. INCREASE Sotalol to 60 mg twice daily - take half of a 120 mg tablet twice daily  Dr C recommends that you schedule a follow-up appointment in 3 months.  If you need a refill on your cardiac medications before your next appointment, please call your pharmacy.

## 2016-04-17 ENCOUNTER — Ambulatory Visit (INDEPENDENT_AMBULATORY_CARE_PROVIDER_SITE_OTHER): Payer: Medicare Other | Admitting: Pharmacist

## 2016-04-17 DIAGNOSIS — I481 Persistent atrial fibrillation: Secondary | ICD-10-CM

## 2016-04-17 DIAGNOSIS — Z7901 Long term (current) use of anticoagulants: Secondary | ICD-10-CM | POA: Diagnosis not present

## 2016-04-17 DIAGNOSIS — I4819 Other persistent atrial fibrillation: Secondary | ICD-10-CM

## 2016-04-17 DIAGNOSIS — I480 Paroxysmal atrial fibrillation: Secondary | ICD-10-CM

## 2016-04-17 LAB — POCT INR: INR: 2.8

## 2016-05-29 ENCOUNTER — Ambulatory Visit (INDEPENDENT_AMBULATORY_CARE_PROVIDER_SITE_OTHER): Payer: Medicare Other | Admitting: Pharmacist

## 2016-05-29 DIAGNOSIS — I481 Persistent atrial fibrillation: Secondary | ICD-10-CM

## 2016-05-29 DIAGNOSIS — I4819 Other persistent atrial fibrillation: Secondary | ICD-10-CM

## 2016-05-29 DIAGNOSIS — Z7901 Long term (current) use of anticoagulants: Secondary | ICD-10-CM | POA: Diagnosis not present

## 2016-05-29 DIAGNOSIS — I480 Paroxysmal atrial fibrillation: Secondary | ICD-10-CM | POA: Diagnosis not present

## 2016-05-29 LAB — POCT INR: INR: 2.7

## 2016-06-26 ENCOUNTER — Ambulatory Visit (INDEPENDENT_AMBULATORY_CARE_PROVIDER_SITE_OTHER): Payer: Medicare Other | Admitting: Cardiovascular Disease

## 2016-06-26 ENCOUNTER — Ambulatory Visit (INDEPENDENT_AMBULATORY_CARE_PROVIDER_SITE_OTHER): Payer: Medicare Other | Admitting: Pharmacist

## 2016-06-26 ENCOUNTER — Encounter: Payer: Self-pay | Admitting: Cardiovascular Disease

## 2016-06-26 VITALS — BP 110/70 | HR 71 | Ht 67.0 in | Wt 167.4 lb

## 2016-06-26 DIAGNOSIS — I4819 Other persistent atrial fibrillation: Secondary | ICD-10-CM

## 2016-06-26 DIAGNOSIS — I481 Persistent atrial fibrillation: Secondary | ICD-10-CM

## 2016-06-26 DIAGNOSIS — E1151 Type 2 diabetes mellitus with diabetic peripheral angiopathy without gangrene: Secondary | ICD-10-CM

## 2016-06-26 DIAGNOSIS — I2510 Atherosclerotic heart disease of native coronary artery without angina pectoris: Secondary | ICD-10-CM

## 2016-06-26 DIAGNOSIS — Z7901 Long term (current) use of anticoagulants: Secondary | ICD-10-CM

## 2016-06-26 DIAGNOSIS — I5042 Chronic combined systolic (congestive) and diastolic (congestive) heart failure: Secondary | ICD-10-CM | POA: Diagnosis not present

## 2016-06-26 DIAGNOSIS — I480 Paroxysmal atrial fibrillation: Secondary | ICD-10-CM | POA: Diagnosis not present

## 2016-06-26 DIAGNOSIS — R001 Bradycardia, unspecified: Secondary | ICD-10-CM

## 2016-06-26 DIAGNOSIS — E782 Mixed hyperlipidemia: Secondary | ICD-10-CM

## 2016-06-26 DIAGNOSIS — J449 Chronic obstructive pulmonary disease, unspecified: Secondary | ICD-10-CM | POA: Insufficient documentation

## 2016-06-26 DIAGNOSIS — I739 Peripheral vascular disease, unspecified: Secondary | ICD-10-CM

## 2016-06-26 DIAGNOSIS — J439 Emphysema, unspecified: Secondary | ICD-10-CM

## 2016-06-26 LAB — POCT INR: INR: 2.4

## 2016-06-26 MED ORDER — METOPROLOL TARTRATE 25 MG PO TABS: 25.0000 mg | ORAL_TABLET | Freq: Two times a day (BID) | ORAL | 11 refills | Status: DC

## 2016-06-26 NOTE — Progress Notes (Signed)
Patient ID: Shaun Mclaughlin, male   DOB: 20-Jan-1935, 81 y.o.   MRN: [id number]    Cardiology Office Note    Date:  06/26/2016   ID:  Shaun Mclaughlin, DOB 1935/01/07, MRN [id number]  PCP:  Gara Kroner, MD  Cardiologist:   Sanda Klein, MD   Chief Complaint  Patient presents with  . Follow-up    History of Present Illness:  Shaun Mclaughlin is a 81 y.o. male with a long-standing history of atrial arrhythmia (persistent atrial fibrillation and atrial flutter requiring cardioversion in the past), peripheral arterial disease, coronary artery disease (last revascularization procedure left subclavian stent 2014), controlled type 2 diabetes mellitus and hypertension.  He is in atrial fibrillation with great ventricular rate control. He continues to work full-time. Today he woke up with a lot of pain in his left hip both in the greater trochanter area and in his left groin and is walking with a limp. There is no swelling or discoloration of the limb and he has good distal pulses.  He became intolerant of sotalol due to fatigue and bradycardia. Discussed various options for arrhythmia control, including hospitalization to start dofetilide or implantation of a dual-chamber permanent pacemaker. He prefers the least aggressive approach and is becoming more and more apparent that the arrhythmia itself is not causing much in the way of symptoms. He is therapeutically anticoagulated with warfarin.  He is compliant with warfarin and PT/INR checks. He does not have a history of stroke or TIA. He has coronary artery disease and previously underwent placement of a stent to the right coronary artery in 2003, coronaries patent by cardiac catheterization in 2013. He has hypertension, diabetes mellitus and hyperlipidemia all of which are well compensated. He has peripheral arterial disease and received a stent to the distal left subclavian artery in January 2014 for left arm claudication (10 x 40 mm Cordis Smart  nitinol). He is also known to have a 50-60% left renal artery stenosis. Echocardiogram and nuclear stress testing have shown reduction in LV systolic function (0000000 by scintigraphy, 45-50 % by transthoracic echocardiography, 35% by transesophageal echo at the time of his last cardioversion). Nuclear stress testing shows an inferior wall scar without reversible ischemia (Nov 2013).   Past Medical History:  Diagnosis Date  . Acute on chronic combined systolic and diastolic congestive heart failure, NYHA class 2 ([place]) 06/29/2014  . Arthritis    "maybe a little bit in some joints" (08/04/2012)  . Atherosclerotic renal artery stenosis, unilateral (HCC)    lleft renal artery stenosis by duplex ultrasound  . CAD (coronary artery disease),Hx RCA stenting-patent 06/2012  08/05/2012  . Chronic anticoagulation, coumadin for PAF 08/05/2012  . Chronic bronchitis ([place])    "about q yr" (08/04/2012)  . DM (diabetes mellitus) ([place]) 08/05/2012  . External bleeding hemorrhoids ~ 2003; 2008; 2013   "removed polyps and no bleeding since" (08/04/2012)  . GERD (gastroesophageal reflux disease)   . HTN (hypertension) 08/05/2012  . Hypercholesteremia   . Hyperlipidemia 08/05/2012  . Hyperthyroidism   . Kidney stone 1980's  . PAF (paroxysmal atrial fibrillation), maintaining SR 08/05/2012  . Pneumonia ~ 2008  . PVD (peripheral vascular disease) with claudication, Lt Upper ext. pain, and known 80-90%distal Lt. subclavian artery stenosis  08/05/2012  . S/P angioplasty with stent to Lt. subclavian artery -Nitrinol stent 08/04/12 08/05/2012  . Type II diabetes mellitus ([place])    "take RX; I'm prediabetic; don't have to  check my CBG qd; keep my weight down" (  08/04/2012)    Past Surgical History:  Procedure Laterality Date  . CARDIAC CATHETERIZATION  2008 and Dec 2013   patent coronaries  . CARDIOVERSION N/A 07/13/2014   Procedure: CARDIOVERSION;  Surgeon: Sanda Klein, MD;  Location: [facility];  Service: Cardiovascular;   Laterality: N/A;  . CARDIOVERSION N/A 09/28/2014   Procedure: CARDIOVERSION;  Surgeon: Dorothy Spark, MD;  Location: [facility] [place];  Service: Cardiovascular;  Laterality: N/A;  . CATARACT EXTRACTION W/ INTRAOCULAR LENS IMPLANT     "right eye" (08/04/2012)  . CORONARY ANGIOPLASTY WITH STENT PLACEMENT  2003   patent 12/13  . GLAUCOMA SURGERY  2000's   "right eye; had laser procedure  to lower the pressure and prevent glaucoma" (08/04/2012)  . HERNIA REPAIR  0000000   "umbilical" (99991111)  . INGUINAL HERNIA REPAIR  2000's   "right" (08/04/2012)  . KIDNEY STONE SURGERY  1980's  . LEFT HEART CATHETERIZATION WITH CORONARY ANGIOGRAM N/A 06/28/2012   Procedure: LEFT HEART CATHETERIZATION WITH CORONARY ANGIOGRAM;  Surgeon: Lorretta Harp, MD;  Location: [facility];  Service: Cardiovascular;  Laterality: N/A;  . LITHOTRIPSY  1980's   "imploded in my kidney; ended up having to be cut open in my back" (08/04/2012)  . SUBCLAVIAN STENT PLACEMENT  Jan 2014   Lt SCA  . TONSILLECTOMY  1940's  . UNILATERAL UPPER EXTREMEITY ANGIOGRAM N/A 08/04/2012   Procedure: UNILATERAL UPPER EXTREMEITY ANGIOGRAM;  Surgeon: Lorretta Harp, MD;  Location: [facility] CATH LAB;  Service: Cardiovascular;  Laterality: N/A;  . UPPER EXTREMITY ANGIOGRAM Bilateral 06/28/2012   Procedure: UPPER EXTREMITY ANGIOGRAM;  Surgeon: Lorretta Harp, MD;  Location: [facility] CATH LAB;  Service: Cardiovascular;  Laterality: Bilateral;    Current Medications: Outpatient Medications Prior to Visit  Medication Sig Dispense Refill  . albuterol (PROVENTIL HFA;VENTOLIN HFA) 108 (90 BASE) MCG/ACT inhaler Inhale 2 puffs into the lungs every 6 (six) hours as needed for wheezing or shortness of breath. 1 Inhaler 5  . Ascorbic Acid (VITAMIN C WITH ROSE HIPS) 1000 MG tablet Take 1,000 mg by mouth daily.    Marland Kitchen atorvastatin (LIPITOR) 40 MG tablet Take 1 tablet (40 mg total) by mouth daily. 30 tablet 11  . furosemide (LASIX) 40 MG tablet Take 1 tablet (40 mg total) by  mouth daily. 30 tablet 10  . KRILL OIL PO Take by mouth daily.    Marland Kitchen latanoprost (XALATAN) 0.005 % ophthalmic solution Place 1 drop into both eyes at bedtime.    Marland Kitchen lisinopril (PRINIVIL,ZESTRIL) 40 MG tablet Take 1 tablet (40 mg total) by mouth daily. 30 tablet 11  . loratadine (CLARITIN) 10 MG tablet Take 10 mg by mouth daily as needed.     . metFORMIN (GLUCOPHAGE-XR) 500 MG 24 hr tablet Take 500 mg by mouth daily with breakfast.    . Multiple Vitamin (MULTIVITAMIN WITH MINERALS) TABS Take 1 tablet by mouth daily.    Marland Kitchen omeprazole (PRILOSEC) 20 MG capsule Take 20 mg by mouth daily as needed.     . potassium chloride SA (K-DUR,KLOR-CON) 20 MEQ tablet Take 1 tablet (20 mEq total) by mouth daily as needed (take with lasix). 30 tablet 5  . propylthiouracil (PTU) 50 MG tablet Take 50 mg by mouth daily.    Marland Kitchen warfarin (COUMADIN) 5 MG tablet take 1 to 1 and 1/2 tablets by mouth as directed BY COUMADIN CLINIC 120 tablet 1  . sotalol (BETAPACE) 120 MG tablet Take 0.5 tablets (60 mg total) by mouth 2 (two) times daily. 30 tablet  11   No facility-administered medications prior to visit.      Allergies:   Patient has no known allergies.   Social History   Social History  . Marital status: Married    Spouse name: N/A  . Number of children: N/A  . Years of education: N/A   Social History Main Topics  . Smoking status: Current Every Day Smoker    Packs/day: 0.12    Years: 25.00    Types: Cigarettes, Pipe  . Smokeless tobacco: Never Used     Comment: 08/04/2012 "still smoke a cigar q once in awhile"  . Alcohol use Yes     Comment: 08/04/2012 "once/month I might have a drink (beer, wine, or margarita)  . Drug use: No  . Sexual activity: No   Other Topics Concern  . None   Social History Narrative  . None     Family History:  The patient's family history includes Coronary artery disease (age of onset: 85) in his father; Hypertension in his mother; Stroke (age of onset: 39) in his mother.   ROS:    Please see the history of present illness.    ROS All other systems reviewed and are negative.   PHYSICAL EXAM:   VS:  BP 110/70   Pulse 71   Ht 5\' 7"  (1.702 m)   Wt 167 lb 6.4 oz (75.9 kg)   BMI 26.22 kg/m    GEN: Well nourished, well developed, in no acute distress  HEENT: normal  Neck: no JVD, carotid bruits, or masses Cardiac: Irregular; no murmurs, rubs, or gallops,no edema  Respiratory: A few scattered rhonchi and wheezes, otherwise clear to auscultation bilaterally, normal work of breathing GI: soft, nontender, nondistended, + BS MS: no deformity or atrophy  Skin: warm and dry, no rash Neuro:  Alert and Oriented x 3, Strength and sensation are intact Psych: euthymic mood, full affect  Wt Readings from Last 3 Encounters:  06/26/16 167 lb 6.4 oz (75.9 kg)  03/20/16 166 lb 6.4 oz (75.5 kg)  12/20/15 169 lb 3.2 oz (76.7 kg)      Studies/Labs Reviewed:   EKG:  EKG is ordered today.  The ekg ordered today demonstrates atrial fibrillation Couple of wide complex beats (PVCs versus aberrancy), incomplete right bundle branch block, small inferior Q waves, corrected QTC 521 ms  LABS: February 2017 hemoglobin A1c 6.6%, cholesterol 105, triglycerides 85, HDL 25, LDL 64  ASSESSMENT:    1. Chronic combined systolic and diastolic heart failure, NYHA class 2 (HCC)   2. Persistent atrial fibrillation ([place])   3. Bradycardia, sinus, persistent, severe   4. Coronary artery disease involving native coronary artery of native heart without angina pectoris   5. PVD - s/p LSCA stent 08/04/12   6. Mixed hyperlipidemia   7. Type 2 diabetes mellitus with diabetic peripheral angiopathy without gangrene, without long-term current use of insulin ([place])   8. Pulmonary emphysema, unspecified emphysema type ([place])   9. Chronic anticoagulation, coumadin for PAF      PLAN:  In order of problems listed above:  1. AFib: As suspected, the current dose of sotalol is probably not offering true  antiarrhythmic effect. He does not appear to be particularly symptomatic when in atrial fibrillation. Reviewed our options again today and decided to switch to a rate control strategy. Continue warfarin anticoagulation. CHADSVasc 6 (age 64, vascular disease, HTN, DM, CHF). Stop sotalol. This should take care of the current QT prolongation as well. Will substitute metoprolol 25  mg twice daily and reevaluate rate control in a few months. He might require slightly higher dose of beta blocker. Although he has COPD, he also has coronary disease and a beta blocker will be preferable to a calcium channel blocker. 2. SSS: Appears disproportionate for sotalol-only effects. Suspect he has underlying sinus node dysfunction. Pacemaker therapy has been offered but declined.  3. CHF: Seems to be well compensated. He has not required diuretic dose adjustment recently. NYHA functional class II. He is still working full-time. 4. CAD: Denies angina pectoris with activity (much more active than the usual patient his age). 5. PAD: Asymptomatic 6. HLP: HDL remains very low, but all his other lipid parameters are well within the desirable range. I don't think he'll be able to increase his level of physical activity and he is relatively lean. 7. DM: Well controlled. 8. COPD: Probably the biggest contributor to his exertional dyspnea. If wheezing becomes a big problem, may have to switch from metoprolol to a calcium channel blocker. 9. Warfarin: Therapeutic anticoagulated today. Do not expect major interactions when we switched from sotalol to metoprolol.   Medication Adjustments/Labs and Tests Ordered: Current medicines are reviewed at length with the patient today.  Concerns regarding medicines are outlined above.  Medication changes, Labs and Tests ordered today are listed in the Patient Instructions below. Patient Instructions  Dr Sallyanne Kuster has recommended making the following medication changes: 1. STOP Sotalol 2. START  Metoprolol 25 mg - take 1 tablet by mouth twice daily  Your physician recommends that you schedule a follow-up appointment in 3 months with Dr C.  If you need a refill on your cardiac medications before your next appointment, please call your pharmacy.    Signed, Sanda Klein, MD  06/26/2016 3:19 PM    [address], [place], [place]  [address] Phone: [phone]; Fax: [phone]

## 2016-06-26 NOTE — Patient Instructions (Signed)
Dr Sallyanne Kuster has recommended making the following medication changes: 1. STOP Sotalol 2. START Metoprolol 25 mg - take 1 tablet by mouth twice daily  Your physician recommends that you schedule a follow-up appointment in 3 months with Dr C.  If you need a refill on your cardiac medications before your next appointment, please call your pharmacy.

## 2016-08-07 ENCOUNTER — Ambulatory Visit (INDEPENDENT_AMBULATORY_CARE_PROVIDER_SITE_OTHER): Payer: Medicare Other | Admitting: Pharmacist

## 2016-08-07 DIAGNOSIS — I480 Paroxysmal atrial fibrillation: Secondary | ICD-10-CM

## 2016-08-07 DIAGNOSIS — I4819 Other persistent atrial fibrillation: Secondary | ICD-10-CM

## 2016-08-07 DIAGNOSIS — Z7901 Long term (current) use of anticoagulants: Secondary | ICD-10-CM | POA: Diagnosis not present

## 2016-08-07 DIAGNOSIS — I481 Persistent atrial fibrillation: Secondary | ICD-10-CM | POA: Diagnosis not present

## 2016-08-07 LAB — POCT INR: INR: 3.3

## 2016-08-28 ENCOUNTER — Ambulatory Visit (INDEPENDENT_AMBULATORY_CARE_PROVIDER_SITE_OTHER): Payer: Medicare Other | Admitting: Pharmacist

## 2016-08-28 DIAGNOSIS — I480 Paroxysmal atrial fibrillation: Secondary | ICD-10-CM

## 2016-08-28 DIAGNOSIS — I481 Persistent atrial fibrillation: Secondary | ICD-10-CM | POA: Diagnosis not present

## 2016-08-28 DIAGNOSIS — Z7901 Long term (current) use of anticoagulants: Secondary | ICD-10-CM

## 2016-08-28 DIAGNOSIS — I4819 Other persistent atrial fibrillation: Secondary | ICD-10-CM

## 2016-08-28 LAB — POCT INR: INR: 3.8

## 2016-09-25 ENCOUNTER — Ambulatory Visit (INDEPENDENT_AMBULATORY_CARE_PROVIDER_SITE_OTHER): Payer: Medicare Other | Admitting: Pharmacist Clinician (PhC)/ Clinical Pharmacy Specialist

## 2016-09-25 DIAGNOSIS — I480 Paroxysmal atrial fibrillation: Secondary | ICD-10-CM | POA: Diagnosis not present

## 2016-09-25 DIAGNOSIS — I4819 Other persistent atrial fibrillation: Secondary | ICD-10-CM

## 2016-09-25 DIAGNOSIS — Z7901 Long term (current) use of anticoagulants: Secondary | ICD-10-CM

## 2016-09-25 DIAGNOSIS — I481 Persistent atrial fibrillation: Secondary | ICD-10-CM

## 2016-09-25 LAB — POCT INR: INR: 2.6

## 2016-10-09 ENCOUNTER — Other Ambulatory Visit: Payer: Self-pay | Admitting: *Deleted

## 2016-10-09 MED ORDER — FUROSEMIDE 40 MG PO TABS: 40.0000 mg | ORAL_TABLET | Freq: Every day | ORAL | 1 refills | Status: DC

## 2016-10-16 ENCOUNTER — Ambulatory Visit (INDEPENDENT_AMBULATORY_CARE_PROVIDER_SITE_OTHER): Payer: Medicare Other | Admitting: Cardiovascular Disease

## 2016-10-16 ENCOUNTER — Ambulatory Visit (INDEPENDENT_AMBULATORY_CARE_PROVIDER_SITE_OTHER): Payer: Medicare Other | Admitting: Pharmacist Clinician (PhC)/ Clinical Pharmacy Specialist

## 2016-10-16 ENCOUNTER — Encounter: Payer: Self-pay | Admitting: Cardiovascular Disease

## 2016-10-16 VITALS — BP 152/86 | HR 86 | Ht 66.0 in | Wt 170.6 lb

## 2016-10-16 DIAGNOSIS — I481 Persistent atrial fibrillation: Secondary | ICD-10-CM | POA: Diagnosis not present

## 2016-10-16 DIAGNOSIS — I5042 Chronic combined systolic (congestive) and diastolic (congestive) heart failure: Secondary | ICD-10-CM | POA: Diagnosis not present

## 2016-10-16 DIAGNOSIS — R001 Bradycardia, unspecified: Secondary | ICD-10-CM

## 2016-10-16 DIAGNOSIS — E782 Mixed hyperlipidemia: Secondary | ICD-10-CM

## 2016-10-16 DIAGNOSIS — E1151 Type 2 diabetes mellitus with diabetic peripheral angiopathy without gangrene: Secondary | ICD-10-CM | POA: Diagnosis not present

## 2016-10-16 DIAGNOSIS — Z7901 Long term (current) use of anticoagulants: Secondary | ICD-10-CM

## 2016-10-16 DIAGNOSIS — I739 Peripheral vascular disease, unspecified: Secondary | ICD-10-CM | POA: Diagnosis not present

## 2016-10-16 DIAGNOSIS — I2510 Atherosclerotic heart disease of native coronary artery without angina pectoris: Secondary | ICD-10-CM

## 2016-10-16 DIAGNOSIS — I4819 Other persistent atrial fibrillation: Secondary | ICD-10-CM

## 2016-10-16 DIAGNOSIS — J439 Emphysema, unspecified: Secondary | ICD-10-CM | POA: Diagnosis not present

## 2016-10-16 DIAGNOSIS — I480 Paroxysmal atrial fibrillation: Secondary | ICD-10-CM | POA: Diagnosis not present

## 2016-10-16 LAB — POCT INR: INR: 3.7

## 2016-10-16 NOTE — Patient Instructions (Signed)
Dr Croitoru recommends that you schedule a follow-up appointment in 6 months. You will receive a reminder letter in the mail two months in advance. If you don't receive a letter, please call our office to schedule the follow-up appointment.  If you need a refill on your cardiac medications before your next appointment, please call your pharmacy. 

## 2016-10-16 NOTE — Progress Notes (Signed)
Patient ID: Shaun Mclaughlin, male   DOB: [birth year], 82 y.o.   MRN: [id number]    Cardiology Office Note    Date:  10/16/2016   ID:  Shaun Mclaughlin, DOB 12-Sep-1934, MRN [id number]  PCP:  Gara Kroner, MD  Cardiologist:   Sanda Klein, MD   Chief Complaint  Patient presents with  . Follow-up    Persistent Afib/Chronic combined systolic and diastolic heart failure, NYHA class 2    History of Present Illness:  Shaun Mclaughlin is a 82 y.o. male with a long-standing history of atrial arrhythmia (persistent atrial fibrillation and atrial flutter requiring cardioversion in the past), peripheral arterial disease, coronary artery disease (last revascularization procedure left subclavian stent 2014), controlled type 2 diabetes mellitus and hypertension.  He is in atrial fibrillation with great ventricular rate control. He continues to work 29 hour weeks, mostly sitting at a computer. He has occasional swelling and takes furosemide once or twice a week with immediate resolution of edema. He has not had any serious bleeding but did fall on his birthday and has a large bruise on his left forearm and rib cage.  He became intolerant of sotalol due to fatigue and bradycardia. Discussed various options for arrhythmia control, including hospitalization to start dofetilide or implantation of a dual-chamber permanent pacemaker. He prefers the least aggressive approach and it's becoming more and more apparent that the arrhythmia itself is not causing much in the way of symptoms. Switched to metoprolol. He is therapeutically anticoagulated with warfarin.  He is compliant with warfarin and PT/INR checks. He does not have a history of stroke or TIA. He has coronary artery disease and previously underwent placement of a stent to the right coronary artery in 2003, coronaries patent by cardiac catheterization in 2013. He has hypertension, diabetes mellitus and hyperlipidemia all of which are well compensated. He has  peripheral arterial disease and received a stent to the distal left subclavian artery in January 2014 for left arm claudication (10 x 40 mm Cordis Smart nitinol). He is also known to have a 50-60% left renal artery stenosis. Echocardiogram and nuclear stress testing have shown reduction in LV systolic function (31% by scintigraphy, 45-50 % by transthoracic echocardiography, 35% by transesophageal echo at the time of his last cardioversion). Nuclear stress testing shows an inferior wall scar without reversible ischemia (Nov 2013).   Past Medical History:  Diagnosis Date  . Acute on chronic combined systolic and diastolic congestive heart failure, NYHA class 2 ([place]) 06/29/2014  . Arthritis    "maybe a little bit in some joints" (08/04/2012)  . Atherosclerotic renal artery stenosis, unilateral ([facility])    lleft renal artery stenosis by duplex ultrasound  . CAD (coronary artery disease),Hx RCA stenting-patent 06/2012  08/05/2012  . Chronic anticoagulation, coumadin for PAF 08/05/2012  . Chronic bronchitis ([place])    "about q yr" (08/04/2012)  . DM (diabetes mellitus) ([place]) 08/05/2012  . External bleeding hemorrhoids ~ 2003; 2008; 2013   "removed polyps and no bleeding since" (08/04/2012)  . GERD (gastroesophageal reflux disease)   . HTN (hypertension) 08/05/2012  . Hypercholesteremia   . Hyperlipidemia 08/05/2012  . Hyperthyroidism   . Kidney stone 1980's  . PAF (paroxysmal atrial fibrillation), maintaining SR 08/05/2012  . Pneumonia ~ 2008  . PVD (peripheral vascular disease) with claudication, Lt Upper ext. pain, and known 80-90%distal Lt. subclavian artery stenosis  08/05/2012  . S/P angioplasty with stent to Lt. subclavian artery -Nitrinol stent 08/04/12 08/05/2012  . Type II diabetes  mellitus ([place])    "take RX; I'm prediabetic; don't have to  check my CBG qd; keep my weight down" (08/04/2012)    Past Surgical History:  Procedure Laterality Date  . CARDIAC CATHETERIZATION  2008 and Dec 2013   patent  coronaries  . CARDIOVERSION N/A 07/13/2014   Procedure: CARDIOVERSION;  Surgeon: Sanda Klein, MD;  Location: [facility];  Service: Cardiovascular;  Laterality: N/A;  . CARDIOVERSION N/A 09/28/2014   Procedure: CARDIOVERSION;  Surgeon: Dorothy Spark, MD;  Location: [facility];  Service: Cardiovascular;  Laterality: N/A;  . CATARACT EXTRACTION W/ INTRAOCULAR LENS IMPLANT     "right eye" (08/04/2012)  . CORONARY ANGIOPLASTY WITH STENT PLACEMENT  2003   patent 12/13  . GLAUCOMA SURGERY  2000's   "right eye; had laser procedure  to lower the pressure and prevent glaucoma" (08/04/2012)  . HERNIA REPAIR  5027'X   "umbilical" (10/25/2876)  . INGUINAL HERNIA REPAIR  2000's   "right" (08/04/2012)  . KIDNEY STONE SURGERY  1980's  . LEFT HEART CATHETERIZATION WITH CORONARY ANGIOGRAM N/A 06/28/2012   Procedure: LEFT HEART CATHETERIZATION WITH CORONARY ANGIOGRAM;  Surgeon: Lorretta Harp, MD;  Location: [facility];  Service: Cardiovascular;  Laterality: N/A;  . LITHOTRIPSY  1980's   "imploded in my kidney; ended up having to be cut open in my back" (08/04/2012)  . SUBCLAVIAN STENT PLACEMENT  Jan 2014   Lt SCA  . TONSILLECTOMY  1940's  . UNILATERAL UPPER EXTREMEITY ANGIOGRAM N/A 08/04/2012   Procedure: UNILATERAL UPPER EXTREMEITY ANGIOGRAM;  Surgeon: Lorretta Harp, MD;  Location: [facility] CATH LAB;  Service: Cardiovascular;  Laterality: N/A;  . UPPER EXTREMITY ANGIOGRAM Bilateral 06/28/2012   Procedure: UPPER EXTREMITY ANGIOGRAM;  Surgeon: Lorretta Harp, MD;  Location[facility];  Service: Cardiovascular;  Laterality: Bilateral;    Current Medications: Outpatient Medications Prior to Visit  Medication Sig Dispense Refill  . albuterol (PROVENTIL HFA;VENTOLIN HFA) 108 (90 BASE) MCG/ACT inhaler Inhale 2 puffs into the lungs every 6 (six) hours as needed for wheezing or shortness of breath. 1 Inhaler 5  . Ascorbic Acid (VITAMIN C WITH ROSE HIPS) 1000 MG tablet Take 1,000 mg by mouth daily.    Marland Kitchen  atorvastatin (LIPITOR) 40 MG tablet Take 1 tablet (40 mg total) by mouth daily. 30 tablet 11  . furosemide (LASIX) 40 MG tablet Take 1 tablet (40 mg total) by mouth daily. 30 tablet 1  . KRILL OIL PO Take by mouth daily.    Marland Kitchen latanoprost (XALATAN) 0.005 % ophthalmic solution Place 1 drop into both eyes at bedtime.    Marland Kitchen lisinopril (PRINIVIL,ZESTRIL) 40 MG tablet Take 1 tablet (40 mg total) by mouth daily. 30 tablet 11  . loratadine (CLARITIN) 10 MG tablet Take 10 mg by mouth daily as needed.     . metFORMIN (GLUCOPHAGE-XR) 500 MG 24 hr tablet Take 500 mg by mouth daily with breakfast.    . Multiple Vitamin (MULTIVITAMIN WITH MINERALS) TABS Take 1 tablet by mouth daily.    Marland Kitchen omeprazole (PRILOSEC) 20 MG capsule Take 20 mg by mouth daily as needed.     . potassium chloride SA (K-DUR,KLOR-CON) 20 MEQ tablet Take 1 tablet (20 mEq total) by mouth daily as needed (take with lasix). 30 tablet 5  . propylthiouracil (PTU) 50 MG tablet Take 50 mg by mouth daily.    Marland Kitchen warfarin (COUMADIN) 5 MG tablet take 1 to 1 and 1/2 tablets by mouth as directed BY COUMADIN CLINIC 120 tablet 1  .  metoprolol tartrate (LOPRESSOR) 25 MG tablet Take 1 tablet (25 mg total) by mouth 2 (two) times daily. 60 tablet 11   No facility-administered medications prior to visit.      Allergies:   Patient has no known allergies.   Social History   Social History  . Marital status: Married    Spouse name: N/A  . Number of children: N/A  . Years of education: N/A   Social History Main Topics  . Smoking status: Current Every Day Smoker    Packs/day: 0.12    Years: 25.00    Types: Cigarettes, Pipe  . Smokeless tobacco: Never Used     Comment: 08/04/2012 "still smoke a cigar q once in awhile"  . Alcohol use Yes     Comment: 08/04/2012 "once/month I might have a drink (beer, wine, or margarita)  . Drug use: No  . Sexual activity: No   Other Topics Concern  . None   Social History Narrative  . None     Family History:  The  patient's family history includes Coronary artery disease (age of onset: 48) in his father; Hypertension in his mother; Stroke (age of onset: 71) in his mother.   ROS:   Please see the history of present illness.    ROS All other systems reviewed and are negative.   PHYSICAL EXAM:   VS:  BP (!) 152/86   Pulse 86   Ht 5\' 6"  (1.676 m)   Wt 77.4 kg (170 lb 9.6 oz)   BMI 27.54 kg/m    Rechecked BP left arm 141/81 mmHg, right arm 140/78 mmHg GEN: Well nourished, well developed, in no acute distress  HEENT: normal  Neck: no JVD, carotid bruits, or masses Cardiac: Irregular; no murmurs, rubs, or gallops,no edema  Respiratory: A few scattered rhonchi and wheezes, otherwise clear to auscultation bilaterally, normal work of breathing GI: soft, nontender, nondistended, + BS MS: no deformity or atrophy  Skin: warm and dry, no rash Neuro:  Alert and Oriented x 3, Strength and sensation are intact Psych: euthymic mood, full affect  Wt Readings from Last 3 Encounters:  10/16/16 77.4 kg (170 lb 9.6 oz)  06/26/16 75.9 kg (167 lb 6.4 oz)  03/20/16 75.5 kg (166 lb 6.4 oz)      Studies/Labs Reviewed:   EKG:  EKG is ordered today.  The ekg ordered today demonstrates atrial fibrillation With occasional wider complex beats that are probably PVCs/fusion beats, right bundle branch block and left axis deviation due to left anterior fascicular block, small inferior Q waves, QRS 136 ms , corrected QTC 500 ms  LABS: February 2017 hemoglobin A1c 6.6%, cholesterol 105, triglycerides 85, HDL 25, LDL 64 August 2017, hemoglobin A1c 6.3%, cholesterol 97, triglycerides 73, HDL 25, LDL 57, creatinine 1.1  ASSESSMENT:    1. Persistent atrial fibrillation ([place])   2. Bradycardia, sinus, persistent, severe   3. Chronic combined systolic and diastolic heart failure, NYHA class 2 ([place])   4. Coronary artery disease involving native coronary artery of native heart without angina pectoris   5. PVD - s/p LSCA stent  08/04/12   6. Mixed hyperlipidemia   7. Type 2 diabetes mellitus with diabetic peripheral angiopathy without gangrene, without long-term current use of insulin ([place])   8. Pulmonary emphysema, unspecified emphysema type ([place])   9. Chronic anticoagulation, coumadin for PAF      PLAN:  In order of problems listed above:  1. AFib: Rate control seems fair. Continue warfarin anticoagulation. CHADSVasc  6 (age 35, vascular disease, HTN, DM, CHF). Stopped sotalol due to ineffectiveness and sensitive QT prolongation. Although he has COPD, he also has coronary disease and a beta blocker will be preferable to a calcium channel blocker. 2. SSS: He is also developing evidence of infra-hissing conduction disease with bifascicular block. Pacemaker therapy has been offered for enthesis when we were still trying to maintain normal rhythm), but declined. Since we are now proceeding a rate control strategy for this probably permanent atrial fibrillation, this is less of an issue. I become a problem in the future if he continues to develop AV node disease as well. 3. CHF: Seems to be well compensated. He has not required diuretic dose adjustment recently. NYHA functional class II. He is still working full-time. 4. CAD: Denies angina pectoris with activity (much more active than the usual patient his age). 5. PAD: Asymptomatic 6. HLP: HDL remains very low, but all his other lipid parameters are well within the desirable range. I don't think he'll be able to increase his level of physical activity and he is relatively lean. 7. DM: Well controlled. A1c briefly above 7% after the holidays, now improved. 8. COPD: Probably the biggest contributor to his exertional dyspnea. If wheezing becomes a big problem, may have to switch from metoprolol to a calcium channel blocker. 9. Warfarin: Therapeutically anticoagulated today. He has not had a subtherapeutic INR since 2015 and only occasionally has excessively prolonged prothrombin  time.    Medication Adjustments/Labs and Tests Ordered: Current medicines are reviewed at length with the patient today.  Concerns regarding medicines are outlined above.  Medication changes, Labs and Tests ordered today are listed in the Patient Instructions below. Patient Instructions  Dr Sallyanne Kuster recommends that you schedule a follow-up appointment in 6 months. You will receive a reminder letter in the mail two months in advance. If you don't receive a letter, please call our office to schedule the follow-up appointment.  If you need a refill on your cardiac medications before your next appointment, please call your pharmacy.    Signed, Sanda Klein, MD  10/16/2016 9:05 AM    [facility] [address], [place], [place]  [address] Phone: [phone]; Fax: [phone]

## 2016-10-25 ENCOUNTER — Emergency Department (HOSPITAL_COMMUNITY): Payer: Medicare Other

## 2016-10-25 ENCOUNTER — Encounter (HOSPITAL_COMMUNITY): Payer: Self-pay | Admitting: *Deleted

## 2016-10-25 ENCOUNTER — Inpatient Hospital Stay (HOSPITAL_COMMUNITY)
Admission: EM | Admit: 2016-10-25 | Discharge: 2016-10-30 | DRG: 469 | Disposition: A | Discharge status: Home Health | Payer: Medicare Other | Attending: Family Medicine | Admitting: Family Medicine

## 2016-10-25 DIAGNOSIS — Z09 Encounter for follow-up examination after completed treatment for conditions other than malignant neoplasm: Secondary | ICD-10-CM

## 2016-10-25 DIAGNOSIS — I481 Persistent atrial fibrillation: Secondary | ICD-10-CM | POA: Diagnosis present

## 2016-10-25 DIAGNOSIS — I10 Essential (primary) hypertension: Secondary | ICD-10-CM | POA: Diagnosis not present

## 2016-10-25 DIAGNOSIS — S72001A Fracture of unspecified part of neck of right femur, initial encounter for closed fracture: Secondary | ICD-10-CM | POA: Diagnosis not present

## 2016-10-25 DIAGNOSIS — Z8781 Personal history of (healed) traumatic fracture: Secondary | ICD-10-CM

## 2016-10-25 DIAGNOSIS — N179 Acute kidney failure, unspecified: Secondary | ICD-10-CM | POA: Diagnosis present

## 2016-10-25 DIAGNOSIS — I2510 Atherosclerotic heart disease of native coronary artery without angina pectoris: Secondary | ICD-10-CM | POA: Diagnosis present

## 2016-10-25 DIAGNOSIS — I5042 Chronic combined systolic (congestive) and diastolic (congestive) heart failure: Secondary | ICD-10-CM | POA: Diagnosis present

## 2016-10-25 DIAGNOSIS — Z7901 Long term (current) use of anticoagulants: Secondary | ICD-10-CM

## 2016-10-25 DIAGNOSIS — E0590 Thyrotoxicosis, unspecified without thyrotoxic crisis or storm: Secondary | ICD-10-CM | POA: Diagnosis present

## 2016-10-25 DIAGNOSIS — I5043 Acute on chronic combined systolic (congestive) and diastolic (congestive) heart failure: Secondary | ICD-10-CM | POA: Diagnosis present

## 2016-10-25 DIAGNOSIS — I5032 Chronic diastolic (congestive) heart failure: Secondary | ICD-10-CM | POA: Diagnosis present

## 2016-10-25 DIAGNOSIS — K219 Gastro-esophageal reflux disease without esophagitis: Secondary | ICD-10-CM | POA: Diagnosis present

## 2016-10-25 DIAGNOSIS — D62 Acute posthemorrhagic anemia: Secondary | ICD-10-CM | POA: Diagnosis not present

## 2016-10-25 DIAGNOSIS — I701 Atherosclerosis of renal artery: Secondary | ICD-10-CM | POA: Diagnosis present

## 2016-10-25 DIAGNOSIS — Z9841 Cataract extraction status, right eye: Secondary | ICD-10-CM

## 2016-10-25 DIAGNOSIS — Z79899 Other long term (current) drug therapy: Secondary | ICD-10-CM | POA: Diagnosis not present

## 2016-10-25 DIAGNOSIS — I110 Hypertensive heart disease with heart failure: Secondary | ICD-10-CM | POA: Diagnosis present

## 2016-10-25 DIAGNOSIS — Z419 Encounter for procedure for purposes other than remedying health state, unspecified: Secondary | ICD-10-CM | POA: Diagnosis not present

## 2016-10-25 DIAGNOSIS — I150 Renovascular hypertension: Secondary | ICD-10-CM | POA: Diagnosis not present

## 2016-10-25 DIAGNOSIS — Z961 Presence of intraocular lens: Secondary | ICD-10-CM | POA: Diagnosis present

## 2016-10-25 DIAGNOSIS — M25551 Pain in right hip: Secondary | ICD-10-CM | POA: Diagnosis present

## 2016-10-25 DIAGNOSIS — Z7984 Long term (current) use of oral hypoglycemic drugs: Secondary | ICD-10-CM

## 2016-10-25 DIAGNOSIS — S72011A Unspecified intracapsular fracture of right femur, initial encounter for closed fracture: Secondary | ICD-10-CM | POA: Diagnosis present

## 2016-10-25 DIAGNOSIS — I4819 Other persistent atrial fibrillation: Secondary | ICD-10-CM | POA: Diagnosis present

## 2016-10-25 DIAGNOSIS — W19XXXA Unspecified fall, initial encounter: Secondary | ICD-10-CM | POA: Diagnosis present

## 2016-10-25 DIAGNOSIS — M1990 Unspecified osteoarthritis, unspecified site: Secondary | ICD-10-CM | POA: Diagnosis present

## 2016-10-25 DIAGNOSIS — E7800 Pure hypercholesterolemia, unspecified: Secondary | ICD-10-CM | POA: Diagnosis present

## 2016-10-25 DIAGNOSIS — F17210 Nicotine dependence, cigarettes, uncomplicated: Secondary | ICD-10-CM | POA: Diagnosis present

## 2016-10-25 DIAGNOSIS — E1151 Type 2 diabetes mellitus with diabetic peripheral angiopathy without gangrene: Secondary | ICD-10-CM | POA: Diagnosis present

## 2016-10-25 DIAGNOSIS — S72009A Fracture of unspecified part of neck of unspecified femur, initial encounter for closed fracture: Secondary | ICD-10-CM | POA: Diagnosis present

## 2016-10-25 DIAGNOSIS — E119 Type 2 diabetes mellitus without complications: Secondary | ICD-10-CM

## 2016-10-25 HISTORY — DX: Fracture of unspecified part of neck of unspecified femur, initial encounter for closed fracture: S72.009A

## 2016-10-25 LAB — BASIC METABOLIC PANEL
ANION GAP: 13 (ref 5–15)
BUN: 29 mg/dL — ABNORMAL HIGH (ref 6–20)
CALCIUM: 8.4 mg/dL — AB (ref 8.9–10.3)
CO2: 22 mmol/L (ref 22–32)
CREATININE: 1.28 mg/dL — AB (ref 0.61–1.24)
Chloride: 96 mmol/L — ABNORMAL LOW (ref 101–111)
GFR, EST AFRICAN AMERICAN: 59 mL/min — AB (ref >60)
GFR, EST NON AFRICAN AMERICAN: 51 mL/min — AB (ref >60)
Glucose, Bld: 178 mg/dL — ABNORMAL HIGH (ref 65–99)
Potassium: 4.3 mmol/L (ref 3.5–5.1)
SODIUM: 131 mmol/L — AB (ref 135–145)

## 2016-10-25 LAB — PROTIME-INR
INR: 3.04
PROTHROMBIN TIME: 32.2 s — AB (ref 11.4–15.2)

## 2016-10-25 LAB — CBC WITH DIFFERENTIAL/PLATELET
BASOS ABS: 0.3 10*3/uL — AB (ref 0.0–0.1)
BASOS PCT: 3 %
EOS PCT: 0 %
Eosinophils Absolute: 0 10*3/uL (ref 0.0–0.7)
HCT: 38.9 % — ABNORMAL LOW (ref 39.0–52.0)
HEMOGLOBIN: 13.1 g/dL (ref 13.0–17.0)
Lymphocytes Relative: 8 %
Lymphs Abs: 0.9 10*3/uL (ref 0.7–4.0)
MCH: 30.6 pg (ref 26.0–34.0)
MCHC: 33.7 g/dL (ref 30.0–36.0)
MCV: 90.9 fL (ref 78.0–100.0)
Monocytes Absolute: 0.3 10*3/uL (ref 0.1–1.0)
Monocytes Relative: 3 %
NEUTROS PCT: 86 %
Neutro Abs: 9.7 10*3/uL — ABNORMAL HIGH (ref 1.7–7.7)
Platelets: 229 10*3/uL (ref 150–400)
RBC: 4.28 MIL/uL (ref 4.22–5.81)
RDW: 16.7 % — ABNORMAL HIGH (ref 11.5–15.5)
WBC: 11.2 10*3/uL — AB (ref 4.0–10.5)

## 2016-10-25 LAB — TYPE AND SCREEN
ABO/RH(D): A POS
ANTIBODY SCREEN: NEGATIVE

## 2016-10-25 MED ORDER — SODIUM CHLORIDE 0.9 % IV SOLN
INTRAVENOUS | Status: DC
  Administered 2016-10-25: 125 mL/h via INTRAVENOUS

## 2016-10-25 MED ORDER — VITAMIN K1 10 MG/ML IJ SOLN
10.0000 mg | Freq: Once | INTRAMUSCULAR | Status: AC
  Administered 2016-10-25: 10 mg via SUBCUTANEOUS
  Filled 2016-10-25: qty 1

## 2016-10-25 MED ORDER — HYDROMORPHONE HCL 1 MG/ML IJ SOLN
0.5000 mg | INTRAMUSCULAR | Status: AC | PRN
  Administered 2016-10-25 (×2): 0.5 mg via INTRAVENOUS
  Filled 2016-10-25 (×2): qty 1

## 2016-10-25 NOTE — ED Provider Notes (Signed)
[facility] DEPT Provider Note   CSN: [id number] Arrival date & time: 10/25/16  [place]     History   Chief Complaint Chief Complaint  Patient presents with  . Fall  . Hip Pain    HPI Shaun Mclaughlin is a 82 y.o. male.  HPI Patient presents to emergency room with complaints of right hip pain. Patient states Shaun Mclaughlin fell actually Thursday evening. Shaun Mclaughlin complained of pain in his right hip and buttock area. Initially using a cane but was still able to get around and walk. Symptoms increased and Shaun Mclaughlin started using a walker. This morning however the pain became more severe. Shaun Mclaughlin's having trouble putting any weight on his leg now and when Shaun Mclaughlin stands his right foot turns out. Past Medical History:  Diagnosis Date  . Acute on chronic combined systolic and diastolic congestive heart failure, NYHA class 2 ([place]) 06/29/2014  . Arthritis    "maybe a little bit in some joints" (08/04/2012)  . Atherosclerotic renal artery stenosis, unilateral (H[facility])    lleft renal artery stenosis by duplex ultrasound  . CAD (coronary artery disease),Hx RCA stenting-patent 06/2012  08/05/2012  . Chronic anticoagulation, coumadin for PAF 08/05/2012  . Chronic bronchitis ([place])    "about q yr" (08/04/2012)  . DM (diabetes mellitus) ([place]) 08/05/2012  . External bleeding hemorrhoids ~ 2003; 2008; 2013   "removed polyps and no bleeding since" (08/04/2012)  . GERD (gastroesophageal reflux disease)   . HTN (hypertension) 08/05/2012  . Hypercholesteremia   . Hyperlipidemia 08/05/2012  . Hyperthyroidism   . Kidney stone 1980's  . PAF (paroxysmal atrial fibrillation), maintaining SR 08/05/2012  . Pneumonia ~ 2008  . PVD (peripheral vascular disease) with claudication, Lt Upper ext. pain, and known 80-90%distal Lt. subclavian artery stenosis  08/05/2012  . S/P angioplasty with stent to Lt. subclavian artery -Nitrinol stent 08/04/12 08/05/2012  . Type II diabetes mellitus ([place])    "take RX; I'm prediabetic; don't have to  check my CBG qd; keep my  weight down" (08/04/2012)    Patient Active Problem List   Diagnosis Date Noted  . COPD (chronic obstructive pulmonary disease) ([place]) 06/26/2016  . Bradycardia, drug induced 03/20/2016  . Bradycardia, sinus, persistent, severe 12/20/2015  . Renal artery stenosis ([place]) 02/01/2015  . Atrial flutter ([place]) 08/14/2014  . Chronic combined systolic and diastolic heart failure, NYHA class 2 ([place]) 08/14/2014  . Acute on chronic combined systolic and diastolic congestive heart failure, NYHA class 2 ([place]) 06/29/2014  . CAD,Hx RCA stent Jan '03. patent at cath 06/2012  08/05/2012  . Persistent atrial fibrillation ([place]) 08/05/2012  . Chronic anticoagulation, coumadin for PAF 08/05/2012  . DM (diabetes mellitus) ([place]) 08/05/2012  . Hyperlipidemia 08/05/2012  . PVD - s/p LSCA stent 08/04/12 08/05/2012  . HTN (hypertension) 08/05/2012    Past Surgical History:  Procedure Laterality Date  . CARDIAC CATHETERIZATION  2008 and Dec 2013   patent coronaries  . CARDIOVERSION N/A 07/13/2014   Procedure: CARDIOVERSION;  Surgeon: Sanda Klein, MD;  Location: [facility];  Service: Cardiovascular;  Laterality: N/A;  . CARDIOVERSION N/A 09/28/2014   Procedure: CARDIOVERSION;  Surgeon: Dorothy Spark, MD;  Location: [facility];  Service: Cardiovascular;  Laterality: N/A;  . CATARACT EXTRACTION W/ INTRAOCULAR LENS IMPLANT     "right eye" (08/04/2012)  . CORONARY ANGIOPLASTY WITH STENT PLACEMENT  2003   patent 12/13  . GLAUCOMA SURGERY  2000's   "right eye; had laser procedure  to lower the pressure and prevent glaucoma" (08/04/2012)  .  HERNIA REPAIR  1740'C   "umbilical" (07/30/4816)  . INGUINAL HERNIA REPAIR  2000's   "right" (08/04/2012)  . KIDNEY STONE SURGERY  1980's  . LEFT HEART CATHETERIZATION WITH CORONARY ANGIOGRAM N/A 06/28/2012   Procedure: LEFT HEART CATHETERIZATION WITH CORONARY ANGIOGRAM;  Surgeon: Lorretta Harp, MD;  Location: [facility] C[facility] LAB;  Service: Cardiovascular;  Laterality: N/A;  .  LITHOTRIPSY  1980's   "imploded in my kidney; ended up having to be cut open in my back" (08/04/2012)  . SUBCLAVIAN STENT PLACEMENT  Jan 2014   Lt SCA  . TONSILLECTOMY  1940's  . UNILATERAL UPPER EXTREMEITY ANGIOGRAM N/A 08/04/2012   Procedure: UNILATERAL UPPER EXTREMEITY ANGIOGRAM;  Surgeon: Lorretta Harp, MD;  Location: [facility] CATH LAB;  Service: Cardiovascular;  Laterality: N/A;  . UPPER EXTREMITY ANGIOGRAM Bilateral 06/28/2012   Procedure: UPPER EXTREMITY ANGIOGRAM;  Surgeon: Lorretta Harp, MD;  Location: [facility] CATH LAB;  Service: Cardiovascular;  Laterality: Bilateral;       Home Medications    Prior to Admission medications   Medication Sig Start Date End Date Taking? Authorizing Provider  albuterol (PROVENTIL HFA;VENTOLIN HFA) 108 (90 BASE) MCG/ACT inhaler Inhale 2 puffs into the lungs every 6 (six) hours as needed for wheezing or shortness of breath. 09/28/14  Yes Mihai Croitoru, MD  Ascorbic Acid (VITAMIN C WITH ROSE HIPS) 1000 MG tablet Take 1,000 mg by mouth daily.   Yes Historical Provider, MD  atorvastatin (LIPITOR) 40 MG tablet Take 1 tablet (40 mg total) by mouth daily. 03/20/16  Yes Mihai Croitoru, MD  Brompheniramine-Phenylephrine (RA COLD & ALLERGY) 1-2.5 MG/5ML syrup Take 5 mLs by mouth every 6 (six) hours as needed for cough.   Yes Historical Provider, MD  Capsaicin-Menthol (SALONPAS GEL EX) Apply 1 application topically daily as needed (pain).   Yes Historical Provider, MD  furosemide (LASIX) 40 MG tablet Take 1 tablet (40 mg total) by mouth daily. Patient taking differently: Take 40 mg by mouth daily as needed for edema.  10/09/16  Yes Mihai Croitoru, MD  Hypromellose (GENTEAL MILD) 0.2 % SOLN Apply 2 drops to eye every morning.   Yes Historical Provider, MD  ibuprofen (ADVIL,MOTRIN) 200 MG tablet Take 400 mg by mouth every 6 (six) hours as needed for moderate pain.   Yes Historical Provider, MD  KRILL OIL PO Take 1 capsule by mouth daily.    Yes Historical Provider, MD    latanoprost (XALATAN) 0.005 % ophthalmic solution Place 1 drop into both eyes at bedtime.   Yes Historical Provider, MD  levocetirizine (XYZAL) 5 MG tablet Take 5 mg by mouth daily as needed for allergies.   Yes Historical Provider, MD  lisinopril (PRINIVIL,ZESTRIL) 40 MG tablet Take 1 tablet (40 mg total) by mouth daily. 03/20/16  Yes Mihai Croitoru, MD  metFORMIN (GLUCOPHAGE-XR) 500 MG 24 hr tablet Take 500 mg by mouth daily with breakfast.   Yes Historical Provider, MD  metoprolol tartrate (LOPRESSOR) 25 MG tablet Take 1 tablet (25 mg total) by mouth 2 (two) times daily. 06/26/16 10/25/16 Yes Mihai Croitoru, MD  Multiple Vitamin (MULTIVITAMIN WITH MINERALS) TABS Take 1 tablet by mouth daily.   Yes Historical Provider, MD  naproxen sodium (ANAPROX) 220 MG tablet Take 220 mg by mouth 2 (two) times daily as needed (PAIN).   Yes Historical Provider, MD  omeprazole (PRILOSEC) 20 MG capsule Take 20 mg by mouth daily as needed (heartburn).    Yes Historical Provider, MD  OVER THE COUNTER MEDICATION Apply 1 application  topically daily as needed (SCARRING CREME).   Yes Historical Provider, MD  potassium chloride SA (K-DUR,KLOR-CON) 20 MEQ tablet Take 1 tablet (20 mEq total) by mouth daily as needed (take with lasix). 08/02/15  Yes Mihai Croitoru, MD  propylthiouracil (PTU) 50 MG tablet Take 50 mg by mouth daily.   Yes Historical Provider, MD  triamcinolone (NASACORT ALLERGY 24HR) 55 MCG/ACT AERO nasal inhaler Place 2 sprays into the nose daily as needed (ALLERGIES).   Yes Historical Provider, MD  trolamine salicylate (ASPERCREME) 10 % cream Apply 1 application topically as needed for muscle pain.   Yes Historical Provider, MD  warfarin (COUMADIN) 5 MG tablet take 1 to 1 and 1/2 tablets by mouth as directed BY COUMADIN CLINIC Patient taking differently: Take 5 mg by mouth daily at 6 PM.  03/20/16  Yes Sanda Klein, MD    Family History Family History  Problem Relation Age of Onset  . Stroke Mother 4  .  Hypertension Mother   . Coronary artery disease Father 19    Social History Social History  Substance Use Topics  . Smoking status: Current Every Day Smoker    Packs/day: 0.50    Years: 25.00    Types: Cigarettes, Pipe  . Smokeless tobacco: Never Used     Comment: 08/04/2012 "still smoke a cigar q once in awhile"  . Alcohol use Yes     Comment: 08/04/2012 "once/month I might have a drink (beer, wine, or margarita)     Allergies   Patient has no known allergies.   Review of Systems Review of Systems  All other systems reviewed and are negative.    Physical Exam Updated Vital Signs BP (!) 146/78 (BP Location: Left Arm)   Pulse (!) 118   Temp 98.2 F (36.8 C) (Oral)   Resp 18   Ht 5\' 6"  (1.676 m)   Wt 74.8 kg   SpO2 95%   BMI 26.63 kg/m   Physical Exam  Constitutional: Shaun Mclaughlin appears well-developed and well-nourished. No distress.  HENT:  Head: Normocephalic and atraumatic.  Right Ear: External ear normal.  Left Ear: External ear normal.  Eyes: Conjunctivae are normal. Right eye exhibits no discharge. Left eye exhibits no discharge. No scleral icterus.  Neck: Neck supple. No tracheal deviation present.  Cardiovascular: Normal rate, regular rhythm and intact distal pulses.   Pulmonary/Chest: Effort normal and breath sounds normal. No stridor. No respiratory distress. Shaun Mclaughlin has no wheezes. Shaun Mclaughlin has no rales.  Abdominal: Soft. Bowel sounds are normal. Shaun Mclaughlin exhibits no distension. There is no tenderness. There is no rebound and no guarding.  Musculoskeletal: Shaun Mclaughlin exhibits tenderness. Shaun Mclaughlin exhibits no edema.       Right hip: Shaun Mclaughlin exhibits tenderness and bony tenderness.  Right lower extremity seems to be shortened and externally rotated  Neurological: Shaun Mclaughlin is alert. Shaun Mclaughlin has normal strength. No cranial nerve deficit (no facial droop, extraocular movements intact, no slurred speech) or sensory deficit. Shaun Mclaughlin exhibits normal muscle tone. Shaun Mclaughlin displays no seizure activity. Coordination normal.  Skin:  Skin is warm and dry. No rash noted.  Psychiatric: Shaun Mclaughlin has a normal mood and affect.  Nursing note and vitals reviewed.    ED Treatments / Results  Labs (all labs ordered are listed, but only abnormal results are displayed) Labs Reviewed  CBC WITH DIFFERENTIAL/PLATELET - Abnormal; Notable for the following:       Result Value   WBC 11.2 (*)    HCT 38.9 (*)    RDW 16.7 (*)  Neutro Abs 9.7 (*)    Basophils Absolute 0.3 (*)    All other components within normal limits  PROTIME-INR - Abnormal; Notable for the following:    Prothrombin Time 32.2 (*)    All other components within normal limits  BASIC METABOLIC PANEL - Abnormal; Notable for the following:    Sodium 131 (*)    Chloride 96 (*)    Glucose, Bld 178 (*)    BUN 29 (*)    Creatinine, Ser 1.28 (*)    Calcium 8.4 (*)    GFR calc non Af Amer 51 (*)    GFR calc Af Amer 59 (*)    All other components within normal limits  TYPE AND SCREEN  ABO/RH     Radiology Dg Chest 1 View  Result Date: 10/25/2016 CLINICAL DATA:  Right hip pain status post fall. EXAM: CHEST 1 VIEW COMPARISON:  05/17/2015 FINDINGS: The cardiac silhouette remains enlarged. Aortic atherosclerosis is noted. There is a persistent small right pleural effusion with patchy right basilar parenchymal airspace opacity, similar to the prior study. The left lung is clear. No pneumothorax is seen. A left subclavian region vascular stent is noted. There is mild thoracic spondylosis. IMPRESSION: 1. Small right pleural effusion with right basilar atelectasis versus pneumonia, similar to the prior study. 2. Aortic atherosclerosis. Electronically Signed   By: Logan Bores M.D.   On: 10/25/2016 20:13   Dg Hip Unilat  With Pelvis 2-3 Views Right  Result Date: 10/25/2016 CLINICAL DATA:  Right hip pain after fall.  Initial encounter. EXAM: DG HIP (WITH OR WITHOUT PELVIS) 2-3V RIGHT COMPARISON:  None. FINDINGS: There is a comminuted subcapital femoral neck fracture on the right  which also appears to involve a portion of the inferolateral head. There is mild displacement. There is rotation of the more distal femur. The femoral head remains seated in the acetabulum. Limited evaluation of the left hip is without evidence of acute osseous abnormality. IMPRESSION: Right femoral neck fracture. Electronically Signed   By: Logan Bores M.D.   On: 10/25/2016 20:07    Procedures Procedures (including critical care time)  Medications Ordered in ED Medications  0.9 %  sodium chloride infusion (125 mL/hr Intravenous New Bag/Given 10/25/16 2026)  HYDROmorphone (DILAUDID) injection 0.5 mg (0.5 mg Intravenous Given 10/25/16 2155)     Initial Impression / Assessment and Plan / ED Course  I have reviewed the triage vital signs and the nursing notes.  Pertinent labs & imaging results that were available during my care of the patient were reviewed by me and considered in my medical decision making (see chart for details).  Clinical Course as of Oct 26 2223  Nancy Fetter Oct 25, 2016  2003 DG Hip Unilat  With Pelvis 2-3 Views Right [JK]  2149 Discussed the findings with family members.  Shaun Mclaughlin would prefer [facility] orthopedics. Other family members have been seen by doctors in that office.  [NF]  6213 Discussed with Dr Delfino Lovett.  Will give Shaun Mclaughlin 10 mg vit K.  Xray his right knee.  Will need to wait for INR to return to normal before surgery.  [JK]    Clinical Course User Index [JK] Dorie Rank, MD    Patient presents to the emergency room after a fall.  X-rays show a right hip fracture. Plan on admission to the hospital for further treatment.  Will need INR to correct prior to surgery..  Final Clinical Impressions(s) / ED Diagnoses   Final diagnoses:  Closed fracture of  right hip, initial encounter [facility])      Dorie Rank, MD 10/25/16 2225

## 2016-10-25 NOTE — ED Triage Notes (Signed)
Pt states that Thurs night he had a coughing fit and fell back, landing on his hip.  Sat he began experiencing greater hip pain and had difficulty bearing weight to R leg.  Weak pedal pulses, foot warm.  Pt is on coumadin.  No obvious bruising noted.

## 2016-10-25 NOTE — ED Notes (Signed)
Patient transported to X-ray 

## 2016-10-25 NOTE — H&P (Signed)
History and Physical    Shaun Mclaughlin EUM:[id number] DOB: Oct 14, 1934 DOA: 10/25/2016  Referring MD/NP/PA: Dr. Tomi Bamberger PCP: Gara Kroner, MD  Patient coming from: Home  Chief Complaint: Right hip pain  HPI: Shaun Mclaughlin is a 82 y.o. male with medical history significant of PAF on Coumadin , CAD, DM type II, PVD, combined systolic and diastolic CHF; who presents with right hip pain after fall. Reports falling 3 days ago while walking to bed. At that time he had gone into a coughing spell,  lost his balance, and fell landing on his right hip and knee. Thereafter his wife had to help him get up. He has had trouble putting weight on the right leg since that time. Initially tried using a cane and then walker, but still not able to bear much weight on the right hip. His wife noted that he needed assistance to get up or ambulate. He had been using over-the-counter creams and  medicines to treat pain symptoms. Associated symptoms included insomnia 2/2 pain, cough, lower extremity swelling, and decreased feeling/numbness and the right leg.   ED Course:  Admission into the emergency department patient was evaluated and seen to have a right femoral neck fracture. Patient's initial lab work revealed WBC 11.2, BUN 29, creatinine 1.28 INR 3.04 Orthopedics was consulted and recommended lowering INR. Patient was given 10 mg of vitamin K while in the ED.   Review of Systems: As per HPI otherwise 10 point review of systems negative.   Past Medical History:  Diagnosis Date  . Acute on chronic combined systolic and diastolic congestive heart failure, NYHA class 2 ([place]) 06/29/2014  . Arthritis    "maybe a little bit in some joints" (08/04/2012)  . Atherosclerotic renal artery stenosis, unilateral ([facility])    lleft renal artery stenosis by duplex ultrasound  . CAD (coronary artery disease),Hx RCA stenting-patent 06/2012  08/05/2012  . Chronic anticoagulation, coumadin for PAF 08/05/2012  . Chronic bronchitis ([place])    "about q yr" (08/04/2012)  . DM (diabetes mellitus) ([place]) 08/05/2012  . External bleeding hemorrhoids ~ 2003; 2008; 2013   "removed polyps and no bleeding since" (08/04/2012)  . GERD (gastroesophageal reflux disease)   . HTN (hypertension) 08/05/2012  . Hypercholesteremia   . Hyperlipidemia 08/05/2012  . Hyperthyroidism   . Kidney stone 1980's  . PAF (paroxysmal atrial fibrillation), maintaining SR 08/05/2012  . Pneumonia ~ 2008  . PVD (peripheral vascular disease) with claudication, Lt Upper ext. pain, and known 80-90%distal Lt. subclavian artery stenosis  08/05/2012  . S/P angioplasty with stent to Lt. subclavian artery -Nitrinol stent 08/04/12 08/05/2012  . Type II diabetes mellitus ([place])    "take RX; I'm prediabetic; don't have to  check my CBG qd; keep my weight down" (08/04/2012)    Past Surgical History:  Procedure Laterality Date  . CARDIAC CATHETERIZATION  2008 and Dec 2013   patent coronaries  . CARDIOVERSION N/A 07/13/2014   Procedure: CARDIOVERSION;  Surgeon: Sanda Klein, MD;  Location: [facility];  Service: Cardiovascular;  Laterality: N/A;  . CARDIOVERSION N/A 09/28/2014   Procedure: CARDIOVERSION;  Surgeon: Dorothy Spark, MD;  Location: [facility];  Service: Cardiovascular;  Laterality: N/A;  . CATARACT EXTRACTION W/ INTRAOCULAR LENS IMPLANT     "right eye" (08/04/2012)  . CORONARY ANGIOPLASTY WITH STENT PLACEMENT  2003   patent 12/13  . GLAUCOMA SURGERY  2000's   "right eye; had laser procedure  to lower the pressure and prevent glaucoma" (08/04/2012)  . HERNIA REPAIR  [address]   "umbilical" (10/30/2701)  . INGUINAL HERNIA REPAIR  2000's   "right" (08/04/2012)  . KIDNEY STONE SURGERY  1980's  . LEFT HEART CATHETERIZATION WITH CORONARY ANGIOGRAM N/A 06/28/2012   Procedure: LEFT HEART CATHETERIZATION WITH CORONARY ANGIOGRAM;  Surgeon: Lorretta Harp, MD;  Location: [facility] CATH LAB;  Service: Cardiovascular;  Laterality: N/A;  . LITHOTRIPSY  1980's   "imploded in my kidney; ended  up having to be cut open in my back" (08/04/2012)  . SUBCLAVIAN STENT PLACEMENT  Jan 2014   Lt SCA  . TONSILLECTOMY  1940's  . UNILATERAL UPPER EXTREMEITY ANGIOGRAM N/A 08/04/2012   Procedure: UNILATERAL UPPER EXTREMEITY ANGIOGRAM;  Surgeon: Lorretta Harp, MD;  Location: [facility] CATH LAB;  Service: Cardiovascular;  Laterality: N/A;  . UPPER EXTREMITY ANGIOGRAM Bilateral 06/28/2012   Procedure: UPPER EXTREMITY ANGIOGRAM;  Surgeon: Lorretta Harp, MD;  [facility];  Service: Cardiovascular;  Laterality: Bilateral;     reports that he has been smoking Cigarettes and Pipe.  He has a 12.50 pack-year smoking history. He has never used smokeless tobacco. He reports that he drinks alcohol. He reports that he does not use drugs.  No Known Allergies  Family History  Problem Relation Age of Onset  . Stroke Mother 107  . Hypertension Mother   . Coronary artery disease Father 3    Prior to Admission medications   Medication Sig Start Date End Date Taking? Authorizing Provider  albuterol (PROVENTIL HFA;VENTOLIN HFA) 108 (90 BASE) MCG/ACT inhaler Inhale 2 puffs into the lungs every 6 (six) hours as needed for wheezing or shortness of breath. 09/28/14  Yes Mihai Croitoru, MD  Ascorbic Acid (VITAMIN C WITH ROSE HIPS) 1000 MG tablet Take 1,000 mg by mouth daily.   Yes Historical Provider, MD  atorvastatin (LIPITOR) 40 MG tablet Take 1 tablet (40 mg total) by mouth daily. 03/20/16  Yes Mihai Croitoru, MD  Brompheniramine-Phenylephrine (RA COLD & ALLERGY) 1-2.5 MG/5ML syrup Take 5 mLs by mouth every 6 (six) hours as needed for cough.   Yes Historical Provider, MD  Capsaicin-Menthol (SALONPAS GEL EX) Apply 1 application topically daily as needed (pain).   Yes Historical Provider, MD  furosemide (LASIX) 40 MG tablet Take 1 tablet (40 mg total) by mouth daily. Patient taking differently: Take 40 mg by mouth daily as needed for edema.  10/09/16  Yes Mihai Croitoru, MD  Hypromellose (GENTEAL MILD) 0.2 % SOLN  Apply 2 drops to eye every morning.   Yes Historical Provider, MD  ibuprofen (ADVIL,MOTRIN) 200 MG tablet Take 400 mg by mouth every 6 (six) hours as needed for moderate pain.   Yes Historical Provider, MD  KRILL OIL PO Take 1 capsule by mouth daily.    Yes Historical Provider, MD  latanoprost (XALATAN) 0.005 % ophthalmic solution Place 1 drop into both eyes at bedtime.   Yes Historical Provider, MD  levocetirizine (XYZAL) 5 MG tablet Take 5 mg by mouth daily as needed for allergies.   Yes Historical Provider, MD  lisinopril (PRINIVIL,ZESTRIL) 40 MG tablet Take 1 tablet (40 mg total) by mouth daily. 03/20/16  Yes Mihai Croitoru, MD  metFORMIN (GLUCOPHAGE-XR) 500 MG 24 hr tablet Take 500 mg by mouth daily with breakfast.   Yes Historical Provider, MD  metoprolol tartrate (LOPRESSOR) 25 MG tablet Take 1 tablet (25 mg total) by mouth 2 (two) times daily. 06/26/16 10/25/16 Yes Mihai Croitoru, MD  Multiple Vitamin (MULTIVITAMIN WITH MINERALS) TABS Take 1 tablet by mouth daily.  Yes Historical Provider, MD  naproxen sodium (ANAPROX) 220 MG tablet Take 220 mg by mouth 2 (two) times daily as needed (PAIN).   Yes Historical Provider, MD  omeprazole (PRILOSEC) 20 MG capsule Take 20 mg by mouth daily as needed (heartburn).    Yes Historical Provider, MD  OVER THE COUNTER MEDICATION Apply 1 application topically daily as needed (SCARRING CREME).   Yes Historical Provider, MD  potassium chloride SA (K-DUR,KLOR-CON) 20 MEQ tablet Take 1 tablet (20 mEq total) by mouth daily as needed (take with lasix). 08/02/15  Yes Mihai Croitoru, MD  propylthiouracil (PTU) 50 MG tablet Take 50 mg by mouth daily.   Yes Historical Provider, MD  triamcinolone (NASACORT ALLERGY 24HR) 55 MCG/ACT AERO nasal inhaler Place 2 sprays into the nose daily as needed (ALLERGIES).   Yes Historical Provider, MD  trolamine salicylate (ASPERCREME) 10 % cream Apply 1 application topically as needed for muscle pain.   Yes Historical Provider, MD    warfarin (COUMADIN) 5 MG tablet take 1 to 1 and 1/2 tablets by mouth as directed BY COUMADIN CLINIC Patient taking differently: Take 5 mg by mouth daily at 6 PM.  03/20/16  Yes Sanda Klein, MD    Physical Exam:    Constitutional: Elderly male in moderate discomfort Vitals:   10/25/16 2100 10/25/16 2157 10/25/16 2200 10/25/16 2230  BP: (!) 149/82 (!) 146/78 (!) 146/75 (!) 143/80  Pulse: (!) 106 (!) 118 (!) 110 (!) 106  Resp:  18    Temp:      TempSrc:      SpO2: 93% 95% 93% 92%  Weight:      Height:       Eyes: PERRL, lids and conjunctivae normal ENMT: Mucous membranes are moist. Posterior pharynx clear of any exudate or lesions.Normal dentition.  Neck: normal, supple, no masses, no thyromegaly Respiratory: clear to auscultation bilaterally, no wheezing, no crackles. Normal respiratory effort. No accessory muscle use.  Cardiovascular: Tachycardic, no murmurs / rubs / gallops. +1 lower extremity pitting edema. 2+ pedal pulses. No carotid bruits.  Abdomen: no tenderness, no masses palpated. No hepatosplenomegaly. Bowel sounds positive.  Musculoskeletal: no clubbing / cyanosis. Right leg is externally rotated and shortened. Tenderness to palpation noted of the knee and of the right hip. Skin: no rashes, lesions, ulcers. No induration Neurologic: CN 2-12 grossly intact. Sensation intact, DTR normal. Strength 5/5 in all 4.  Psychiatric: Normal judgment and insight. Alert and oriented x 3. Normal mood.     Labs on Admission: I have personally reviewed following labs and imaging studies  CBC:  Recent Labs Lab 10/25/16 1858  WBC 11.2*  NEUTROABS 9.7*  HGB 13.1  HCT 38.9*  MCV 90.9  PLT 010   Basic Metabolic Panel:  Recent Labs Lab 10/25/16 2018  NA 131*  K 4.3  CL 96*  CO2 22  GLUCOSE 178*  BUN 29*  CREATININE 1.28*  CALCIUM 8.4*   GFR: Estimated Creatinine Clearance: 41.5 mL/min (A) (by C-G formula based on SCr of 1.28 mg/dL (H)). Liver Function Tests: No  results for input(s): AST, ALT, ALKPHOS, BILITOT, PROT, ALBUMIN in the last 168 hours. No results for input(s): LIPASE, AMYLASE in the last 168 hours. No results for input(s): AMMONIA in the last 168 hours. Coagulation Profile:  Recent Labs Lab 10/25/16 1858  INR 3.04   Cardiac Enzymes: No results for input(s): CKTOTAL, CKMB, CKMBINDEX, TROPONINI in the last 168 hours. BNP (last 3 results) No results for input(s): PROBNP in the last  8760 hours. HbA1C: No results for input(s): HGBA1C in the last 72 hours. CBG: No results for input(s): GLUCAP in the last 168 hours. Lipid Profile: No results for input(s): CHOL, HDL, LDLCALC, TRIG, CHOLHDL, LDLDIRECT in the last 72 hours. Thyroid Function Tests: No results for input(s): TSH, T4TOTAL, FREET4, T3FREE, THYROIDAB in the last 72 hours. Anemia Panel: No results for input(s): VITAMINB12, FOLATE, FERRITIN, TIBC, IRON, RETICCTPCT in the last 72 hours. Urine analysis: No results found for: COLORURINE, APPEARANCEUR, LABSPEC, PHURINE, GLUCOSEU, HGBUR, BILIRUBINUR, KETONESUR, PROTEINUR, UROBILINOGEN, NITRITE, LEUKOCYTESUR Sepsis Labs: No results found for this or any previous visit (from the past 240 hour(s)).   Radiological Exams on Admission: Dg Chest 1 View  Result Date: 10/25/2016 CLINICAL DATA:  Right hip pain status post fall. EXAM: CHEST 1 VIEW COMPARISON:  05/17/2015 FINDINGS: The cardiac silhouette remains enlarged. Aortic atherosclerosis is noted. There is a persistent small right pleural effusion with patchy right basilar parenchymal airspace opacity, similar to the prior study. The left lung is clear. No pneumothorax is seen. A left subclavian region vascular stent is noted. There is mild thoracic spondylosis. IMPRESSION: 1. Small right pleural effusion with right basilar atelectasis versus pneumonia, similar to the prior study. 2. Aortic atherosclerosis. Electronically Signed   By: Logan Bores M.D.   On: 10/25/2016 20:13   Dg Knee 2  Views Right  Result Date: 10/25/2016 CLINICAL DATA:  Right knee pain.  History recent right hip fracture. EXAM: RIGHT KNEE - 1-2 VIEW COMPARISON:  None. FINDINGS: Trace joint effusion. No fracture or malalignment of the right knee. Spurring off the upper pole of the patella. Popliteal arteriosclerosis is noted. Mild osteopenia of the visualized fibula. IMPRESSION: Trace joint effusion. No acute osseous abnormality of the right knee. Electronically Signed   By: Ashley Royalty M.D.   On: 10/25/2016 22:59   Dg Hip Unilat  With Pelvis 2-3 Views Right  Result Date: 10/25/2016 CLINICAL DATA:  Right hip pain after fall.  Initial encounter. EXAM: DG HIP (WITH OR WITHOUT PELVIS) 2-3V RIGHT COMPARISON:  None. FINDINGS: There is a comminuted subcapital femoral neck fracture on the right which also appears to involve a portion of the inferolateral head. There is mild displacement. There is rotation of the more distal femur. The femoral head remains seated in the acetabulum. Limited evaluation of the left hip is without evidence of acute osseous abnormality. IMPRESSION: Right femoral neck fracture. Electronically Signed   By: Logan Bores M.D.   On: 10/25/2016 20:07      Assessment/Plan  Hip fracture, Right 2/2 fall: Acute. Imaging studies reveal a right femoral neck fracture. - Admit to telemetry bed - Hip fracture order set initiated - Pain control - Dr. Lyla Glassing  of orthopedics consulted and will have patient seen in a.m.  Leukocytosis: WBC elevated at 11.2 - Continue to monitor  Paroxysmal atrial fibrillation on chronic anticoagulation therapy: Patient found to have INR of 3.04 on admission. Given 10 mg of vitamin K IV in the ED. For likely surgical enough position of the right hip - Hold Coumadin - Follow-up repeat PT/INR in a.m.   H/O Combined systolic and diastolic CHF: Last EF noted to be 30-35% 2015. Patient will likely need to be on the dry side prior to any surgical procedure. - Added on BNP -  Strict ins and outs  - Will consider need of IV Lasix  Acute kidney injury: Patient's baseline creatinine previously noted to be within normal limits at 0.86 back in 2016, but presents  with a creatinine 1.28 and BUN of 29. - Recheck BMP in a.m.  Essential HTN - Continue lisinopril and metoprolol  Diabetes mellitus type 2 - Glycemic protocol - Hold metformin  - CBGs with Sensitive SSI  Hyperthyroidism - Check free T3  - Continue PTU  Hyperlipidemia - Continue atorvastatin      DVT prophylaxis: SCD Code Status: Full Family Communication: Discussed plan of care with patient family present at bedside Disposition Plan: Likely will need rehabilitation at discharge  Consults called: Orthopedics Admission status: Inpatient  Norval Morton MD [facility] Pager [phone]  If 7PM-7AM, please contact night-coverage [email or web address] Password [facility]  10/25/2016, 11:57 PM

## 2016-10-26 ENCOUNTER — Encounter (HOSPITAL_COMMUNITY): Payer: Self-pay

## 2016-10-26 DIAGNOSIS — E0590 Thyrotoxicosis, unspecified without thyrotoxic crisis or storm: Secondary | ICD-10-CM | POA: Diagnosis present

## 2016-10-26 DIAGNOSIS — Z8781 Personal history of (healed) traumatic fracture: Secondary | ICD-10-CM

## 2016-10-26 LAB — BASIC METABOLIC PANEL
ANION GAP: 13 (ref 5–15)
BUN: 29 mg/dL — ABNORMAL HIGH (ref 6–20)
CALCIUM: 8.5 mg/dL — AB (ref 8.9–10.3)
CO2: 25 mmol/L (ref 22–32)
Chloride: 97 mmol/L — ABNORMAL LOW (ref 101–111)
Creatinine, Ser: 1.17 mg/dL (ref 0.61–1.24)
GFR calc non Af Amer: 57 mL/min — ABNORMAL LOW (ref >60)
Glucose, Bld: 161 mg/dL — ABNORMAL HIGH (ref 65–99)
POTASSIUM: 4.2 mmol/L (ref 3.5–5.1)
Sodium: 135 mmol/L (ref 135–145)

## 2016-10-26 LAB — CBC
HEMATOCRIT: 36.6 % — AB (ref 39.0–52.0)
HEMOGLOBIN: 12 g/dL — AB (ref 13.0–17.0)
MCH: 29.8 pg (ref 26.0–34.0)
MCHC: 32.8 g/dL (ref 30.0–36.0)
MCV: 90.8 fL (ref 78.0–100.0)
Platelets: 203 10*3/uL (ref 150–400)
RBC: 4.03 MIL/uL — AB (ref 4.22–5.81)
RDW: 16.8 % — ABNORMAL HIGH (ref 11.5–15.5)
WBC: 7.5 10*3/uL (ref 4.0–10.5)

## 2016-10-26 LAB — BRAIN NATRIURETIC PEPTIDE: B Natriuretic Peptide: 432.1 pg/mL — ABNORMAL HIGH (ref 0.0–100.0)

## 2016-10-26 LAB — PROTIME-INR
INR: 3.16
PROTHROMBIN TIME: 33.2 s — AB (ref 11.4–15.2)

## 2016-10-26 LAB — GLUCOSE, CAPILLARY
GLUCOSE-CAPILLARY: 112 mg/dL — AB (ref 65–99)
Glucose-Capillary: 147 mg/dL — ABNORMAL HIGH (ref 65–99)
Glucose-Capillary: 20 mg/dL — CL (ref 65–99)
Glucose-Capillary: 200 mg/dL — ABNORMAL HIGH (ref 65–99)

## 2016-10-26 LAB — CBG MONITORING, ED: GLUCOSE-CAPILLARY: 147 mg/dL — AB (ref 65–99)

## 2016-10-26 LAB — ABO/RH: ABO/RH(D): A POS

## 2016-10-26 LAB — TSH: TSH: 1.242 u[IU]/mL (ref 0.350–4.500)

## 2016-10-26 LAB — T4, FREE: Free T4: 1.35 ng/dL — ABNORMAL HIGH (ref 0.61–1.12)

## 2016-10-26 MED ORDER — NICOTINE 21 MG/24HR TD PT24
21.0000 mg | MEDICATED_PATCH | Freq: Every day | TRANSDERMAL | Status: DC
  Administered 2016-10-27: 21 mg via TRANSDERMAL
  Filled 2016-10-26: qty 1

## 2016-10-26 MED ORDER — FLUTICASONE PROPIONATE 50 MCG/ACT NA SUSP
1.0000 | Freq: Every day | NASAL | Status: DC | PRN
Start: 2016-10-25 — End: 2016-10-30
  Administered 2016-10-27: 1 via NASAL
  Filled 2016-10-26 (×2): qty 16

## 2016-10-26 MED ORDER — LATANOPROST 0.005 % OP SOLN
1.0000 [drp] | Freq: Every day | OPHTHALMIC | Status: DC
Start: 2016-10-26 — End: 2016-10-30
  Administered 2016-10-27 – 2016-10-29 (×3): 1 [drp] via OPHTHALMIC
  Filled 2016-10-26 (×2): qty 2.5

## 2016-10-26 MED ORDER — IPRATROPIUM-ALBUTEROL 0.5-2.5 (3) MG/3ML IN SOLN: 3.0000 mL | RESPIRATORY_TRACT | Status: DC | PRN

## 2016-10-26 MED ORDER — PANTOPRAZOLE SODIUM 40 MG PO TBEC
40.0000 mg | DELAYED_RELEASE_TABLET | Freq: Every day | ORAL | Status: DC
  Administered 2016-10-26 – 2016-10-30 (×5): 40 mg via ORAL
  Filled 2016-10-26 (×5): qty 1

## 2016-10-26 MED ORDER — ATORVASTATIN CALCIUM 40 MG PO TABS
40.0000 mg | ORAL_TABLET | Freq: Every day | ORAL | Status: DC
  Administered 2016-10-26 – 2016-10-30 (×5): 40 mg via ORAL
  Filled 2016-10-26 (×6): qty 1

## 2016-10-26 MED ORDER — INSULIN ASPART 100 UNIT/ML ~~LOC~~ SOLN
0.0000 [IU] | Freq: Every day | SUBCUTANEOUS | Status: DC
  Administered 2016-10-28: 2 [IU] via SUBCUTANEOUS

## 2016-10-26 MED ORDER — INSULIN ASPART 100 UNIT/ML ~~LOC~~ SOLN
0.0000 [IU] | Freq: Three times a day (TID) | SUBCUTANEOUS | Status: DC
Start: 2016-10-26 — End: 2016-10-30
  Administered 2016-10-27 (×2): 1 [IU] via SUBCUTANEOUS
  Administered 2016-10-28 (×2): 2 [IU] via SUBCUTANEOUS
  Administered 2016-10-28: 3 [IU] via SUBCUTANEOUS
  Administered 2016-10-29: 2 [IU] via SUBCUTANEOUS
  Administered 2016-10-29 – 2016-10-30 (×3): 1 [IU] via SUBCUTANEOUS
  Administered 2016-10-30: 2 [IU] via SUBCUTANEOUS

## 2016-10-26 MED ORDER — MORPHINE SULFATE (PF) 4 MG/ML IV SOLN
0.5000 mg | INTRAVENOUS | Status: DC | PRN
  Administered 2016-10-26 – 2016-10-28 (×4): 0.52 mg via INTRAVENOUS
  Filled 2016-10-26 (×4): qty 1

## 2016-10-26 MED ORDER — LISINOPRIL 40 MG PO TABS
40.0000 mg | ORAL_TABLET | Freq: Every day | ORAL | Status: DC
  Administered 2016-10-26 – 2016-10-28 (×3): 40 mg via ORAL
  Filled 2016-10-26 (×3): qty 1
  Filled 2016-10-26: qty 2

## 2016-10-26 MED ORDER — METOPROLOL TARTRATE 25 MG PO TABS
25.0000 mg | ORAL_TABLET | Freq: Two times a day (BID) | ORAL | Status: DC
  Administered 2016-10-26 – 2016-10-28 (×6): 25 mg via ORAL
  Filled 2016-10-26 (×7): qty 1

## 2016-10-26 MED ORDER — HYPROMELLOSE (GONIOSCOPIC) 2.5 % OP SOLN
2.0000 [drp] | Freq: Every day | OPHTHALMIC | Status: DC
  Administered 2016-10-26 – 2016-10-28 (×3): 2 [drp] via OPHTHALMIC
  Filled 2016-10-26: qty 15

## 2016-10-26 MED ORDER — LORATADINE 10 MG PO TABS: 10.0000 mg | ORAL_TABLET | Freq: Every day | ORAL | Status: DC | PRN

## 2016-10-26 MED ORDER — HYDROCODONE-ACETAMINOPHEN 5-325 MG PO TABS
1.0000 | ORAL_TABLET | Freq: Four times a day (QID) | ORAL | Status: DC | PRN
  Administered 2016-10-26 – 2016-10-27 (×3): 1 via ORAL
  Administered 2016-10-27 – 2016-10-29 (×5): 2 via ORAL
  Administered 2016-10-29: 1 via ORAL
  Administered 2016-10-30: 2 via ORAL
  Filled 2016-10-26: qty 2
  Filled 2016-10-26: qty 1
  Filled 2016-10-26: qty 2
  Filled 2016-10-26 (×2): qty 1
  Filled 2016-10-26: qty 2
  Filled 2016-10-26: qty 1
  Filled 2016-10-26 (×3): qty 2

## 2016-10-26 MED ORDER — MUSCLE RUB 10-15 % EX CREA: 1.0000 "application " | TOPICAL_CREAM | CUTANEOUS | Status: DC | PRN

## 2016-10-26 MED ORDER — FUROSEMIDE 10 MG/ML IJ SOLN
40.0000 mg | Freq: Once | INTRAMUSCULAR | Status: AC
  Administered 2016-10-26: 40 mg via INTRAVENOUS
  Filled 2016-10-26: qty 4

## 2016-10-26 MED ORDER — NICOTINE 21 MG/24HR TD PT24
21.0000 mg | MEDICATED_PATCH | Freq: Every day | TRANSDERMAL | Status: DC
  Administered 2016-10-26: 21 mg via TRANSDERMAL
  Filled 2016-10-26: qty 1

## 2016-10-26 MED ORDER — PROPYLTHIOURACIL 50 MG PO TABS
50.0000 mg | ORAL_TABLET | Freq: Every day | ORAL | Status: DC
Start: 2016-10-26 — End: 2016-10-30
  Administered 2016-10-26 – 2016-10-30 (×5): 50 mg via ORAL
  Filled 2016-10-26 (×6): qty 1

## 2016-10-26 NOTE — Progress Notes (Signed)
ORTHO  Consult received for R femoral neck fracture. On chronic coumadin; vit K given in ED. Will need surgery when INR corrected. Hold coumadin. OK to give diet. Full consult to follow.

## 2016-10-26 NOTE — Consult Note (Signed)
ORTHOPAEDIC CONSULTATION  REQUESTING PHYSICIAN: Shon Millet*  PCP:  Gara Kroner, MD  Chief Complaint: Right hip pain  HPI: Shaun Mclaughlin is a 82 y.o. male who complains of  right hip pain after falling and injuring his right hip on 10/22/2016. He has been dependent on a walker since his injury. Normally he does not use any assist devices. He works 4 days per week. He is very active. He takes Coumadin for atrial fibrillation. He denies any additional injuries.  Past Medical History:  Diagnosis Date  . Acute on chronic combined systolic and diastolic congestive heart failure, NYHA class 2 ([place]) 06/29/2014  . Arthritis    "maybe a little bit in some joints" (08/04/2012)  . Atherosclerotic renal artery stenosis, unilateral (HCC)    lleft renal artery stenosis by duplex ultrasound  . CAD (coronary artery disease),Hx RCA stenting-patent 06/2012  08/05/2012  . Chronic anticoagulation, coumadin for PAF 08/05/2012  . Chronic bronchitis ([place])    "about q yr" (08/04/2012)  . DM (diabetes mellitus) ([place]) 08/05/2012  . External bleeding hemorrhoids ~ 2003; 2008; 2013   "removed polyps and no bleeding since" (08/04/2012)  . GERD (gastroesophageal reflux disease)   . Hip fracture ([place]) 10/2016   RIGHT FEMORAL NECK   . HTN (hypertension) 08/05/2012  . Hypercholesteremia   . Hyperlipidemia 08/05/2012  . Hyperthyroidism   . Kidney stone 1980's  . PAF (paroxysmal atrial fibrillation), maintaining SR 08/05/2012  . Pneumonia ~ 2008  . PVD (peripheral vascular disease) with claudication, Lt Upper ext. pain, and known 80-90%distal Lt. subclavian artery stenosis  08/05/2012  . S/P angioplasty with stent to Lt. subclavian artery -Nitrinol stent 08/04/12 08/05/2012  . Type II diabetes mellitus ([place])    "take RX; I'm prediabetic; don't have to  check my CBG qd; keep my weight down" (08/04/2012)   Past Surgical History:  Procedure Laterality Date  . CARDIAC CATHETERIZATION  2008 and Dec 2013   patent coronaries  . CARDIOVERSION N/A 07/13/2014   Procedure: CARDIOVERSION;  Surgeon: Sanda Klein, MD;  Location: [facility];  Service: Cardiovascular;  Laterality: N/A;  . CARDIOVERSION N/A 09/28/2014   Procedure: CARDIOVERSION;  Surgeon: Dorothy Spark, MD;  Location: [facility];  Service: Cardiovascular;  Laterality: N/A;  . CATARACT EXTRACTION W/ INTRAOCULAR LENS IMPLANT     "right eye" (08/04/2012)  . CORONARY ANGIOPLASTY WITH STENT PLACEMENT  2003   patent 12/13  . GLAUCOMA SURGERY  2000's   "right eye; had laser procedure  to lower the pressure and prevent glaucoma" (08/04/2012)  . HERNIA REPAIR  6761'P   "umbilical" (5/0/9326)  . INGUINAL HERNIA REPAIR  2000's   "right" (08/04/2012)  . KIDNEY STONE SURGERY  1980's  . LEFT HEART CATHETERIZATION WITH CORONARY ANGIOGRAM N/A 06/28/2012   Procedure: LEFT HEART CATHETERIZATION WITH CORONARY ANGIOGRAM;  Surgeon: Lorretta Harp, MD;  Location: [facility];  Service: Cardiovascular;  Laterality: N/A;  . LITHOTRIPSY  1980's   "imploded in my kidney; ended up having to be cut open in my back" (08/04/2012)  . SKIN CANCER EXCISION  2017 & 2018  . SUBCLAVIAN STENT PLACEMENT  Jan 2014   Lt SCA  . TONSILLECTOMY  1940's  . UNILATERAL UPPER EXTREMEITY ANGIOGRAM N/A 08/04/2012   Procedure: UNILATERAL UPPER EXTREMEITY ANGIOGRAM;  Surgeon: Lorretta Harp, MD;  Location: [facility];  Service: Cardiovascular;  Laterality: N/A;  . UPPER EXTREMITY ANGIOGRAM Bilateral 06/28/2012   Procedure: UPPER EXTREMITY ANGIOGRAM;  Surgeon: Lorretta Harp, MD;  Location: [facility];  Service: Cardiovascular;  Laterality: Bilateral;   Social History   Social History  . Marital status: Married    Spouse name: N/A  . Number of children: N/A  . Years of education: N/A   Social History Main Topics  . Smoking status: Current Every Day Smoker    Packs/day: 0.50    Years: 25.00    Types: Cigarettes, Pipe  . Smokeless tobacco: Never Used     Comment:  08/04/2012 "still smoke a cigar q once in awhile"  . Alcohol use Yes     Comment: OCCASIONAL  . Drug use: No  . Sexual activity: No   Other Topics Concern  . None   Social History Narrative  . None   Family History  Problem Relation Age of Onset  . Stroke Mother 41  . Hypertension Mother   . Coronary artery disease Father 41   No Known Allergies Prior to Admission medications   Medication Sig Start Date End Date Taking? Authorizing Provider  albuterol (PROVENTIL HFA;VENTOLIN HFA) 108 (90 BASE) MCG/ACT inhaler Inhale 2 puffs into the lungs every 6 (six) hours as needed for wheezing or shortness of breath. 09/28/14  Yes Mihai Croitoru, MD  Ascorbic Acid (VITAMIN C WITH ROSE HIPS) 1000 MG tablet Take 1,000 mg by mouth daily.   Yes Historical Provider, MD  atorvastatin (LIPITOR) 40 MG tablet Take 1 tablet (40 mg total) by mouth daily. 03/20/16  Yes Mihai Croitoru, MD  Brompheniramine-Phenylephrine (RA COLD & ALLERGY) 1-2.5 MG/5ML syrup Take 5 mLs by mouth every 6 (six) hours as needed for cough.   Yes Historical Provider, MD  Capsaicin-Menthol (SALONPAS GEL EX) Apply 1 application topically daily as needed (pain).   Yes Historical Provider, MD  furosemide (LASIX) 40 MG tablet Take 1 tablet (40 mg total) by mouth daily. Patient taking differently: Take 40 mg by mouth daily as needed for edema.  10/09/16  Yes Mihai Croitoru, MD  Hypromellose (GENTEAL MILD) 0.2 % SOLN Apply 2 drops to eye every morning.   Yes Historical Provider, MD  ibuprofen (ADVIL,MOTRIN) 200 MG tablet Take 400 mg by mouth every 6 (six) hours as needed for moderate pain.   Yes Historical Provider, MD  KRILL OIL PO Take 1 capsule by mouth daily.    Yes Historical Provider, MD  latanoprost (XALATAN) 0.005 % ophthalmic solution Place 1 drop into both eyes at bedtime.   Yes Historical Provider, MD  levocetirizine (XYZAL) 5 MG tablet Take 5 mg by mouth daily as needed for allergies.   Yes Historical Provider, MD  lisinopril  (PRINIVIL,ZESTRIL) 40 MG tablet Take 1 tablet (40 mg total) by mouth daily. 03/20/16  Yes Mihai Croitoru, MD  metFORMIN (GLUCOPHAGE-XR) 500 MG 24 hr tablet Take 500 mg by mouth daily with breakfast.   Yes Historical Provider, MD  metoprolol tartrate (LOPRESSOR) 25 MG tablet Take 1 tablet (25 mg total) by mouth 2 (two) times daily. 06/26/16 10/25/16 Yes Mihai Croitoru, MD  Multiple Vitamin (MULTIVITAMIN WITH MINERALS) TABS Take 1 tablet by mouth daily.   Yes Historical Provider, MD  naproxen sodium (ANAPROX) 220 MG tablet Take 220 mg by mouth 2 (two) times daily as needed (PAIN).   Yes Historical Provider, MD  omeprazole (PRILOSEC) 20 MG capsule Take 20 mg by mouth daily as needed (heartburn).    Yes Historical Provider, MD  OVER THE COUNTER MEDICATION Apply 1 application topically daily as needed (SCARRING CREME).   Yes Historical Provider, MD  potassium chloride  SA (K-DUR,KLOR-CON) 20 MEQ tablet Take 1 tablet (20 mEq total) by mouth daily as needed (take with lasix). 08/02/15  Yes Mihai Croitoru, MD  propylthiouracil (PTU) 50 MG tablet Take 50 mg by mouth daily.   Yes Historical Provider, MD  triamcinolone (NASACORT ALLERGY 24HR) 55 MCG/ACT AERO nasal inhaler Place 2 sprays into the nose daily as needed (ALLERGIES).   Yes Historical Provider, MD  trolamine salicylate (ASPERCREME) 10 % cream Apply 1 application topically as needed for muscle pain.   Yes Historical Provider, MD  warfarin (COUMADIN) 5 MG tablet take 1 to 1 and 1/2 tablets by mouth as directed BY COUMADIN CLINIC Patient taking differently: Take 5 mg by mouth daily at 6 PM.  03/20/16  Yes Sanda Klein, MD   Dg Chest 1 View  Result Date: 10/25/2016 CLINICAL DATA:  Right hip pain status post fall. EXAM: CHEST 1 VIEW COMPARISON:  05/17/2015 FINDINGS: The cardiac silhouette remains enlarged. Aortic atherosclerosis is noted. There is a persistent small right pleural effusion with patchy right basilar parenchymal airspace opacity, similar to the  prior study. The left lung is clear. No pneumothorax is seen. A left subclavian region vascular stent is noted. There is mild thoracic spondylosis. IMPRESSION: 1. Small right pleural effusion with right basilar atelectasis versus pneumonia, similar to the prior study. 2. Aortic atherosclerosis. Electronically Signed   By: Logan Bores M.D.   On: 10/25/2016 20:13   Dg Knee 2 Views Right  Result Date: 10/25/2016 CLINICAL DATA:  Right knee pain.  History recent right hip fracture. EXAM: RIGHT KNEE - 1-2 VIEW COMPARISON:  None. FINDINGS: Trace joint effusion. No fracture or malalignment of the right knee. Spurring off the upper pole of the patella. Popliteal arteriosclerosis is noted. Mild osteopenia of the visualized fibula. IMPRESSION: Trace joint effusion. No acute osseous abnormality of the right knee. Electronically Signed   By: Ashley Royalty M.D.   On: 10/25/2016 22:59   Dg Hip Unilat  With Pelvis 2-3 Views Right  Result Date: 10/25/2016 CLINICAL DATA:  Right hip pain after fall.  Initial encounter. EXAM: DG HIP (WITH OR WITHOUT PELVIS) 2-3V RIGHT COMPARISON:  None. FINDINGS: There is a comminuted subcapital femoral neck fracture on the right which also appears to involve a portion of the inferolateral head. There is mild displacement. There is rotation of the more distal femur. The femoral head remains seated in the acetabulum. Limited evaluation of the left hip is without evidence of acute osseous abnormality. IMPRESSION: Right femoral neck fracture. Electronically Signed   By: Logan Bores M.D.   On: 10/25/2016 20:07    Positive ROS: All other systems have been reviewed and were otherwise negative with the exception of those mentioned in the HPI and as above.  Physical Exam: General: Alert, no acute distress Cardiovascular: No pedal edema Respiratory: No cyanosis, no use of accessory musculature GI: No organomegaly, abdomen is soft and non-tender Skin: No lesions in the area of chief  complaint Neurologic: Sensation intact distally Psychiatric: Patient is competent for consent with normal mood and affect Lymphatic: No axillary or cervical lymphadenopathy  MUSCULOSKELETAL: Examination of the right lower extremity reveals that he is shortened and rotated. He has pain with attempted logrolling of the hip. He has some lower extremity edema. He has palpable pulses. He has positive motor function dorsiflexion, plantarflexion, and great toe extension. He reports intact sensation.  Assessment: Displaced right femoral neck fracture, subacute  Plan: I discussed the findings with the patient. Given his high activity  level, I recommend right total hip arthroplasty as the most durable solution to his injury. We discussed the risks, benefits, and alternatives. Please see statement of risk. His INR is going to need to normalize prior to surgery. He already received vitamin K. His Coumadin is being held. We will make him nothing by mouth at midnight and reevaluate labs each morning. All questions were solicited and answered.   The risks, benefits, and alternatives were discussed with the patient. There are risks associated with the surgery including, but not limited to, problems with anesthesia (death), infection, instability (giving out of the joint), dislocation, differences in leg length/angulation/rotation, fracture of bones, loosening or failure of implants, hematoma (blood accumulation) which may require surgical drainage, blood clots, pulmonary embolism, nerve injury (foot drop and lateral thigh numbness), and blood vessel injury. The patient understands these risks and elects to proceed.   Darlean Warmoth, Horald Pollen, MD Cell [phone]    10/26/2016 3:52 PM

## 2016-10-26 NOTE — Progress Notes (Signed)
Hypoglycemic Event  CBG: 20  Treatment: 12oz Orange Juice  Symptoms: Asymptomatic  Follow-up CBG: Time: 1:24 CBG Result:147  Possible Reasons for Event: Patient was NPO  Comments/MD notified: Dr. Angelyn Punt

## 2016-10-26 NOTE — Progress Notes (Signed)
PROGRESS NOTE    Shaun Mclaughlin  IPJ:[id number] DOB: 1935-07-01 DOA: 10/25/2016 PCP: Gara Kroner, MD     Brief Narrative:  Shaun Mclaughlin is a 82 y.o. male with medical history significant of PAF on Coumadin, CAD, DM type II, combined systolic and diastolic CHF; who presents with right hip pain after fall. Reports falling 3 days ago while walking to bed. At that time he had gone into a coughing spell,  lost his balance, and fell landing on his right hip and knee. Pain continued to worsen over the next 3 days and finally presented to the ED. Work up revealed right femoral neck fracture.   Assessment & Plan:   Active Problems:   Hip fracture (HCC)   S/P right hip fracture  Right femoral neck fracture -Orthopedic surgery consulted -Pain control -Surgical intervention per ortho  Paroxysmal A Fib -CHADSVASc > 5  -Given 10mg  vit K in ED -Trend INR, hold coumadin  AKI -Baseline Cr 0.86 -IVF, creatinine is improving  HTN -Continue lisinopril, metoprolol  DM Type 2 -ISS  Hyperthyroidism -Continue PTU -Free T3, T4, TSH is pending  Hyperlipidemia -Continue atorvastatin    DVT prophylaxis: SCDs Code Status: Full Family Communication: Son at bedside Disposition Plan: Pending surgical intervention   Consultants:   Orthopedic surgery  Procedures:   None  Antimicrobials:   None    Subjective: Patient feeling well this morning. Continues to complain of right-sided hip pain. Swelling in his peripheral extremities has improved with IV Lasix in the emergency department. He has no complaints of chest pain or shortness of breath, denies any nausea, vomiting or diarrhea, no abdominal pain. Typically, he is independent in activities, still works 4 days a week.  Objective: Vitals:   10/26/16 1100 10/26/16 1130 10/26/16 1155 10/26/16 1214  BP: 133/81 (!) 152/106 (!) 146/82 (!) 144/80  Pulse: 92 88  84  Resp: 18 19  20   Temp:    98 F (36.7 C)  TempSrc:      SpO2:  91% 95%  95%  Weight:      Height:        Intake/Output Summary (Last 24 hours) at 10/26/16 1239 Last data filed at 10/26/16 0817  Gross per 24 hour  Intake                0 ml  Output              475 ml  Net             -475 ml   Filed Weights   10/25/16 1845  Weight: 74.8 kg (165 lb)    Examination:  General exam: Appears calm and comfortable  Respiratory system: Clear to auscultation. Respiratory effort normal. Cardiovascular system: S1 & S2 heard, Irregular rhythm with PVCs on tele. No JVD, murmurs, rubs, gallops or clicks. +1 pedal edema. Gastrointestinal system: Abdomen is nondistended, soft and nontender. No organomegaly or masses felt. Normal bowel sounds heard. Central nervous system: Alert and oriented. No focal neurological deficits. Extremities: Symmetric  Skin: No rashes, lesions or ulcers Psychiatry: Judgement and insight appear normal. Mood & affect appropriate.   Data Reviewed: I have personally reviewed following labs and imaging studies  CBC:  Recent Labs Lab 10/25/16 1858 10/26/16 0438  WBC 11.2* 7.5  NEUTROABS 9.7*  --   HGB 13.1 12.0*  HCT 38.9* 36.6*  MCV 90.9 90.8  PLT 229 734   Basic Metabolic Panel:  Recent Labs Lab 10/25/16 2018 10/26/16  0438  NA 131* 135  K 4.3 4.2  CL 96* 97*  CO2 22 25  GLUCOSE 178* 161*  BUN 29* 29*  CREATININE 1.28* 1.17  CALCIUM 8.4* 8.5*   GFR: Estimated Creatinine Clearance: 45.4 mL/min (by C-G formula based on SCr of 1.17 mg/dL). Liver Function Tests: No results for input(s): AST, ALT, ALKPHOS, BILITOT, PROT, ALBUMIN in the last 168 hours. No results for input(s): LIPASE, AMYLASE in the last 168 hours. No results for input(s): AMMONIA in the last 168 hours. Coagulation Profile:  Recent Labs Lab 10/25/16 1858 10/26/16 0438  INR 3.04 3.16   Cardiac Enzymes: No results for input(s): CKTOTAL, CKMB, CKMBINDEX, TROPONINI in the last 168 hours. BNP (last 3 results) No results for input(s): PROBNP  in the last 8760 hours. HbA1C: No results for input(s): HGBA1C in the last 72 hours. CBG:  Recent Labs Lab 10/26/16 0817  GLUCAP 147*   Lipid Profile: No results for input(s): CHOL, HDL, LDLCALC, TRIG, CHOLHDL, LDLDIRECT in the last 72 hours. Thyroid Function Tests: No results for input(s): TSH, T4TOTAL, FREET4, T3FREE, THYROIDAB in the last 72 hours. Anemia Panel: No results for input(s): VITAMINB12, FOLATE, FERRITIN, TIBC, IRON, RETICCTPCT in the last 72 hours. Sepsis Labs: No results for input(s): PROCALCITON, LATICACIDVEN in the last 168 hours.  No results found for this or any previous visit (from the past 240 hour(s)).     Radiology Studies: Dg Chest 1 View  Result Date: 10/25/2016 CLINICAL DATA:  Right hip pain status post fall. EXAM: CHEST 1 VIEW COMPARISON:  05/17/2015 FINDINGS: The cardiac silhouette remains enlarged. Aortic atherosclerosis is noted. There is a persistent small right pleural effusion with patchy right basilar parenchymal airspace opacity, similar to the prior study. The left lung is clear. No pneumothorax is seen. A left subclavian region vascular stent is noted. There is mild thoracic spondylosis. IMPRESSION: 1. Small right pleural effusion with right basilar atelectasis versus pneumonia, similar to the prior study. 2. Aortic atherosclerosis. Electronically Signed   By: Logan Bores M.D.   On: 10/25/2016 20:13   Dg Knee 2 Views Right  Result Date: 10/25/2016 CLINICAL DATA:  Right knee pain.  History recent right hip fracture. EXAM: RIGHT KNEE - 1-2 VIEW COMPARISON:  None. FINDINGS: Trace joint effusion. No fracture or malalignment of the right knee. Spurring off the upper pole of the patella. Popliteal arteriosclerosis is noted. Mild osteopenia of the visualized fibula. IMPRESSION: Trace joint effusion. No acute osseous abnormality of the right knee. Electronically Signed   By: Ashley Royalty M.D.   On: 10/25/2016 22:59   Dg Hip Unilat  With Pelvis 2-3 Views  Right  Result Date: 10/25/2016 CLINICAL DATA:  Right hip pain after fall.  Initial encounter. EXAM: DG HIP (WITH OR WITHOUT PELVIS) 2-3V RIGHT COMPARISON:  None. FINDINGS: There is a comminuted subcapital femoral neck fracture on the right which also appears to involve a portion of the inferolateral head. There is mild displacement. There is rotation of the more distal femur. The femoral head remains seated in the acetabulum. Limited evaluation of the left hip is without evidence of acute osseous abnormality. IMPRESSION: Right femoral neck fracture. Electronically Signed   By: Logan Bores M.D.   On: 10/25/2016 20:07      Scheduled Meds: . atorvastatin  40 mg Oral Daily  . hydroxypropyl methylcellulose / hypromellose  2 drop Both Eyes Daily  . insulin aspart  0-5 Units Subcutaneous QHS  . insulin aspart  0-9 Units Subcutaneous TID WC  .  latanoprost  1 drop Both Eyes QHS  . lisinopril  40 mg Oral Daily  . metoprolol tartrate  25 mg Oral BID  . [START ON 10/27/2016] nicotine  21 mg Transdermal Daily  . pantoprazole  40 mg Oral Daily  . propylthiouracil  50 mg Oral Daily   Continuous Infusions:   LOS: 1 day    Time spent: 40 minutes   Dessa Phi, DO [facility] [email or web address] Password [facility] 10/26/2016, 12:39 PM

## 2016-10-26 NOTE — ED Notes (Signed)
Ordered a hospital bed 

## 2016-10-27 ENCOUNTER — Telehealth: Payer: Self-pay | Admitting: Cardiovascular Disease

## 2016-10-27 ENCOUNTER — Inpatient Hospital Stay (HOSPITAL_COMMUNITY): Payer: Medicare Other | Admitting: Certified Registered Nurse Anesthetist

## 2016-10-27 ENCOUNTER — Encounter (HOSPITAL_COMMUNITY): Payer: Self-pay | Admitting: Surgery

## 2016-10-27 ENCOUNTER — Inpatient Hospital Stay (HOSPITAL_COMMUNITY): Payer: Medicare Other

## 2016-10-27 ENCOUNTER — Encounter (HOSPITAL_COMMUNITY)
Admission: EM | Disposition: A | Discharge status: Home Health | Payer: Self-pay | Source: Home / Self Care | Attending: Family Medicine

## 2016-10-27 DIAGNOSIS — S72001A Fracture of unspecified part of neck of right femur, initial encounter for closed fracture: Secondary | ICD-10-CM | POA: Diagnosis present

## 2016-10-27 HISTORY — PX: TOTAL HIP ARTHROPLASTY: SHX124

## 2016-10-27 LAB — GLUCOSE, CAPILLARY
GLUCOSE-CAPILLARY: 124 mg/dL — AB (ref 65–99)
Glucose-Capillary: 144 mg/dL — ABNORMAL HIGH (ref 65–99)
Glucose-Capillary: 124 mg/dL — ABNORMAL HIGH (ref 65–99)
Glucose-Capillary: 159 mg/dL — ABNORMAL HIGH (ref 65–99)

## 2016-10-27 LAB — CBC
HEMATOCRIT: 37.3 % — AB (ref 39.0–52.0)
Hemoglobin: 12.6 g/dL — ABNORMAL LOW (ref 13.0–17.0)
MCH: 31 pg (ref 26.0–34.0)
MCHC: 33.8 g/dL (ref 30.0–36.0)
MCV: 91.6 fL (ref 78.0–100.0)
Platelets: 185 10*3/uL (ref 150–400)
RBC: 4.07 MIL/uL — ABNORMAL LOW (ref 4.22–5.81)
RDW: 16.8 % — ABNORMAL HIGH (ref 11.5–15.5)
WBC: 6.5 10*3/uL (ref 4.0–10.5)

## 2016-10-27 LAB — BASIC METABOLIC PANEL
Anion gap: 12 (ref 5–15)
BUN: 27 mg/dL — AB (ref 6–20)
CALCIUM: 8.5 mg/dL — AB (ref 8.9–10.3)
CO2: 27 mmol/L (ref 22–32)
CREATININE: 1.08 mg/dL (ref 0.61–1.24)
Chloride: 95 mmol/L — ABNORMAL LOW (ref 101–111)
GFR calc non Af Amer: >60 mL/min (ref >60)
Glucose, Bld: 132 mg/dL — ABNORMAL HIGH (ref 65–99)
Potassium: 4.7 mmol/L (ref 3.5–5.1)
SODIUM: 134 mmol/L — AB (ref 135–145)

## 2016-10-27 LAB — SURGICAL PCR SCREEN
MRSA, PCR: NEGATIVE
STAPHYLOCOCCUS AUREUS: POSITIVE — AB

## 2016-10-27 LAB — T3, FREE: T3 FREE: 3.1 pg/mL (ref 2.0–4.4)

## 2016-10-27 LAB — PROTIME-INR
INR: 1.59
Prothrombin Time: 19.2 seconds — ABNORMAL HIGH (ref 11.4–15.2)

## 2016-10-27 SURGERY — ARTHROPLASTY, HIP, TOTAL, ANTERIOR APPROACH
Anesthesia: General | Site: Hip | Laterality: Right

## 2016-10-27 MED ORDER — ESMOLOL HCL 100 MG/10ML IV SOLN
INTRAVENOUS | Status: DC | PRN
  Administered 2016-10-27: 20 mg via INTRAVENOUS

## 2016-10-27 MED ORDER — DEXAMETHASONE SODIUM PHOSPHATE 10 MG/ML IJ SOLN
INTRAMUSCULAR | Status: AC
  Filled 2016-10-27: qty 1

## 2016-10-27 MED ORDER — PHENYLEPHRINE 40 MCG/ML (10ML) SYRINGE FOR IV PUSH (FOR BLOOD PRESSURE SUPPORT)
PREFILLED_SYRINGE | INTRAVENOUS | Status: AC
  Filled 2016-10-27: qty 20

## 2016-10-27 MED ORDER — KETOROLAC TROMETHAMINE 30 MG/ML IJ SOLN
INTRAMUSCULAR | Status: DC | PRN
  Administered 2016-10-27: 30 mg via INTRA_ARTICULAR

## 2016-10-27 MED ORDER — BUPIVACAINE HCL (PF) 0.25 % IJ SOLN
INTRAMUSCULAR | Status: AC
  Filled 2016-10-27: qty 30

## 2016-10-27 MED ORDER — ROCURONIUM BROMIDE 50 MG/5ML IV SOSY
PREFILLED_SYRINGE | INTRAVENOUS | Status: AC
  Filled 2016-10-27: qty 5

## 2016-10-27 MED ORDER — ESMOLOL HCL 100 MG/10ML IV SOLN
INTRAVENOUS | Status: AC
  Filled 2016-10-27: qty 10

## 2016-10-27 MED ORDER — WARFARIN - PHARMACIST DOSING INPATIENT
Freq: Every day | Status: DC
  Administered 2016-10-28: 18:00:00

## 2016-10-27 MED ORDER — ENOXAPARIN SODIUM 30 MG/0.3ML ~~LOC~~ SOLN
30.0000 mg | SUBCUTANEOUS | Status: DC
  Administered 2016-10-28 – 2016-10-30 (×3): 30 mg via SUBCUTANEOUS
  Filled 2016-10-27 (×3): qty 0.3

## 2016-10-27 MED ORDER — SODIUM CHLORIDE 0.9 % IR SOLN
Status: DC | PRN
  Administered 2016-10-27: 3000 mL

## 2016-10-27 MED ORDER — DEXTROSE 5 % IV SOLN
500.0000 mg | Freq: Once | INTRAVENOUS | Status: DC
  Filled 2016-10-27: qty 5

## 2016-10-27 MED ORDER — EPINEPHRINE PF 1 MG/ML IJ SOLN
INTRAMUSCULAR | Status: AC
  Filled 2016-10-27: qty 1

## 2016-10-27 MED ORDER — ACETAMINOPHEN 10 MG/ML IV SOLN
INTRAVENOUS | Status: AC
  Filled 2016-10-27: qty 100

## 2016-10-27 MED ORDER — PHENYLEPHRINE HCL 10 MG/ML IJ SOLN
INTRAVENOUS | Status: DC | PRN
  Administered 2016-10-27: 40 ug/min via INTRAVENOUS

## 2016-10-27 MED ORDER — CEFAZOLIN SODIUM-DEXTROSE 2-4 GM/100ML-% IV SOLN
2.0000 g | Freq: Four times a day (QID) | INTRAVENOUS | Status: AC
  Administered 2016-10-28 (×2): 2 g via INTRAVENOUS
  Filled 2016-10-27 (×2): qty 100

## 2016-10-27 MED ORDER — MUPIROCIN 2 % EX OINT
TOPICAL_OINTMENT | CUTANEOUS | Status: AC
  Administered 2016-10-27: 16:00:00
  Filled 2016-10-27: qty 22

## 2016-10-27 MED ORDER — CEFAZOLIN SODIUM-DEXTROSE 2-4 GM/100ML-% IV SOLN
2.0000 g | INTRAVENOUS | Status: AC
  Administered 2016-10-27: 2 g via INTRAVENOUS
  Filled 2016-10-27 (×2): qty 100

## 2016-10-27 MED ORDER — ACETAMINOPHEN 325 MG PO TABS: 650.0000 mg | ORAL_TABLET | Freq: Four times a day (QID) | ORAL | Status: DC | PRN

## 2016-10-27 MED ORDER — ONDANSETRON HCL 4 MG/2ML IJ SOLN
INTRAMUSCULAR | Status: DC | PRN
  Administered 2016-10-27: 4 mg via INTRAVENOUS

## 2016-10-27 MED ORDER — ALBUMIN HUMAN 5 % IV SOLN
INTRAVENOUS | Status: DC | PRN
  Administered 2016-10-27: 20:00:00 via INTRAVENOUS

## 2016-10-27 MED ORDER — BUPIVACAINE HCL 0.25 % IJ SOLN
INTRAMUSCULAR | Status: DC | PRN
  Administered 2016-10-27: 30 mL

## 2016-10-27 MED ORDER — METOCLOPRAMIDE HCL 10 MG PO TABS: 5.0000 mg | ORAL_TABLET | Freq: Three times a day (TID) | ORAL | Status: DC | PRN

## 2016-10-27 MED ORDER — METOPROLOL TARTRATE 5 MG/5ML IV SOLN
INTRAVENOUS | Status: AC
  Administered 2016-10-27: 2.5 mg via INTRAVENOUS
  Filled 2016-10-27: qty 5

## 2016-10-27 MED ORDER — 0.9 % SODIUM CHLORIDE (POUR BTL) OPTIME
TOPICAL | Status: DC | PRN
  Administered 2016-10-27: 1000 mL

## 2016-10-27 MED ORDER — TRANEXAMIC ACID 1000 MG/10ML IV SOLN
1000.0000 mg | INTRAVENOUS | Status: AC
  Administered 2016-10-27: 1000 mg via INTRAVENOUS
  Filled 2016-10-27: qty 10

## 2016-10-27 MED ORDER — ROCURONIUM BROMIDE 100 MG/10ML IV SOLN
INTRAVENOUS | Status: DC | PRN
  Administered 2016-10-27: 10 mg via INTRAVENOUS
  Administered 2016-10-27: 50 mg via INTRAVENOUS

## 2016-10-27 MED ORDER — ONDANSETRON HCL 4 MG/2ML IJ SOLN: 4.0000 mg | Freq: Once | INTRAMUSCULAR | Status: DC | PRN

## 2016-10-27 MED ORDER — FENTANYL CITRATE (PF) 100 MCG/2ML IJ SOLN
INTRAMUSCULAR | Status: AC
  Administered 2016-10-27: 25 ug via INTRAVENOUS
  Filled 2016-10-27: qty 2

## 2016-10-27 MED ORDER — ONDANSETRON HCL 4 MG PO TABS: 4.0000 mg | ORAL_TABLET | Freq: Four times a day (QID) | ORAL | Status: DC | PRN

## 2016-10-27 MED ORDER — LACTATED RINGERS IV SOLN
INTRAVENOUS | Status: DC | PRN
  Administered 2016-10-27 (×2): via INTRAVENOUS

## 2016-10-27 MED ORDER — FENTANYL CITRATE (PF) 100 MCG/2ML IJ SOLN
INTRAMUSCULAR | Status: DC | PRN
  Administered 2016-10-27 (×2): 50 ug via INTRAVENOUS
  Administered 2016-10-27: 100 ug via INTRAVENOUS

## 2016-10-27 MED ORDER — MENTHOL 3 MG MT LOZG: 1.0000 | LOZENGE | OROMUCOSAL | Status: DC | PRN

## 2016-10-27 MED ORDER — FENTANYL CITRATE (PF) 100 MCG/2ML IJ SOLN
25.0000 ug | INTRAMUSCULAR | Status: DC | PRN
  Administered 2016-10-27 (×2): 25 ug via INTRAVENOUS

## 2016-10-27 MED ORDER — METOPROLOL TARTRATE 5 MG/5ML IV SOLN
2.5000 mg | Freq: Once | INTRAVENOUS | Status: AC
  Administered 2016-10-27: 2.5 mg via INTRAVENOUS

## 2016-10-27 MED ORDER — ACETAMINOPHEN 650 MG RE SUPP: 650.0000 mg | Freq: Four times a day (QID) | RECTAL | Status: DC | PRN

## 2016-10-27 MED ORDER — ACETAMINOPHEN 10 MG/ML IV SOLN
1000.0000 mg | INTRAVENOUS | Status: AC
  Administered 2016-10-27: 1000 mg via INTRAVENOUS
  Filled 2016-10-27: qty 100

## 2016-10-27 MED ORDER — WARFARIN SODIUM 7.5 MG PO TABS
7.5000 mg | ORAL_TABLET | ORAL | Status: AC
  Administered 2016-10-27: 7.5 mg via ORAL
  Filled 2016-10-27: qty 1

## 2016-10-27 MED ORDER — DEXAMETHASONE SODIUM PHOSPHATE 10 MG/ML IJ SOLN
INTRAMUSCULAR | Status: DC | PRN
  Administered 2016-10-27: 8 mg via INTRAVENOUS

## 2016-10-27 MED ORDER — PHENOL 1.4 % MT LIQD: 1.0000 | OROMUCOSAL | Status: DC | PRN

## 2016-10-27 MED ORDER — FENTANYL CITRATE (PF) 250 MCG/5ML IJ SOLN
INTRAMUSCULAR | Status: AC
  Filled 2016-10-27: qty 5

## 2016-10-27 MED ORDER — CHLORHEXIDINE GLUCONATE 4 % EX LIQD
60.0000 mL | Freq: Once | CUTANEOUS | Status: AC
  Administered 2016-10-27: 4 via TOPICAL
  Filled 2016-10-27: qty 60

## 2016-10-27 MED ORDER — PROPOFOL 10 MG/ML IV BOLUS
INTRAVENOUS | Status: DC | PRN
  Administered 2016-10-27: 110 mg via INTRAVENOUS

## 2016-10-27 MED ORDER — SUGAMMADEX SODIUM 200 MG/2ML IV SOLN
INTRAVENOUS | Status: DC | PRN
  Administered 2016-10-27: 200 mg via INTRAVENOUS

## 2016-10-27 MED ORDER — LIDOCAINE HCL (CARDIAC) 20 MG/ML IV SOLN
INTRAVENOUS | Status: DC | PRN
  Administered 2016-10-27: 80 mg via INTRAVENOUS

## 2016-10-27 MED ORDER — SODIUM CHLORIDE 0.9 % IJ SOLN
INTRAMUSCULAR | Status: DC | PRN
  Administered 2016-10-27: 28 mL

## 2016-10-27 MED ORDER — ONDANSETRON HCL 4 MG/2ML IJ SOLN: 4.0000 mg | Freq: Four times a day (QID) | INTRAMUSCULAR | Status: DC | PRN

## 2016-10-27 MED ORDER — POVIDONE-IODINE 10 % EX SWAB
2.0000 "application " | Freq: Once | CUTANEOUS | Status: AC
  Administered 2016-10-27: 2 via TOPICAL

## 2016-10-27 MED ORDER — PHENYLEPHRINE HCL 10 MG/ML IJ SOLN
INTRAMUSCULAR | Status: DC | PRN
  Administered 2016-10-27: 120 mg via INTRAVENOUS
  Administered 2016-10-27: 100 mg via INTRAVENOUS

## 2016-10-27 MED ORDER — PROPOFOL 10 MG/ML IV BOLUS
INTRAVENOUS | Status: AC
  Filled 2016-10-27: qty 20

## 2016-10-27 MED ORDER — METOCLOPRAMIDE HCL 5 MG/ML IJ SOLN: 5.0000 mg | Freq: Three times a day (TID) | INTRAMUSCULAR | Status: DC | PRN

## 2016-10-27 SURGICAL SUPPLY — 53 items
ADH SKN CLS APL DERMABOND .7 (GAUZE/BANDAGES/DRESSINGS) ×1
ALCOHOL ISOPROPYL (RUBBING) (MISCELLANEOUS) ×3 IMPLANT
BLADE CLIPPER SURG (BLADE) ×2 IMPLANT
CAPT HIP TOTAL 2 ×3 IMPLANT
CHLORAPREP W/TINT 26ML (MISCELLANEOUS) ×3 IMPLANT
COVER SURGICAL LIGHT HANDLE (MISCELLANEOUS) ×3 IMPLANT
DERMABOND ADVANCED (GAUZE/BANDAGES/DRESSINGS) ×2
DERMABOND ADVANCED .7 DNX12 (GAUZE/BANDAGES/DRESSINGS) ×1 IMPLANT
DRAPE C-ARM 42X72 X-RAY (DRAPES) ×3 IMPLANT
DRAPE STERI IOBAN 125X83 (DRAPES) ×3 IMPLANT
DRAPE U-SHAPE 47X51 STRL (DRAPES) ×9 IMPLANT
DRSG AQUACEL AG ADV 3.5X10 (GAUZE/BANDAGES/DRESSINGS) ×3 IMPLANT
ELECT BLADE 4.0 EZ CLEAN MEGAD (MISCELLANEOUS) ×3
ELECT REM PT RETURN 9FT ADLT (ELECTROSURGICAL) ×3
ELECTRODE BLDE 4.0 EZ CLN MEGD (MISCELLANEOUS) ×1 IMPLANT
ELECTRODE REM PT RTRN 9FT ADLT (ELECTROSURGICAL) ×1 IMPLANT
EVACUATOR 1/8 PVC DRAIN (DRAIN) IMPLANT
GLOVE BIO SURGEON STRL SZ8.5 (GLOVE) ×6 IMPLANT
GLOVE BIOGEL PI IND STRL 8.5 (GLOVE) ×1 IMPLANT
GLOVE BIOGEL PI INDICATOR 8.5 (GLOVE) ×2
GOWN STRL REUS W/ TWL LRG LVL3 (GOWN DISPOSABLE) ×2 IMPLANT
GOWN STRL REUS W/TWL 2XL LVL3 (GOWN DISPOSABLE) ×3 IMPLANT
GOWN STRL REUS W/TWL LRG LVL3 (GOWN DISPOSABLE) ×6
HANDPIECE INTERPULSE COAX TIP (DISPOSABLE) ×3
HOOD PEEL AWAY FACE SHEILD DIS (HOOD) ×6 IMPLANT
HOOD PEEL AWAY FLYTE STAYCOOL (MISCELLANEOUS) ×2 IMPLANT
KIT BASIN OR (CUSTOM PROCEDURE TRAY) ×3 IMPLANT
KIT ROOM TURNOVER OR (KITS) ×3 IMPLANT
MANIFOLD NEPTUNE II (INSTRUMENTS) ×3 IMPLANT
MARKER SKIN DUAL TIP RULER LAB (MISCELLANEOUS) ×6 IMPLANT
NDL SPNL 18GX3.5 QUINCKE PK (NEEDLE) ×1 IMPLANT
NEEDLE SPNL 18GX3.5 QUINCKE PK (NEEDLE) ×3 IMPLANT
NS IRRIG 1000ML POUR BTL (IV SOLUTION) ×3 IMPLANT
PACK TOTAL JOINT (CUSTOM PROCEDURE TRAY) ×3 IMPLANT
PACK UNIVERSAL I (CUSTOM PROCEDURE TRAY) ×3 IMPLANT
PAD ARMBOARD 7.5X6 YLW CONV (MISCELLANEOUS) ×6 IMPLANT
SAW OSC TIP CART 19.5X105X1.3 (SAW) ×3 IMPLANT
SEALER BIPOLAR AQUA 6.0 (INSTRUMENTS) ×2 IMPLANT
SET HNDPC FAN SPRY TIP SCT (DISPOSABLE) ×1 IMPLANT
SUT ETHIBOND NAB CT1 #1 30IN (SUTURE) ×6 IMPLANT
SUT MNCRL AB 3-0 PS2 18 (SUTURE) ×3 IMPLANT
SUT MON AB 2-0 CT1 36 (SUTURE) ×3 IMPLANT
SUT VIC AB 1 CT1 27 (SUTURE) ×3
SUT VIC AB 1 CT1 27XBRD ANBCTR (SUTURE) ×1 IMPLANT
SUT VIC AB 2-0 CT1 27 (SUTURE) ×3
SUT VIC AB 2-0 CT1 TAPERPNT 27 (SUTURE) ×1 IMPLANT
SUT VLOC 180 0 24IN GS25 (SUTURE) ×3 IMPLANT
SYR 50ML LL SCALE MARK (SYRINGE) ×3 IMPLANT
TOWEL OR 17X24 6PK STRL BLUE (TOWEL DISPOSABLE) ×3 IMPLANT
TOWEL OR 17X26 10 PK STRL BLUE (TOWEL DISPOSABLE) ×3 IMPLANT
TRAY CATH 16FR W/PLASTIC CATH (SET/KITS/TRAYS/PACK) IMPLANT
TRAY FOLEY CATH 16FR SILVER (SET/KITS/TRAYS/PACK) IMPLANT
YANKAUER SUCT BULB TIP NO VENT (SUCTIONS) ×2 IMPLANT

## 2016-10-27 NOTE — Discharge Instructions (Signed)
°Dr. Lynden Carrithers °Joint Replacement Specialist °[facility] °[address] °[place], [place] [address] °[phone] ° ° °TOTAL HIP REPLACEMENT POSTOPERATIVE DIRECTIONS ° ° ° °Hip Rehabilitation, Guidelines Following Surgery  ° °WEIGHT BEARING °Weight bearing as tolerated with assist device (walker, cane, etc) as directed, use it as long as suggested by your surgeon or therapist, typically at least 4-6 weeks. ° °The results of a hip operation are greatly improved after range of motion and muscle strengthening exercises. Follow all safety measures which are given to protect your hip. If any of these exercises cause increased pain or swelling in your joint, decrease the amount until you are comfortable again. Then slowly increase the exercises. Call your caregiver if you have problems or questions.  ° °HOME CARE INSTRUCTIONS  °Most of the following instructions are designed to prevent the dislocation of your new hip.  °Remove items at home which could result in a fall. This includes throw rugs or furniture in walking pathways.  °Continue medications as instructed at time of discharge. °· You may have some home medications which will be placed on hold until you complete the course of blood thinner medication. °· You may start showering once you are discharged home. Do not remove your dressing. °Do not put on socks or shoes without following the instructions of your caregivers.   °Sit on chairs with arms. Use the chair arms to help push yourself up when arising.  °Arrange for the use of a toilet seat elevator so you are not sitting low.  °· Walk with walker as instructed.  °You may resume a sexual relationship in one month or when given the OK by your caregiver.  °Use walker as long as suggested by your caregivers.  °You may put full weight on your legs and walk as much as is comfortable. °Avoid periods of inactivity such as sitting longer than an hour when not asleep. This helps prevent  blood clots.  °You may return to work once you are cleared by your surgeon.  °Do not drive a car for 6 weeks or until released by your surgeon.  °Do not drive while taking narcotics.  °Wear elastic stockings for two weeks following surgery during the day but you may remove then at night.  °Make sure you keep all of your appointments after your operation with all of your doctors and caregivers. You should call the office at the above phone number and make an appointment for approximately two weeks after the date of your surgery. °Please pick up a stool softener and laxative for home use as long as you are requiring pain medications. °· ICE to the affected hip every three hours for 30 minutes at a time and then as needed for pain and swelling. Continue to use ice on the hip for pain and swelling from surgery. You may notice swelling that will progress down to the foot and ankle.  This is normal after surgery.  Elevate the leg when you are not up walking on it.   °It is important for you to complete the blood thinner medication as prescribed by your doctor. °· Continue to use the breathing machine which will help keep your temperature down.  It is common for your temperature to cycle up and down following surgery, especially at night when you are not up moving around and exerting yourself.  The breathing machine keeps your lungs expanded and your temperature down. ° °RANGE OF MOTION AND STRENGTHENING EXERCISES  °These exercises are   designed to help you keep full movement of your hip joint. Follow your caregiver's or physical therapist's instructions. Perform all exercises about fifteen times, three times per day or as directed. Exercise both hips, even if you have had only one joint replacement. These exercises can be done on a training (exercise) mat, on the floor, on a table or on a bed. Use whatever works the best and is most comfortable for you. Use music or television while you are exercising so that the exercises  are a pleasant break in your day. This will make your life better with the exercises acting as a break in routine you can look forward to.  °Lying on your back, slowly slide your foot toward your buttocks, raising your knee up off the floor. Then slowly slide your foot back down until your leg is straight again.  °Lying on your back spread your legs as far apart as you can without causing discomfort.  °Lying on your side, raise your upper leg and foot straight up from the floor as far as is comfortable. Slowly lower the leg and repeat.  °Lying on your back, tighten up the muscle in the front of your thigh (quadriceps muscles). You can do this by keeping your leg straight and trying to raise your heel off the floor. This helps strengthen the largest muscle supporting your knee.  °Lying on your back, tighten up the muscles of your buttocks both with the legs straight and with the knee bent at a comfortable angle while keeping your heel on the floor.  ° °SKILLED REHAB INSTRUCTIONS: °If the patient is transferred to a skilled rehab facility following release from the hospital, a list of the current medications will be sent to the facility for the patient to continue.  When discharged from the skilled rehab facility, please have the facility set up the patient's Home Health Physical Therapy prior to being released. Also, the skilled facility will be responsible for providing the patient with their medications at time of release from the facility to include their pain medication and their blood thinner medication. If the patient is still at the rehab facility at time of the two week follow up appointment, the skilled rehab facility will also need to assist the patient in arranging follow up appointment in our office and any transportation needs. ° °MAKE SURE YOU:  °Understand these instructions.  °Will watch your condition.  °Will get help right away if you are not doing well or get worse. ° °Pick up stool softner and  laxative for home use following surgery while on pain medications. °Do not remove your dressing. °The dressing is waterproof--it is OK to take showers. °Continue to use ice for pain and swelling after surgery. °Do not use any lotions or creams on the incision until instructed by your surgeon. °Total Hip Protocol. ° ° °

## 2016-10-27 NOTE — Anesthesia Postprocedure Evaluation (Addendum)
Anesthesia Post Note  Patient: Shaun Mclaughlin  Procedure(s) Performed: Procedure(s) (LRB): TOTAL HIP ARTHROPLASTY ANTERIOR APPROACH (Right)  Patient location during evaluation: PACU Anesthesia Type: General Level of consciousness: awake and alert Pain management: pain level controlled Vital Signs Assessment: post-procedure vital signs reviewed and stable Respiratory status: spontaneous breathing, nonlabored ventilation, respiratory function stable and patient connected to nasal cannula oxygen Cardiovascular status: stable Postop Assessment: no signs of nausea or vomiting Anesthetic complications: no       Last Vitals:  Vitals:   10/27/16 2100 10/27/16 2115  BP:  125/68  Pulse: 97 98  Resp: 14 12  Temp:  36.1 C    Last Pain:  Vitals:   10/27/16 2100  TempSrc:   PainSc: 0-No pain                 Mellony Danziger

## 2016-10-27 NOTE — OR Nursing (Signed)
Shaun Mclaughlin received from the OR on a bed and attached to medical devices.  Pt resting, no s/s pain, HR 130s afib, BP 130/83, RR 15 with RA sats 92-93%.  Pt woke about 2040 and stated he was having a "little bit" of pain in his chest.  He also stated he was having a "little bit" of trouble catching his breath.  Dr. Ermalene Postin notified. Lopressor 2.5 mg IV ordered and given.

## 2016-10-27 NOTE — Telephone Encounter (Signed)
Returned call to patient-patient states he fell and fractured his hip and will be having surgery this afternoon.  Wanted to make Dr. Sallyanne Kuster aware.  (notes in epic)    Advised I would route to Dr. Loletha Grayer to make aware.  Patient aware and verbalized understanding.

## 2016-10-27 NOTE — Progress Notes (Signed)
PROGRESS NOTE    Shaun Mclaughlin  FUX:[id number] DOB: Mar 11, 1935 DOA: 10/25/2016 PCP: Gara Kroner, MD     Brief Narrative:  Shaun Mclaughlin is a 82 y.o. male with medical history significant of PAF on Coumadin, CAD, DM type II, combined systolic and diastolic CHF; who presents with right hip pain after fall. Reports falling 3 days ago while walking to bed. At that time he had gone into a coughing spell,  lost his balance, and fell landing on his right hip and knee. Pain continued to worsen over the next 3 days and finally presented to the ED. Work up revealed right femoral neck fracture.   Assessment & Plan:   Principal Problem:   Hip fracture ([place]) Active Problems:   Persistent atrial fibrillation (HCC)   Chronic anticoagulation, coumadin for PAF   DM (diabetes mellitus) ([place])   HTN (hypertension)   Acute on chronic combined systolic and diastolic congestive heart failure, NYHA class 2 (HCC)   Chronic combined systolic and diastolic heart failure, NYHA class 2 (HCC)   S/P right hip fracture   Hyperthyroidism  Right femoral neck fracture -Orthopedic surgery consulted -Pain control -Surgical intervention per ortho Lyndel Safe periop cardiac risk 0.82%  Paroxysmal A Fib -CHADSVASc > 5  -Given 10mg  vit K in ED -Trend INR, hold coumadin  AKI -Baseline Cr 0.86 -IVF, creatinine is improving  HTN -Continue lisinopril, metoprolol  DM Type 2 -ISS  Hyperthyroidism -Continue PTU  Hyperlipidemia -Continue atorvastatin    DVT prophylaxis: SCDs Code Status: Full Family Communication: Son at bedside 4/2  Disposition Plan: Pending surgical intervention   Consultants:   Orthopedic surgery  Procedures:   None  Antimicrobials:   None    Subjective: Patient states that he is doing well this morning. No complaints of chest pain, shortness of breath, awaiting surgery this afternoon.  Objective: Vitals:   10/27/16 0448 10/27/16 0934 10/27/16 1255 10/27/16 1300  BP:  (!) 153/85 (!) 157/78 (!) 148/74 140/70  Pulse: (!) 104 (!) 102 97 80  Resp: 18  20   Temp: 98 F (36.7 C)   98.6 F (37 C)  TempSrc:    Oral  SpO2: 96%  96% 95%  Weight:      Height:        Intake/Output Summary (Last 24 hours) at 10/27/16 1410 Last data filed at 10/27/16 1143  Gross per 24 hour  Intake                0 ml  Output             1250 ml  Net            -1250 ml   Filed Weights   10/25/16 1845 10/26/16 1629  Weight: 74.8 kg (165 lb) 78.6 kg (173 lb 4.5 oz)    Examination:  General exam: Appears calm and comfortable  Respiratory system: Clear to auscultation. Respiratory effort normal. Cardiovascular system: S1 & S2 heard, Irregular rhythm with PVCs on tele. No JVD, murmurs, rubs, gallops or clicks. Trace pedal edema. Gastrointestinal system: Abdomen is nondistended, soft and nontender. No organomegaly or masses felt. Normal bowel sounds heard. Central nervous system: Alert and oriented. No focal neurological deficits. Extremities: Symmetric  Skin: No rashes, lesions or ulcers Psychiatry: Judgement and insight appear normal. Mood & affect appropriate.   Data Reviewed: I have personally reviewed following labs and imaging studies  CBC:  Recent Labs Lab 10/25/16 1858 10/26/16 0438 10/27/16 0533  WBC 11.2* 7.5  6.5  NEUTROABS 9.7*  --   --   HGB 13.1 12.0* 12.6*  HCT 38.9* 36.6* 37.3*  MCV 90.9 90.8 91.6  PLT 229 203 182   Basic Metabolic Panel:  Recent Labs Lab 10/25/16 2018 10/26/16 0438 10/27/16 0533  NA 131* 135 134*  K 4.3 4.2 4.7  CL 96* 97* 95*  CO2 22 25 27   GLUCOSE 178* 161* 132*  BUN 29* 29* 27*  CREATININE 1.28* 1.17 1.08  CALCIUM 8.4* 8.5* 8.5*   GFR: Estimated Creatinine Clearance: 53.8 mL/min (by C-G formula based on SCr of 1.08 mg/dL). Liver Function Tests: No results for input(s): AST, ALT, ALKPHOS, BILITOT, PROT, ALBUMIN in the last 168 hours. No results for input(s): LIPASE, AMYLASE in the last 168 hours. No results for  input(s): AMMONIA in the last 168 hours. Coagulation Profile:  Recent Labs Lab 10/25/16 1858 10/26/16 0438 10/27/16 0533  INR 3.04 3.16 1.59   Cardiac Enzymes: No results for input(s): CKTOTAL, CKMB, CKMBINDEX, TROPONINI in the last 168 hours. BNP (last 3 results) No results for input(s): PROBNP in the last 8760 hours. HbA1C: No results for input(s): HGBA1C in the last 72 hours. CBG:  Recent Labs Lab 10/26/16 1324 10/26/16 1716 10/26/16 2130 10/27/16 0745 10/27/16 1143  GLUCAP 147* 112* 200* 144* 124*   Lipid Profile: No results for input(s): CHOL, HDL, LDLCALC, TRIG, CHOLHDL, LDLDIRECT in the last 72 hours. Thyroid Function Tests:  Recent Labs  10/26/16 0448 10/26/16 1300  TSH  --  1.242  FREET4  --  1.35*  T3FREE 3.1  --    Anemia Panel: No results for input(s): VITAMINB12, FOLATE, FERRITIN, TIBC, IRON, RETICCTPCT in the last 72 hours. Sepsis Labs: No results for input(s): PROCALCITON, LATICACIDVEN in the last 168 hours.  No results found for this or any previous visit (from the past 240 hour(s)).     Radiology Studies: Dg Chest 1 View  Result Date: 10/25/2016 CLINICAL DATA:  Right hip pain status post fall. EXAM: CHEST 1 VIEW COMPARISON:  05/17/2015 FINDINGS: The cardiac silhouette remains enlarged. Aortic atherosclerosis is noted. There is a persistent small right pleural effusion with patchy right basilar parenchymal airspace opacity, similar to the prior study. The left lung is clear. No pneumothorax is seen. A left subclavian region vascular stent is noted. There is mild thoracic spondylosis. IMPRESSION: 1. Small right pleural effusion with right basilar atelectasis versus pneumonia, similar to the prior study. 2. Aortic atherosclerosis. Electronically Signed   By: Logan Bores M.D.   On: 10/25/2016 20:13   Dg Knee 2 Views Right  Result Date: 10/25/2016 CLINICAL DATA:  Right knee pain.  History recent right hip fracture. EXAM: RIGHT KNEE - 1-2 VIEW  COMPARISON:  None. FINDINGS: Trace joint effusion. No fracture or malalignment of the right knee. Spurring off the upper pole of the patella. Popliteal arteriosclerosis is noted. Mild osteopenia of the visualized fibula. IMPRESSION: Trace joint effusion. No acute osseous abnormality of the right knee. Electronically Signed   By: Ashley Royalty M.D.   On: 10/25/2016 22:59   Dg Hip Unilat  With Pelvis 2-3 Views Right  Result Date: 10/25/2016 CLINICAL DATA:  Right hip pain after fall.  Initial encounter. EXAM: DG HIP (WITH OR WITHOUT PELVIS) 2-3V RIGHT COMPARISON:  None. FINDINGS: There is a comminuted subcapital femoral neck fracture on the right which also appears to involve a portion of the inferolateral head. There is mild displacement. There is rotation of the more distal femur. The femoral head remains  seated in the acetabulum. Limited evaluation of the left hip is without evidence of acute osseous abnormality. IMPRESSION: Right femoral neck fracture. Electronically Signed   By: Logan Bores M.D.   On: 10/25/2016 20:07      Scheduled Meds: . acetaminophen  1,000 mg Intravenous To SS-Surg  . atorvastatin  40 mg Oral Daily  .  ceFAZolin (ANCEF) IV  2 g Intravenous To SS-Surg  . hydroxypropyl methylcellulose / hypromellose  2 drop Both Eyes Daily  . insulin aspart  0-5 Units Subcutaneous QHS  . insulin aspart  0-9 Units Subcutaneous TID WC  . latanoprost  1 drop Both Eyes QHS  . lisinopril  40 mg Oral Daily  . methocarbamol (ROBAXIN)  IV  500 mg Intravenous Once  . metoprolol tartrate  25 mg Oral BID  . nicotine  21 mg Transdermal Daily  . pantoprazole  40 mg Oral Daily  . propylthiouracil  50 mg Oral Daily  . tranexamic acid  1,000 mg Intravenous To OR   Continuous Infusions:   LOS: 2 days    Time spent: 40 minutes   Dessa Phi, DO [facility] [email or web address] Password [email or web address] 10/27/2016, 2:10 PM

## 2016-10-27 NOTE — Interval H&P Note (Signed)
History and Physical Interval Note:  10/27/2016 5:50 PM  Shaun Mclaughlin  has presented today for surgery, with the diagnosis of RIGHT HIP FRACTURE  The various methods of treatment have been discussed with the patient and family. After consideration of risks, benefits and other options for treatment, the patient has consented to  Procedure(s): TOTAL HIP ARTHROPLASTY ANTERIOR APPROACH (Right) as a surgical intervention .  The patient's history has been reviewed, patient examined, no change in status, stable for surgery.  I have reviewed the patient's chart and labs.  Questions were answered to the patient's satisfaction.     Nickalaus Crooke, Horald Pollen

## 2016-10-27 NOTE — Progress Notes (Signed)
ANTICOAGULATION CONSULT NOTE - Initial Consult  Pharmacy Consult:  Coumadin Indication: atrial fibrillation  No Known Allergies  Patient Measurements: Height: 5\' 6"  (167.6 cm) Weight: 173 lb 4.5 oz (78.6 kg) IBW/kg (Calculated) : 63.8  Vital Signs: Temp: 97.7 F (36.5 C) (04/03 2144) Temp Source: Oral (04/03 1300) BP: 130/79 (04/03 2144) Pulse Rate: 97 (04/03 2144)  Labs:  Recent Labs  10/25/16 1858 10/25/16 2018 10/26/16 0438 10/27/16 0533  HGB 13.1  --  12.0* 12.6*  HCT 38.9*  --  36.6* 37.3*  PLT 229  --  203 185  LABPROT 32.2*  --  33.2* 19.2*  INR 3.04  --  3.16 1.59  CREATININE  --  1.28* 1.17 1.08    Estimated Creatinine Clearance: 53.8 mL/min (by C-G formula based on SCr of 1.08 mg/dL).   Medical History: Past Medical History:  Diagnosis Date  . Acute on chronic combined systolic and diastolic congestive heart failure, NYHA class 2 ([place]) 06/29/2014  . Arthritis    "maybe a little bit in some joints" (08/04/2012)  . Atherosclerotic renal artery stenosis, unilateral (HCC)    lleft renal artery stenosis by duplex ultrasound  . CAD (coronary artery disease),Hx RCA stenting-patent 06/2012  08/05/2012  . Chronic anticoagulation, coumadin for PAF 08/05/2012  . Chronic bronchitis ([place])    "about q yr" (08/04/2012)  . DM (diabetes mellitus) ([place]) 08/05/2012  . External bleeding hemorrhoids ~ 2003; 2008; 2013   "removed polyps and no bleeding since" (08/04/2012)  . GERD (gastroesophageal reflux disease)   . Hip fracture ([place]) 10/2016   RIGHT FEMORAL NECK   . HTN (hypertension) 08/05/2012  . Hypercholesteremia   . Hyperlipidemia 08/05/2012  . Hyperthyroidism   . Kidney stone 1980's  . PAF (paroxysmal atrial fibrillation), maintaining SR 08/05/2012  . Pneumonia ~ 2008  . PVD (peripheral vascular disease) with claudication, Lt Upper ext. pain, and known 80-90%distal Lt. subclavian artery stenosis  08/05/2012  . S/P angioplasty with stent to Lt. subclavian artery -Nitrinol  stent 08/04/12 08/05/2012  . Type II diabetes mellitus ([place])    "take RX; I'm prediabetic; don't have to  check my CBG qd; keep my weight down" (08/04/2012)      Assessment: 19 YOM presented with right hip pain, now s/p right THA.  Pharmacy consulted to resume Coumadin from PTA for history of AFib.  Noted patient received Vitamin K on 10/25/16.  INR currently sub-therapeutic at 1.59.  No bleeding reported.  Home Coumadin dose:  5mg  daily, last dose 10/24/16   Goal of Therapy:  INR 2-3    Plan:  - Coumadin 7.5mg  PO today - Lovenox 30mg  SQ Q24H per MD.  D/C when INR >/= 1.8. - Daily PT / INR   Hoyt Leanos D. Mina Marble, PharmD, BCPS Pager:  [phone] 10/27/2016, 10:00 PM

## 2016-10-27 NOTE — Progress Notes (Signed)
   Subjective:  Patient reports pain as severe.   Objective:   VITALS:   Vitals:   10/26/16 1629 10/26/16 2016 10/26/16 2131 10/27/16 0448  BP: (!) 153/65  132/63 (!) 153/85  Pulse: 93 (!) 112 96 (!) 104  Resp: 20  18 18   Temp: 98.7 F (37.1 C)  98.9 F (37.2 C) 98 F (36.7 C)  TempSrc:      SpO2: 96%  94% 96%  Weight: 78.6 kg (173 lb 4.5 oz)     Height: 5\' 6"  (1.676 m)       NAD ABD soft Sensation intact distally Intact pulses distally Dorsiflexion/Plantar flexion intact Pain with logroll   Lab Results  Component Value Date   WBC 6.5 10/27/2016   HGB 12.6 (L) 10/27/2016   HCT 37.3 (L) 10/27/2016   MCV 91.6 10/27/2016   PLT 185 10/27/2016   BMET    Component Value Date/Time   NA 134 (L) 10/27/2016 0533   K 4.7 10/27/2016 0533   CL 95 (L) 10/27/2016 0533   CO2 27 10/27/2016 0533   GLUCOSE 132 (H) 10/27/2016 0533   BUN 27 (H) 10/27/2016 0533   CREATININE 1.08 10/27/2016 0533   CREATININE 0.86 09/21/2014 0949   CALCIUM 8.5 (L) 10/27/2016 0533   GFRNONAA >60 10/27/2016 0533   GFRAA >60 10/27/2016 0533     Assessment/Plan:     Principal Problem:   Hip fracture (HCC) Active Problems:   Persistent atrial fibrillation (HCC)   Chronic anticoagulation, coumadin for PAF   DM (diabetes mellitus) (HCC)   HTN (hypertension)   Acute on chronic combined systolic and diastolic congestive heart failure, NYHA class 2 (HCC)   Chronic combined systolic and diastolic heart failure, NYHA class 2 (HCC)   S/P right hip fracture   Hyperthyroidism   INR < 1.6 Proceed with surgery today: R THA Continue NPO Hold anticoagulants Will resume coumadin postop   Elie Goody 10/27/2016, 8:34 AM   Rod Can, MD Cell [phone]

## 2016-10-27 NOTE — H&P (View-Only) (Signed)
ORTHOPAEDIC CONSULTATION  REQUESTING PHYSICIAN: Shon Millet*  PCP:  Gara Kroner, MD  Chief Complaint: Right hip pain  HPI: Shaun Mclaughlin is a 82 y.o. male who complains of  right hip pain after falling and injuring his right hip on 10/22/2016. He has been dependent on a walker since his injury. Normally he does not use any assist devices. He works 4 days per week. He is very active. He takes Coumadin for atrial fibrillation. He denies any additional injuries.  Past Medical History:  Diagnosis Date  . Acute on chronic combined systolic and diastolic congestive heart failure, NYHA class 2 ([place]) 06/29/2014  . Arthritis    "maybe a little bit in some joints" (08/04/2012)  . Atherosclerotic renal artery stenosis, unilateral ([facility])    lleft renal artery stenosis by duplex ultrasound  . CAD (coronary artery disease),Hx RCA stenting-patent 06/2012  08/05/2012  . Chronic anticoagulation, coumadin for PAF 08/05/2012  . Chronic bronchitis ([place])    "about q yr" (08/04/2012)  . DM (diabetes mellitus) ([place]) 08/05/2012  . External bleeding hemorrhoids ~ 2003; 2008; 2013   "removed polyps and no bleeding since" (08/04/2012)  . GERD (gastroesophageal reflux disease)   . Hip fracture ([place]) 10/2016   RIGHT FEMORAL NECK   . HTN (hypertension) 08/05/2012  . Hypercholesteremia   . Hyperlipidemia 08/05/2012  . Hyperthyroidism   . Kidney stone 1980's  . PAF (paroxysmal atrial fibrillation), maintaining SR 08/05/2012  . Pneumonia ~ 2008  . PVD (peripheral vascular disease) with claudication, Lt Upper ext. pain, and known 80-90%distal Lt. subclavian artery stenosis  08/05/2012  . S/P angioplasty with stent to Lt. subclavian artery -Nitrinol stent 08/04/12 08/05/2012  . Type II diabetes mellitus ([place])    "take RX; I'm prediabetic; don't have to  check my CBG qd; keep my weight down" (08/04/2012)   Past Surgical History:  Procedure Laterality Date  . CARDIAC CATHETERIZATION  2008 and Dec 2013   patent coronaries  . CARDIOVERSION N/A 07/13/2014   Procedure: CARDIOVERSION;  Surgeon: Sanda Klein, MD;  Location: [facility];  Service: Cardiovascular;  Laterality: N/A;  . CARDIOVERSION N/A 09/28/2014   Procedure: CARDIOVERSION;  Surgeon: Dorothy Spark, MD;  Location: [facility];  Service: Cardiovascular;  Laterality: N/A;  . CATARACT EXTRACTION W/ INTRAOCULAR LENS IMPLANT     "right eye" (08/04/2012)  . CORONARY ANGIOPLASTY WITH STENT PLACEMENT  2003   patent 12/13  . GLAUCOMA SURGERY  2000's   "right eye; had laser procedure  to lower the pressure and prevent glaucoma" (08/04/2012)  . HERNIA REPAIR  4098'J   "umbilical" (08/04/1476)  . INGUINAL HERNIA REPAIR  2000's   "right" (08/04/2012)  . KIDNEY STONE SURGERY  1980's  . LEFT HEART CATHETERIZATION WITH CORONARY ANGIOGRAM N/A 06/28/2012   Procedure: LEFT HEART CATHETERIZATION WITH CORONARY ANGIOGRAM;  Surgeon: Lorretta Harp, MD;  Location: [facility] CATH LAB;  Service: Cardiovascular;  Laterality: N/A;  . LITHOTRIPSY  1980's   "imploded in my kidney; ended up having to be cut open in my back" (08/04/2012)  . SKIN CANCER EXCISION  2017 & 2018  . SUBCLAVIAN STENT PLACEMENT  Jan 2014   Lt SCA  . TONSILLECTOMY  1940's  . UNILATERAL UPPER EXTREMEITY ANGIOGRAM N/A 08/04/2012   Procedure: UNILATERAL UPPER EXTREMEITY ANGIOGRAM;  Surgeon: Lorretta Harp, MD;  Location: [facility] CATH LAB;  Service: Cardiovascular;  Laterality: N/A;  . UPPER EXTREMITY ANGIOGRAM Bilateral 06/28/2012   Procedure: UPPER EXTREMITY ANGIOGRAM;  Surgeon: Lorretta Harp, MD;  Location: [facility] CATH LAB;  Service: Cardiovascular;  Laterality: Bilateral;   Social History   Social History  . Marital status: Married    Spouse name: N/A  . Number of children: N/A  . Years of education: N/A   Social History Main Topics  . Smoking status: Current Every Day Smoker    Packs/day: 0.50    Years: 25.00    Types: Cigarettes, Pipe  . Smokeless tobacco: Never Used     Comment:  08/04/2012 "still smoke a cigar q once in awhile"  . Alcohol use Yes     Comment: OCCASIONAL  . Drug use: No  . Sexual activity: No   Other Topics Concern  . None   Social History Narrative  . None   Family History  Problem Relation Age of Onset  . Stroke Mother 71  . Hypertension Mother   . Coronary artery disease Father 78   No Known Allergies Prior to Admission medications   Medication Sig Start Date End Date Taking? Authorizing Provider  albuterol (PROVENTIL HFA;VENTOLIN HFA) 108 (90 BASE) MCG/ACT inhaler Inhale 2 puffs into the lungs every 6 (six) hours as needed for wheezing or shortness of breath. 09/28/14  Yes Mihai Croitoru, MD  Ascorbic Acid (VITAMIN C WITH ROSE HIPS) 1000 MG tablet Take 1,000 mg by mouth daily.   Yes Historical Provider, MD  atorvastatin (LIPITOR) 40 MG tablet Take 1 tablet (40 mg total) by mouth daily. 03/20/16  Yes Mihai Croitoru, MD  Brompheniramine-Phenylephrine (RA COLD & ALLERGY) 1-2.5 MG/5ML syrup Take 5 mLs by mouth every 6 (six) hours as needed for cough.   Yes Historical Provider, MD  Capsaicin-Menthol (SALONPAS GEL EX) Apply 1 application topically daily as needed (pain).   Yes Historical Provider, MD  furosemide (LASIX) 40 MG tablet Take 1 tablet (40 mg total) by mouth daily. Patient taking differently: Take 40 mg by mouth daily as needed for edema.  10/09/16  Yes Mihai Croitoru, MD  Hypromellose (GENTEAL MILD) 0.2 % SOLN Apply 2 drops to eye every morning.   Yes Historical Provider, MD  ibuprofen (ADVIL,MOTRIN) 200 MG tablet Take 400 mg by mouth every 6 (six) hours as needed for moderate pain.   Yes Historical Provider, MD  KRILL OIL PO Take 1 capsule by mouth daily.    Yes Historical Provider, MD  latanoprost (XALATAN) 0.005 % ophthalmic solution Place 1 drop into both eyes at bedtime.   Yes Historical Provider, MD  levocetirizine (XYZAL) 5 MG tablet Take 5 mg by mouth daily as needed for allergies.   Yes Historical Provider, MD  lisinopril  (PRINIVIL,ZESTRIL) 40 MG tablet Take 1 tablet (40 mg total) by mouth daily. 03/20/16  Yes Mihai Croitoru, MD  metFORMIN (GLUCOPHAGE-XR) 500 MG 24 hr tablet Take 500 mg by mouth daily with breakfast.   Yes Historical Provider, MD  metoprolol tartrate (LOPRESSOR) 25 MG tablet Take 1 tablet (25 mg total) by mouth 2 (two) times daily. 06/26/16 10/25/16 Yes Mihai Croitoru, MD  Multiple Vitamin (MULTIVITAMIN WITH MINERALS) TABS Take 1 tablet by mouth daily.   Yes Historical Provider, MD  naproxen sodium (ANAPROX) 220 MG tablet Take 220 mg by mouth 2 (two) times daily as needed (PAIN).   Yes Historical Provider, MD  omeprazole (PRILOSEC) 20 MG capsule Take 20 mg by mouth daily as needed (heartburn).    Yes Historical Provider, MD  OVER THE COUNTER MEDICATION Apply 1 application topically daily as needed (SCARRING CREME).   Yes Historical Provider, MD  potassium chloride  SA (K-DUR,KLOR-CON) 20 MEQ tablet Take 1 tablet (20 mEq total) by mouth daily as needed (take with lasix). 08/02/15  Yes Mihai Croitoru, MD  propylthiouracil (PTU) 50 MG tablet Take 50 mg by mouth daily.   Yes Historical Provider, MD  triamcinolone (NASACORT ALLERGY 24HR) 55 MCG/ACT AERO nasal inhaler Place 2 sprays into the nose daily as needed (ALLERGIES).   Yes Historical Provider, MD  trolamine salicylate (ASPERCREME) 10 % cream Apply 1 application topically as needed for muscle pain.   Yes Historical Provider, MD  warfarin (COUMADIN) 5 MG tablet take 1 to 1 and 1/2 tablets by mouth as directed BY COUMADIN CLINIC Patient taking differently: Take 5 mg by mouth daily at 6 PM.  03/20/16  Yes Sanda Klein, MD   Dg Chest 1 View  Result Date: 10/25/2016 CLINICAL DATA:  Right hip pain status post fall. EXAM: CHEST 1 VIEW COMPARISON:  05/17/2015 FINDINGS: The cardiac silhouette remains enlarged. Aortic atherosclerosis is noted. There is a persistent small right pleural effusion with patchy right basilar parenchymal airspace opacity, similar to the  prior study. The left lung is clear. No pneumothorax is seen. A left subclavian region vascular stent is noted. There is mild thoracic spondylosis. IMPRESSION: 1. Small right pleural effusion with right basilar atelectasis versus pneumonia, similar to the prior study. 2. Aortic atherosclerosis. Electronically Signed   By: Logan Bores M.D.   On: 10/25/2016 20:13   Dg Knee 2 Views Right  Result Date: 10/25/2016 CLINICAL DATA:  Right knee pain.  History recent right hip fracture. EXAM: RIGHT KNEE - 1-2 VIEW COMPARISON:  None. FINDINGS: Trace joint effusion. No fracture or malalignment of the right knee. Spurring off the upper pole of the patella. Popliteal arteriosclerosis is noted. Mild osteopenia of the visualized fibula. IMPRESSION: Trace joint effusion. No acute osseous abnormality of the right knee. Electronically Signed   By: Ashley Royalty M.D.   On: 10/25/2016 22:59   Dg Hip Unilat  With Pelvis 2-3 Views Right  Result Date: 10/25/2016 CLINICAL DATA:  Right hip pain after fall.  Initial encounter. EXAM: DG HIP (WITH OR WITHOUT PELVIS) 2-3V RIGHT COMPARISON:  None. FINDINGS: There is a comminuted subcapital femoral neck fracture on the right which also appears to involve a portion of the inferolateral head. There is mild displacement. There is rotation of the more distal femur. The femoral head remains seated in the acetabulum. Limited evaluation of the left hip is without evidence of acute osseous abnormality. IMPRESSION: Right femoral neck fracture. Electronically Signed   By: Logan Bores M.D.   On: 10/25/2016 20:07    Positive ROS: All other systems have been reviewed and were otherwise negative with the exception of those mentioned in the HPI and as above.  Physical Exam: General: Alert, no acute distress Cardiovascular: No pedal edema Respiratory: No cyanosis, no use of accessory musculature GI: No organomegaly, abdomen is soft and non-tender Skin: No lesions in the area of chief  complaint Neurologic: Sensation intact distally Psychiatric: Patient is competent for consent with normal mood and affect Lymphatic: No axillary or cervical lymphadenopathy  MUSCULOSKELETAL: Examination of the right lower extremity reveals that he is shortened and rotated. He has pain with attempted logrolling of the hip. He has some lower extremity edema. He has palpable pulses. He has positive motor function dorsiflexion, plantarflexion, and great toe extension. He reports intact sensation.  Assessment: Displaced right femoral neck fracture, subacute  Plan: I discussed the findings with the patient. Given his high activity  level, I recommend right total hip arthroplasty as the most durable solution to his injury. We discussed the risks, benefits, and alternatives. Please see statement of risk. His INR is going to need to normalize prior to surgery. He already received vitamin K. His Coumadin is being held. We will make him nothing by mouth at midnight and reevaluate labs each morning. All questions were solicited and answered.   The risks, benefits, and alternatives were discussed with the patient. There are risks associated with the surgery including, but not limited to, problems with anesthesia (death), infection, instability (giving out of the joint), dislocation, differences in leg length/angulation/rotation, fracture of bones, loosening or failure of implants, hematoma (blood accumulation) which may require surgical drainage, blood clots, pulmonary embolism, nerve injury (foot drop and lateral thigh numbness), and blood vessel injury. The patient understands these risks and elects to proceed.   Giavanni Odonovan, Horald Pollen, MD Cell [phone]    10/26/2016 3:52 PM

## 2016-10-27 NOTE — Telephone Encounter (Signed)
New message   Pt is calling to let Dr. Sallyanne Kuster know that he is having an operation today. He said he is having a hip replacement because he fractured his hip.

## 2016-10-27 NOTE — Anesthesia Preprocedure Evaluation (Addendum)
Anesthesia Evaluation  Patient identified by MRN, date of birth, ID band Patient awake    Reviewed: Allergy & Precautions, NPO status , Patient's Chart, lab work & pertinent test results, reviewed documented beta blocker date and time   Airway Mallampati: II  TM Distance: <3 FB Neck ROM: Full    Dental  (+) Dental Advisory Given, Missing   Pulmonary COPD,  COPD inhaler, Current Smoker,    Pulmonary exam normal breath sounds clear to auscultation       Cardiovascular hypertension, Pt. on medications and Pt. on home beta blockers + CAD, + Cardiac Stents (RCA stent), + Peripheral Vascular Disease (S/P angioplasty with stent to Lt. subclavian artery -Nitrinol stent 08/04/12) and +CHF  + dysrhythmias Atrial Fibrillation  Rhythm:Irregular Rate:Tachycardia     Neuro/Psych negative neurological ROS  negative psych ROS   GI/Hepatic Neg liver ROS, GERD  Medicated,  Endo/Other  diabetes, Type 2, Oral Hypoglycemic AgentsHyperthyroidism   Renal/GU Renal hypertensionRenal disease     Musculoskeletal  (+) Arthritis ,   Abdominal   Peds  Hematology  (+) Blood dyscrasia (Coumadin), anemia ,   Anesthesia Other Findings Day of surgery medications reviewed with the patient.  Reproductive/Obstetrics                            Anesthesia Physical Anesthesia Plan  ASA: III  Anesthesia Plan: General   Post-op Pain Management:    Induction: Intravenous  Airway Management Planned: Oral ETT and Video Laryngoscope Planned  Additional Equipment:   Intra-op Plan:   Post-operative Plan: Extubation in OR and Possible Post-op intubation/ventilation  Informed Consent: I have reviewed the patients History and Physical, chart, labs and discussed the procedure including the risks, benefits and alternatives for the proposed anesthesia with the patient or authorized representative who has indicated his/her understanding  and acceptance.   Dental advisory given  Plan Discussed with: CRNA  Anesthesia Plan Comments: (Risks/benefits of general anesthesia discussed with patient including risk of damage to teeth, lips, gum, and tongue, nausea/vomiting, allergic reactions to medications, and the possibility of heart attack, stroke and death.  All patient questions answered.  Patient wishes to proceed.)      Anesthesia Quick Evaluation

## 2016-10-27 NOTE — Op Note (Signed)
OPERATIVE REPORT  SURGEON: Rod Can, MD   ASSISTANT: April Green, RNFA.  PREOPERATIVE DIAGNOSIS: Displaced Right femoral neck fracture.   POSTOPERATIVE DIAGNOSIS: Displaced Right femoral neck fracture.   PROCEDURE: Right total hip arthroplasty, anterior approach.   IMPLANTS: DePuy Tri Lock stem, size 5, hi offset. DePuy Pinnacle Cup, size 58 mm. DePuy Altrx liner, size 36 by 58 mm, neutral. DePuy Biolox ceramic head ball, size 36 + 5 mm.  ANESTHESIA:  General  ESTIMATED BLOOD LOSS: 300 mL.  ANTIBIOTICS: 2 g Ancef.  DRAINS: None.  COMPLICATIONS: None.   CONDITION: PACU - hemodynamically stable.   BRIEF CLINICAL NOTE: Shaun Mclaughlin is a 82 y.o. male with a displaced Right femoral neck fracture. He was admitted to the hospitalist service. He takes Coumadin for atrial fibrillation. He was allowed time for his INR to normalize. Due to his high activity level, he was indicated for total hip arthroplasty. The risks, benefits, and alternatives to the procedure were explained, and the patient elected to proceed.  PROCEDURE IN DETAIL: Surgical site was marked by myself in the pre-op holding area. Once inside the operating room, spinal anesthesia was obtained, and a foley catheter was inserted. The patient was then positioned on the Hana table. All bony prominences were well padded. The hip was prepped and draped in the normal sterile surgical fashion. A time-out was called verifying side and site of surgery. The patient received IV antibiotics within 60 minutes of beginning the procedure.  The direct anterior approach to the hip was performed through the Hueter interval. Lateral femoral circumflex vessels were treated with the Auqumantys. The anterior capsule was exposed and an inverted T capsulotomy was made.The fracture hematoma was encountered and evacuated. He was found to have a comminuted subcapital femoral neck fracture. The femoral neck cut was made to the level  of the templated cut. A corkscrew was placed into the head and the head was removed. The head was passed to the back table and was measured.  Acetabular exposure was achieved, and the pulvinar and labrum were excised. Sequental reaming of the acetabulum was then performed up to a size 57 mm reamer. A 58 mm cup was then opened and impacted into place at approximately 40 degrees of abduction and 20 degrees of anteversion. The final polyethylene liner was impacted into place and acetabular osteophytes were removed.   I then gained femoral exposure taking care to protect the abductors and greater trochanter. This was performed using standard external rotation, extension, and adduction. The capsule was peeled off the inner aspect of the greater trochanter, taking care to preserve the short external rotators. A cookie cutter was used to enter the femoral canal, and then the femoral canal finder was placed. Sequential broaching was performed up to a size 5. Calcar planer was used on the femoral neck remnant. I placed a hi offset neck and a trial head ball. The hip was reduced. Leg lengths and offset were checked fluoroscopically. The hip was dislocated and trial components were removed. The final implants were placed, and the hip was reduced.  Fluoroscopy was used to confirm component position and leg lengths. At 90 degrees of external rotation and full extension, the hip was stable to an anterior directed force.  The wound was copiously irrigated with normal saline using pulse lavage. Marcaine solution was injected into the periarticular soft tissue. The wound was closed in layers using #1 Vicryl and V-Loc for the fascia, 2-0 Vicryl for the subcutaneous fat, 2-0 Monocryl for  the deep dermal layer, 3-0 running Monocryl subcuticular stitch, and Dermabond for the skin. Once the glue was fully dried, an Aquacell Ag dressing was applied. The patient was transported to the recovery room in stable  condition. Sponge, needle, and instrument counts were correct at the end of the case x2. The patient tolerated the procedure well and there were no known complications.

## 2016-10-27 NOTE — Anesthesia Procedure Notes (Addendum)
Procedure Name: Intubation Date/Time: 10/27/2016 6:00 PM Performed by: Oletta Lamas Pre-anesthesia Checklist: Patient identified, Emergency Drugs available, Suction available and Patient being monitored Patient Re-evaluated:Patient Re-evaluated prior to inductionOxygen Delivery Method: Circle System Utilized Preoxygenation: Pre-oxygenation with 100% oxygen Intubation Type: IV induction Ventilation: Mask ventilation without difficulty Laryngoscope Size: Glidescope and 4 Grade View: Grade I Tube type: Oral Tube size: 7.5 mm Number of attempts: 1 Airway Equipment and Method: Stylet Placement Confirmation: ETT inserted through vocal cords under direct vision,  positive ETCO2 and breath sounds checked- equal and bilateral Secured at: 24 cm Tube secured with: Tape Dental Injury: Teeth and Oropharynx as per pre-operative assessment

## 2016-10-27 NOTE — Progress Notes (Signed)
Patient has come back from surgery. Alert and oriented with family present.

## 2016-10-27 NOTE — Transfer of Care (Signed)
Immediate Anesthesia Transfer of Care Note  Patient: FLORENCIO HOLLIBAUGH  Procedure(s) Performed: Procedure(s): TOTAL HIP ARTHROPLASTY ANTERIOR APPROACH (Right)  Patient Location: PACU  Anesthesia Type:General  Level of Consciousness: awake  Airway & Oxygen Therapy: Patient Spontanous Breathing and Patient connected to nasal cannula oxygen  Post-op Assessment: Report given to RN and Post -op Vital signs reviewed and stable  Post vital signs: Reviewed and stable  Last Vitals:  Vitals:   10/27/16 1615 10/27/16 2017  BP: (!) 158/76 130/83  Pulse: (!) 103 (!) 131  Resp: (!) 22 (!) 24  Temp:  36.2 C    Last Pain:  Vitals:   10/27/16 1352  TempSrc:   PainSc: 0-No pain      Patients Stated Pain Goal: 4 (99/37/16 9678)  Complications: No apparent anesthesia complications   Drowsy and confused.Marland Kitchen  PACU nurse at bedside.

## 2016-10-28 ENCOUNTER — Encounter (HOSPITAL_COMMUNITY): Payer: Self-pay | Admitting: Orthopedic Surgery

## 2016-10-28 DIAGNOSIS — I481 Persistent atrial fibrillation: Secondary | ICD-10-CM

## 2016-10-28 DIAGNOSIS — S72001A Fracture of unspecified part of neck of right femur, initial encounter for closed fracture: Secondary | ICD-10-CM

## 2016-10-28 DIAGNOSIS — Z7901 Long term (current) use of anticoagulants: Secondary | ICD-10-CM

## 2016-10-28 DIAGNOSIS — Z419 Encounter for procedure for purposes other than remedying health state, unspecified: Secondary | ICD-10-CM

## 2016-10-28 DIAGNOSIS — E1151 Type 2 diabetes mellitus with diabetic peripheral angiopathy without gangrene: Secondary | ICD-10-CM

## 2016-10-28 LAB — CBC
HEMATOCRIT: 28.8 % — AB (ref 39.0–52.0)
HEMOGLOBIN: 9.4 g/dL — AB (ref 13.0–17.0)
MCH: 29.8 pg (ref 26.0–34.0)
MCHC: 32.6 g/dL (ref 30.0–36.0)
MCV: 91.4 fL (ref 78.0–100.0)
PLATELETS: 228 10*3/uL (ref 150–400)
RBC: 3.15 MIL/uL — AB (ref 4.22–5.81)
RDW: 16.2 % — ABNORMAL HIGH (ref 11.5–15.5)
WBC: 13.7 10*3/uL — ABNORMAL HIGH (ref 4.0–10.5)

## 2016-10-28 LAB — GLUCOSE, CAPILLARY
GLUCOSE-CAPILLARY: 179 mg/dL — AB (ref 65–99)
GLUCOSE-CAPILLARY: 171 mg/dL — AB (ref 65–99)
GLUCOSE-CAPILLARY: 224 mg/dL — AB (ref 65–99)
Glucose-Capillary: 209 mg/dL — ABNORMAL HIGH (ref 65–99)

## 2016-10-28 LAB — BASIC METABOLIC PANEL
ANION GAP: 10 (ref 5–15)
BUN: 36 mg/dL — ABNORMAL HIGH (ref 6–20)
CHLORIDE: 96 mmol/L — AB (ref 101–111)
CO2: 28 mmol/L (ref 22–32)
Calcium: 8.2 mg/dL — ABNORMAL LOW (ref 8.9–10.3)
Creatinine, Ser: 1.41 mg/dL — ABNORMAL HIGH (ref 0.61–1.24)
GFR calc non Af Amer: 45 mL/min — ABNORMAL LOW (ref >60)
GFR, EST AFRICAN AMERICAN: 53 mL/min — AB (ref >60)
Glucose, Bld: 187 mg/dL — ABNORMAL HIGH (ref 65–99)
POTASSIUM: 4.6 mmol/L (ref 3.5–5.1)
SODIUM: 134 mmol/L — AB (ref 135–145)

## 2016-10-28 LAB — PROTIME-INR
INR: 1.58
Prothrombin Time: 19 seconds — ABNORMAL HIGH (ref 11.4–15.2)

## 2016-10-28 MED ORDER — WARFARIN SODIUM 7.5 MG PO TABS
7.5000 mg | ORAL_TABLET | Freq: Once | ORAL | Status: AC
  Administered 2016-10-28: 7.5 mg via ORAL
  Filled 2016-10-28: qty 1

## 2016-10-28 MED ORDER — NICOTINE 21 MG/24HR TD PT24
21.0000 mg | MEDICATED_PATCH | Freq: Every day | TRANSDERMAL | Status: DC
  Administered 2016-10-28 – 2016-10-30 (×3): 21 mg via TRANSDERMAL
  Filled 2016-10-28 (×3): qty 1

## 2016-10-28 MED ORDER — SODIUM CHLORIDE 0.9 % IV BOLUS (SEPSIS)
250.0000 mL | Freq: Once | INTRAVENOUS | Status: AC
  Administered 2016-10-28: 250 mL via INTRAVENOUS

## 2016-10-28 NOTE — Evaluation (Signed)
Physical Therapy Evaluation Patient Details Name: Shaun Mclaughlin MRN: [id number] DOB: 1935/07/27 Today's Date: 10/28/2016   History of Present Illness  Pt is an 82 y/o male s/p R THA following a fall which resulted in a R femoral neck fx. PMH includes CHF, Arthritis, DM, HTN, PVD, and afib.   Clinical Impression  Pt is s/p R THA with deficits below. PTA, pt was independent with all mobility and was still working 4 days a week. Upon evaluation, pt presenting with post op pain and weakness and required assist for basic mobility tasks. Pt with good support and home and reports he wants to go home at d/c. Recommending follow up recommendations below. Will continue to follow to maximize functional mobility independence.     Follow Up Recommendations Home health PT;Supervision/Assistance - 24 hour    Equipment Recommendations  Rolling walker with 5" wheels    Recommendations for Other Services       Precautions / Restrictions Precautions Precautions: None Precaution Comments: Reviewed Supine ther ex program with pt.  Restrictions Weight Bearing Restrictions: Yes RLE Weight Bearing: Weight bearing as tolerated      Mobility  Bed Mobility Overal bed mobility: Needs Assistance Bed Mobility: Supine to Sit     Supine to sit: Min guard;HOB elevated     General bed mobility comments: Min guard for steadying when elevating trunk. Pt with use of bed rails and elevated HOB.   Transfers Overall transfer level: Needs assistance Equipment used: Rolling walker (2 wheeled) Transfers: Sit to/from [facility] Sit to Stand: Min assist Stand pivot transfers: Min assist       General transfer comment: Min A for lift assist. Pt able to take a few steps to recliner, requiring min A for steadying. Verbal cues for appropriate UE placement prior to transfer.   Ambulation/Gait                Stairs            Wheelchair Mobility    Modified Rankin (Stroke Patients  Only)       Balance Overall balance assessment: Needs assistance Sitting-balance support: No upper extremity supported;Feet supported Sitting balance-Leahy Scale: Good     Standing balance support: Bilateral upper extremity supported;During functional activity Standing balance-Leahy Scale: Poor Standing balance comment: Reliant on RW for steadying.                              Pertinent Vitals/Pain Pain Assessment: 0-10 Pain Score: 4  Pain Location: R hip and thigh  Pain Descriptors / Indicators: Burning;Aching;Operative site guarding Pain Intervention(s): Limited activity within patient's tolerance;Monitored during session;Repositioned;RN gave pain meds during session    [place] expects to be discharged to:: Private residence Living Arrangements: Spouse/significant other Available Help at Discharge: Family;Available 24 hours/day Type of Home: House Home Access: Stairs to enter Entrance Stairs-Rails: None Entrance Stairs-Number of Steps: 2 Home Layout: One level Home Equipment: Walker - standard;Cane - single point;Bedside commode;Shower seat - built in Additional Comments: Son is able to check in on pt throughout the day     Prior Function Level of Independence: Independent         Comments: Was still working 4 days/wk     Hand Dominance   Dominant Hand: Left    Extremity/Trunk Assessment   Upper Extremity Assessment Upper Extremity Assessment: Defer to OT evaluation    Lower Extremity Assessment Lower Extremity Assessment: RLE deficits/detail  RLE Deficits / Details: Sensory in tact. Deficits consistent with post op pain and weakness. Pt able to perform ankle pumps, quad sets, and hip ab/adduction.     Cervical / Trunk Assessment Cervical / Trunk Assessment: Normal  Communication   Communication: No difficulties  Cognition Arousal/Alertness: Awake/alert Behavior During Therapy: WFL for tasks assessed/performed Overall  Cognitive Status: Within Functional Limits for tasks assessed                                        General Comments General comments (skin integrity, edema, etc.): Reviewed supine HEP with pt. Attempted to perform mobility without oxygen, however, when performing supine ther ex, oxygen sats dropped to 88% on room air. Oxygen reapplied on 2L and oxygen sats at 95%. Pt reportin he feels much better than before surgery.     Exercises Total Joint Exercises Ankle Circles/Pumps: AROM;Right;10 reps;Supine Quad Sets: AROM;Right;10 reps;Supine Hip ABduction/ADduction: AROM;Right;10 reps;Supine   Assessment/Plan    PT Assessment Patient needs continued PT services  PT Problem List Decreased strength;Decreased range of motion;Decreased balance;Decreased mobility;Decreased coordination;Decreased knowledge of use of DME;Decreased knowledge of precautions;Pain       PT Treatment Interventions DME instruction;Gait training;Stair training;Functional mobility training;Therapeutic activities;Therapeutic exercise;Balance training;Neuromuscular re-education;Patient/family education    PT Goals (Current goals can be found in the Care Plan section)  Acute Rehab PT Goals Patient Stated Goal: to return home  PT Goal Formulation: With patient Time For Goal Achievement: 11/04/16 Potential to Achieve Goals: Good    Frequency 7X/week   Barriers to discharge        Co-evaluation               End of Session Equipment Utilized During Treatment: Gait belt;Oxygen Activity Tolerance: Patient tolerated treatment well Patient left: in chair;with call bell/phone within reach Nurse Communication: Mobility status PT Visit Diagnosis: Other abnormalities of gait and mobility (R26.89);Pain Pain - Right/Left: Right Pain - part of body: Hip    Time: 0840-0905 PT Time Calculation (min) (ACUTE ONLY): 25 min   Charges:   PT Evaluation $PT Eval Low Complexity: 1 Procedure PT  Treatments $Therapeutic Exercise: 8-22 mins   PT G Codes:        Nicky Pugh, PT, DPT  Acute Rehabilitation Services  Pager: [phone]   Army Melia 10/28/2016, 9:50 AM

## 2016-10-28 NOTE — Progress Notes (Signed)
   10/28/16 1544  PT Visit Information  Assistance Needed +1  History of Present Illness Pt is an 82 y/o male s/p R THA following a fall which resulted in a R femoral neck fx. PMH includes CHF, Arthritis, DM, HTN, PVD, and afib.   Precautions  Precautions None  Restrictions  Weight Bearing Restrictions Yes  RLE Weight Bearing WBAT  Pain Assessment  Pain Assessment Faces  Faces Pain Scale 2  Pain Location R hip and thigh   Pain Descriptors / Indicators Grimacing (when repositioned R LE)  Pain Intervention(s) Limited activity within patient's tolerance;Monitored during session;Repositioned  Cognition  Arousal/Alertness Awake/alert  Behavior During Therapy WFL for tasks assessed/performed  Overall Cognitive Status Within Functional Limits for tasks assessed   Pt progressing well towards goals. Able to tolerate increased ambulation distance this session, however, reports knee weakness throughout and feels like it is going to "give way". No buckling noted. Attempted session without oxygen, however, pt sats to 88% on room air with seated RLE exercise. Sats remained >90% on 2L for rest of session. Continue to feel recommendations are appropriate. Will continue to follow and progress mobility.   Nicky Pugh, PT, DPT  Acute Rehabilitation Services  Pager: [phone]

## 2016-10-28 NOTE — Progress Notes (Signed)
   Subjective:  Patient reports pain as mild to moderate.  No c/o.  Objective:   VITALS:   Vitals:   10/28/16 0241 10/28/16 0505 10/28/16 1357 10/28/16 2116  BP: 136/81 (!) 145/90 (!) 114/58 (!) 107/48  Pulse: 90 98 100 98  Resp: 16 16 18 16   Temp: 97.6 F (36.4 C) 97.7 F (36.5 C) 98.3 F (36.8 C) 98.3 F (36.8 C)  TempSrc:   Oral Oral  SpO2: 100% 100% 95% 94%  Weight:      Height:       NAD ABD soft Sensation intact distally Intact pulses distally Dorsiflexion/Plantar flexion intact Incision: dressing C/D/I Compartment soft   Lab Results  Component Value Date   WBC 13.7 (H) 10/28/2016   HGB 9.4 (L) 10/28/2016   HCT 28.8 (L) 10/28/2016   MCV 91.4 10/28/2016   PLT 228 10/28/2016   BMET    Component Value Date/Time   NA 134 (L) 10/28/2016 1222   K 4.6 10/28/2016 1222   CL 96 (L) 10/28/2016 1222   CO2 28 10/28/2016 1222   GLUCOSE 187 (H) 10/28/2016 1222   BUN 36 (H) 10/28/2016 1222   CREATININE 1.41 (H) 10/28/2016 1222   CREATININE 0.86 09/21/2014 0949   CALCIUM 8.2 (L) 10/28/2016 1222   GFRNONAA 45 (L) 10/28/2016 1222   GFRAA 53 (L) 10/28/2016 1222    INR 1.58  Assessment/Plan: 1 Day Post-Op   Principal Problem:   Hip fracture (HCC) Active Problems:   Persistent atrial fibrillation (HCC)   Chronic anticoagulation, coumadin for PAF   DM (diabetes mellitus) (HCC)   HTN (hypertension)   Acute on chronic combined systolic and diastolic congestive heart failure, NYHA class 2 (HCC)   Chronic combined systolic and diastolic heart failure, NYHA class 2 (HCC)   S/P right hip fracture   Hyperthyroidism   Displaced fracture of right femoral neck (HCC)   WBAT with walker DVT ppx: coumadin with lovenox bridge. D/C lovenox when INR > 1.8 PO pain control PT/OT Dispo: D/C home with HHPT when medically cleared   Tove Wideman, Horald Pollen 10/28/2016, 10:06 PM   Rod Can, MD Cell [phone]

## 2016-10-28 NOTE — Evaluation (Signed)
Occupational Therapy Evaluation Patient Details Name: Shaun Mclaughlin MRN: [id number] DOB: 07/23/1935 Today's Date: 10/28/2016    History of Present Illness Pt is an 82 y/o male s/p R THA following a fall which resulted in a R femoral neck fx. PMH includes CHF, Arthritis, DM, HTN, PVD, and afib.    Clinical Impression   PTA, pt was independent with ADL and functional mobility. Pt currently requires mod assist for LB ADL and min guard assist for toilet transfers. Educated pt on safety during ADL, use of ice for pain management, and dressing techniques. Pt will have assistance from family post-acute D/C. He would benefit from continued OT services while admitted to improve independence with ADL and functional mobility. No OT follow-up recommended post-acute D/C. Next session to focus on shower transfers.     Follow Up Recommendations  No OT follow up;Supervision/Assistance - 24 hour    Equipment Recommendations  None recommended by OT (Has needs met)    Recommendations for Other Services       Precautions / Restrictions Precautions Precautions: None Precaution Comments: Added seated ther ex to current exercise program  Restrictions Weight Bearing Restrictions: Yes RLE Weight Bearing: Weight bearing as tolerated      Mobility Bed Mobility               General bed mobility comments: In chair upon entry   Transfers Overall transfer level: Needs assistance Equipment used: Rolling walker (2 wheeled) Transfers: Sit to/from Stand Sit to Stand: Min guard         General transfer comment: Min guard for safety during transfer.     Balance Overall balance assessment: Needs assistance Sitting-balance support: No upper extremity supported;Feet supported Sitting balance-Leahy Scale: Good     Standing balance support: Bilateral upper extremity supported;During functional activity Standing balance-Leahy Scale: Fair Standing balance comment: Able to statically stand without  UE support for grooming tasks.                           ADL either performed or assessed with clinical judgement   ADL Overall ADL's : Needs assistance/impaired Eating/Feeding: Set up;Sitting   Grooming: Set up;Sitting   Upper Body Bathing: Set up;Sitting   Lower Body Bathing: Sit to/from stand;Moderate assistance   Upper Body Dressing : Set up;Sitting   Lower Body Dressing: Moderate assistance;Sit to/from stand   Toilet Transfer: Min guard;Ambulation;BSC;RW   Toileting- Water quality scientist and Hygiene: Min guard;Sit to/from stand       Functional mobility during ADLs: Min guard;Rolling walker General ADL Comments: Educated pt on use of ice for pain management, dressing techniques, and safe car transfers.     Vision Patient Visual Report: No change from baseline Vision Assessment?: No apparent visual deficits     Perception     Praxis      Pertinent Vitals/Pain Pain Assessment: Faces Pain Score: 2  Faces Pain Scale: Hurts a little bit Pain Location: R hip and thigh  Pain Descriptors / Indicators: Grimacing (when repositioned R LE) Pain Intervention(s): Limited activity within patient's tolerance;Monitored during session;Repositioned     Hand Dominance Left   Extremity/Trunk Assessment Upper Extremity Assessment Upper Extremity Assessment: Overall WFL for tasks assessed   Lower Extremity Assessment Lower Extremity Assessment: RLE deficits/detail RLE Deficits / Details: Decreased strength and AROM as expected post-operatively.       Communication Communication Communication: No difficulties   Cognition Arousal/Alertness: Awake/alert Behavior During Therapy: WFL for tasks assessed/performed Overall  Cognitive Status: Within Functional Limits for tasks assessed                                     General Comments  Attempted session without oxygen. Pt sats to 88% on RA with seated exercise program. Oxygen reapplied and sats  returned to 94% on 2L. Reviewed exercise program and added seated exercise to current program.     Exercises Exercises: Total Joint Total Joint Exercises Ankle Circles/Pumps: AROM;Both;10 reps;Seated (recliner) Hip ABduction/ADduction: AROM;Right;10 reps;Seated (recliner) Long Arc Quad: AROM;Right;10 reps;Seated   Shoulder Instructions      Home Living Family/patient expects to be discharged to:: Private residence Living Arrangements: Spouse/significant other Available Help at Discharge: Family;Available 24 hours/day Type of Home: House Home Access: Stairs to enter [employer] of Steps: 2 Entrance Stairs-Rails: None Home Layout: One level     Bathroom Shower/Tub: Occupational psychologist: Standard     Home Equipment: Walker - standard;Cane - single point;Bedside commode;Shower seat - built in   Additional Comments: Son is able to check in on pt throughout the day       Prior Functioning/Environment Level of Independence: Independent        Comments: Was still working 4 days/wk        OT Problem List: Decreased strength;Decreased range of motion;Decreased activity tolerance;Impaired balance (sitting and/or standing);Decreased safety awareness;Decreased knowledge of use of DME or AE;Decreased knowledge of precautions;Pain      OT Treatment/Interventions: Self-care/ADL training;Therapeutic exercise;Energy conservation;DME and/or AE instruction;Therapeutic activities;Patient/family education;Balance training    OT Goals(Current goals can be found in the care plan section) Acute Rehab OT Goals Patient Stated Goal: to return home  OT Goal Formulation: With patient Time For Goal Achievement: 11/11/16 Potential to Achieve Goals: Good ADL Goals Pt Will Perform Lower Body Bathing: with modified independence;sit to/from stand Pt Will Perform Lower Body Dressing: with modified independence;sit to/from stand Pt Will Transfer to Toilet: with modified  independence;ambulating;bedside commode (BSC over toilet) Pt Will Perform Toileting - Clothing Manipulation and hygiene: with modified independence;sit to/from stand Pt Will Perform Tub/Shower Transfer: with modified independence;Shower transfer;shower seat;rolling walker;ambulating  OT Frequency: Min 2X/week   Barriers to D/C:            Co-evaluation              End of Session Equipment Utilized During Treatment: Gait belt;Rolling walker Nurse Communication: Mobility status  Activity Tolerance: Patient tolerated treatment well Patient left: in chair;with call bell/phone within reach  OT Visit Diagnosis: Other abnormalities of gait and mobility (R26.89);Pain Pain - Right/Left: Right Pain - part of body: Hip                Time: 9826-4158 OT Time Calculation (min): 33 min Charges:  OT General Charges $OT Visit: 1 Procedure OT Evaluation $OT Eval Moderate Complexity: 1 Procedure OT Treatments $Self Care/Home Management : 8-22 mins G-Codes:     Norman Herrlich, MS OTR/L  Pager: [place] A Hanya Guerin 10/28/2016, 3:51 PM

## 2016-10-28 NOTE — Progress Notes (Signed)
PROGRESS NOTE    Shaun Mclaughlin  ZOX:[id number] DOB: 1935-01-11 DOA: 10/25/2016 PCP: Gara Kroner, MD   Brief Narrative: Shaun Mclaughlin is a 82 y.o. malewith medical history significant of PAFon Coumadin, CAD, DM type II, combined systolic and diastolic CHF. He presented with a right hip fracture.   Assessment & Plan:   Principal Problem:   Hip fracture ([place]) Active Problems:   Persistent atrial fibrillation (HCC)   Chronic anticoagulation, coumadin for PAF   DM (diabetes mellitus) (HCC)   HTN (hypertension)   Acute on chronic combined systolic and diastolic congestive heart failure, NYHA class 2 (HCC)   Chronic combined systolic and diastolic heart failure, NYHA class 2 (HCC)   S/P right hip fracture   Hyperthyroidism   Displaced fracture of right femoral neck (HCC)   Right femoral neck fracture s/p right total hip arthroplasty on 4/3 -orthopedic surgery recommendations -PT/OT recommendations  Paroxysmal atrial fibrillation CHA2DS2-VASc Score is 5. -Coumadin per pharmacy -bridge with Lovenox  Acute kidney injury Baseline creatinine of 0.86. Increased today from 4/3 -NS bolus -oral hydration  Essential hypertension -Continue lisinopril and metoprolol  Diabetes mellitus, type 2 -Continue SSI  Hyperthyroidism -Continue PTU  Hyperlipidemia -Continue atorvastatin  Anemia Post-op. -CBC in AM  Chronic systolic and diastolic CHF Last EF of 81-19% on 07/13/2014. Stable. -Daily weights   DVT prophylaxis: Coumadin, Lovenox Code Status: Full code Family Communication: wife and son at bedside Disposition Plan: Discharge likely to home in 1-2 days   Consultants:   Orthopedic surgery  Procedures:   Right total hip arthroplasty  Antimicrobials:  None    Subjective: Patient reports no hip pain. No chest pain or dyspnea.  Objective: Vitals:   10/27/16 2316 10/27/16 2343 10/28/16 0241 10/28/16 0505  BP: (!) 144/82 114/63 136/81 (!) 145/90    Pulse: 88 83 90 98  Resp: 16 16 16 16   Temp: 97.9 F (36.6 C) 97.9 F (36.6 C) 97.6 F (36.4 C) 97.7 F (36.5 C)  TempSrc:      SpO2: 100% 100% 100% 100%  Weight:      Height:        Intake/Output Summary (Last 24 hours) at 10/28/16 1314 Last data filed at 10/28/16 0848  Gross per 24 hour  Intake             1250 ml  Output              950 ml  Net              300 ml   Filed Weights   10/25/16 1845 10/26/16 1629  Weight: 74.8 kg (165 lb) 78.6 kg (173 lb 4.5 oz)    Examination:  General exam: Appears calm and comfortable Respiratory system: Clear to auscultation. Respiratory effort normal. Cardiovascular system: S1 & S2 heard, RRR. No murmurs, rubs, gallops or clicks. Gastrointestinal system: Abdomen is nondistended, soft and nontender. No organomegaly or masses felt. Normal bowel sounds heard. Central nervous system: Alert and oriented. No focal neurological deficits. Extremities: No edema. No calf tenderness Skin: No cyanosis. No rashes Psychiatry: Judgement and insight appear normal. Mood & affect appropriate.     Data Reviewed: I have personally reviewed following labs and imaging studies  CBC:  Recent Labs Lab 10/25/16 1858 10/26/16 0438 10/27/16 0533 10/28/16 1222  WBC 11.2* 7.5 6.5 13.7*  NEUTROABS 9.7*  --   --   --   HGB 13.1 12.0* 12.6* 9.4*  HCT 38.9* 36.6* 37.3* 28.8*  MCV  90.9 90.8 91.6 91.4  PLT 229 203 185 956   Basic Metabolic Panel:  Recent Labs Lab 10/25/16 2018 10/26/16 0438 10/27/16 0533 10/28/16 1222  NA 131* 135 134* 134*  K 4.3 4.2 4.7 4.6  CL 96* 97* 95* 96*  CO2 22 25 27 28   GLUCOSE 178* 161* 132* 187*  BUN 29* 29* 27* 36*  CREATININE 1.28* 1.17 1.08 1.41*  CALCIUM 8.4* 8.5* 8.5* 8.2*   GFR: Estimated Creatinine Clearance: 41.2 mL/min (A) (by C-G formula based on SCr of 1.41 mg/dL (H)). Liver Function Tests: No results for input(s): AST, ALT, ALKPHOS, BILITOT, PROT, ALBUMIN in the last 168 hours. No results for  input(s): LIPASE, AMYLASE in the last 168 hours. No results for input(s): AMMONIA in the last 168 hours. Coagulation Profile:  Recent Labs Lab 10/25/16 1858 10/26/16 0438 10/27/16 0533 10/28/16 1222  INR 3.04 3.16 1.59 1.58   Cardiac Enzymes: No results for input(s): CKTOTAL, CKMB, CKMBINDEX, TROPONINI in the last 168 hours. BNP (last 3 results) No results for input(s): PROBNP in the last 8760 hours. HbA1C: No results for input(s): HGBA1C in the last 72 hours. CBG:  Recent Labs Lab 10/27/16 1143 10/27/16 1602 10/27/16 2152 10/28/16 0753 10/28/16 1244  GLUCAP 124* 124* 159* 209* 179*   Lipid Profile: No results for input(s): CHOL, HDL, LDLCALC, TRIG, CHOLHDL, LDLDIRECT in the last 72 hours. Thyroid Function Tests:  Recent Labs  10/26/16 0448 10/26/16 1300  TSH  --  1.242  FREET4  --  1.35*  T3FREE 3.1  --    Anemia Panel: No results for input(s): VITAMINB12, FOLATE, FERRITIN, TIBC, IRON, RETICCTPCT in the last 72 hours. Sepsis Labs: No results for input(s): PROCALCITON, LATICACIDVEN in the last 168 hours.  Recent Results (from the past 240 hour(s))  Surgical PCR screen     Status: Abnormal   Collection Time: 10/27/16  1:19 PM  Result Value Ref Range Status   MRSA, PCR NEGATIVE NEGATIVE Final   Staphylococcus aureus POSITIVE (A) NEGATIVE Final    Comment:        The Xpert SA Assay (FDA approved for NASAL specimens in patients over 29 years of age), is one component of a comprehensive surveillance program.  Test performance has been validated by [facility] for patients greater than or equal to 53 year old. It is not intended to diagnose infection nor to guide or monitor treatment.          Radiology Studies: Pelvis Portable  Result Date: 10/27/2016 CLINICAL DATA:  RIGHT total hip arthroplasty. EXAM: OPERATIVE RIGHT HIP (WITH PELVIS IF PERFORMED) AP VIEW PELVIS TECHNIQUE: Fluoroscopic spot image(s) were submitted for interpretation  post-operatively. Single portable AP radiograph of the pelvis. FLUOROSCOPY TIME:  17 seconds COMPARISON:  RIGHT hip radiograph October 25, 2016 FINDINGS: Two fluoroscopic spot views of the RIGHT hip. Status post RIGHT hip total arthroplasty with intact well-seated non cemented hardware. No acute fracture deformity. No dislocation. No destructive bony lesions. RIGHT hip soft tissue swelling and subcutaneous gas consistent with recent surgery. Phleboliths project in the pelvis. IMPRESSION: Status post RIGHT hip total arthroplasty with expected postoperative changes. Electronically Signed   By: Elon Alas M.D.   On: 10/27/2016 21:31   Dg C-arm 61-120 Min  Result Date: 10/27/2016 CLINICAL DATA:  RIGHT total hip arthroplasty. EXAM: OPERATIVE RIGHT HIP (WITH PELVIS IF PERFORMED) AP VIEW PELVIS TECHNIQUE: Fluoroscopic spot image(s) were submitted for interpretation post-operatively. Single portable AP radiograph of the pelvis. FLUOROSCOPY TIME:  17 seconds COMPARISON:  RIGHT hip radiograph October 25, 2016 FINDINGS: Two fluoroscopic spot views of the RIGHT hip. Status post RIGHT hip total arthroplasty with intact well-seated non cemented hardware. No acute fracture deformity. No dislocation. No destructive bony lesions. RIGHT hip soft tissue swelling and subcutaneous gas consistent with recent surgery. Phleboliths project in the pelvis. IMPRESSION: Status post RIGHT hip total arthroplasty with expected postoperative changes. Electronically Signed   By: Elon Alas M.D.   On: 10/27/2016 21:31   Dg Hip Operative Unilat W Or W/o Pelvis Right  Result Date: 10/27/2016 CLINICAL DATA:  RIGHT total hip arthroplasty. EXAM: OPERATIVE RIGHT HIP (WITH PELVIS IF PERFORMED) AP VIEW PELVIS TECHNIQUE: Fluoroscopic spot image(s) were submitted for interpretation post-operatively. Single portable AP radiograph of the pelvis. FLUOROSCOPY TIME:  17 seconds COMPARISON:  RIGHT hip radiograph October 25, 2016 FINDINGS: Two fluoroscopic  spot views of the RIGHT hip. Status post RIGHT hip total arthroplasty with intact well-seated non cemented hardware. No acute fracture deformity. No dislocation. No destructive bony lesions. RIGHT hip soft tissue swelling and subcutaneous gas consistent with recent surgery. Phleboliths project in the pelvis. IMPRESSION: Status post RIGHT hip total arthroplasty with expected postoperative changes. Electronically Signed   By: Elon Alas M.D.   On: 10/27/2016 21:31        Scheduled Meds: . atorvastatin  40 mg Oral Daily  . enoxaparin (LOVENOX) injection  30 mg Subcutaneous Q24H  . hydroxypropyl methylcellulose / hypromellose  2 drop Both Eyes Daily  . insulin aspart  0-5 Units Subcutaneous QHS  . insulin aspart  0-9 Units Subcutaneous TID WC  . latanoprost  1 drop Both Eyes QHS  . lisinopril  40 mg Oral Daily  . methocarbamol (ROBAXIN)  IV  500 mg Intravenous Once  . metoprolol tartrate  25 mg Oral BID  . pantoprazole  40 mg Oral Daily  . propylthiouracil  50 mg Oral Daily  . Warfarin - Pharmacist Dosing Inpatient   Does not apply q1800   Continuous Infusions:   LOS: 3 days     Cordelia Poche, MD [facility] 10/28/2016, 1:14 PM Pager: [phone]  If 7PM-7AM, please contact night-coverage [email or web address] Password [email or web address] 10/28/2016, 1:14 PM

## 2016-10-28 NOTE — Progress Notes (Signed)
ANTICOAGULATION CONSULT NOTE - Follow-up Consult  Pharmacy Consult:  Coumadin Indication: atrial fibrillation  No Known Allergies  Patient Measurements: Height: 5\' 6"  (167.6 cm) Weight: 173 lb 4.5 oz (78.6 kg) IBW/kg (Calculated) : 63.8  Vital Signs: Temp: 97.7 F (36.5 C) (04/04 0505) BP: 145/90 (04/04 0505) Pulse Rate: 98 (04/04 0505)  Labs:  Recent Labs  10/26/16 0438 10/27/16 0533 10/28/16 1222  HGB 12.0* 12.6* 9.4*  HCT 36.6* 37.3* 28.8*  PLT 203 185 228  LABPROT 33.2* 19.2* 19.0*  INR 3.16 1.59 1.58  CREATININE 1.17 1.08 1.41*    Estimated Creatinine Clearance: 41.2 mL/min (A) (by C-G formula based on SCr of 1.41 mg/dL (H)).   Medical History: Past Medical History:  Diagnosis Date  . Acute on chronic combined systolic and diastolic congestive heart failure, NYHA class 2 ([place]) 06/29/2014  . Arthritis    "maybe a little bit in some joints" (08/04/2012)  . Atherosclerotic renal artery stenosis, unilateral (HCC)    lleft renal artery stenosis by duplex ultrasound  . CAD (coronary artery disease),Hx RCA stenting-patent 06/2012  08/05/2012  . Chronic anticoagulation, coumadin for PAF 08/05/2012  . Chronic bronchitis ([place])    "about q yr" (08/04/2012)  . DM (diabetes mellitus) ([place]) 08/05/2012  . External bleeding hemorrhoids ~ 2003; 2008; 2013   "removed polyps and no bleeding since" (08/04/2012)  . GERD (gastroesophageal reflux disease)   . Hip fracture ([place]) 10/2016   RIGHT FEMORAL NECK   . HTN (hypertension) 08/05/2012  . Hypercholesteremia   . Hyperlipidemia 08/05/2012  . Hyperthyroidism   . Kidney stone 1980's  . PAF (paroxysmal atrial fibrillation), maintaining SR 08/05/2012  . Pneumonia ~ 2008  . PVD (peripheral vascular disease) with claudication, Lt Upper ext. pain, and known 80-90%distal Lt. subclavian artery stenosis  08/05/2012  . S/P angioplasty with stent to Lt. subclavian artery -Nitrinol stent 08/04/12 08/05/2012  . Type II diabetes mellitus ([place])    "take RX; I'm prediabetic; don't have to  check my CBG qd; keep my weight down" (08/04/2012)      Assessment: 82 yo M presented with right hip pain, now s/p right THA.  Pharmacy consulted to resume Coumadin from PTA for history of AFib.  Noted patient received Vitamin K on 10/25/16.  INR currently sub-therapeutic at 1.58.  No bleeding reported.  Home Coumadin dose:  5mg  daily, last dose 10/24/16   Goal of Therapy:  INR 2-3    Plan:  - Coumadin 7.5mg  PO today - Lovenox 30mg  SQ Q24H per MD.  D/C when INR >/= 1.8. - Daily PT / INR   [employer], Pharm.D., BCPS Clinical Pharmacist Pager [phone] 10/28/2016 1:54 PM

## 2016-10-29 DIAGNOSIS — I5042 Chronic combined systolic (congestive) and diastolic (congestive) heart failure: Secondary | ICD-10-CM

## 2016-10-29 DIAGNOSIS — E0590 Thyrotoxicosis, unspecified without thyrotoxic crisis or storm: Secondary | ICD-10-CM

## 2016-10-29 LAB — BASIC METABOLIC PANEL
ANION GAP: 9 (ref 5–15)
Anion gap: 11 (ref 5–15)
BUN: 46 mg/dL — ABNORMAL HIGH (ref 6–20)
BUN: 50 mg/dL — AB (ref 6–20)
CALCIUM: 8.1 mg/dL — AB (ref 8.9–10.3)
CHLORIDE: 92 mmol/L — AB (ref 101–111)
CO2: 28 mmol/L (ref 22–32)
CO2: 26 mmol/L (ref 22–32)
Calcium: 8 mg/dL — ABNORMAL LOW (ref 8.9–10.3)
Chloride: 95 mmol/L — ABNORMAL LOW (ref 101–111)
Creatinine, Ser: 1.54 mg/dL — ABNORMAL HIGH (ref 0.61–1.24)
Creatinine, Ser: 1.65 mg/dL — ABNORMAL HIGH (ref 0.61–1.24)
GFR calc Af Amer: 44 mL/min — ABNORMAL LOW (ref >60)
GFR calc non Af Amer: 38 mL/min — ABNORMAL LOW (ref >60)
GFR, EST AFRICAN AMERICAN: 47 mL/min — AB (ref >60)
GFR, EST NON AFRICAN AMERICAN: 41 mL/min — AB (ref >60)
Glucose, Bld: 141 mg/dL — ABNORMAL HIGH (ref 65–99)
Glucose, Bld: 172 mg/dL — ABNORMAL HIGH (ref 65–99)
POTASSIUM: 5 mmol/L (ref 3.5–5.1)
POTASSIUM: 5 mmol/L (ref 3.5–5.1)
SODIUM: 132 mmol/L — AB (ref 135–145)
SODIUM: 129 mmol/L — AB (ref 135–145)

## 2016-10-29 LAB — GLUCOSE, CAPILLARY
GLUCOSE-CAPILLARY: 164 mg/dL — AB (ref 65–99)
Glucose-Capillary: 124 mg/dL — ABNORMAL HIGH (ref 65–99)
Glucose-Capillary: 138 mg/dL — ABNORMAL HIGH (ref 65–99)
Glucose-Capillary: 156 mg/dL — ABNORMAL HIGH (ref 65–99)

## 2016-10-29 LAB — CBC
HCT: 25.7 % — ABNORMAL LOW (ref 39.0–52.0)
Hemoglobin: 8.5 g/dL — ABNORMAL LOW (ref 13.0–17.0)
MCH: 30.6 pg (ref 26.0–34.0)
MCHC: 33.1 g/dL (ref 30.0–36.0)
MCV: 92.4 fL (ref 78.0–100.0)
PLATELETS: 202 10*3/uL (ref 150–400)
RBC: 2.78 MIL/uL — AB (ref 4.22–5.81)
RDW: 16.6 % — AB (ref 11.5–15.5)
WBC: 10.2 10*3/uL (ref 4.0–10.5)

## 2016-10-29 LAB — PROTIME-INR
INR: 1.62
PROTHROMBIN TIME: 19.4 s — AB (ref 11.4–15.2)

## 2016-10-29 MED ORDER — FUROSEMIDE 10 MG/ML IJ SOLN
40.0000 mg | Freq: Once | INTRAMUSCULAR | Status: AC
  Administered 2016-10-29: 40 mg via INTRAVENOUS
  Filled 2016-10-29: qty 4

## 2016-10-29 MED ORDER — POLYETHYLENE GLYCOL 3350 17 G PO PACK
17.0000 g | PACK | Freq: Two times a day (BID) | ORAL | Status: DC
  Administered 2016-10-29 – 2016-10-30 (×3): 17 g via ORAL
  Filled 2016-10-29 (×3): qty 1

## 2016-10-29 MED ORDER — WARFARIN SODIUM 7.5 MG PO TABS
7.5000 mg | ORAL_TABLET | Freq: Once | ORAL | Status: AC
  Administered 2016-10-29: 7.5 mg via ORAL
  Filled 2016-10-29: qty 1

## 2016-10-29 MED ORDER — SODIUM CHLORIDE 0.9 % IV BOLUS (SEPSIS): 500.0000 mL | Freq: Once | INTRAVENOUS | Status: DC

## 2016-10-29 NOTE — Progress Notes (Signed)
PROGRESS NOTE    Shaun Mclaughlin  NID:[id number] DOB: [birth year] DOA: 10/25/2016 PCP: Gara Kroner, MD   Brief Narrative: Shaun Mclaughlin is a 82 y.o. malewith medical history significant of PAFon Coumadin, CAD, DM type II, combined systolic and diastolic CHF. He presented with a right hip fracture.   Assessment & Plan:   Principal Problem:   Hip fracture ([place]) Active Problems:   Persistent atrial fibrillation (HCC)   Chronic anticoagulation, coumadin for PAF   DM (diabetes mellitus) (HCC)   HTN (hypertension)   Acute on chronic combined systolic and diastolic congestive heart failure, NYHA class 2 (HCC)   Chronic combined systolic and diastolic heart failure, NYHA class 2 (HCC)   S/P right hip fracture   Hyperthyroidism   Displaced fracture of right femoral neck (HCC)   Right femoral neck fracture s/p right total hip arthroplasty on 4/3 -orthopedic surgery recommendations -PT/OT recommendations: home health PT  Paroxysmal atrial fibrillation CHA2DS2-VASc Score is 5. INR of 1.62 -Coumadin per pharmacy -bridge with Lovenox  Acute kidney injury Baseline creatinine of 0.86. Continues to worsen -lasix 40mg  x1 -oral hydration  Essential hypertension -Continue metoprolol -Hold lisinopril  Diabetes mellitus, type 2 -Continue SSI  Hyperthyroidism -Continue PTU  Hyperlipidemia -Continue atorvastatin  Anemia Post-op. Continues to downtrend -CBC in AM  Chronic systolic and diastolic CHF Last EF of 14-43% on 07/13/2014. Stable. -Daily weights   DVT prophylaxis: Coumadin, Lovenox Code Status: Full code Family Communication: none at bedside Disposition Plan: Discharge likely to home in 1 day   Consultants:   Orthopedic surgery  Procedures:   Right total hip arthroplasty  Antimicrobials:  None    Subjective: No hip pain. No lightheadedness. No chest pain or dyspnea.  Objective: Vitals:   10/28/16 2116 10/29/16 0500 10/29/16 0629 10/29/16  0919  BP: (!) 107/48 (!) 97/52 (!) 108/54 102/60  Pulse: 98 87 97   Resp: 16 16    Temp: 98.3 F (36.8 C) 98 F (36.7 C)    TempSrc: Oral Oral    SpO2: 94% 97%    Weight:      Height:        Intake/Output Summary (Last 24 hours) at 10/29/16 1207 Last data filed at 10/29/16 0900  Gross per 24 hour  Intake              600 ml  Output              675 ml  Net              -75 ml   Filed Weights   10/25/16 1845 10/26/16 1629  Weight: 74.8 kg (165 lb) 78.6 kg (173 lb 4.5 oz)    Examination:  General exam: Appears calm and comfortable Respiratory system: Clear to auscultation. Respiratory effort normal. Cardiovascular system: S1 & S2 heard, RRR. No murmurs Gastrointestinal system: Abdomen is nondistended, soft and nontender.  Normal bowel sounds heard. Central nervous system: Alert and oriented. No focal neurological deficits. Extremities: No edema. No calf tenderness Skin: No cyanosis. No rashes Psychiatry: Judgement and insight appear normal. Mood & affect appropriate.     Data Reviewed: I have personally reviewed following labs and imaging studies  CBC:  Recent Labs Lab 10/25/16 1858 10/26/16 0438 10/27/16 0533 10/28/16 1222 10/29/16 0511  WBC 11.2* 7.5 6.5 13.7* 10.2  NEUTROABS 9.7*  --   --   --   --   HGB 13.1 12.0* 12.6* 9.4* 8.5*  HCT 38.9* 36.6* 37.3* 28.8* 25.7*  MCV 90.9 90.8 91.6 91.4 92.4  PLT 229 203 185 228 098   Basic Metabolic Panel:  Recent Labs Lab 10/25/16 2018 10/26/16 0438 10/27/16 0533 10/28/16 1222 10/29/16 0511  NA 131* 135 134* 134* 132*  K 4.3 4.2 4.7 4.6 5.0  CL 96* 97* 95* 96* 95*  CO2 22 25 27 28 28   GLUCOSE 178* 161* 132* 187* 141*  BUN 29* 29* 27* 36* 46*  CREATININE 1.28* 1.17 1.08 1.41* 1.54*  CALCIUM 8.4* 8.5* 8.5* 8.2* 8.1*   GFR: Estimated Creatinine Clearance: 37.7 mL/min (A) (by C-G formula based on SCr of 1.54 mg/dL (H)). Liver Function Tests: No results for input(s): AST, ALT, ALKPHOS, BILITOT, PROT,  ALBUMIN in the last 168 hours. No results for input(s): LIPASE, AMYLASE in the last 168 hours. No results for input(s): AMMONIA in the last 168 hours. Coagulation Profile:  Recent Labs Lab 10/25/16 1858 10/26/16 0438 10/27/16 0533 10/28/16 1222 10/29/16 0511  INR 3.04 3.16 1.59 1.58 1.62   Cardiac Enzymes: No results for input(s): CKTOTAL, CKMB, CKMBINDEX, TROPONINI in the last 168 hours. BNP (last 3 results) No results for input(s): PROBNP in the last 8760 hours. HbA1C: No results for input(s): HGBA1C in the last 72 hours. CBG:  Recent Labs Lab 10/28/16 0753 10/28/16 1244 10/28/16 1655 10/28/16 2112 10/29/16 0820  GLUCAP 209* 179* 171* 224* 124*   Lipid Profile: No results for input(s): CHOL, HDL, LDLCALC, TRIG, CHOLHDL, LDLDIRECT in the last 72 hours. Thyroid Function Tests:  Recent Labs  10/26/16 1300  TSH 1.242  FREET4 1.35*   Anemia Panel: No results for input(s): VITAMINB12, FOLATE, FERRITIN, TIBC, IRON, RETICCTPCT in the last 72 hours. Sepsis Labs: No results for input(s): PROCALCITON, LATICACIDVEN in the last 168 hours.  Recent Results (from the past 240 hour(s))  Surgical PCR screen     Status: Abnormal   Collection Time: 10/27/16  1:19 PM  Result Value Ref Range Status   MRSA, PCR NEGATIVE NEGATIVE Final   Staphylococcus aureus POSITIVE (A) NEGATIVE Final    Comment:        The Xpert SA Assay (FDA approved for NASAL specimens in patients over 21 years of age), is one component of a comprehensive surveillance program.  Test performance has been validated by [facility] for patients greater than or equal to 90 year old. It is not intended to diagnose infection nor to guide or monitor treatment.          Radiology Studies: Pelvis Portable  Result Date: 10/27/2016 CLINICAL DATA:  RIGHT total hip arthroplasty. EXAM: OPERATIVE RIGHT HIP (WITH PELVIS IF PERFORMED) AP VIEW PELVIS TECHNIQUE: Fluoroscopic spot image(s) were submitted for  interpretation post-operatively. Single portable AP radiograph of the pelvis. FLUOROSCOPY TIME:  17 seconds COMPARISON:  RIGHT hip radiograph October 25, 2016 FINDINGS: Two fluoroscopic spot views of the RIGHT hip. Status post RIGHT hip total arthroplasty with intact well-seated non cemented hardware. No acute fracture deformity. No dislocation. No destructive bony lesions. RIGHT hip soft tissue swelling and subcutaneous gas consistent with recent surgery. Phleboliths project in the pelvis. IMPRESSION: Status post RIGHT hip total arthroplasty with expected postoperative changes. Electronically Signed   By: Elon Alas M.D.   On: 10/27/2016 21:31   Dg C-arm 61-120 Min  Result Date: 10/27/2016 CLINICAL DATA:  RIGHT total hip arthroplasty. EXAM: OPERATIVE RIGHT HIP (WITH PELVIS IF PERFORMED) AP VIEW PELVIS TECHNIQUE: Fluoroscopic spot image(s) were submitted for interpretation post-operatively. Single portable AP radiograph of the pelvis. FLUOROSCOPY TIME:  17  seconds COMPARISON:  RIGHT hip radiograph October 25, 2016 FINDINGS: Two fluoroscopic spot views of the RIGHT hip. Status post RIGHT hip total arthroplasty with intact well-seated non cemented hardware. No acute fracture deformity. No dislocation. No destructive bony lesions. RIGHT hip soft tissue swelling and subcutaneous gas consistent with recent surgery. Phleboliths project in the pelvis. IMPRESSION: Status post RIGHT hip total arthroplasty with expected postoperative changes. Electronically Signed   By: Elon Alas M.D.   On: 10/27/2016 21:31   Dg Hip Operative Unilat W Or W/o Pelvis Right  Result Date: 10/27/2016 CLINICAL DATA:  RIGHT total hip arthroplasty. EXAM: OPERATIVE RIGHT HIP (WITH PELVIS IF PERFORMED) AP VIEW PELVIS TECHNIQUE: Fluoroscopic spot image(s) were submitted for interpretation post-operatively. Single portable AP radiograph of the pelvis. FLUOROSCOPY TIME:  17 seconds COMPARISON:  RIGHT hip radiograph October 25, 2016 FINDINGS:  Two fluoroscopic spot views of the RIGHT hip. Status post RIGHT hip total arthroplasty with intact well-seated non cemented hardware. No acute fracture deformity. No dislocation. No destructive bony lesions. RIGHT hip soft tissue swelling and subcutaneous gas consistent with recent surgery. Phleboliths project in the pelvis. IMPRESSION: Status post RIGHT hip total arthroplasty with expected postoperative changes. Electronically Signed   By: Elon Alas M.D.   On: 10/27/2016 21:31        Scheduled Meds: . atorvastatin  40 mg Oral Daily  . enoxaparin (LOVENOX) injection  30 mg Subcutaneous Q24H  . furosemide  40 mg Intravenous Once  . hydroxypropyl methylcellulose / hypromellose  2 drop Both Eyes Daily  . insulin aspart  0-5 Units Subcutaneous QHS  . insulin aspart  0-9 Units Subcutaneous TID WC  . latanoprost  1 drop Both Eyes QHS  . lisinopril  40 mg Oral Daily  . methocarbamol (ROBAXIN)  IV  500 mg Intravenous Once  . metoprolol tartrate  25 mg Oral BID  . nicotine  21 mg Transdermal Daily  . pantoprazole  40 mg Oral Daily  . propylthiouracil  50 mg Oral Daily  . Warfarin - Pharmacist Dosing Inpatient   Does not apply q1800   Continuous Infusions:   LOS: 4 days     Cordelia Poche, MD [facility] 10/29/2016, 12:07 PM Pager: [phone]  If 7PM-7AM, please contact night-coverage [email or web address] Password [facility] 10/29/2016, 12:07 PM

## 2016-10-29 NOTE — Progress Notes (Signed)
ANTICOAGULATION CONSULT NOTE - Follow-up Consult  Pharmacy Consult:  Coumadin Indication: atrial fibrillation  No Known Allergies  Patient Measurements: Height: 5\' 6"  (167.6 cm) Weight: 173 lb 4.5 oz (78.6 kg) IBW/kg (Calculated) : 63.8  Vital Signs: Temp: 98 F (36.7 C) (04/05 0500) Temp Source: Oral (04/05 0500) BP: 102/60 (04/05 0919) Pulse Rate: 97 (04/05 0629)  Labs:  Recent Labs  10/27/16 0533 10/28/16 1222 10/29/16 0511  HGB 12.6* 9.4* 8.5*  HCT 37.3* 28.8* 25.7*  PLT 185 228 202  LABPROT 19.2* 19.0* 19.4*  INR 1.59 1.58 1.62  CREATININE 1.08 1.41* 1.54*    Estimated Creatinine Clearance: 37.7 mL/min (A) (by C-G formula based on SCr of 1.54 mg/dL (H)).   Medical History: Past Medical History:  Diagnosis Date  . Acute on chronic combined systolic and diastolic congestive heart failure, NYHA class 2 ([place]) 06/29/2014  . Arthritis    "maybe a little bit in some joints" (08/04/2012)  . Atherosclerotic renal artery stenosis, unilateral (HCC)    lleft renal artery stenosis by duplex ultrasound  . CAD (coronary artery disease),Hx RCA stenting-patent 06/2012  08/05/2012  . Chronic anticoagulation, coumadin for PAF 08/05/2012  . Chronic bronchitis ([place])    "about q yr" (08/04/2012)  . DM (diabetes mellitus) ([place]) 08/05/2012  . External bleeding hemorrhoids ~ 2003; 2008; 2013   "removed polyps and no bleeding since" (08/04/2012)  . GERD (gastroesophageal reflux disease)   . Hip fracture ([place]) 10/2016   RIGHT FEMORAL NECK   . HTN (hypertension) 08/05/2012  . Hypercholesteremia   . Hyperlipidemia 08/05/2012  . Hyperthyroidism   . Kidney stone 1980's  . PAF (paroxysmal atrial fibrillation), maintaining SR 08/05/2012  . Pneumonia ~ 2008  . PVD (peripheral vascular disease) with claudication, Lt Upper ext. pain, and known 80-90%distal Lt. subclavian artery stenosis  08/05/2012  . S/P angioplasty with stent to Lt. subclavian artery -Nitrinol stent 08/04/12 08/05/2012  . Type II  diabetes mellitus ([place])    "take RX; I'm prediabetic; don't have to  check my CBG qd; keep my weight down" (08/04/2012)      Assessment: 82 yo M presented with right hip pain, now s/p right THA.  Pharmacy consulted to resume Coumadin from PTA for history of AFib.  Noted patient received Vitamin K on 10/25/16.  INR sub-therapeutic but trending up.  No bleeding reported.  Home Coumadin dose:  5mg  daily, last dose 10/24/16  Goal of Therapy:  INR 2-3    Plan:  - Repeat Coumadin 7.5mg  PO today - Lovenox 30mg  SQ Q24H per MD.  D/C when INR >/= 1.8. - Daily PT / INR   [employer], Pharm.D., BCPS Clinical Pharmacist Pager [phone] 10/29/2016 1:57 PM

## 2016-10-29 NOTE — Progress Notes (Signed)
10/29/16 1600  PT Visit Information  Last PT Received On 10/29/16  Assistance Needed +1  History of Present Illness Pt is an 82 y/o male s/p R THA following a fall which resulted in a R femoral neck fx. PMH includes CHF, Arthritis, DM, HTN, PVD, and afib.   Subjective Data  Patient Stated Goal to return home   Precautions  Precautions None  Restrictions  Weight Bearing Restrictions Yes  RLE Weight Bearing WBAT  Pain Assessment  Pain Assessment Faces  Faces Pain Scale 2  Pain Location R hip and knee  Pain Descriptors / Indicators Sore  Pain Intervention(s) Limited activity within patient's tolerance;Monitored during session;Repositioned  Cognition  Arousal/Alertness Awake/alert  Behavior During Therapy WFL for tasks assessed/performed  Overall Cognitive Status Within Functional Limits for tasks assessed  Bed Mobility  General bed mobility comments Pt OOB upon arrival  Transfers  Overall transfer level Needs assistance  Equipment used Rolling walker (2 wheeled)  Transfers Sit to/from Stand  Sit to Stand Min guard  General transfer comment Min guard with RW for safety.   Ambulation/Gait  Ambulation/Gait assistance Min assist;Min guard  Ambulation Distance (Feet) 100 Feet  Assistive device Rolling walker (2 wheeled)  Gait Pattern/deviations Step-to pattern;Decreased stance time - right;Decreased weight shift to right;Antalgic (knee buckling )  General Gait Details Pt exhibiting increased knee buckling this session and required min A for steadying. Multiple cues for standing rest breaks secondary to increased fatigue. Pt with increased SOB, however, oxygen >90% on RA.   Gait velocity Decreased  Gait velocity interpretation Below normal speed for age/gender  Stairs Yes  Stairs assistance Mod assist  Stair Management With walker;With cane;Forwards;Step to pattern  Number of Stairs 1  General stair comments Attempted stair training with use of cane and HHA. Currently unsafe  secondary to R knee buckling. Performed one threshold step with use of RW and pt required mod A to maintain balance and for lift assist. Pt educated on current assist level and about rehab, however, pt reporting he does not want to go and his sons have helped him navigate stairs before. Educated pt about appropriate method of assist from sons.   Balance  Overall balance assessment Needs assistance  Sitting-balance support No upper extremity supported;Feet supported  Sitting balance-Leahy Scale Good  Standing balance support Bilateral upper extremity supported;During functional activity  Standing balance-Leahy Scale Poor  Standing balance comment reliant on RW for stability   PT - End of Session  Equipment Utilized During Treatment Gait belt  Activity Tolerance Patient limited by fatigue  Patient left in chair;with call bell/phone within reach  Nurse Communication Mobility status  PT - Assessment/Plan  PT Plan Current plan remains appropriate  PT Visit Diagnosis Other abnormalities of gait and mobility (R26.89);Pain  Pain - Right/Left Right  Pain - part of body Hip  PT Frequency (ACUTE ONLY) 7X/week  Follow Up Recommendations Home health PT;Supervision/Assistance - 24 hour  PT equipment Rolling walker with 5" wheels  AM-PAC PT "6 Clicks" Daily Activity Outcome Measure  Difficulty turning over in bed (including adjusting bedclothes, sheets and blankets)? 3  Difficulty moving from lying on back to sitting on the side of the bed?  3  Difficulty sitting down on and standing up from a chair with arms (e.g., wheelchair, bedside commode, etc,.)? 3  Help needed moving to and from a bed to chair (including a wheelchair)? 3  Help needed walking in hospital room? 3  Help needed climbing 3-5 steps with a railing?  3  6 Click Score 18  Mobility G Code  CK  PT Goal Progression  Progress towards PT goals Progressing toward goals  Acute Rehab PT Goals  PT Goal Formulation With patient  Time For Goal  Achievement 11/04/16  Potential to Achieve Goals Good  PT Time Calculation  PT Start Time (ACUTE ONLY) 1453  PT Stop Time (ACUTE ONLY) 1519  PT Time Calculation (min) (ACUTE ONLY) 26 min  PT General Charges  $$ ACUTE PT VISIT 1 Procedure  PT Treatments  $Gait Training 23-37 mins    Pt very limited by R knee instability during afternoon session. Pt limited in stair training and requiring mod A with use of RW on small threshold step for steadying and lift assist. Pt limited in ambulation distance secondary to fatigue and R knee buckling. Multiple standing rest breaks required. Educated about rehab, however, pt reporting he does not want to go and that his sons have helped him with stair navigation before. Educated pt about appropriate assist level required for stair management from sons and to discuss with them if they will be able to provide. Pt will benefit from continued progression of mobility.   Nicky Pugh, PT, DPT  Acute Rehabilitation Services  Pager: [phone]

## 2016-10-29 NOTE — Care Management Important Message (Signed)
Important Message  Patient Details  Name: Shaun Mclaughlin MRN: [id number] Date of Birth: Dec 15, 1934   Medicare Important Message Given:  Yes    Orbie Pyo 10/29/2016, 11:37 AM

## 2016-10-29 NOTE — Progress Notes (Signed)
Occupational Therapy Treatment Patient Details Name: Shaun Mclaughlin MRN: [id number] DOB: 03-08-1935 Today's Date: 10/29/2016    History of present illness Pt is an 82 y/o male s/p R THA following a fall which resulted in a R femoral neck fx. PMH includes CHF, Arthritis, DM, HTN, PVD, and afib.    OT comments  Pt making good progress toward OT goals. Pt able to perform simulated shower transfer and toilet transfer with supervision; educated pt and family on proper technique. D/c plan remains appropriate. Will continue to follow acutely.   Follow Up Recommendations  No OT follow up;Supervision/Assistance - 24 hour    Equipment Recommendations  None recommended by OT    Recommendations for Other Services      Precautions / Restrictions Precautions Precautions: None Precaution Comments:  Restrictions Weight Bearing Restrictions: Yes RLE Weight Bearing: Weight bearing as tolerated       Mobility Bed Mobility      General bed mobility comments: Pt OOB upon arrival  Transfers Overall transfer level: Needs assistance Equipment used: Rolling walker (2 wheeled) Transfers: Sit to/from Stand Sit to Stand: Supervision         General transfer comment: Supervision for safety; no physical assist. Good hand placement and technique    Balance Overall balance assessment: Needs assistance Sitting-balance support: No upper extremity supported;Feet supported Sitting balance-Leahy Scale: Good     Standing balance support: No upper extremity supported;During functional activity Standing balance-Leahy Scale: Fair Standing balance comment: reliant on RW for stability                            ADL either performed or assessed with clinical judgement   ADL Overall ADL's : Needs assistance/impaired                 Upper Body Dressing : Supervision/safety;Standing       Toilet Transfer: Supervision/safety;Ambulation;BSC;RW Toilet Transfer Details (indicate cue  type and reason): Educated pts family on set up of 3 in 1 over toilet and transfer technique simulated by sit<>stand from chair     Tub/ Shower Transfer: Supervision/safety;Walk-in shower;Ambulation;Rolling walker Tub/Shower Transfer Details (indicate cue type and reason): Educated on shower transfer technique, pt able to return demo Functional mobility during ADLs: Supervision/safety;Rolling walker       Vision       Perception     Praxis      Cognition Arousal/Alertness: Awake/alert Behavior During Therapy: WFL for tasks assessed/performed Overall Cognitive Status: Within Functional Limits for tasks assessed                                          Exercises    Shoulder Instructions       General Comments     Pertinent Vitals/ Pain       Pain Assessment: Faces Faces Pain Scale: Hurts little more Pain Location: R hip and knee Pain Descriptors / Indicators: Sore Pain Intervention(s): Monitored during session;Repositioned  Home Living                                          Prior Functioning/Environment              Frequency  Min 2X/week  Progress Toward Goals  OT Goals(current goals can now be found in the care plan section)  Progress towards OT goals: Progressing toward goals  Acute Rehab OT Goals Patient Stated Goal: to return home  OT Goal Formulation: With patient/family  Plan Discharge plan remains appropriate    Co-evaluation                 End of Session Equipment Utilized During Treatment: Rolling walker  OT Visit Diagnosis: Other abnormalities of gait and mobility (R26.89);Pain Pain - Right/Left: Right Pain - part of body: Hip   Activity Tolerance Patient tolerated treatment well   Patient Left in chair;with call bell/phone within reach;with family/visitor present   Nurse Communication          Time: 4034-7425 OT Time Calculation (min): 20 min  Charges: OT General  Charges $OT Visit: 1 Procedure OT Treatments $Self Care/Home Management : 8-22 mins  Idalia Allbritton A. Ulice Brilliant, M.S., OTR/L Pager: Welcome 10/29/2016, 1:33 PM

## 2016-10-29 NOTE — Progress Notes (Signed)
qPhysical Therapy Treatment Patient Details Name: Shaun Mclaughlin MRN: [id number] DOB: [birth year] Today's Date: 10/29/2016    History of Present Illness Pt is an 82 y/o male s/p R THA following a fall which resulted in a R femoral neck fx. PMH includes CHF, Arthritis, DM, HTN, PVD, and afib.     PT Comments    Pt continues to progress well with PT. Increased ambulation distance this session, however, noted some knee buckling towards end of gait training. Required multiple standing rest breaks throughout secondary to fatigue.  Pt exhibited no LOB. Performed session on RA and sats >90%. Notified RN and RN cleared PT to leave oxygen off of pt. Continue to recommend follow up recommendations below. Feel pt will benefit from further progression of gait training and stair training with PT to increase safety and independence with mobility. Will continue to follow.   Follow Up Recommendations  Home health PT;Supervision/Assistance - 24 hour     Equipment Recommendations  Rolling walker with 5" wheels    Recommendations for Other Services       Precautions / Restrictions Precautions Precautions: None Precaution Comments: Progressed supine HEP this session Restrictions Weight Bearing Restrictions: Yes RLE Weight Bearing: Weight bearing as tolerated    Mobility  Bed Mobility Overal bed mobility: Needs Assistance Bed Mobility: Supine to Sit     Supine to sit: Supervision;HOB elevated     General bed mobility comments: Pt reporting he has elevating HOB feature at home, so allowed pt to use elevated HOB. Supervision for safety.   Transfers Overall transfer level: Needs assistance Equipment used: Rolling walker (2 wheeled) Transfers: Sit to/from Stand Sit to Stand: Min guard         General transfer comment: Min guard for safety during transfer. Pt requiring increased time this session secondary to post op pain and weakness.   Ambulation/Gait Ambulation/Gait assistance: Min  guard Ambulation Distance (Feet): 125 Feet Assistive device: Rolling walker (2 wheeled) Gait Pattern/deviations: Step-to pattern;Decreased stance time - right;Decreased weight shift to right;Antalgic Gait velocity: Decreased Gait velocity interpretation: Below normal speed for age/gender General Gait Details: Pt continues to report that his R knee feels unsteady. Noted towards end of gait training with fatigue R knee buckling, however, pt not exhibiting LOB. More reliance on UE on RW this session. Pt requiring multiple standing rest breaks throughout secondary to fatigue.    Stairs            Wheelchair Mobility    Modified Rankin (Stroke Patients Only)       Balance Overall balance assessment: Needs assistance Sitting-balance support: No upper extremity supported;Feet supported Sitting balance-Leahy Scale: Good     Standing balance support: Bilateral upper extremity supported;During functional activity Standing balance-Leahy Scale: Poor Standing balance comment: reliant on RW for stability                             Cognition Arousal/Alertness: Awake/alert Behavior During Therapy: WFL for tasks assessed/performed Overall Cognitive Status: Within Functional Limits for tasks assessed                                        Exercises Total Joint Exercises Ankle Circles/Pumps: AROM;Both;15 reps;Supine Heel Slides: AROM;Right;10 reps;Supine Marching in Standing: AROM;Both;10 reps;Standing (RW)    General Comments General comments (skin integrity, edema, etc.): Added supine exercise to current HEP.  Pt educated on benefits of quitting smoking, and pt agreeable. Performed session without oxygen and sats remained >90% throughout all mobility. Sats at 99% when leaving. Reviewed current recommendations and progress. Pt not wanting to go to rehab, but educated to alert PT if he feels like increased support is required. Pt currently progressing well with  PT.       Pertinent Vitals/Pain Pain Assessment: Faces Faces Pain Scale: Hurts a little bit Pain Location: R hip and thigh  Pain Descriptors / Indicators: Grimacing Pain Intervention(s): Limited activity within patient's tolerance;Monitored during session;Repositioned    Home Living                      Prior Function            PT Goals (current goals can now be found in the care plan section) Acute Rehab PT Goals Patient Stated Goal: to return home  PT Goal Formulation: With patient Time For Goal Achievement: 11/04/16 Potential to Achieve Goals: Good Progress towards PT goals: Progressing toward goals    Frequency    7X/week      PT Plan Current plan remains appropriate    Co-evaluation             End of Session Equipment Utilized During Treatment: Gait belt Activity Tolerance: Patient tolerated treatment well Patient left: in chair;with call bell/phone within reach Nurse Communication: Mobility status PT Visit Diagnosis: Other abnormalities of gait and mobility (R26.89);Pain Pain - Right/Left: Right Pain - part of body: Hip     Time: 0950-1020 PT Time Calculation (min) (ACUTE ONLY): 30 min  Charges:  $Gait Training: 8-22 mins $Therapeutic Exercise: 8-22 mins                    G Codes:       Shaun Mclaughlin, PT, DPT  Acute Rehabilitation Services  Pager: [phone]    Shaun Mclaughlin 10/29/2016, 11:18 AM

## 2016-10-30 DIAGNOSIS — I10 Essential (primary) hypertension: Secondary | ICD-10-CM

## 2016-10-30 DIAGNOSIS — I5043 Acute on chronic combined systolic (congestive) and diastolic (congestive) heart failure: Secondary | ICD-10-CM

## 2016-10-30 LAB — BASIC METABOLIC PANEL
Anion gap: 10 (ref 5–15)
BUN: 46 mg/dL — AB (ref 6–20)
CHLORIDE: 95 mmol/L — AB (ref 101–111)
CO2: 28 mmol/L (ref 22–32)
CREATININE: 1.31 mg/dL — AB (ref 0.61–1.24)
Calcium: 8.2 mg/dL — ABNORMAL LOW (ref 8.9–10.3)
GFR calc non Af Amer: 50 mL/min — ABNORMAL LOW (ref >60)
GFR, EST AFRICAN AMERICAN: 58 mL/min — AB (ref >60)
GLUCOSE: 135 mg/dL — AB (ref 65–99)
Potassium: 4.2 mmol/L (ref 3.5–5.1)
Sodium: 133 mmol/L — ABNORMAL LOW (ref 135–145)

## 2016-10-30 LAB — GLUCOSE, CAPILLARY
Glucose-Capillary: 128 mg/dL — ABNORMAL HIGH (ref 65–99)
Glucose-Capillary: 175 mg/dL — ABNORMAL HIGH (ref 65–99)

## 2016-10-30 LAB — CBC
HCT: 25.8 % — ABNORMAL LOW (ref 39.0–52.0)
Hemoglobin: 8.4 g/dL — ABNORMAL LOW (ref 13.0–17.0)
MCH: 29.8 pg (ref 26.0–34.0)
MCHC: 32.6 g/dL (ref 30.0–36.0)
MCV: 91.5 fL (ref 78.0–100.0)
PLATELETS: 206 10*3/uL (ref 150–400)
RBC: 2.82 MIL/uL — AB (ref 4.22–5.81)
RDW: 16.3 % — ABNORMAL HIGH (ref 11.5–15.5)
WBC: 10.1 10*3/uL (ref 4.0–10.5)

## 2016-10-30 LAB — PROTIME-INR
INR: 1.71
PROTHROMBIN TIME: 20.3 s — AB (ref 11.4–15.2)

## 2016-10-30 MED ORDER — WARFARIN SODIUM 7.5 MG PO TABS: 7.5000 mg | ORAL_TABLET | Freq: Once | ORAL | Status: DC

## 2016-10-30 MED ORDER — ENOXAPARIN SODIUM 120 MG/0.8ML ~~LOC~~ SOLN: 120.0000 mg | SUBCUTANEOUS | 0 refills | Status: DC

## 2016-10-30 MED ORDER — HYDROCODONE-ACETAMINOPHEN 5-325 MG PO TABS: 1.0000 | ORAL_TABLET | Freq: Four times a day (QID) | ORAL | 0 refills | Status: DC | PRN

## 2016-10-30 MED ORDER — ENOXAPARIN SODIUM 120 MG/0.8ML ~~LOC~~ SOLN
120.0000 mg | SUBCUTANEOUS | Status: DC
  Filled 2016-10-30: qty 0.8

## 2016-10-30 MED ORDER — POLYETHYLENE GLYCOL 3350 17 G PO PACK: 17.0000 g | PACK | Freq: Two times a day (BID) | ORAL | 0 refills | Status: DC

## 2016-10-30 NOTE — Progress Notes (Signed)
Per [employer] on Lovenox S/W ROBERT @ [facility] # [phone]   1. LOVENOX 120 MG TOTAL OF 3 SYRINGES  COVER- NONE FORMULARY  PRIOR APPROVAL - YES # [id number]   2. ENOXAPARIN 120 MG  COVER- YES  CO-PAY- $ 45.00 Q/L  TIER- 4 DRUG  PRIOR APPROVAL- NO   [facility] : [facility]

## 2016-10-30 NOTE — Progress Notes (Signed)
qPhysical Therapy Treatment Patient Details Name: Shaun Mclaughlin MRN: [id number] DOB: 03-22-1935 Today's Date: 10/30/2016    History of Present Illness Pt is an 82 y/o male s/p R THA following a fall which resulted in a R femoral neck fx. PMH includes CHF, Arthritis, DM, HTN, PVD, and afib.     PT Comments    Pt performed increased mobility and reviewed HEPs.  Pt performed increased activity and PTA will f/u for tx in pm.  PTA issued and reviewed HEP.  Plan this pm for stair training.     Follow Up Recommendations  Home health PT;Supervision/Assistance - 24 hour     Equipment Recommendations  Rolling walker with 5" wheels    Recommendations for Other Services       Precautions / Restrictions Precautions Precautions: None Precaution Comments: Progressed supine HEP this session Restrictions Weight Bearing Restrictions: Yes RLE Weight Bearing: Weight bearing as tolerated    Mobility  Bed Mobility Overal bed mobility: Needs Assistance Bed Mobility: Supine to Sit     Supine to sit: Supervision;HOB elevated     General bed mobility comments: Pt OOB upon arrival  Transfers Overall transfer level: Needs assistance Equipment used: Rolling walker (2 wheeled) Transfers: Sit to/from Stand   Stand pivot transfers: Min guard       General transfer comment: Cues for hand placement to and from seated surface.    Ambulation/Gait Ambulation/Gait assistance: Min assist;Min guard Ambulation Distance (Feet): 120 Feet Assistive device: Rolling walker (2 wheeled) Gait Pattern/deviations: Step-through pattern;Antalgic;Shuffle Gait velocity: Decreased Gait velocity interpretation: Below normal speed for age/gender General Gait Details: Cues for R heel strike, R knee extension in stance phase.  Pt tolerated tx well.      Stairs            Wheelchair Mobility    Modified Rankin (Stroke Patients Only)       Balance Overall balance assessment: Needs  assistance Sitting-balance support: No upper extremity supported;Feet supported Sitting balance-Leahy Scale: Good     Standing balance support: Bilateral upper extremity supported;During functional activity Standing balance-Leahy Scale: Poor Standing balance comment: reliant on RW for stability                             Cognition Arousal/Alertness: Awake/alert Behavior During Therapy: WFL for tasks assessed/performed Overall Cognitive Status: Within Functional Limits for tasks assessed                                        Exercises Total Joint Exercises Ankle Circles/Pumps: AROM;Both;15 reps;Supine Quad Sets: AROM;Right;10 reps;Supine Short Arc Quad: AROM;Right;10 reps;Supine Heel Slides: AROM;Right;10 reps;Supine Hip ABduction/ADduction: AROM;Right;20 reps;Standing;Supine Knee Flexion: AROM;Right;10 reps;Standing Marching in Standing: AROM;Right;10 reps;Standing Standing Hip Extension: AROM;Right;10 reps;Standing    General Comments        Pertinent Vitals/Pain Pain Assessment: Faces Pain Score: 3  Faces Pain Scale: Hurts a little bit Pain Location: R hip and knee Pain Descriptors / Indicators: Sore Pain Intervention(s): Limited activity within patient's tolerance;Monitored during session;Repositioned (refused CP.  )    Home Living                      Prior Function            PT Goals (current goals can now be found in the care plan section) Acute Rehab PT  Goals Patient Stated Goal: to return home  Potential to Achieve Goals: Good Progress towards PT goals: Progressing toward goals    Frequency    7X/week      PT Plan Current plan remains appropriate    Co-evaluation             End of Session Equipment Utilized During Treatment: Gait belt Activity Tolerance: Patient limited by fatigue Patient left: in chair;with call bell/phone within reach Nurse Communication: Mobility status PT Visit Diagnosis:  Other abnormalities of gait and mobility (R26.89);Pain Pain - Right/Left: Right Pain - part of body: Hip     Time: 7412-8786 PT Time Calculation (min) (ACUTE ONLY): 27 min  Charges:  $Gait Training: 8-22 mins $Therapeutic Exercise: 8-22 mins                    G Codes:       Governor Rooks, PTA pager [phone]    Cristela Blue 10/30/2016, 11:52 AM

## 2016-10-30 NOTE — Care Management Note (Signed)
Case Management Note Marvetta Gibbons RN, BSN Unit 2W-Case Manager [phone]  Patient Details  Name: Shaun Mclaughlin MRN: [id number] Date of Birth: 09-28-1934  Subjective/Objective:   Pt admitted s/p fall with hip fx-                 Action/Plan: PTA pt lived at home with wife- PT eval with recommendations for [facility]- pt will go home on Lovenox- per insurance check - copay will be $45 for generic- brand is not on formulary. Call made to [facility] - they do have syringes in stock to fill today. Orders for RN/PT/OT placed- spoke with pt at bedside who is agreeable- states he already has walker at home- choice offered for [facility] agency- per pt he does not have a preference as long as insurance is in network- checked with [facility] however they are not servicing pt's address at this time- calls made to several other [place] agencies to check availability- with no one being able to take referral- call then made to [facility] with Alvis Lemmings to see if they could take referral-  Per Meredeth Ide will accept referral for [facility] services, pt will also need a RW per PT as his walker is a standard one- order placed per MD and notified Brad with [facility] for DME need- RW will be delivered to room prior to discharge  Expected Discharge Date:  10/30/16               Expected Discharge Plan:  [place]  In-House Referral:     Discharge planning Services  CM Consult  Post Acute Care Choice:  Home Health Choice offered to:  Patient  DME Arranged:    DME Agency:     HH Arranged:  RN, PT, OT [facility] Agency:     Status of Service:  Completed, signed off  If discussed at [place] of Stay Meetings, dates discussed:  Discharge Disposition: home with home health     Additional Comments:  Dawayne Patricia, RN 10/30/2016, 1:50 PM

## 2016-10-30 NOTE — Progress Notes (Signed)
qPhysical Therapy Treatment Patient Details Name: Shaun Mclaughlin MRN: [id number] DOB: 04-07-1935 Today's Date: 10/30/2016    History of Present Illness Pt is an 82 y/o male s/p R THA following a fall which resulted in a R femoral neck fx. PMH includes CHF, Arthritis, DM, HTN, PVD, and afib.     PT Comments    Pt performed increased mobility, reviwed HEP and reviewed exercises.  Wife present and informed therapist that patient does not have the appropriate RW for d/c home.  Informed RN who informed case management.  Pt with plans to d/c home after arrival of RW.      Follow Up Recommendations  Home health PT;Supervision/Assistance - 24 hour     Equipment Recommendations  Rolling walker with 5" wheels    Recommendations for Other Services       Precautions / Restrictions Precautions Precautions: None Precaution Comments: Progressed supine HEP this session Restrictions Weight Bearing Restrictions: Yes RLE Weight Bearing: Weight bearing as tolerated    Mobility  Bed Mobility Overal bed mobility: Needs Assistance Bed Mobility: Supine to Sit     Supine to sit: Supervision;HOB elevated     General bed mobility comments: Pt in recliner on arrival.    Transfers Overall transfer level: Needs assistance Equipment used: Rolling walker (2 wheeled) Transfers: Sit to/from Stand Sit to Stand: Supervision Stand pivot transfers: Supervision       General transfer comment: Cues for hand placement to and from seated surface.    Ambulation/Gait Ambulation/Gait assistance: Min guard Ambulation Distance (Feet): 100 Feet (+ 20 ft) Assistive device: Rolling walker (2 wheeled) Gait Pattern/deviations: Step-through pattern;Antalgic;Shuffle Gait velocity: Decreased Gait velocity interpretation: Below normal speed for age/gender General Gait Details: Cues for R heel strike, R knee extension in stance phase.  Pt tolerated tx well.   Forgets at times and requires more cueing for quad  control.     Stairs     Stair Management: No rails;Backwards;Step to pattern;With walker Number of Stairs: 4 General stair comments: Cues for use of RW to negotiate x 2 stairs (x2 trials) to ensure safe entry into home.  Pt performed repeated trials to reteach method to therapist for accuracy.  NO LOB noted cues for negotiation and RW placement.  Backwards to ascend and forwards to descend.    Wheelchair Mobility    Modified Rankin (Stroke Patients Only)       Balance Overall balance assessment: Needs assistance Sitting-balance support: No upper extremity supported;Feet supported Sitting balance-Leahy Scale: Good     Standing balance support: Bilateral upper extremity supported;During functional activity Standing balance-Leahy Scale: Poor Standing balance comment: reliant on RW for stability                             Cognition Arousal/Alertness: Awake/alert Behavior During Therapy: WFL for tasks assessed/performed Overall Cognitive Status: Within Functional Limits for tasks assessed                                        Exercises Total Joint Exercises Ankle Circles/Pumps: AROM;Both;15 reps;Supine Quad Sets: AROM;Right;10 reps;Supine Short Arc Quad: AROM;Right;10 reps;Supine Heel Slides: AROM;Right;10 reps;Supine Hip ABduction/ADduction: AROM;Right;20 reps;Standing;Supine (1x10 in supine, 1x10 in standing.  ) Long Arc Quad: AROM;Right;10 reps;Seated Knee Flexion: AROM;Right;10 reps;Standing Marching in Standing: AROM;Right;10 reps;Standing Standing Hip Extension: AROM;Right;10 reps;Standing    General Comments  Pertinent Vitals/Pain Pain Assessment: Faces Pain Score: 3  Faces Pain Scale: Hurts a little bit Pain Location: R hip and knee Pain Descriptors / Indicators: Sore Pain Intervention(s): Monitored during session;Repositioned    Home Living                      Prior Function            PT Goals (current  goals can now be found in the care plan section) Acute Rehab PT Goals Patient Stated Goal: to return home  Potential to Achieve Goals: Good Progress towards PT goals: Progressing toward goals    Frequency    7X/week      PT Plan Current plan remains appropriate    Co-evaluation             End of Session Equipment Utilized During Treatment: Gait belt Activity Tolerance: Patient limited by fatigue Patient left: in chair;with call bell/phone within reach Nurse Communication: Mobility status PT Visit Diagnosis: Other abnormalities of gait and mobility (R26.89);Pain Pain - Right/Left: Right Pain - part of body: Hip     Time: 0092-3300 PT Time Calculation (min) (ACUTE ONLY): 32 min  Charges:  $Gait Training: 8-22 mins $Therapeutic Exercise: 8-22 mins                    G Codes:       Governor Rooks, PTA pager [phone]    Cristela Blue 10/30/2016, 3:32 PM

## 2016-10-30 NOTE — Progress Notes (Signed)
ANTICOAGULATION CONSULT NOTE - Follow-up Consult  Pharmacy Consult:  Coumadin Indication: atrial fibrillation  No Known Allergies  Patient Measurements: Height: 5\' 6"  (167.6 cm) Weight: 173 lb 4.5 oz (78.6 kg) IBW/kg (Calculated) : 63.8  Vital Signs: Temp: 97.2 F (36.2 C) (04/06 0526) BP: 130/65 (04/06 0526) Pulse Rate: 87 (04/06 0526)  Labs:  Recent Labs  10/28/16 1222 10/29/16 0511 10/29/16 1721 10/30/16 0553  HGB 9.4* 8.5*  --  8.4*  HCT 28.8* 25.7*  --  25.8*  PLT 228 202  --  206  LABPROT 19.0* 19.4*  --  20.3*  INR 1.58 1.62  --  1.71  CREATININE 1.41* 1.54* 1.65* 1.31*    Estimated Creatinine Clearance: 44.3 mL/min (A) (by C-G formula based on SCr of 1.31 mg/dL (H)).  Assessment: 82 yo M presented with right hip pain, now s/p right THA on 4/3.   Pharmacy consulted to resume Coumadin from PTA for history of AFib.  Noted patient received Vitamin K on 10/25/16.  Patient was on Lovenox 30mg  subQ q24h which is prophylactic dose for CrCL <24mL/min. Discussed with Dr. Lonny Prude this morning- due to high risk, to change to therapeutic full dose for discharge. Patient has used Lovenox before and will have home health RN.  INR today 1.71. Hgb and plts dropped post-op, but have been stable since initial drop. No overt bleeding noted.  Home Coumadin dose:  5mg  daily, last dose 10/24/16  Goal of Therapy:  INR 2-3   Plan:  - Repeat Coumadin 7.5mg  PO today, then resume Coumadin 5mg  daily starting 4/7. - Lovenox 120mg  subQ q24h starting 4/6 at 180 (plan for patient discharge today, so will likely be at home) - Recommend INR check on Sunday 4/8 or Monday 4/9 by Home Health- will likely be able to discontinue Lovenox at that point  [place] D. Mariselda Badalamenti, PharmD, BCPS Clinical Pharmacist Pager: [phone] 10/30/2016 10:22 AM

## 2016-10-30 NOTE — Progress Notes (Signed)
Pt received walker for home use.  Reviewed discharge instructions and medications with patient and family. Pt verbalized an understanding with no questions or concerns. Pt left unit with family and all belongings.

## 2016-10-30 NOTE — Discharge Summary (Signed)
Physician Discharge Summary  Shaun Mclaughlin:[id number] DOB: 06/05/1935 DOA: 10/25/2016  PCP: Gara Kroner, MD  Admit date: 10/25/2016 Discharge date: 10/30/2016  Admitted From: Home Disposition: Home  Recommendations for Outpatient Follow-up:  1. Follow up with PCP in 1 week 2. Follow up with orthopedic surgery 3. Follow up BMP to recheck creatinine 4. Follow up CBC to recheck hemoglobin 5. Bridging Coumadin with Lovenox until INR is therapeutic 6. Consider restarting lisinopril once AKI fully resolved  Home Health: Physical therapy, nursing Equipment/Devices: Rolling walker  Discharge Condition: Stable CODE STATUS: Full code Diet recommendation: Heart healthy   Brief/Interim Summary:  Admission HPI written by Norval Morton, MD   Chief Complaint: Right hip pain  HPI: Shaun Mclaughlin is a 82 y.o. male with medical history significant of PAF on Coumadin , CAD, DM type II, PVD, combined systolic and diastolic CHF; who presents with right hip pain after fall. Reports falling 3 days ago while walking to bed. At that time he had gone into a coughing spell,  lost his balance, and fell landing on his right hip and knee. Thereafter his wife had to help him get up. He has had trouble putting weight on the right leg since that time. Initially tried using a cane and then walker, but still not able to bear much weight on the right hip. His wife noted that he needed assistance to get up or ambulate. He had been using over-the-counter creams and  medicines to treat pain symptoms. Associated symptoms included insomnia 2/2 pain, cough, lower extremity swelling, and decreased feeling/numbness and the right leg.   ED Course:  Admission into the emergency department patient was evaluated and seen to have a right femoral neck fracture. Patient's initial lab work revealed WBC 11.2, BUN 29, creatinine 1.28 INR 3.04 Orthopedics was consulted and recommended lowering INR. Patient was given 10 mg of  vitamin K while in the ED.   Review of Systems: As per HPI otherwise 10 point review of systems negative.    Hospital course:  Right femoral neck fracture Patient was seen by orthopedic surgery and underwent right total hip arthroplasty on 10/27/16. Physical therapy evaluated him and recommended home health physical therapy  Paroxysmal atrial fibrillation CHA2DS2-VASc Scoreis 5. INR of 1.62. High risk. Coumadin held for procedure. Restarted Coumadin and bridging with Lovenox. Continue bridge at discharge.  Acute kidney injury Baseline creatinine of 0.86. Worsened to a nadir of 1.65. Lasix given with improvement. Continue at discharge.  Essential hypertension Continued metoprolol and held lisinopril secondary to acute kidney injury. Held lisinopril at discharge.  Diabetes mellitus, type 2 Sliding scale insulin while inpatient. Resume metformin at discharge.  Hyperthyroidism Continued PTU.  Hyperlipidemia Continued atorvastatin.  Anemia Post-op. Hemoglobin dropped to 8.5 and remained stable at 8.4.   Chronic systolic and diastolic CHF Last EF of 67-61% on 07/13/2014. Stable.  Discharge Diagnoses:  Principal Problem:   Displaced fracture of right femoral neck (HCC) Active Problems:   Persistent atrial fibrillation (HCC)   Chronic anticoagulation, coumadin for PAF   DM (diabetes mellitus) (HCC)   HTN (hypertension)   Acute on chronic combined systolic and diastolic congestive heart failure, NYHA class 2 (HCC)   Chronic combined systolic and diastolic heart failure, NYHA class 2 ([place])   Hip fracture (HCC)   S/P right hip fracture   Hyperthyroidism    Discharge Instructions  Discharge Instructions    Call MD for:  persistant dizziness or light-headedness    Complete by:  As  directed    Call MD for:  persistant nausea and vomiting    Complete by:  As directed    Call MD for:  severe uncontrolled pain    Complete by:  As directed    Call MD for:   temperature >100.4    Complete by:  As directed    Increase activity slowly    Complete by:  As directed      Allergies as of 10/30/2016   No Known Allergies     Medication List    STOP taking these medications   ibuprofen 200 MG tablet Commonly known as:  ADVIL,MOTRIN   lisinopril 40 MG tablet Commonly known as:  PRINIVIL,ZESTRIL   metoprolol tartrate 25 MG tablet Commonly known as:  LOPRESSOR   RA COLD & ALLERGY 1-2.5 MG/5ML syrup Generic drug:  Brompheniramine-Phenylephrine     TAKE these medications   albuterol 108 (90 Base) MCG/ACT inhaler Commonly known as:  PROVENTIL HFA;VENTOLIN HFA Inhale 2 puffs into the lungs every 6 (six) hours as needed for wheezing or shortness of breath.   atorvastatin 40 MG tablet Commonly known as:  LIPITOR Take 1 tablet (40 mg total) by mouth daily.   enoxaparin 120 MG/0.8ML injection Commonly known as:  LOVENOX Inject 0.8 mLs (120 mg total) into the skin daily.   furosemide 40 MG tablet Commonly known as:  LASIX Take 1 tablet (40 mg total) by mouth daily. What changed:  when to take this  reasons to take this   GENTEAL MILD 0.2 % Soln Generic drug:  Hypromellose Apply 2 drops to eye every morning.   HYDROcodone-acetaminophen 5-325 MG tablet Commonly known as:  NORCO/VICODIN Take 1-2 tablets by mouth every 6 (six) hours as needed for moderate pain.   KRILL OIL PO Take 1 capsule by mouth daily.   latanoprost 0.005 % ophthalmic solution Commonly known as:  XALATAN Place 1 drop into both eyes at bedtime.   levocetirizine 5 MG tablet Commonly known as:  XYZAL Take 5 mg by mouth daily as needed for allergies.   metFORMIN 500 MG 24 hr tablet Commonly known as:  GLUCOPHAGE-XR Take 500 mg by mouth daily with breakfast.   multivitamin with minerals Tabs tablet Take 1 tablet by mouth daily.   naproxen sodium 220 MG tablet Commonly known as:  ANAPROX Take 220 mg by mouth 2 (two) times daily as needed (PAIN).    NASACORT ALLERGY 24HR 55 MCG/ACT Aero nasal inhaler Generic drug:  triamcinolone Place 2 sprays into the nose daily as needed (ALLERGIES).   omeprazole 20 MG capsule Commonly known as:  PRILOSEC Take 20 mg by mouth daily as needed (heartburn).   OVER THE COUNTER MEDICATION Apply 1 application topically daily as needed (SCARRING CREME).   polyethylene glycol packet Commonly known as:  MIRALAX / GLYCOLAX Take 17 g by mouth 2 (two) times daily.   potassium chloride SA 20 MEQ tablet Commonly known as:  K-DUR,KLOR-CON Take 1 tablet (20 mEq total) by mouth daily as needed (take with lasix).   propylthiouracil 50 MG tablet Commonly known as:  PTU Take 50 mg by mouth daily.   SALONPAS GEL EX Apply 1 application topically daily as needed (pain).   trolamine salicylate 10 % cream Commonly known as:  ASPERCREME Apply 1 application topically as needed for muscle pain.   vitamin C with rose hips 1000 MG tablet Take 1,000 mg by mouth daily.   warfarin 5 MG tablet Commonly known as:  COUMADIN take 1 to 1  and 1/2 tablets by mouth as directed BY COUMADIN CLINIC What changed:  how much to take  how to take this  when to take this  additional instructions      Follow-up Information    Swinteck, Horald Pollen, MD. Schedule an appointment as soon as possible for a visit in 2 week(s).   Specialty:  Orthopedic Surgery Why:  For wound re-check Contact information: [address] [place] [address] [phone]        Gara Kroner, MD. Schedule an appointment as soon as possible for a visit in 1 week(s).   Specialty:  Family Medicine Contact information: [place] [address] [phone]          No Known Allergies  Consultations:  Orthopedic surgery   Procedures/Studies: Dg Chest 1 View  Result Date: 10/25/2016 CLINICAL DATA:  Right hip pain status post fall. EXAM: CHEST 1 VIEW COMPARISON:  05/17/2015 FINDINGS:  The cardiac silhouette remains enlarged. Aortic atherosclerosis is noted. There is a persistent small right pleural effusion with patchy right basilar parenchymal airspace opacity, similar to the prior study. The left lung is clear. No pneumothorax is seen. A left subclavian region vascular stent is noted. There is mild thoracic spondylosis. IMPRESSION: 1. Small right pleural effusion with right basilar atelectasis versus pneumonia, similar to the prior study. 2. Aortic atherosclerosis. Electronically Signed   By: Logan Bores M.D.   On: 10/25/2016 20:13   Dg Knee 2 Views Right  Result Date: 10/25/2016 CLINICAL DATA:  Right knee pain.  History recent right hip fracture. EXAM: RIGHT KNEE - 1-2 VIEW COMPARISON:  None. FINDINGS: Trace joint effusion. No fracture or malalignment of the right knee. Spurring off the upper pole of the patella. Popliteal arteriosclerosis is noted. Mild osteopenia of the visualized fibula. IMPRESSION: Trace joint effusion. No acute osseous abnormality of the right knee. Electronically Signed   By: Ashley Royalty M.D.   On: 10/25/2016 22:59   Pelvis Portable  Result Date: 10/27/2016 CLINICAL DATA:  RIGHT total hip arthroplasty. EXAM: OPERATIVE RIGHT HIP (WITH PELVIS IF PERFORMED) AP VIEW PELVIS TECHNIQUE: Fluoroscopic spot image(s) were submitted for interpretation post-operatively. Single portable AP radiograph of the pelvis. FLUOROSCOPY TIME:  17 seconds COMPARISON:  RIGHT hip radiograph October 25, 2016 FINDINGS: Two fluoroscopic spot views of the RIGHT hip. Status post RIGHT hip total arthroplasty with intact well-seated non cemented hardware. No acute fracture deformity. No dislocation. No destructive bony lesions. RIGHT hip soft tissue swelling and subcutaneous gas consistent with recent surgery. Phleboliths project in the pelvis. IMPRESSION: Status post RIGHT hip total arthroplasty with expected postoperative changes. Electronically Signed   By: Elon Alas M.D.   On: 10/27/2016  21:31   Dg C-arm 61-120 Min  Result Date: 10/27/2016 CLINICAL DATA:  RIGHT total hip arthroplasty. EXAM: OPERATIVE RIGHT HIP (WITH PELVIS IF PERFORMED) AP VIEW PELVIS TECHNIQUE: Fluoroscopic spot image(s) were submitted for interpretation post-operatively. Single portable AP radiograph of the pelvis. FLUOROSCOPY TIME:  17 seconds COMPARISON:  RIGHT hip radiograph October 25, 2016 FINDINGS: Two fluoroscopic spot views of the RIGHT hip. Status post RIGHT hip total arthroplasty with intact well-seated non cemented hardware. No acute fracture deformity. No dislocation. No destructive bony lesions. RIGHT hip soft tissue swelling and subcutaneous gas consistent with recent surgery. Phleboliths project in the pelvis. IMPRESSION: Status post RIGHT hip total arthroplasty with expected postoperative changes. Electronically Signed   By: Elon Alas M.D.   On: 10/27/2016 21:31  Dg Hip Operative Unilat W Or W/o Pelvis Right  Result Date: 10/27/2016 CLINICAL DATA:  RIGHT total hip arthroplasty. EXAM: OPERATIVE RIGHT HIP (WITH PELVIS IF PERFORMED) AP VIEW PELVIS TECHNIQUE: Fluoroscopic spot image(s) were submitted for interpretation post-operatively. Single portable AP radiograph of the pelvis. FLUOROSCOPY TIME:  17 seconds COMPARISON:  RIGHT hip radiograph October 25, 2016 FINDINGS: Two fluoroscopic spot views of the RIGHT hip. Status post RIGHT hip total arthroplasty with intact well-seated non cemented hardware. No acute fracture deformity. No dislocation. No destructive bony lesions. RIGHT hip soft tissue swelling and subcutaneous gas consistent with recent surgery. Phleboliths project in the pelvis. IMPRESSION: Status post RIGHT hip total arthroplasty with expected postoperative changes. Electronically Signed   By: Elon Alas M.D.   On: 10/27/2016 21:31   Dg Hip Unilat  With Pelvis 2-3 Views Right  Result Date: 10/25/2016 CLINICAL DATA:  Right hip pain after fall.  Initial encounter. EXAM: DG HIP (WITH OR  WITHOUT PELVIS) 2-3V RIGHT COMPARISON:  None. FINDINGS: There is a comminuted subcapital femoral neck fracture on the right which also appears to involve a portion of the inferolateral head. There is mild displacement. There is rotation of the more distal femur. The femoral head remains seated in the acetabulum. Limited evaluation of the left hip is without evidence of acute osseous abnormality. IMPRESSION: Right femoral neck fracture. Electronically Signed   By: Logan Bores M.D.   On: 10/25/2016 20:07       Subjective: No hip pain. Has been up and walking well.  Discharge Exam: Vitals:   10/29/16 2048 10/30/16 0526  BP: (!) 103/52 130/65  Pulse: 96 87  Resp: 18 18  Temp: 99.2 F (37.3 C) 97.2 F (36.2 C)   Vitals:   10/29/16 1610 10/29/16 1900 10/29/16 2048 10/30/16 0526  BP: (!) 86/44 (!) 123/47 (!) 103/52 130/65  Pulse:   96 87  Resp:   18 18  Temp:   99.2 F (37.3 C) 97.2 F (36.2 C)  TempSrc:   Oral   SpO2:   96% 97%  Weight:      Height:        General exam: Appears calm and comfortable Respiratory system: Clear to auscultation. Respiratory effort normal. Cardiovascular system: S1 & S2 heard, RRR. No murmurs Gastrointestinal system: Abdomen is nondistended, soft and nontender.  Normal bowel sounds heard. Central nervous system: Alert and oriented. No focal neurological deficits. Extremities: No edema. No calf tenderness Skin: No cyanosis. No rashes Psychiatry: Judgement and insight appear normal. Mood & affect appropriate.   The results of significant diagnostics from this hospitalization (including imaging, microbiology, ancillary and laboratory) are listed below for reference.     Microbiology: Recent Results (from the past 240 hour(s))  Surgical PCR screen     Status: Abnormal   Collection Time: 10/27/16  1:19 PM  Result Value Ref Range Status   MRSA, PCR NEGATIVE NEGATIVE Final   Staphylococcus aureus POSITIVE (A) NEGATIVE Final    Comment:        The  Xpert SA Assay (FDA approved for NASAL specimens in patients over 62 years of age), is one component of a comprehensive surveillance program.  Test performance has been validated by [facility] for patients greater than or equal to 64 year old. It is not intended to diagnose infection nor to guide or monitor treatment.      Labs: BNP (last 3 results)  Recent Labs  10/26/16 0030  BNP 008.6*   Basic Metabolic Panel:  Recent Labs Lab 10/27/16 0533 10/28/16 1222 10/29/16 0511 10/29/16 1721 10/30/16 0553  NA 134* 134* 132* 129* 133*  K 4.7 4.6 5.0 5.0 4.2  CL 95* 96* 95* 92* 95*  CO2 27 28 28 26 28   GLUCOSE 132* 187* 141* 172* 135*  BUN 27* 36* 46* 50* 46*  CREATININE 1.08 1.41* 1.54* 1.65* 1.31*  CALCIUM 8.5* 8.2* 8.1* 8.0* 8.2*   Liver Function Tests: No results for input(s): AST, ALT, ALKPHOS, BILITOT, PROT, ALBUMIN in the last 168 hours. No results for input(s): LIPASE, AMYLASE in the last 168 hours. No results for input(s): AMMONIA in the last 168 hours. CBC:  Recent Labs Lab 10/25/16 1858 10/26/16 0438 10/27/16 0533 10/28/16 1222 10/29/16 0511 10/30/16 0553  WBC 11.2* 7.5 6.5 13.7* 10.2 10.1  NEUTROABS 9.7*  --   --   --   --   --   HGB 13.1 12.0* 12.6* 9.4* 8.5* 8.4*  HCT 38.9* 36.6* 37.3* 28.8* 25.7* 25.8*  MCV 90.9 90.8 91.6 91.4 92.4 91.5  PLT 229 203 185 228 202 206   Cardiac Enzymes: No results for input(s): CKTOTAL, CKMB, CKMBINDEX, TROPONINI in the last 168 hours. BNP: Invalid input(s): POCBNP CBG:  Recent Labs Lab 10/29/16 0820 10/29/16 1232 10/29/16 1739 10/29/16 2046 10/30/16 0734  GLUCAP 124* 138* 164* 156* 128*   D-Dimer No results for input(s): DDIMER in the last 72 hours. Hgb A1c No results for input(s): HGBA1C in the last 72 hours. Lipid Profile No results for input(s): CHOL, HDL, LDLCALC, TRIG, CHOLHDL, LDLDIRECT in the last 72 hours. Thyroid function studies No results for input(s): TSH, T4TOTAL, T3FREE, THYROIDAB  in the last 72 hours.  Invalid input(s): FREET3 Anemia work up No results for input(s): VITAMINB12, FOLATE, FERRITIN, TIBC, IRON, RETICCTPCT in the last 72 hours. Urinalysis No results found for: COLORURINE, APPEARANCEUR, [place], [place], [place], [place], [place], [place], PROTEINUR, UROBILINOGEN, NITRITE, LEUKOCYTESUR Sepsis Labs Invalid input(s): PROCALCITONIN,  WBC,  LACTICIDVEN Microbiology Recent Results (from the past 240 hour(s))  Surgical PCR screen     Status: Abnormal   Collection Time: 10/27/16  1:19 PM  Result Value Ref Range Status   MRSA, PCR NEGATIVE NEGATIVE Final   Staphylococcus aureus POSITIVE (A) NEGATIVE Final    Comment:        The Xpert SA Assay (FDA approved for NASAL specimens in patients over 64 years of age), is one component of a comprehensive surveillance program.  Test performance has been validated by [facility] for patients greater than or equal to 74 year old. It is not intended to diagnose infection nor to guide or monitor treatment.      Time coordinating discharge: Over 30 minutes  SIGNED:   Cordelia Poche, MD [facility] 10/30/2016, 12:06 PM Pager [phone]  If 7PM-7AM, please contact night-coverage [email or web address] Password [email or web address]

## 2016-11-02 ENCOUNTER — Ambulatory Visit (INDEPENDENT_AMBULATORY_CARE_PROVIDER_SITE_OTHER): Payer: Medicare Other | Admitting: Pharmacist

## 2016-11-02 DIAGNOSIS — Z7901 Long term (current) use of anticoagulants: Secondary | ICD-10-CM

## 2016-11-02 DIAGNOSIS — I4819 Other persistent atrial fibrillation: Secondary | ICD-10-CM

## 2016-11-02 DIAGNOSIS — I481 Persistent atrial fibrillation: Secondary | ICD-10-CM

## 2016-11-02 LAB — PROTIME-INR: INR: 2.8 — AB (ref <=1.1)

## 2016-11-02 NOTE — Telephone Encounter (Signed)
Routed to [facility] clinic for f/u

## 2016-11-02 NOTE — Telephone Encounter (Signed)
New Message     Nurse with Shaun Mclaughlin called to report that she checked patient's INR today and it was 2.8. Per nurse pt was unable to make coumadin appt Friday.

## 2016-11-02 NOTE — Telephone Encounter (Signed)
Done - see anticoagulation note

## 2016-11-03 ENCOUNTER — Other Ambulatory Visit: Payer: Self-pay | Admitting: Cardiovascular Disease

## 2016-11-03 NOTE — Telephone Encounter (Signed)
REFILL 

## 2016-11-06 ENCOUNTER — Ambulatory Visit (INDEPENDENT_AMBULATORY_CARE_PROVIDER_SITE_OTHER): Payer: Medicare Other | Admitting: Pharmacist Clinician (PhC)/ Clinical Pharmacy Specialist

## 2016-11-06 DIAGNOSIS — I4819 Other persistent atrial fibrillation: Secondary | ICD-10-CM

## 2016-11-06 DIAGNOSIS — I481 Persistent atrial fibrillation: Secondary | ICD-10-CM

## 2016-11-06 DIAGNOSIS — Z7901 Long term (current) use of anticoagulants: Secondary | ICD-10-CM

## 2016-11-06 LAB — POCT INR: INR: 3.7

## 2016-11-06 MED ORDER — METOPROLOL TARTRATE 25 MG PO TABS: 25.0000 mg | ORAL_TABLET | Freq: Two times a day (BID) | ORAL | 3 refills | Status: DC

## 2016-11-06 MED ORDER — LISINOPRIL 40 MG PO TABS: 40.0000 mg | ORAL_TABLET | Freq: Every day | ORAL | 3 refills | Status: DC

## 2016-11-13 ENCOUNTER — Ambulatory Visit (INDEPENDENT_AMBULATORY_CARE_PROVIDER_SITE_OTHER): Payer: Medicare Other | Admitting: Pharmacist Clinician (PhC)/ Clinical Pharmacy Specialist

## 2016-11-13 DIAGNOSIS — I4819 Other persistent atrial fibrillation: Secondary | ICD-10-CM

## 2016-11-13 DIAGNOSIS — I481 Persistent atrial fibrillation: Secondary | ICD-10-CM

## 2016-11-13 DIAGNOSIS — Z7901 Long term (current) use of anticoagulants: Secondary | ICD-10-CM

## 2016-11-13 LAB — POCT INR: INR: 2.8

## 2016-11-20 ENCOUNTER — Other Ambulatory Visit: Payer: Self-pay | Admitting: Cardiovascular Disease

## 2016-11-25 ENCOUNTER — Ambulatory Visit (INDEPENDENT_AMBULATORY_CARE_PROVIDER_SITE_OTHER): Payer: Medicare Other | Admitting: Pharmacist Clinician (PhC)/ Clinical Pharmacy Specialist

## 2016-11-25 DIAGNOSIS — I481 Persistent atrial fibrillation: Secondary | ICD-10-CM

## 2016-11-25 DIAGNOSIS — I4819 Other persistent atrial fibrillation: Secondary | ICD-10-CM

## 2016-11-25 DIAGNOSIS — Z7901 Long term (current) use of anticoagulants: Secondary | ICD-10-CM

## 2016-11-25 LAB — POCT INR: INR: 2.8

## 2016-12-18 ENCOUNTER — Ambulatory Visit (INDEPENDENT_AMBULATORY_CARE_PROVIDER_SITE_OTHER): Payer: Medicare Other | Admitting: Pharmacist Clinician (PhC)/ Clinical Pharmacy Specialist

## 2016-12-18 DIAGNOSIS — I4819 Other persistent atrial fibrillation: Secondary | ICD-10-CM

## 2016-12-18 DIAGNOSIS — I480 Paroxysmal atrial fibrillation: Secondary | ICD-10-CM | POA: Diagnosis not present

## 2016-12-18 DIAGNOSIS — Z7901 Long term (current) use of anticoagulants: Secondary | ICD-10-CM

## 2016-12-18 DIAGNOSIS — I481 Persistent atrial fibrillation: Secondary | ICD-10-CM

## 2016-12-18 LAB — POCT INR: INR: 2.4

## 2016-12-23 ENCOUNTER — Other Ambulatory Visit: Payer: Self-pay | Admitting: Cardiovascular Disease

## 2016-12-23 NOTE — Telephone Encounter (Signed)
Rx has been sent to the pharmacy electronically. ° °

## 2016-12-28 NOTE — Addendum Note (Signed)
Addendum  created 12/28/16 1322 by Oleta Mouse, MD   Sign clinical note

## 2017-01-15 ENCOUNTER — Ambulatory Visit (INDEPENDENT_AMBULATORY_CARE_PROVIDER_SITE_OTHER): Payer: Medicare Other | Admitting: Pharmacist Clinician (PhC)/ Clinical Pharmacy Specialist

## 2017-01-15 DIAGNOSIS — Z7901 Long term (current) use of anticoagulants: Secondary | ICD-10-CM | POA: Diagnosis not present

## 2017-01-15 DIAGNOSIS — I481 Persistent atrial fibrillation: Secondary | ICD-10-CM

## 2017-01-15 DIAGNOSIS — I480 Paroxysmal atrial fibrillation: Secondary | ICD-10-CM

## 2017-01-15 DIAGNOSIS — I4819 Other persistent atrial fibrillation: Secondary | ICD-10-CM

## 2017-01-15 LAB — POCT INR: INR: 2.3

## 2017-02-12 ENCOUNTER — Ambulatory Visit (INDEPENDENT_AMBULATORY_CARE_PROVIDER_SITE_OTHER): Payer: Medicare Other | Admitting: Pharmacist

## 2017-02-12 DIAGNOSIS — Z7901 Long term (current) use of anticoagulants: Secondary | ICD-10-CM

## 2017-02-12 DIAGNOSIS — I480 Paroxysmal atrial fibrillation: Secondary | ICD-10-CM

## 2017-02-12 DIAGNOSIS — I481 Persistent atrial fibrillation: Secondary | ICD-10-CM | POA: Diagnosis not present

## 2017-02-12 DIAGNOSIS — I4819 Other persistent atrial fibrillation: Secondary | ICD-10-CM

## 2017-02-12 LAB — POCT INR: INR: 1.9

## 2017-02-19 ENCOUNTER — Other Ambulatory Visit: Payer: Self-pay | Admitting: Cardiovascular Disease

## 2017-02-19 MED ORDER — ALBUTEROL SULFATE HFA 108 (90 BASE) MCG/ACT IN AERS: 2.0000 | INHALATION_SPRAY | Freq: Four times a day (QID) | RESPIRATORY_TRACT | 5 refills | Status: DC | PRN

## 2017-02-19 NOTE — Telephone Encounter (Signed)
°*  STAT* If patient is at the pharmacy, call can be transferred to refill team.   1. Which medications need to be refilled? (please list name of each medication and dose if known)Albuterol-pharmacist said they had sent this in earlier this week 2. Which pharmacy/location (including street and city if local pharmacy) is medication to be sent to?[facility] [phone]  3. Do they need a 30 day or 90 day supply? inhaler

## 2017-02-19 NOTE — Telephone Encounter (Signed)
Rx has been sent to the pharmacy electronically. ° °

## 2017-03-09 ENCOUNTER — Other Ambulatory Visit: Payer: Self-pay | Admitting: Cardiovascular Disease

## 2017-03-12 ENCOUNTER — Ambulatory Visit (INDEPENDENT_AMBULATORY_CARE_PROVIDER_SITE_OTHER): Payer: Medicare Other | Admitting: Pharmacist

## 2017-03-12 DIAGNOSIS — Z7901 Long term (current) use of anticoagulants: Secondary | ICD-10-CM

## 2017-03-12 DIAGNOSIS — I480 Paroxysmal atrial fibrillation: Secondary | ICD-10-CM | POA: Diagnosis not present

## 2017-03-12 DIAGNOSIS — I481 Persistent atrial fibrillation: Secondary | ICD-10-CM

## 2017-03-12 DIAGNOSIS — I4819 Other persistent atrial fibrillation: Secondary | ICD-10-CM

## 2017-03-12 LAB — POCT INR: INR: 2

## 2017-04-09 ENCOUNTER — Ambulatory Visit (INDEPENDENT_AMBULATORY_CARE_PROVIDER_SITE_OTHER): Payer: Medicare Other | Admitting: Pharmacist Clinician (PhC)/ Clinical Pharmacy Specialist

## 2017-04-09 DIAGNOSIS — I481 Persistent atrial fibrillation: Secondary | ICD-10-CM | POA: Diagnosis not present

## 2017-04-09 DIAGNOSIS — I480 Paroxysmal atrial fibrillation: Secondary | ICD-10-CM

## 2017-04-09 DIAGNOSIS — Z5181 Encounter for therapeutic drug level monitoring: Secondary | ICD-10-CM | POA: Diagnosis not present

## 2017-04-09 DIAGNOSIS — I4819 Other persistent atrial fibrillation: Secondary | ICD-10-CM

## 2017-04-09 DIAGNOSIS — Z7901 Long term (current) use of anticoagulants: Secondary | ICD-10-CM

## 2017-04-09 LAB — POCT INR: INR: 2

## 2017-05-01 ENCOUNTER — Other Ambulatory Visit: Payer: Self-pay | Admitting: Cardiovascular Disease

## 2017-05-07 ENCOUNTER — Ambulatory Visit (INDEPENDENT_AMBULATORY_CARE_PROVIDER_SITE_OTHER): Payer: Medicare Other | Admitting: Pharmacist Clinician (PhC)/ Clinical Pharmacy Specialist

## 2017-05-07 ENCOUNTER — Ambulatory Visit (INDEPENDENT_AMBULATORY_CARE_PROVIDER_SITE_OTHER): Payer: Medicare Other | Admitting: Cardiovascular Disease

## 2017-05-07 ENCOUNTER — Encounter: Payer: Self-pay | Admitting: Cardiovascular Disease

## 2017-05-07 VITALS — BP 122/62 | HR 82 | Ht 66.0 in | Wt 167.6 lb

## 2017-05-07 DIAGNOSIS — E1151 Type 2 diabetes mellitus with diabetic peripheral angiopathy without gangrene: Secondary | ICD-10-CM

## 2017-05-07 DIAGNOSIS — Z7901 Long term (current) use of anticoagulants: Secondary | ICD-10-CM

## 2017-05-07 DIAGNOSIS — J439 Emphysema, unspecified: Secondary | ICD-10-CM

## 2017-05-07 DIAGNOSIS — R001 Bradycardia, unspecified: Secondary | ICD-10-CM | POA: Diagnosis not present

## 2017-05-07 DIAGNOSIS — I4819 Other persistent atrial fibrillation: Secondary | ICD-10-CM

## 2017-05-07 DIAGNOSIS — I5042 Chronic combined systolic (congestive) and diastolic (congestive) heart failure: Secondary | ICD-10-CM | POA: Diagnosis not present

## 2017-05-07 DIAGNOSIS — I739 Peripheral vascular disease, unspecified: Secondary | ICD-10-CM | POA: Diagnosis not present

## 2017-05-07 DIAGNOSIS — Z5181 Encounter for therapeutic drug level monitoring: Secondary | ICD-10-CM | POA: Diagnosis not present

## 2017-05-07 DIAGNOSIS — I481 Persistent atrial fibrillation: Secondary | ICD-10-CM | POA: Diagnosis not present

## 2017-05-07 DIAGNOSIS — I480 Paroxysmal atrial fibrillation: Secondary | ICD-10-CM | POA: Diagnosis not present

## 2017-05-07 DIAGNOSIS — I2510 Atherosclerotic heart disease of native coronary artery without angina pectoris: Secondary | ICD-10-CM

## 2017-05-07 DIAGNOSIS — E782 Mixed hyperlipidemia: Secondary | ICD-10-CM | POA: Diagnosis not present

## 2017-05-07 LAB — POCT INR: INR: 1.7

## 2017-05-07 NOTE — Progress Notes (Signed)
Patient ID: Shaun Mclaughlin, male   DOB: [birth year], 82 y.o.   MRN: [id number]    Cardiology Office Note    Date:  05/07/2017   ID:  Shaun Mclaughlin, DOB [birth year], MRN [id number]  PCP:  Antony Contras, MD  Cardiologist:   Sanda Klein, MD   Chief Complaint  Patient presents with  . Follow-up    pt reports no complaints    History of Present Illness:  Shaun Mclaughlin is a 82 y.o. male with a long-standing history of atrial arrhythmia (persistent atrial fibrillation and atrial flutter requiring cardioversion in the past), Now probably permanent atrial fibrillation, peripheral arterial disease, coronary artery disease (last revascularization procedure left subclavian stent 2014), controlled type 2 diabetes mellitus and hypertension.  He had a hip replacement after fracture roughly 6 months ago. It took him longer than anticipated to recover, but he is now "90% back". He is not using a walker or cane. He is back at work, 29 hours a week. He has not required any adjustments in his usual dose of diuretic and denies orthopnea, PND or leg edema. Has not had angina pectoris and is unaware of palpitations. He denies dizziness, syncope or focal neurological events. He has not had any bleeding problems other than usual bruising. His INR is usually therapeutic, slightly subtherapeutic today and the dose was adjusted.  He has not had new falls. He has lost a little bit of weight.  He is compliant with warfarin and PT/INR checks. He does not have a history of stroke or TIA. He has coronary artery disease and previously underwent placement of a stent to the right coronary artery in 2003, coronaries patent by cardiac catheterization in 2013. He has hypertension, diabetes mellitus and hyperlipidemia all of which are well compensated. He has peripheral arterial disease and received a stent to the distal left subclavian artery in January 2014 for left arm claudication (10 x 40 mm Cordis Smart nitinol). He is  also known to have a 50-60% left renal artery stenosis. Echocardiogram and nuclear stress testing have shown reduction in LV systolic function (78% by scintigraphy, 45-50 % by transthoracic echocardiography, 35% by transesophageal echo at the time of his last cardioversion). Nuclear stress testing shows an inferior wall scar without reversible ischemia (Nov 2013).   Past Medical History:  Diagnosis Date  . Acute on chronic combined systolic and diastolic congestive heart failure, NYHA class 2 ([place]) 06/29/2014  . Arthritis    "maybe a little bit in some joints" (08/04/2012)  . Atherosclerotic renal artery stenosis, unilateral ([facility])    lleft renal artery stenosis by duplex ultrasound  . CAD (coronary artery disease),Hx RCA stenting-patent 06/2012  08/05/2012  . Chronic anticoagulation, coumadin for PAF 08/05/2012  . Chronic bronchitis ([place])    "about q yr" (08/04/2012)  . DM (diabetes mellitus) ([place]) 08/05/2012  . External bleeding hemorrhoids ~ 2003; 2008; 2013   "removed polyps and no bleeding since" (08/04/2012)  . GERD (gastroesophageal reflux disease)   . Hip fracture ([place]) 10/2016   RIGHT FEMORAL NECK   . HTN (hypertension) 08/05/2012  . Hypercholesteremia   . Hyperlipidemia 08/05/2012  . Hyperthyroidism   . Kidney stone 1980's  . PAF (paroxysmal atrial fibrillation), maintaining SR 08/05/2012  . Pneumonia ~ 2008  . PVD (peripheral vascular disease) with claudication, Lt Upper ext. pain, and known 80-90%distal Lt. subclavian artery stenosis  08/05/2012  . S/P angioplasty with stent to Lt. subclavian artery -Nitrinol stent 08/04/12 08/05/2012  . Type II diabetes  mellitus ([place])    "take RX; I'm prediabetic; don't have to  check my CBG qd; keep my weight down" (08/04/2012)    Past Surgical History:  Procedure Laterality Date  . CARDIAC CATHETERIZATION  2008 and Dec 2013   patent coronaries  . CARDIOVERSION N/A 07/13/2014   Procedure: CARDIOVERSION;  Surgeon: Sanda Klein, MD;  Location: [facility]  ENDOSCOPY;  Service: Cardiovascular;  Laterality: N/A;  . CARDIOVERSION N/A 09/28/2014   Procedure: CARDIOVERSION;  Surgeon: Dorothy Spark, MD;  Location: [facility];  Service: Cardiovascular;  Laterality: N/A;  . CATARACT EXTRACTION W/ INTRAOCULAR LENS IMPLANT     "right eye" (08/04/2012)  . CORONARY ANGIOPLASTY WITH STENT PLACEMENT  2003   patent 12/13  . GLAUCOMA SURGERY  2000's   "right eye; had laser procedure  to lower the pressure and prevent glaucoma" (08/04/2012)  . HERNIA REPAIR  0240'X   "umbilical" (01/27/5328)  . INGUINAL HERNIA REPAIR  2000's   "right" (08/04/2012)  . KIDNEY STONE SURGERY  1980's  . LEFT HEART CATHETERIZATION WITH CORONARY ANGIOGRAM N/A 06/28/2012   Procedure: LEFT HEART CATHETERIZATION WITH CORONARY ANGIOGRAM;  Surgeon: Lorretta Harp, MD;  [facility] CATH LAB;  Service: Cardiovascular;  Laterality: N/A;  . LITHOTRIPSY  1980's   "imploded in my kidney; ended up having to be cut open in my back" (08/04/2012)  . SKIN CANCER EXCISION  2017 & 2018  . SUBCLAVIAN STENT PLACEMENT  Jan 2014   Lt SCA  . TONSILLECTOMY  1940's  . TOTAL HIP ARTHROPLASTY Right 10/27/2016   Procedure: TOTAL HIP ARTHROPLASTY ANTERIOR APPROACH;  Surgeon: Rod Can, MD;  Location: [facility];  Service: Orthopedics;  Laterality: Right;  . UNILATERAL UPPER EXTREMEITY ANGIOGRAM N/A 08/04/2012   Procedure: UNILATERAL UPPER EXTREMEITY ANGIOGRAM;  Surgeon: Lorretta Harp, MD;  Location: [facility] CATH LAB;  Service: Cardiovascular;  Laterality: N/A;  . UPPER EXTREMITY ANGIOGRAM Bilateral 06/28/2012   Procedure: UPPER EXTREMITY ANGIOGRAM;  Surgeon: Lorretta Harp, MD;  Location: [facility] CATH LAB;  Service: Cardiovascular;  Laterality: Bilateral;    Current Medications: Outpatient Medications Prior to Visit  Medication Sig Dispense Refill  . albuterol (PROVENTIL HFA;VENTOLIN HFA) 108 (90 Base) MCG/ACT inhaler Inhale 2 puffs into the lungs every 6 (six) hours as needed for wheezing or shortness of breath. 1  Inhaler 5  . Ascorbic Acid (VITAMIN C WITH ROSE HIPS) 1000 MG tablet Take 1,000 mg by mouth daily.    Marland Kitchen atorvastatin (LIPITOR) 40 MG tablet Take 1 tablet (40 mg total) by mouth daily. 30 tablet 11  . furosemide (LASIX) 40 MG tablet take 1 tablet by mouth once daily 30 tablet 11  . Hypromellose (GENTEAL MILD) 0.2 % SOLN Apply 2 drops to eye every morning.    Marland Kitchen KRILL OIL PO Take 1 capsule by mouth daily.     Marland Kitchen latanoprost (XALATAN) 0.005 % ophthalmic solution Place 1 drop into both eyes at bedtime.    Marland Kitchen levocetirizine (XYZAL) 5 MG tablet Take 5 mg by mouth daily as needed for allergies.    Marland Kitchen lisinopril (PRINIVIL,ZESTRIL) 40 MG tablet take 1 tablet by mouth once daily 30 tablet 11  . metFORMIN (GLUCOPHAGE-XR) 500 MG 24 hr tablet Take 500 mg by mouth daily with breakfast.    . metoprolol tartrate (LOPRESSOR) 25 MG tablet Take 1 tablet (25 mg total) by mouth 2 (two) times daily. 180 tablet 3  . Multiple Vitamin (MULTIVITAMIN WITH MINERALS) TABS Take 1 tablet by mouth daily.    . naproxen sodium (  ANAPROX) 220 MG tablet Take 220 mg by mouth 2 (two) times daily as needed (PAIN).    Marland Kitchen omeprazole (PRILOSEC) 20 MG capsule Take 20 mg by mouth daily as needed (heartburn).     . polyethylene glycol (MIRALAX / GLYCOLAX) packet Take 17 g by mouth 2 (two) times daily. 14 each 0  . potassium chloride SA (K-DUR,KLOR-CON) 20 MEQ tablet take 1 tablet by mouth once daily if needed ( TAKE WITH LASIX) 30 tablet 5  . propylthiouracil (PTU) 50 MG tablet Take 50 mg by mouth daily.    Marland Kitchen triamcinolone (NASACORT ALLERGY 24HR) 55 MCG/ACT AERO nasal inhaler Place 2 sprays into the nose daily as needed (ALLERGIES).    Marland Kitchen trolamine salicylate (ASPERCREME) 10 % cream Apply 1 application topically as needed for muscle pain.    Marland Kitchen warfarin (COUMADIN) 5 MG tablet take 1 to 1 and 1/2 tablet by mouth as directed BY COUMADIN CLINIC 120 tablet 1  . Capsaicin-Menthol (SALONPAS GEL EX) Apply 1 application topically daily as needed (pain).      Marland Kitchen enoxaparin (LOVENOX) 120 MG/0.8ML injection Inject 0.8 mLs (120 mg total) into the skin daily. (Patient not taking: Reported on 05/07/2017) 2.4 mL 0  . HYDROcodone-acetaminophen (NORCO/VICODIN) 5-325 MG tablet Take 1-2 tablets by mouth every 6 (six) hours as needed for moderate pain. (Patient not taking: Reported on 05/07/2017) 20 tablet 0  . lisinopril (PRINIVIL,ZESTRIL) 40 MG tablet Take 1 tablet (40 mg total) by mouth daily. 90 tablet 3  . OVER THE COUNTER MEDICATION Apply 1 application topically daily as needed (SCARRING CREME).     No facility-administered medications prior to visit.      Allergies:   Patient has no known allergies.   Social History   Social History  . Marital status: Married    Spouse name: N/A  . Number of children: N/A  . Years of education: N/A   Social History Main Topics  . Smoking status: Current Every Day Smoker    Packs/day: 0.50    Years: 25.00    Types: Cigarettes, Pipe  . Smokeless tobacco: Never Used     Comment: 08/04/2012 "still smoke a cigar q once in awhile"  . Alcohol use Yes     Comment: OCCASIONAL  . Drug use: No  . Sexual activity: No   Other Topics Concern  . None   Social History Narrative  . None     Family History:  The patient's family history includes Coronary artery disease (age of onset: 62) in his father; Hypertension in his mother; Stroke (age of onset: 42) in his mother.   ROS:   Please see the history of present illness.    ROS All other systems reviewed and are negative.   PHYSICAL EXAM:   VS:  BP 122/62   Pulse 82   Ht 5\' 6"  (1.676 m)   Wt 167 lb 9.6 oz (76 kg)   BMI 27.05 kg/m    Equal blood pressure right arm and left arm  General: Alert, oriented x3, no distress, looks comfortable Head: no evidence of trauma, PERRL, EOMI, no exophtalmos or lid lag, no myxedema, no xanthelasma; normal ears, nose and oropharynx Neck: normal jugular venous pulsations and no hepatojugular reflux; brisk carotid pulses  without delay and no carotid bruits Chest: clear to auscultation, no signs of consolidation by percussion or palpation, normal fremitus, symmetrical and full respiratory excursions Cardiovascular: normal position and quality of the apical impulse, irregular rhythm, normal first and second heart sounds,  no murmurs, rubs or gallops Abdomen: no tenderness or distention, no masses by palpation, no abnormal pulsatility or arterial bruits, normal bowel sounds, no hepatosplenomegaly Extremities: Several small bruises on his forearms no clubbing, cyanosis or edema; 2+ radial, ulnar and brachial pulses bilaterally; 2+ right femoral, posterior tibial and dorsalis pedis pulses; 2+ left femoral, posterior tibial and dorsalis pedis pulses; no subclavian or femoral bruits Neurological: grossly nonfocal Psych: Normal mood and affect   Wt Readings from Last 3 Encounters:  05/07/17 167 lb 9.6 oz (76 kg)  10/26/16 173 lb 4.5 oz (78.6 kg)  10/16/16 170 lb 9.6 oz (77.4 kg)      Studies/Labs Reviewed:   EKG:  EKG is ordered today.  The ekg ordered today demonstrates atrial fibrillationWith controlled rate and a single PVC, right bundle branch block, left anterior fascicular block, small lateral Q waves, QRS 142 ms, QTC 507 ms  LABS: February 2017 hemoglobin A1c 6.6%, cholesterol 105, triglycerides 85, HDL 25, LDL 64 August 2017, hemoglobin A1c 6.3%, cholesterol 97, triglycerides 73, HDL 25, LDL 57, creatinine 1.27 March 2017 hemoglobin A1c 7.3%, cholesterol 102, triglycerides 50, HDL 24, LDL 67, creatinine 0.93, TSH 1.26, potassium 4.5  ASSESSMENT:    1. Persistent atrial fibrillation ([place])   2. Bradycardia, sinus, persistent, severe   3. Chronic combined systolic and diastolic heart failure, NYHA class 2 ([place])   4. Coronary artery disease involving native coronary artery of native heart without angina pectoris   5. PVD - s/p LSCA stent 08/04/12   6. Mixed hyperlipidemia   7. Type 2 diabetes mellitus  with diabetic peripheral angiopathy without gangrene, without long-term current use of insulin ([place])   8. Pulmonary emphysema, unspecified emphysema type ([place])   9. Chronic anticoagulation, coumadin for PAF      PLAN:  In order of problems listed above:  1. AFib: Good rate control. CHADSVasc 6 (age 67, vascular disease, HTN, DM, CHF). Stopped sotalol due to ineffectiveness and sensitive QT prolongation. Although he has COPD, he also has coronary disease and a beta blocker remains preferable to a calcium channel blocker. Unaware of palpitations 2. SSS: Prior to onset of long-term persistent atrial fibrillation he was showing signs of significant bradycardia, not an issue at this time. Also has evidence of infra-hisian induction abnormalities, but has not had high-grade AV block 3. CHF: Well compensated, NYHA functional class I-II. COPD and heart failure hard to distinguish as the major limiting cause of his dyspnea. Does not have edema. On a relatively low dose of loop diuretic. 4. CAD: Asymptomatic, no angina 5. PAD: Asymptomatic. Not have claudication. Has left subclavian stent. 6. HLP: HDL remains very low, but all his other lipid parameters are well within the desirable range. I don't think he'll be able to increase his level of physical activity and he is relatively lean. 7. DM: Well controlled. A1c briefly above 7% after the holidays, now improved. 8. COPD: Probably the biggest contributor to his exertional dyspnea. If wheezing becomes a big problem, may have to switch from metoprolol to a calcium channel blocker. 9. Warfarin: subtherapeutically anticoagulated today. He has not had a subtherapeutic INR since 2015 and only occasionally has excessively prolonged prothrombin time. Asked him to make sure that his generic warfarin manufacturer has not been changed by his pharmacy.   Medication Adjustments/Labs and Tests Ordered: Current medicines are reviewed at length with the patient today.   Concerns regarding medicines are outlined above.  Medication changes, Labs and Tests ordered today are listed  in the Patient Instructions below. Patient Instructions  Dr Sallyanne Kuster recommends that you schedule a follow-up appointment in 12 months. You will receive a reminder letter in the mail two months in advance. If you don't receive a letter, please call our office to schedule the follow-up appointment.  If you need a refill on your cardiac medications before your next appointment, please call your pharmacy.     Signed, Sanda Klein, MD  05/07/2017 1:51 PM    [facility] [place], [place], [place]  [address] Phone: [phone]; Fax: [phone]

## 2017-05-07 NOTE — Patient Instructions (Addendum)
Dr Croitoru recommends that you schedule a follow-up appointment in 12 months. You will receive a reminder letter in the mail two months in advance. If you don't receive a letter, please call our office to schedule the follow-up appointment.  If you need a refill on your cardiac medications before your next appointment, please call your pharmacy. 

## 2017-05-28 ENCOUNTER — Ambulatory Visit (INDEPENDENT_AMBULATORY_CARE_PROVIDER_SITE_OTHER): Payer: Medicare Other | Admitting: Pharmacist Clinician (PhC)/ Clinical Pharmacy Specialist

## 2017-05-28 DIAGNOSIS — I480 Paroxysmal atrial fibrillation: Secondary | ICD-10-CM

## 2017-05-28 DIAGNOSIS — I4819 Other persistent atrial fibrillation: Secondary | ICD-10-CM

## 2017-05-28 DIAGNOSIS — I481 Persistent atrial fibrillation: Secondary | ICD-10-CM | POA: Diagnosis not present

## 2017-05-28 DIAGNOSIS — Z5181 Encounter for therapeutic drug level monitoring: Secondary | ICD-10-CM | POA: Diagnosis not present

## 2017-05-28 DIAGNOSIS — Z7901 Long term (current) use of anticoagulants: Secondary | ICD-10-CM | POA: Diagnosis not present

## 2017-05-28 LAB — POCT INR: INR: 1.9

## 2017-06-11 ENCOUNTER — Ambulatory Visit (INDEPENDENT_AMBULATORY_CARE_PROVIDER_SITE_OTHER): Payer: Medicare Other | Admitting: Pharmacist

## 2017-06-11 DIAGNOSIS — I481 Persistent atrial fibrillation: Secondary | ICD-10-CM

## 2017-06-11 DIAGNOSIS — Z7901 Long term (current) use of anticoagulants: Secondary | ICD-10-CM

## 2017-06-11 DIAGNOSIS — I4819 Other persistent atrial fibrillation: Secondary | ICD-10-CM

## 2017-06-11 DIAGNOSIS — Z5181 Encounter for therapeutic drug level monitoring: Secondary | ICD-10-CM | POA: Diagnosis not present

## 2017-06-11 DIAGNOSIS — I480 Paroxysmal atrial fibrillation: Secondary | ICD-10-CM

## 2017-06-11 LAB — POCT INR: INR: 2

## 2017-06-20 ENCOUNTER — Other Ambulatory Visit: Payer: Self-pay | Admitting: Cardiovascular Disease

## 2017-07-09 ENCOUNTER — Ambulatory Visit (INDEPENDENT_AMBULATORY_CARE_PROVIDER_SITE_OTHER): Payer: Medicare Other | Admitting: Pharmacist

## 2017-07-09 DIAGNOSIS — I481 Persistent atrial fibrillation: Secondary | ICD-10-CM

## 2017-07-09 DIAGNOSIS — Z7901 Long term (current) use of anticoagulants: Secondary | ICD-10-CM

## 2017-07-09 DIAGNOSIS — I4819 Other persistent atrial fibrillation: Secondary | ICD-10-CM

## 2017-07-09 LAB — POCT INR: INR: 2.8

## 2017-07-09 NOTE — Patient Instructions (Signed)
Description   Continue taking1 tablet daily, repeat INR in 5 weeks

## 2017-08-13 ENCOUNTER — Ambulatory Visit (INDEPENDENT_AMBULATORY_CARE_PROVIDER_SITE_OTHER): Payer: Medicare Other | Admitting: Pharmacist Clinician (PhC)/ Clinical Pharmacy Specialist

## 2017-08-13 DIAGNOSIS — I4819 Other persistent atrial fibrillation: Secondary | ICD-10-CM

## 2017-08-13 DIAGNOSIS — I481 Persistent atrial fibrillation: Secondary | ICD-10-CM | POA: Diagnosis not present

## 2017-08-13 DIAGNOSIS — Z5181 Encounter for therapeutic drug level monitoring: Secondary | ICD-10-CM | POA: Diagnosis not present

## 2017-08-13 DIAGNOSIS — I480 Paroxysmal atrial fibrillation: Secondary | ICD-10-CM

## 2017-08-13 DIAGNOSIS — Z7901 Long term (current) use of anticoagulants: Secondary | ICD-10-CM | POA: Diagnosis not present

## 2017-08-13 LAB — POCT INR: INR: 2.3

## 2017-09-24 ENCOUNTER — Ambulatory Visit (INDEPENDENT_AMBULATORY_CARE_PROVIDER_SITE_OTHER): Payer: Medicare Other | Admitting: Pharmacist

## 2017-09-24 DIAGNOSIS — I481 Persistent atrial fibrillation: Secondary | ICD-10-CM | POA: Diagnosis not present

## 2017-09-24 DIAGNOSIS — I4819 Other persistent atrial fibrillation: Secondary | ICD-10-CM

## 2017-09-24 DIAGNOSIS — I480 Paroxysmal atrial fibrillation: Secondary | ICD-10-CM

## 2017-09-24 DIAGNOSIS — Z5181 Encounter for therapeutic drug level monitoring: Secondary | ICD-10-CM

## 2017-09-24 DIAGNOSIS — Z7901 Long term (current) use of anticoagulants: Secondary | ICD-10-CM

## 2017-09-24 LAB — POCT INR: INR: 1.8

## 2017-10-22 ENCOUNTER — Ambulatory Visit (INDEPENDENT_AMBULATORY_CARE_PROVIDER_SITE_OTHER): Payer: Medicare Other | Admitting: Pharmacist

## 2017-10-22 DIAGNOSIS — I481 Persistent atrial fibrillation: Secondary | ICD-10-CM

## 2017-10-22 DIAGNOSIS — Z7901 Long term (current) use of anticoagulants: Secondary | ICD-10-CM

## 2017-10-22 DIAGNOSIS — I480 Paroxysmal atrial fibrillation: Secondary | ICD-10-CM | POA: Diagnosis not present

## 2017-10-22 DIAGNOSIS — Z5181 Encounter for therapeutic drug level monitoring: Secondary | ICD-10-CM | POA: Diagnosis not present

## 2017-10-22 DIAGNOSIS — I4819 Other persistent atrial fibrillation: Secondary | ICD-10-CM

## 2017-10-22 LAB — POCT INR: INR: 3.5

## 2017-10-22 MED ORDER — APIXABAN 5 MG PO TABS: 5.0000 mg | ORAL_TABLET | Freq: Two times a day (BID) | ORAL | 1 refills | Status: DC

## 2017-10-22 NOTE — Patient Instructions (Signed)
STOP taking warfarin ; start Eliquis 5mg  twice daily - 1st tomorrow 10/23/17 in the mornig

## 2017-12-09 ENCOUNTER — Other Ambulatory Visit: Payer: Self-pay | Admitting: Cardiovascular Disease

## 2017-12-17 ENCOUNTER — Other Ambulatory Visit: Payer: Self-pay | Admitting: Cardiovascular Disease

## 2017-12-17 NOTE — Telephone Encounter (Signed)
Rx request sent to pharmacy.  

## 2017-12-24 ENCOUNTER — Other Ambulatory Visit: Payer: Self-pay | Admitting: *Deleted

## 2017-12-24 MED ORDER — METOPROLOL TARTRATE 25 MG PO TABS: 12.5000 mg | ORAL_TABLET | Freq: Two times a day (BID) | ORAL | 2 refills | Status: DC

## 2018-03-03 ENCOUNTER — Other Ambulatory Visit: Payer: Self-pay | Admitting: Cardiovascular Disease

## 2018-03-03 NOTE — Telephone Encounter (Signed)
Rx sent to pharmacy   

## 2018-05-12 ENCOUNTER — Encounter: Payer: Self-pay | Admitting: Cardiovascular Disease

## 2018-05-12 ENCOUNTER — Ambulatory Visit: Payer: Medicare Other | Admitting: Cardiovascular Disease

## 2018-05-12 VITALS — BP 155/88 | HR 90 | Ht 65.5 in | Wt 165.0 lb

## 2018-05-12 DIAGNOSIS — I4819 Other persistent atrial fibrillation: Secondary | ICD-10-CM

## 2018-05-12 DIAGNOSIS — I739 Peripheral vascular disease, unspecified: Secondary | ICD-10-CM

## 2018-05-12 DIAGNOSIS — I5042 Chronic combined systolic (congestive) and diastolic (congestive) heart failure: Secondary | ICD-10-CM | POA: Diagnosis not present

## 2018-05-12 DIAGNOSIS — J432 Centrilobular emphysema: Secondary | ICD-10-CM | POA: Diagnosis not present

## 2018-05-12 DIAGNOSIS — E119 Type 2 diabetes mellitus without complications: Secondary | ICD-10-CM

## 2018-05-12 DIAGNOSIS — Z7901 Long term (current) use of anticoagulants: Secondary | ICD-10-CM

## 2018-05-12 DIAGNOSIS — I2510 Atherosclerotic heart disease of native coronary artery without angina pectoris: Secondary | ICD-10-CM

## 2018-05-12 DIAGNOSIS — E785 Hyperlipidemia, unspecified: Secondary | ICD-10-CM

## 2018-05-12 MED ORDER — POTASSIUM CHLORIDE CRYS ER 20 MEQ PO TBCR: 20.0000 meq | EXTENDED_RELEASE_TABLET | Freq: Every day | ORAL | 3 refills | Status: DC

## 2018-05-12 MED ORDER — FUROSEMIDE 40 MG PO TABS: 40.0000 mg | ORAL_TABLET | Freq: Every day | ORAL | 3 refills | Status: DC

## 2018-05-12 MED ORDER — METOPROLOL TARTRATE 25 MG PO TABS: 25.0000 mg | ORAL_TABLET | Freq: Two times a day (BID) | ORAL | 3 refills | Status: DC

## 2018-05-12 MED ORDER — APIXABAN 5 MG PO TABS: 5.0000 mg | ORAL_TABLET | Freq: Two times a day (BID) | ORAL | 1 refills | Status: DC

## 2018-05-12 NOTE — Progress Notes (Signed)
Patient ID: JOHNATON SONNEBORN, male   DOB: 1935/04/03, 83 y.o.   MRN: [id number]    Cardiology Office Note    Date:  05/13/2018   ID:  [name], DOB 1935-06-06, MRN [id number]  PCP:  Antony Contras, MD  Cardiologist:   Sanda Klein, MD   No chief complaint on file.   History of Present Illness:  EMANI MORAD is a 83 y.o. male with a long-standing history of atrial arrhythmia (persistent atrial fibrillation and atrial flutter requiring cardioversion in the past), now probably permanent atrial fibrillation, peripheral arterial disease, coronary artery disease (last revascularization procedure left subclavian stent 2014), controlled type 2 diabetes mellitus and hypertension.  He is made a tremendous recovery from his hip joint replacement and is back to working 30 hours a week.  He does not require a walker or cane.  He has exertional dyspnea, functional class II, unchanged from before.  He denies angina at rest or with activity.  He has not had syncope or palpitations and denies lower extremity edema.  He is not requiring adjustment in his dose of diuretics.  He uses an inhaler roughly once a week and takes a single Aleve tablet about once a week when his back is hurting.  No problems with bleeding, falls or injuries.  Denies intermittent claudication.  Compliant with Eliquis.  Blood pressure was elevated when he was initially checked in, but was only 127/64 when I saw him.  Recent labs with his PCP showed an LDL cholesterol 65, hemoglobin A1c 6.9, creatinine 1.14.  He does not have a history of stroke or TIA. He has coronary artery disease and previously underwent placement of a stent to the right coronary artery in 2003, coronaries patent by cardiac catheterization in 2013. He has hypertension, diabetes mellitus and hyperlipidemia all of which are well compensated. He has peripheral arterial disease and received a stent to the distal left subclavian artery in January 2014 for left arm  claudication (10 x 40 mm Cordis Smart nitinol). He is also known to have a 50-60% left renal artery stenosis. Echocardiogram and nuclear stress testing have shown reduction in LV systolic function (62% by scintigraphy, 45-50 % by transthoracic echocardiography, 35% by transesophageal echo at the time of his last cardioversion). Nuclear stress testing shows an inferior wall scar without reversible ischemia (Nov 2013).   Past Medical History:  Diagnosis Date  . Acute on chronic combined systolic and diastolic congestive heart failure, NYHA class 2 ([place]) 06/29/2014  . Arthritis    "maybe a little bit in some joints" (08/04/2012)  . Atherosclerotic renal artery stenosis, unilateral (H[facility])    lleft renal artery stenosis by duplex ultrasound  . CAD (coronary artery disease),Hx RCA stenting-patent 06/2012  08/05/2012  . Chronic anticoagulation, coumadin for PAF 08/05/2012  . Chronic bronchitis ([place])    "about q yr" (08/04/2012)  . DM (diabetes mellitus) ([place]) 08/05/2012  . External bleeding hemorrhoids ~ 2003; 2008; 2013   "removed polyps and no bleeding since" (08/04/2012)  . GERD (gastroesophageal reflux disease)   . Hip fracture ([place]) 10/2016   RIGHT FEMORAL NECK   . HTN (hypertension) 08/05/2012  . Hypercholesteremia   . Hyperlipidemia 08/05/2012  . Hyperthyroidism   . Kidney stone 1980's  . PAF (paroxysmal atrial fibrillation), maintaining SR 08/05/2012  . Pneumonia ~ 2008  . PVD (peripheral vascular disease) with claudication, Lt Upper ext. pain, and known 80-90%distal Lt. subclavian artery stenosis  08/05/2012  . S/P angioplasty with stent to Lt. subclavian  artery -Nitrinol stent 08/04/12 08/05/2012  . Type II diabetes mellitus ([place])    "take RX; I'm prediabetic; don't have to  check my CBG qd; keep my weight down" (08/04/2012)    Past Surgical History:  Procedure Laterality Date  . CARDIAC CATHETERIZATION  2008 and Dec 2013   patent coronaries  . CARDIOVERSION N/A 07/13/2014   Procedure:  CARDIOVERSION;  Surgeon: Sanda Klein, MD;  Location: [facility];  Service: Cardiovascular;  Laterality: N/A;  . CARDIOVERSION N/A 09/28/2014   Procedure: CARDIOVERSION;  Surgeon: Dorothy Spark, MD;  Location: [facility];  Service: Cardiovascular;  Laterality: N/A;  . CATARACT EXTRACTION W/ INTRAOCULAR LENS IMPLANT     "right eye" (08/04/2012)  . CORONARY ANGIOPLASTY WITH STENT PLACEMENT  2003   patent 12/13  . GLAUCOMA SURGERY  2000's   "right eye; had laser procedure  to lower the pressure and prevent glaucoma" (08/04/2012)  . HERNIA REPAIR  1448'J   "umbilical" (02/28/6313)  . INGUINAL HERNIA REPAIR  2000's   "right" (08/04/2012)  . KIDNEY STONE SURGERY  1980's  . LEFT HEART CATHETERIZATION WITH CORONARY ANGIOGRAM N/A 06/28/2012   Procedure: LEFT HEART CATHETERIZATION WITH CORONARY ANGIOGRAM;  Surgeon: Lorretta Harp, MD;  Location[facility] C[facility] LAB;  Service: Cardiovascular;  Laterality: N/A;  . LITHOTRIPSY  1980's   "imploded in my kidney; ended up having to be cut open in my back" (08/04/2012)  . SKIN CANCER EXCISION  2017 & 2018  . SUBCLAVIAN STENT PLACEMENT  Jan 2014   Lt SCA  . TONSILLECTOMY  1940's  . TOTAL HIP ARTHROPLASTY Right 10/27/2016   Procedure: TOTAL HIP ARTHROPLASTY ANTERIOR APPROACH;  Surgeon: Rod Can, MD;  Location: [facility];  Service: Orthopedics;  Laterality: Right;  . UNILATERAL UPPER EXTREMEITY ANGIOGRAM N/A 08/04/2012   Procedure: UNILATERAL UPPER EXTREMEITY ANGIOGRAM;  Surgeon: Lorretta Harp, MD;  [facility] C[facility] LAB;  Service: Cardiovascular;  Laterality: N/A;  . UPPER EXTREMITY ANGIOGRAM Bilateral 06/28/2012   Procedure: UPPER EXTREMITY ANGIOGRAM;  Surgeon: Lorretta Harp, MD;  Location: [facility] C[facility] LAB;  Service: Cardiovascular;  Laterality: Bilateral;    Current Medications: Outpatient Medications Prior to Visit  Medication Sig Dispense Refill  . albuterol (PROVENTIL HFA;VENTOLIN HFA) 108 (90 Base) MCG/ACT inhaler Inhale 2 puffs into the lungs every 6  (six) hours as needed for wheezing or shortness of breath. 1 Inhaler 5  . Ascorbic Acid (VITAMIN C WITH ROSE HIPS) 1000 MG tablet Take 1,000 mg by mouth daily.    Marland Kitchen atorvastatin (LIPITOR) 40 MG tablet Take 1 tablet (40 mg total) by mouth daily. 30 tablet 11  . Hypromellose (GENTEAL MILD) 0.2 % SOLN Apply 2 drops to eye as needed.     Marland Kitchen KRILL OIL PO Take 1 capsule by mouth daily.     Marland Kitchen latanoprost (XALATAN) 0.005 % ophthalmic solution Place 1 drop into both eyes at bedtime.    Marland Kitchen levocetirizine (XYZAL) 5 MG tablet Take 5 mg by mouth daily as needed for allergies.    Marland Kitchen losartan (COZAAR) 100 MG tablet Take 100 mg by mouth daily.    . metFORMIN (GLUCOPHAGE-XR) 500 MG 24 hr tablet Take 500 mg by mouth daily with breakfast.    . Multiple Vitamin (MULTIVITAMIN WITH MINERALS) TABS Take 1 tablet by mouth daily.    . naproxen sodium (ANAPROX) 220 MG tablet Take 220 mg by mouth 2 (two) times daily as needed (PAIN).    Marland Kitchen omeprazole (PRILOSEC) 20 MG capsule Take 20 mg by mouth daily  as needed (heartburn).     . propylthiouracil (PTU) 50 MG tablet Take 50 mg by mouth daily.    Marland Kitchen triamcinolone (NASACORT ALLERGY 24HR) 55 MCG/ACT AERO nasal inhaler Place 2 sprays into the nose daily as needed (ALLERGIES).    Marland Kitchen trolamine salicylate (ASPERCREME) 10 % cream Apply 1 application topically as needed for muscle pain.    Marland Kitchen ELIQUIS 5 MG TABS tablet TAKE 1 TABLET BY MOUTH TWICE DAILY TO REPLACE WARFARIN 60 tablet 6  . furosemide (LASIX) 40 MG tablet TAKE 1 TABLET BY MOUTH ONCE DAILY 30 tablet 10  . metoprolol tartrate (LOPRESSOR) 25 MG tablet TAKE 1 TABLET BY MOUTH TWICE DAILY 180 tablet 0  . potassium chloride SA (K-DUR,KLOR-CON) 20 MEQ tablet take 1 tablet by mouth once daily if needed ( TAKE WITH LASIX) 30 tablet 5  . lisinopril (PRINIVIL,ZESTRIL) 40 MG tablet take 1 tablet by mouth once daily (Patient not taking: Reported on 05/12/2018) 30 tablet 11  . polyethylene glycol (MIRALAX / GLYCOLAX) packet Take 17 g by mouth 2  (two) times daily. (Patient not taking: Reported on 05/12/2018) 14 each 0   No facility-administered medications prior to visit.      Allergies:   Patient has no known allergies.   Social History   Socioeconomic History  . Marital status: Married    Spouse name: Not on file  . Number of children: Not on file  . Years of education: Not on file  . Highest education level: Not on file  Occupational History  . Not on file  Social Needs  . Financial resource strain: Not on file  . Food insecurity:    Worry: Not on file    Inability: Not on file  . Transportation needs:    Medical: Not on file    Non-medical: Not on file  Tobacco Use  . Smoking status: Current Every Day Smoker    Packs/day: 0.50    Years: 25.00    Pack years: 12.50    Types: Cigarettes, Pipe  . Smokeless tobacco: Never Used  . Tobacco comment: 08/04/2012 "still smoke a cigar q once in awhile"  Substance and Sexual Activity  . Alcohol use: Yes    Comment: OCCASIONAL  . Drug use: No  . Sexual activity: Never  Lifestyle  . Physical activity:    Days per week: Not on file    Minutes per session: Not on file  . Stress: Not on file  Relationships  . Social connections:    Talks on phone: Not on file    Gets together: Not on file    Attends religious service: Not on file    Active member of club or organization: Not on file    Attends meetings of clubs or organizations: Not on file    Relationship status: Not on file  Other Topics Concern  . Not on file  Social History Narrative  . Not on file     Family History:  The patient's family history includes Coronary artery disease (age of onset: 64) in his father; Hypertension in his mother; Stroke (age of onset: 63) in his mother.   ROS:   Please see the history of present illness.    ROS other systems reviewed and are negative.   PHYSICAL EXAM:   VS:  BP (!) 155/88   Pulse 90   Ht 5' 5.5" (1.664 m)   Wt 165 lb (74.8 kg)   BMI 27.04 kg/m    Equal  blood pressure  right and left upper extremity.  Recheck blood pressure 127/64   General: Alert, oriented x3, no distress, appears well Head: no evidence of trauma, PERRL, EOMI, no exophtalmos or lid lag, no myxedema, no xanthelasma; normal ears, nose and oropharynx Neck: normal jugular venous pulsations and no hepatojugular reflux; brisk carotid pulses without delay and no carotid bruits Chest: Markedly reduced breath sounds bilaterally, but without wheezes or rales,, no signs of consolidation by percussion or palpation, normal fremitus, symmetrical and full respiratory excursions Cardiovascular: normal position and quality of the apical impulse, irregular rhythm, normal first and second heart sounds, no murmurs, rubs or gallops Abdomen: no tenderness or distention, no masses by palpation, no abnormal pulsatility or arterial bruits, normal bowel sounds, no hepatosplenomegaly Extremities: no clubbing, cyanosis or edema; 2+ radial, ulnar and brachial pulses bilaterally; 2+ right femoral, posterior tibial and dorsalis pedis pulses; 2+ left femoral, posterior tibial and dorsalis pedis pulses; no subclavian or femoral bruits Neurological: grossly nonfocal Psych: Normal mood and affect   Wt Readings from Last 3 Encounters:  05/12/18 165 lb (74.8 kg)  05/07/17 167 lb 9.6 oz (76 kg)  10/26/16 173 lb 4.5 oz (78.6 kg)      Studies/Labs Reviewed:   EKG:  EKG is ordered today.  It shows atrial fibrillation with controlled rate, right bundle branch block/left anterior fascicular block (QRS 140 ms), Q waves in the lateral and inferior leads, QTC 409 9 ms, not significantly changed  LABS: February 2017 hemoglobin A1c 6.6%, cholesterol 105, triglycerides 85, HDL 25, LDL 64 August 2017, hemoglobin A1c 6.3%, cholesterol 97, triglycerides 73, HDL 25, LDL 57, creatinine 1.27 March 2017 hemoglobin A1c 7.3%, cholesterol 102, triglycerides 50, HDL 24, LDL 67, creatinine 0.93, TSH 1.26, potassium  4.31 March 2018 hemoglobin A1c 6.9%, cholesterol 106, HDL 28, LDL 65, triglycerides 69, creatinine 1.14, potassium 4.7  ASSESSMENT:    1. Other persistent atrial fibrillation   2. Chronic combined systolic and diastolic heart failure, NYHA class 2 ([place])   3. Centrilobular emphysema ([place])   4. Coronary artery disease involving native coronary artery of native heart without angina pectoris   5. PAD (peripheral artery disease) ([place])   6. Dyslipidemia (high LDL; low HDL)   7. Diabetes mellitus type 2 in nonobese (HCC)   8. Long term current use of anticoagulant      PLAN:  In order of problems listed above:  1. AFib: Seems to have settled in a pattern of permanent arrhythmia. CHADSVasc 6 (age 20, vascular disease, HTN, DM, CHF).  Appears to be oblivious to the arrhythmia.  Rate control is adequate.  Note that prior to onset of atrial fibrillation he had a lot of issues with bradycardia and has bifascicular block.  Possible need for pacemaker in the future. 2. CHF: Well compensated, NYHA functional class II. COPD and heart failure hard to distinguish as the major limiting cause of his dyspnea. Does not have edema. On a relatively low dose of loop diuretic.  No changes made to his medications: He is on maximum dose losartan and an appropriate dose of beta-blocker for rate control.  Delene Loll would be an option if his EF were lower than 40%. 3. COPD: Probably the biggest contributor to his exertional dyspnea. If wheezing becomes a big problem, may have to switch from metoprolol to a calcium channel blocker. 4. CAD: Denies angina 5. PAD: Denies claudication. Has left subclavian stent. 6. HLP: Excellent LDL, HDL remains low despite the fact that he is quite active  and is only mildly overweight 7. DM: Well-controlled, hemoglobin A1c consistently less than 7% 8.  Eliquis: Well-tolerated without bleeding complications.   Medication Adjustments/Labs and Tests Ordered: Current medicines are  reviewed at length with the patient today.  Concerns regarding medicines are outlined above.  Medication changes, Labs and Tests ordered today are listed in the Patient Instructions below. Patient Instructions  Medication Instructions:  Dr Sallyanne Kuster recommends that you continue on your current medications as directed. Please refer to the Current Medication list given to you today.  If you need a refill on your cardiac medications before your next appointment, please call your pharmacy.   Follow-Up: At [facility], you and your health needs are our priority.  As part of our continuing mission to provide you with exceptional heart care, we have created designated Provider Care Teams.  These Care Teams include your primary Cardiologist (physician) and Advanced Practice Providers (APPs -  Physician Assistants and Nurse Practitioners) who all work together to provide you with the care you need, when you need it. You will need a follow up appointment in 12 months.  Please call our office 2 months in advance to schedule this appointment.  You may see Sanda Klein, MD or one of the following Advanced Practice Providers on your designated Care Team: [place], [place] . Fabian Sharp, PA-C    Signed, Sanda Klein, MD  05/13/2018 4:17 PM    [facility] [address], [place], [place]  [address] Phone: [phone]; Fax: [phone]

## 2018-05-12 NOTE — Patient Instructions (Signed)
Medication Instructions:  Dr Croitoru recommends that you continue on your current medications as directed. Please refer to the Current Medication list given to you today.  If you need a refill on your cardiac medications before your next appointment, please call your pharmacy.   Follow-Up: At [facility], you and your health needs are our priority.  As part of our continuing mission to provide you with exceptional heart care, we have created designated Provider Care Teams.  These Care Teams include your primary Cardiologist (physician) and Advanced Practice Providers (APPs -  Physician Assistants and Nurse Practitioners) who all work together to provide you with the care you need, when you need it. You will need a follow up appointment in 12 months.  Please call our office 2 months in advance to schedule this appointment.  You may see Mihai Croitoru, MD or one of the following Advanced Practice Providers on your designated Care Team: Hao Meng, PA-C . Jerzee Jerome Duke, PA-C 

## 2018-05-20 ENCOUNTER — Other Ambulatory Visit: Payer: Self-pay | Admitting: Family Medicine

## 2018-05-20 ENCOUNTER — Ambulatory Visit
Admission: RE | Admit: 2018-05-20 | Discharge: 2018-05-20 | Disposition: A | Discharge status: Home / Self Care | Payer: Medicare Other | Source: Ambulatory Visit | Attending: Family Medicine | Admitting: Family Medicine

## 2018-05-20 DIAGNOSIS — R509 Fever, unspecified: Secondary | ICD-10-CM

## 2018-05-25 ENCOUNTER — Other Ambulatory Visit: Payer: Self-pay | Admitting: Family Medicine

## 2018-05-25 DIAGNOSIS — R7989 Other specified abnormal findings of blood chemistry: Secondary | ICD-10-CM

## 2018-05-25 DIAGNOSIS — R945 Abnormal results of liver function studies: Principal | ICD-10-CM

## 2018-05-27 ENCOUNTER — Ambulatory Visit (HOSPITAL_COMMUNITY)
Admission: RE | Admit: 2018-05-27 | Discharge: 2018-05-27 | Disposition: A | Discharge status: Home / Self Care | Payer: Medicare Other | Source: Ambulatory Visit | Attending: Surgery | Admitting: Surgery

## 2018-05-27 ENCOUNTER — Other Ambulatory Visit (HOSPITAL_COMMUNITY): Payer: Self-pay | Admitting: Surgery

## 2018-05-27 ENCOUNTER — Telehealth: Payer: Self-pay

## 2018-05-27 DIAGNOSIS — R17 Unspecified jaundice: Secondary | ICD-10-CM | POA: Insufficient documentation

## 2018-05-27 DIAGNOSIS — I10 Essential (primary) hypertension: Secondary | ICD-10-CM

## 2018-05-27 DIAGNOSIS — K8020 Calculus of gallbladder without cholecystitis without obstruction: Secondary | ICD-10-CM | POA: Diagnosis not present

## 2018-05-27 DIAGNOSIS — J9811 Atelectasis: Secondary | ICD-10-CM | POA: Insufficient documentation

## 2018-05-27 DIAGNOSIS — N281 Cyst of kidney, acquired: Secondary | ICD-10-CM | POA: Diagnosis not present

## 2018-05-27 DIAGNOSIS — I700 Atherosclerosis of aorta: Secondary | ICD-10-CM | POA: Insufficient documentation

## 2018-05-27 DIAGNOSIS — J90 Pleural effusion, not elsewhere classified: Secondary | ICD-10-CM | POA: Insufficient documentation

## 2018-05-27 DIAGNOSIS — I5043 Acute on chronic combined systolic (congestive) and diastolic (congestive) heart failure: Secondary | ICD-10-CM

## 2018-05-27 MED ORDER — GADOBUTROL 1 MMOL/ML IV SOLN
7.5000 mL | Freq: Once | INTRAVENOUS | Status: AC | PRN
  Administered 2018-05-27: 7.5 mL via INTRAVENOUS

## 2018-05-27 NOTE — Telephone Encounter (Signed)
   [facility] Pre-operative Risk Assessment    Request for surgical clearance:  1. What type of surgery is being performed? Gallbladder removal   2. When is this surgery scheduled?  TBD   3. What type of clearance is required (medical clearance vs. Pharmacy clearance to hold med vs. Both)? Both  4. Are there any medications that need to be held prior to surgery and how long? Eliquis 5 Mg   5. Practice name and name of physician performing surgery? [facility], Dr. Alphonsa Overall   6. What is your office phone number? [phone]    7.   What is your office fax number? [phone]  8.   Anesthesia type (None, local, MAC, general) ? General   Jacqulynn Cadet 05/27/2018, 4:10 PM  _________________________________________________________________   (provider comments below)

## 2018-05-30 LAB — POCT I-STAT CREATININE: CREATININE: 1.5 mg/dL — AB (ref 0.61–1.24)

## 2018-05-30 NOTE — Telephone Encounter (Signed)
Pt takes Eliquis for afib with CHADS2VASc score of 6 (age x2, CHF, HTN, DM, CAD). CrCl is 64mL/min, Scr 1.5. Ok to hold Eliquis 2-3 days prior to procedure.

## 2018-05-31 NOTE — Telephone Encounter (Signed)
   Primary Cardiologist: Sanda Klein, MD  Chart reviewed as part of pre-operative protocol coverage. Patient was contacted 05/31/2018 in reference to pre-operative risk assessment for pending surgery as outlined below.  Shaun Mclaughlin was last seen on 05/12/2018 by Dr. Sallyanne Kuster.  Since that day, RAMY GRETH has done well from a cardiac standpoint.  He has NYHA functional class II heart failure, well compensated.  He says that he does have pneumonia and he should be cleared by his PCP regarding this.  He is not having any chest discomfort or increase in his baseline dyspnea on exertion.  He has no awareness of his chronic A. Fib. He did very well with hip replacement last year.  He will need close monitoring surrounding surgery and attention to his fluid balance.  Therefore, based on ACC/AHA guidelines, the patient would be at acceptable risk for the planned procedure without further cardiovascular testing.   According to our pharmacy protocol: Pt takes Eliquis for afib with CHADS2VASc score of 6 (age x2, CHF, HTN, DM, CAD). CrCl is 46mL/min, Scr 1.5. Ok to hold Eliquis 2-3 days prior to procedure.  I will route this recommendation to the requesting party via Epic fax function and remove from pre-op pool.  Please call with questions.  Daune Perch, NP 05/31/2018, 4:42 PM

## 2018-06-01 ENCOUNTER — Telehealth: Payer: Self-pay

## 2018-06-01 NOTE — Telephone Encounter (Signed)
OK to resume furosemide 40 mg daily and recheck BMET in 2 weeks, please. When you call, please ask him for his weight today. Shaun Mclaughlin

## 2018-06-01 NOTE — Telephone Encounter (Signed)
Spoke to patient he stated PCP Dr.Swayne wanted him ask Dr.Croitoru if ok to restart Lasix.Stated Dr.Swayne stopped 2 weeks ago due to weak kidneys.Stated he had bmet repeated 05/30/18 at [facility] office creatinine 0.97 bun 17.Office to fax results.Stated he is having swelling in both feet.Message sent to Dr.Croitoru for advice.

## 2018-06-01 NOTE — Telephone Encounter (Signed)
   Primary Cardiologist: Sanda Klein, MD  Chart received to pre-op box. This box is only to determine pre-op clearances, not to handle triage questions. Will route to NL triage to further handle. I will also cc to sender to make them aware.  This message will be removed from the pre-op box.    Charlie Pitter, PA-C 06/01/2018, 2:19 PM

## 2018-06-01 NOTE — Telephone Encounter (Signed)
Thank you MCr 

## 2018-06-01 NOTE — Telephone Encounter (Signed)
SENT REFERRAL TO SCHEDULING AND FILED NOTES 

## 2018-06-01 NOTE — Telephone Encounter (Signed)
Follow up   Patient would like to know if you should continue to lasix. Patient states that he is not taking the lasix right now. Patient wants to know if he should stay off lasix or start taking again?

## 2018-06-01 NOTE — Telephone Encounter (Signed)
Returned call to patient Dr.Croitoru's advice given.Advised to have bmet in 2 weeks.Patient stated he weighed 160 lbs this morning.Stated that is close to his normal weight.

## 2018-06-07 IMAGING — DX DG HIP (WITH OR WITHOUT PELVIS) 2-3V*R*
4 series · 4 of 4 positions shown · non-contrast
Comparison: None.

CLINICAL DATA: Right hip pain after fall.  Initial encounter.

EXAM:
DG HIP (WITH OR WITHOUT PELVIS) 2-3V RIGHT

[pelvis ap]
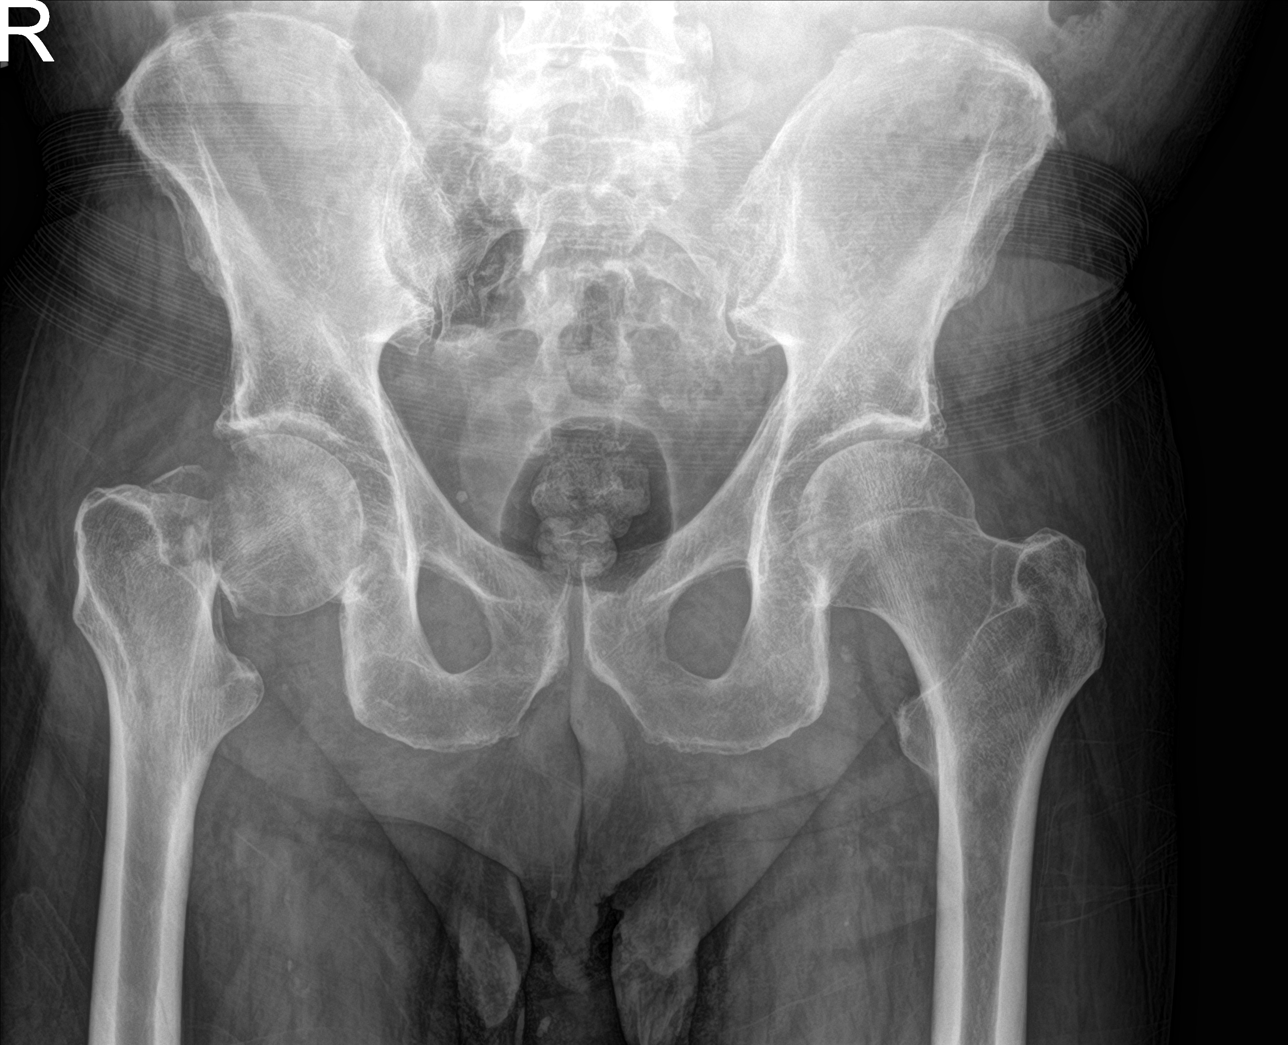

[hip ap]
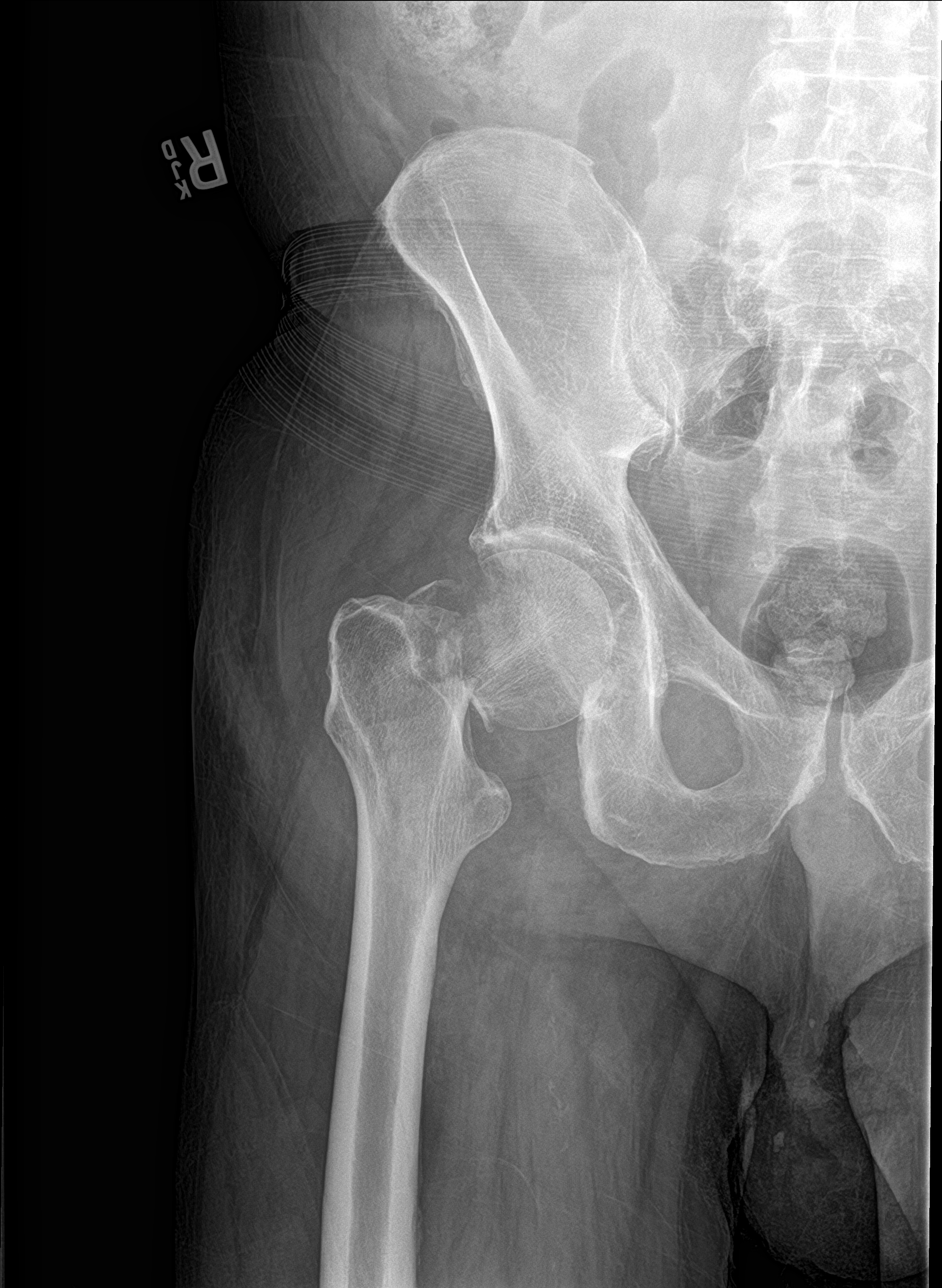

[hip x-table (1 of 2)]
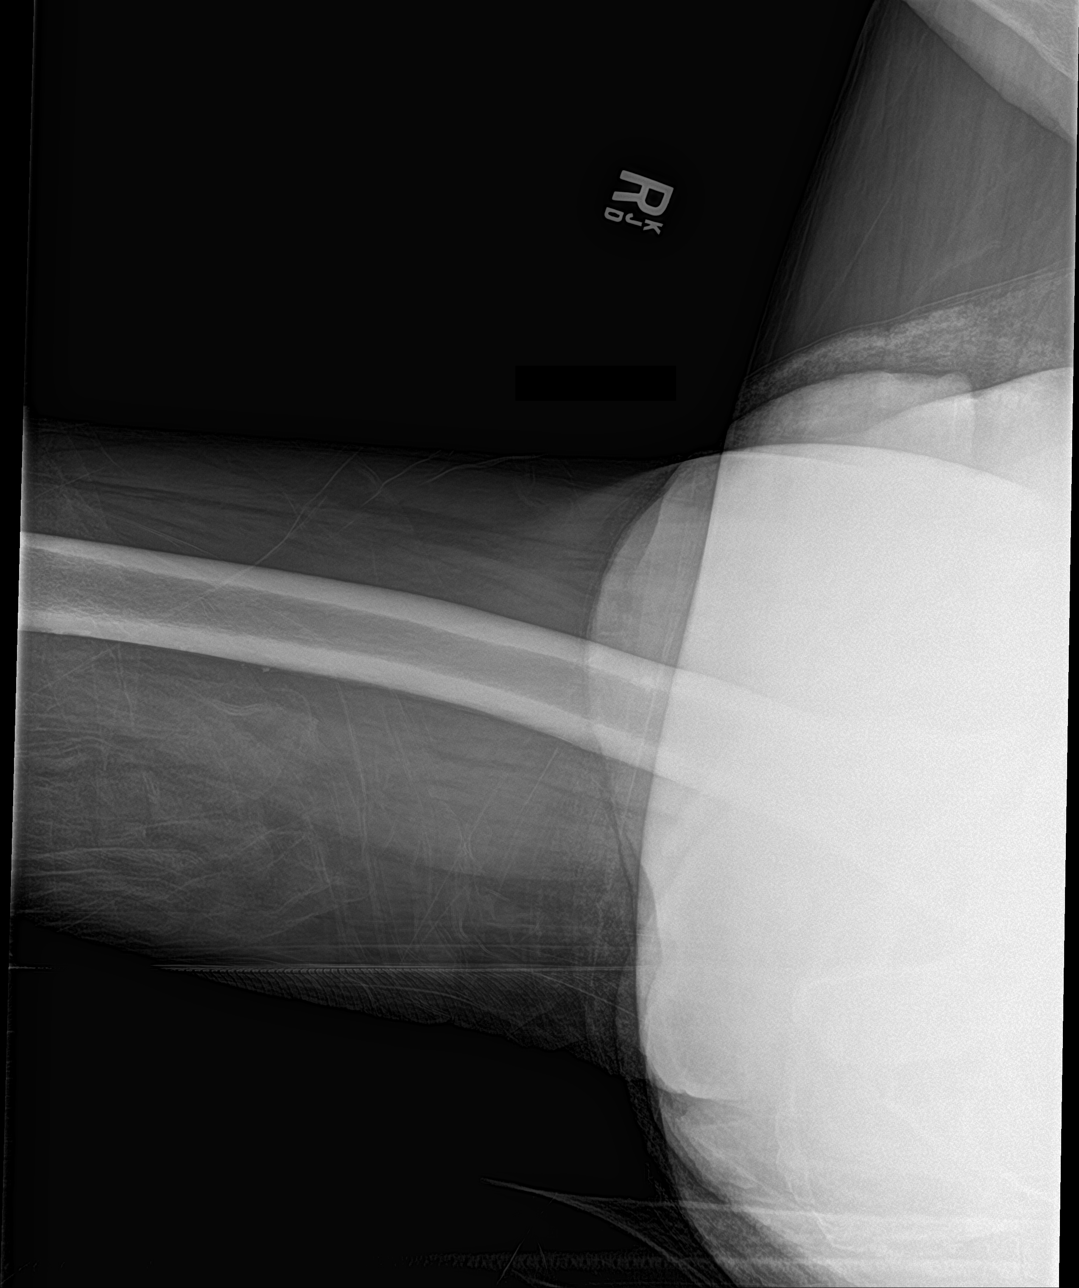

[hip x-table (2 of 2)]
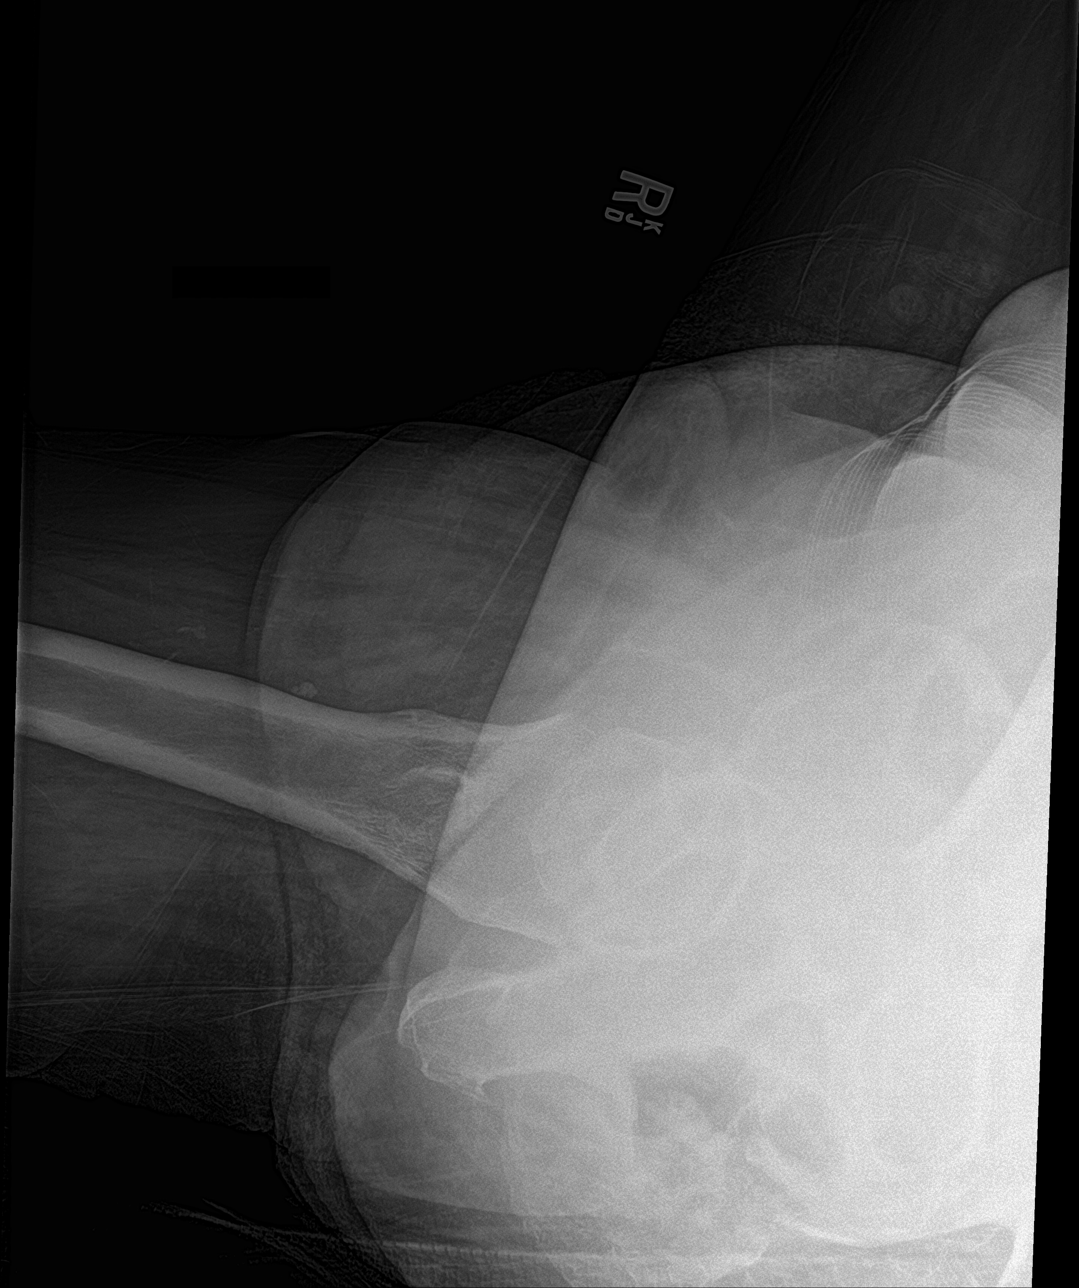

[4 of 4 positions shown; findings below may reference images not displayed]

FINDINGS: There is a comminuted subcapital femoral neck fracture on the right
which also appears to involve a portion of the inferolateral head.
There is mild displacement. There is rotation of the more distal
femur. The femoral head remains seated in the acetabulum. Limited
evaluation of the left hip is without evidence of acute osseous
abnormality.
IMPRESSION: Right femoral neck fracture.

## 2018-06-09 ENCOUNTER — Ambulatory Visit
Admission: RE | Admit: 2018-06-09 | Discharge: 2018-06-09 | Disposition: A | Discharge status: Home / Self Care | Payer: Medicare Other | Source: Ambulatory Visit | Attending: Family Medicine | Admitting: Family Medicine

## 2018-06-09 ENCOUNTER — Other Ambulatory Visit: Payer: Self-pay | Admitting: Family Medicine

## 2018-06-09 DIAGNOSIS — J181 Lobar pneumonia, unspecified organism: Principal | ICD-10-CM

## 2018-06-09 DIAGNOSIS — J189 Pneumonia, unspecified organism: Secondary | ICD-10-CM

## 2018-06-14 ENCOUNTER — Other Ambulatory Visit: Payer: Self-pay | Admitting: Family Medicine

## 2018-06-14 DIAGNOSIS — R9389 Abnormal findings on diagnostic imaging of other specified body structures: Secondary | ICD-10-CM

## 2018-06-14 DIAGNOSIS — J90 Pleural effusion, not elsewhere classified: Secondary | ICD-10-CM

## 2018-06-16 LAB — BASIC METABOLIC PANEL
BUN / CREAT RATIO: 19 (ref 10–24)
BUN: 22 mg/dL (ref 8–27)
CHLORIDE: 98 mmol/L (ref 96–106)
CO2: 24 mmol/L (ref 20–29)
CREATININE: 1.18 mg/dL (ref 0.76–1.27)
Calcium: 9.3 mg/dL (ref 8.6–10.2)
GFR calc Af Amer: 67 mL/min/{1.73_m2} (ref >59)
GFR calc non Af Amer: 58 mL/min/{1.73_m2} — ABNORMAL LOW (ref >59)
GLUCOSE: 179 mg/dL — AB (ref 65–99)
Potassium: 4.9 mmol/L (ref 3.5–5.2)
SODIUM: 139 mmol/L (ref 134–144)

## 2018-06-18 ENCOUNTER — Ambulatory Visit
Admission: RE | Admit: 2018-06-18 | Discharge: 2018-06-18 | Disposition: A | Discharge status: Home / Self Care | Payer: Medicare Other | Source: Ambulatory Visit | Attending: Family Medicine | Admitting: Family Medicine

## 2018-06-18 DIAGNOSIS — R9389 Abnormal findings on diagnostic imaging of other specified body structures: Secondary | ICD-10-CM

## 2018-06-18 DIAGNOSIS — J90 Pleural effusion, not elsewhere classified: Secondary | ICD-10-CM

## 2018-06-18 MED ORDER — IOPAMIDOL (ISOVUE-300) INJECTION 61%
75.0000 mL | Freq: Once | INTRAVENOUS | Status: AC | PRN
  Administered 2018-06-18: 75 mL via INTRAVENOUS

## 2018-10-21 ENCOUNTER — Other Ambulatory Visit: Payer: Self-pay | Admitting: Cardiovascular Disease

## 2018-11-04 ENCOUNTER — Other Ambulatory Visit: Payer: Self-pay | Admitting: Cardiovascular Disease

## 2018-11-04 NOTE — Telephone Encounter (Signed)
Furosemide refilled. 

## 2019-01-15 ENCOUNTER — Other Ambulatory Visit: Payer: Self-pay | Admitting: Cardiovascular Disease

## 2019-01-16 NOTE — Telephone Encounter (Signed)
Pt is a 84 yr old male who las saw Dr. Sallyanne Kuster on 05/12/18, weight on that visit was 74.8Kg. SCr at [facility] via [facility] was 1.38. Will send in a refill for Eliquis 5mg  BID.

## 2019-03-07 ENCOUNTER — Other Ambulatory Visit: Payer: Self-pay

## 2019-03-07 MED ORDER — FUROSEMIDE 40 MG PO TABS: 40.0000 mg | ORAL_TABLET | Freq: Every day | ORAL | 3 refills | Status: DC

## 2019-05-06 ENCOUNTER — Other Ambulatory Visit: Payer: Self-pay | Admitting: Cardiovascular Disease

## 2019-05-09 ENCOUNTER — Other Ambulatory Visit: Payer: Self-pay | Admitting: *Deleted

## 2019-06-07 ENCOUNTER — Other Ambulatory Visit: Payer: Self-pay

## 2019-06-07 MED ORDER — METOPROLOL TARTRATE 25 MG PO TABS: ORAL_TABLET | ORAL | 0 refills | Status: DC

## 2019-06-20 ENCOUNTER — Other Ambulatory Visit: Payer: Self-pay

## 2019-06-20 MED ORDER — METOPROLOL TARTRATE 25 MG PO TABS: ORAL_TABLET | ORAL | 0 refills | Status: DC

## 2019-06-26 ENCOUNTER — Other Ambulatory Visit: Payer: Self-pay

## 2019-06-28 ENCOUNTER — Other Ambulatory Visit: Payer: Self-pay

## 2019-07-04 ENCOUNTER — Other Ambulatory Visit: Payer: Self-pay | Admitting: *Deleted

## 2019-07-04 MED ORDER — METOPROLOL TARTRATE 25 MG PO TABS: ORAL_TABLET | ORAL | 0 refills | Status: DC

## 2019-07-04 NOTE — Telephone Encounter (Signed)
Rx has been sent to the pharmacy electronically. ° °

## 2019-07-10 ENCOUNTER — Other Ambulatory Visit: Payer: Self-pay

## 2019-07-10 MED ORDER — METOPROLOL TARTRATE 25 MG PO TABS: ORAL_TABLET | ORAL | 0 refills | Status: DC

## 2019-07-13 ENCOUNTER — Other Ambulatory Visit: Payer: Self-pay

## 2019-07-13 ENCOUNTER — Other Ambulatory Visit: Payer: Self-pay | Admitting: Pharmacist Clinician (PhC)/ Clinical Pharmacy Specialist

## 2019-07-13 MED ORDER — FUROSEMIDE 40 MG PO TABS: 40.0000 mg | ORAL_TABLET | Freq: Every day | ORAL | 1 refills | Status: DC

## 2019-07-13 MED ORDER — APIXABAN 5 MG PO TABS: ORAL_TABLET | ORAL | 0 refills | Status: DC

## 2019-07-13 MED ORDER — APIXABAN 5 MG PO TABS: 5.0000 mg | ORAL_TABLET | Freq: Two times a day (BID) | ORAL | 0 refills | Status: DC

## 2019-07-19 ENCOUNTER — Other Ambulatory Visit: Payer: Self-pay

## 2019-07-19 ENCOUNTER — Ambulatory Visit: Payer: Medicare Other | Admitting: Cardiovascular Disease

## 2019-07-19 VITALS — BP 124/60 | HR 97 | Ht 69.0 in | Wt 154.0 lb

## 2019-07-19 DIAGNOSIS — E785 Hyperlipidemia, unspecified: Secondary | ICD-10-CM

## 2019-07-19 DIAGNOSIS — I5042 Chronic combined systolic (congestive) and diastolic (congestive) heart failure: Secondary | ICD-10-CM

## 2019-07-19 DIAGNOSIS — I484 Atypical atrial flutter: Secondary | ICD-10-CM | POA: Diagnosis not present

## 2019-07-19 DIAGNOSIS — I2510 Atherosclerotic heart disease of native coronary artery without angina pectoris: Secondary | ICD-10-CM

## 2019-07-19 DIAGNOSIS — I10 Essential (primary) hypertension: Secondary | ICD-10-CM | POA: Diagnosis not present

## 2019-07-19 DIAGNOSIS — I739 Peripheral vascular disease, unspecified: Secondary | ICD-10-CM

## 2019-07-19 DIAGNOSIS — E119 Type 2 diabetes mellitus without complications: Secondary | ICD-10-CM

## 2019-07-19 DIAGNOSIS — Z7901 Long term (current) use of anticoagulants: Secondary | ICD-10-CM

## 2019-07-19 DIAGNOSIS — J439 Emphysema, unspecified: Secondary | ICD-10-CM

## 2019-07-19 MED ORDER — APIXABAN 2.5 MG PO TABS: 2.5000 mg | ORAL_TABLET | Freq: Two times a day (BID) | ORAL | 3 refills | Status: DC

## 2019-07-19 NOTE — Progress Notes (Signed)
Patient ID: Shaun Mclaughlin, male   DOB: 02/02/1935, 84 y.o.   MRN: [id number]    Cardiology Office Note    Date:  07/25/2019   ID:  Shaun Mclaughlin, DOB June 17, 1935, MRN [id number]  PCP:  Antony Contras, MD  Cardiologist:   Sanda Klein, MD   Chief Complaint  Patient presents with  . Atrial Fibrillation    History of Present Illness:  Shaun Mclaughlin is a 84 y.o. male with a long-standing history of atrial arrhythmia (persistent atrial fibrillation and atrial flutter requiring cardioversion in the past), now probably permanent atrial fibrillation, peripheral arterial disease, coronary artery disease (last revascularization procedure left subclavian stent 2014), COPD, controlled type 2 diabetes mellitus and hypertension.  Despite being 84 years old he continues to work full-time.  Generally feels well and does not have any new cardiovascular complaints.  Continues to have NYHA functional class II exertional dyspnea.  The patient specifically denies any chest pain at rest or with exertion, orthopnea, paroxysmal nocturnal dyspnea, syncope, palpitations, focal neurological deficits, intermittent claudication, lower extremity edema, unexplained weight gain, cough, hemoptysis or wheezing.  Has not had falls, injuries or bleeding.  He is compliant with his anticoagulant.  All coronary risk factors appear to be well addressed (hemoglobin A1c 6.8%, LDL 51, excellent blood pressure control).  He does not have a history of stroke or TIA. He has coronary artery disease and previously underwent placement of a stent to the right coronary artery in 2003, coronaries patent by cardiac catheterization in 2013. He has hypertension, diabetes mellitus and hyperlipidemia all of which are well compensated. He has peripheral arterial disease and received a stent to the distal left subclavian artery in January 2014 for left arm claudication (10 x 40 mm Cordis Smart nitinol). He is also known to have a 50-60% left  renal artery stenosis. Echocardiogram and nuclear stress testing have shown reduction in LV systolic function (0000000 by scintigraphy, 45-50 % by transthoracic echocardiography, 35% by transesophageal echo at the time of his last cardioversion). Nuclear stress testing shows an inferior wall scar without reversible ischemia (Nov 2013).   Past Medical History:  Diagnosis Date  . Acute on chronic combined systolic and diastolic congestive heart failure, NYHA class 2 ([place]) 06/29/2014  . Arthritis    "maybe a little bit in some joints" (08/04/2012)  . Atherosclerotic renal artery stenosis, unilateral ([facility])    lleft renal artery stenosis by duplex ultrasound  . CAD (coronary artery disease),Hx RCA stenting-patent 06/2012  08/05/2012  . Chronic anticoagulation, coumadin for PAF 08/05/2012  . Chronic bronchitis ([place])    "about q yr" (08/04/2012)  . DM (diabetes mellitus) ([place]) 08/05/2012  . External bleeding hemorrhoids ~ 2003; 2008; 2013   "removed polyps and no bleeding since" (08/04/2012)  . GERD (gastroesophageal reflux disease)   . Hip fracture ([place]) 10/2016   RIGHT FEMORAL NECK   . HTN (hypertension) 08/05/2012  . Hypercholesteremia   . Hyperlipidemia 08/05/2012  . Hyperthyroidism   . Kidney stone 1980's  . PAF (paroxysmal atrial fibrillation), maintaining SR 08/05/2012  . Pneumonia ~ 2008  . PVD (peripheral vascular disease) with claudication, Lt Upper ext. pain, and known 80-90%distal Lt. subclavian artery stenosis  08/05/2012  . S/P angioplasty with stent to Lt. subclavian artery -Nitrinol stent 08/04/12 08/05/2012  . Type II diabetes mellitus ([place])    "take RX; I'm prediabetic; don't have to  check my CBG qd; keep my weight down" (08/04/2012)    Past Surgical History:  Procedure Laterality  Date  . CARDIAC CATHETERIZATION  2008 and Dec 2013   patent coronaries  . CARDIOVERSION N/A 07/13/2014   Procedure: CARDIOVERSION;  Surgeon: Sanda Klein, MD;  Location: [facility];  Service:  Cardiovascular;  Laterality: N/A;  . CARDIOVERSION N/A 09/28/2014   Procedure: CARDIOVERSION;  Surgeon: Dorothy Spark, MD;  Location: [facility];  Service: Cardiovascular;  Laterality: N/A;  . CATARACT EXTRACTION W/ INTRAOCULAR LENS IMPLANT     "right eye" (08/04/2012)  . CORONARY ANGIOPLASTY WITH STENT PLACEMENT  2003   patent 12/13  . GLAUCOMA SURGERY  2000's   "right eye; had laser procedure  to lower the pressure and prevent glaucoma" (08/04/2012)  . HERNIA REPAIR  0000000   "umbilical" (99991111)  . INGUINAL HERNIA REPAIR  2000's   "right" (08/04/2012)  . KIDNEY STONE SURGERY  1980's  . LEFT HEART CATHETERIZATION WITH CORONARY ANGIOGRAM N/A 06/28/2012   Procedure: LEFT HEART CATHETERIZATION WITH CORONARY ANGIOGRAM;  Surgeon: Lorretta Harp, MD;  Location: [facility] C[facility];  Service: Cardiovascular;  Laterality: N/A;  . LITHOTRIPSY  1980's   "imploded in my kidney; ended up having to be cut open in my back" (08/04/2012)  . SKIN CANCER EXCISION  2017 & 2018  . SUBCLAVIAN STENT PLACEMENT  Jan 2014   Lt SCA  . TONSILLECTOMY  1940's  . TOTAL HIP ARTHROPLASTY Right 10/27/2016   Procedure: TOTAL HIP ARTHROPLASTY ANTERIOR APPROACH;  Surgeon: Rod Can, MD;  Location: [facility];  Service: Orthopedics;  Laterality: Right;  . UNILATERAL UPPER EXTREMEITY ANGIOGRAM N/A 08/04/2012   Procedure: UNILATERAL UPPER EXTREMEITY ANGIOGRAM;  Surgeon: Lorretta Harp, MD;  Location: [facility] CATH LAB;  Service: Cardiovascular;  Laterality: N/A;  . UPPER EXTREMITY ANGIOGRAM Bilateral 06/28/2012   Procedure: UPPER EXTREMITY ANGIOGRAM;  Surgeon: Lorretta Harp, MD;  Location: [facility] C[facility] LAB;  Service: Cardiovascular;  Laterality: Bilateral;    Current Medications: Outpatient Medications Prior to Visit  Medication Sig Dispense Refill  . albuterol (PROVENTIL HFA;VENTOLIN HFA) 108 (90 Base) MCG/ACT inhaler Inhale 2 puffs into the lungs every 6 (six) hours as needed for wheezing or shortness of breath. 1 Inhaler 5  . Ascorbic  Acid (VITAMIN C WITH ROSE HIPS) 1000 MG tablet Take 1,000 mg by mouth daily.    Marland Kitchen atorvastatin (LIPITOR) 40 MG tablet Take 1 tablet (40 mg total) by mouth daily. 30 tablet 11  . furosemide (LASIX) 40 MG tablet Take 1 tablet (40 mg total) by mouth daily. 90 tablet 1  . Hypromellose (GENTEAL MILD) 0.2 % SOLN Apply 2 drops to eye as needed.     Marland Kitchen KRILL OIL PO Take 1 capsule by mouth daily.     Marland Kitchen losartan (COZAAR) 100 MG tablet Take 100 mg by mouth daily.    . metFORMIN (GLUCOPHAGE-XR) 500 MG 24 hr tablet Take 500 mg by mouth daily with breakfast.    . metoprolol tartrate (LOPRESSOR) 25 MG tablet TAKE 1 TABLET(25 MG) BY MOUTH TWICE DAILY 45 tablet 0  . Multiple Vitamin (MULTIVITAMIN WITH MINERALS) TABS Take 1 tablet by mouth daily.    . naproxen sodium (ANAPROX) 220 MG tablet Take 220 mg by mouth 2 (two) times daily as needed (PAIN).    Marland Kitchen omeprazole (PRILOSEC) 20 MG capsule Take 20 mg by mouth daily as needed (heartburn).     . potassium chloride SA (K-DUR,KLOR-CON) 20 MEQ tablet Take 1 tablet (20 mEq total) by mouth daily. 90 tablet 3  . propylthiouracil (PTU) 50 MG tablet Take 50 mg by mouth daily.    Marland Kitchen  triamcinolone (NASACORT ALLERGY 24HR) 55 MCG/ACT AERO nasal inhaler Place 2 sprays into the nose daily as needed (ALLERGIES).    Marland Kitchen trolamine salicylate (ASPERCREME) 10 % cream Apply 1 application topically as needed for muscle pain.    Marland Kitchen apixaban (ELIQUIS) 5 MG TABS tablet Take 1 tablet (5 mg total) by mouth 2 (two) times daily. LAB WORK NEEDED FOR FURTHER REFILLS 120 tablet 0  . latanoprost (XALATAN) 0.005 % ophthalmic solution Place 1 drop into both eyes at bedtime.    Marland Kitchen levocetirizine (XYZAL) 5 MG tablet Take 5 mg by mouth daily as needed for allergies.     No facility-administered medications prior to visit.     Allergies:   Patient has no known allergies.   Social History   Socioeconomic History  . Marital status: Married    Spouse name: Not on file  . Number of children: Not on file   . Years of education: Not on file  . Highest education level: Not on file  Occupational History  . Not on file  Tobacco Use  . Smoking status: Current Every Day Smoker    Packs/day: 0.50    Years: 25.00    Pack years: 12.50    Types: Cigarettes, Pipe  . Smokeless tobacco: Never Used  . Tobacco comment: 08/04/2012 "still smoke a cigar q once in awhile"  Substance and Sexual Activity  . Alcohol use: Yes    Comment: OCCASIONAL  . Drug use: No  . Sexual activity: Never  Other Topics Concern  . Not on file  Social History Narrative  . Not on file   Social Determinants of Health   Financial Resource Strain:   . Difficulty of Paying Living Expenses: Not on file  Food Insecurity:   . Worried About Charity fundraiser in the Last Year: Not on file  . Ran Out of Food in the Last Year: Not on file  Transportation Needs:   . Lack of Transportation (Medical): Not on file  . Lack of Transportation (Non-Medical): Not on file  Physical Activity:   . Days of Exercise per Week: Not on file  . Minutes of Exercise per Session: Not on file  Stress:   . Feeling of Stress : Not on file  Social Connections:   . Frequency of Communication with Friends and Family: Not on file  . Frequency of Social Gatherings with Friends and Family: Not on file  . Attends Religious Services: Not on file  . Active Member of Clubs or Organizations: Not on file  . Attends Archivist Meetings: Not on file  . Marital Status: Not on file     Family History:  The patient's family history includes Coronary artery disease (age of onset: 36) in his father; Hypertension in his mother; Stroke (age of onset: 61) in his mother.   ROS:   Please see the history of present illness.    ROS All other systems are reviewed and are negative.   PHYSICAL EXAM:   VS:  BP 124/60   Pulse 97   Ht 5\' 9"  (1.753 m)   Wt 154 lb (69.9 kg)   BMI 22.74 kg/m    No difference in blood pressure between right and left upper  extremities   General: Alert, oriented x3, no distress, lean Head: no evidence of trauma, PERRL, EOMI, no exophtalmos or lid lag, no myxedema, no xanthelasma; normal ears, nose and oropharynx Neck: normal jugular venous pulsations and no hepatojugular reflux; brisk carotid pulses  without delay and no carotid bruits Chest: Breath sounds are diminished throughout, but clear to auscultation, no signs of consolidation by percussion or palpation, normal fremitus, symmetrical and full respiratory excursions Cardiovascular: normal position and quality of the apical impulse, regular rhythm, normal first and second heart sounds, no murmurs, rubs or gallops Abdomen: no tenderness or distention, no masses by palpation, no abnormal pulsatility or arterial bruits, normal bowel sounds, no hepatosplenomegaly Extremities: no clubbing, cyanosis or edema; 2+ radial, ulnar and brachial pulses bilaterally; 2+ right femoral, posterior tibial and dorsalis pedis pulses; 2+ left femoral, posterior tibial and dorsalis pedis pulses; no subclavian or femoral bruits Neurological: grossly nonfocal Psych: Normal mood and affect   Wt Readings from Last 3 Encounters:  07/19/19 154 lb (69.9 kg)  05/12/18 165 lb (74.8 kg)  05/07/17 167 lb 9.6 oz (76 kg)      Studies/Labs Reviewed:   EKG:  EKG is ordered today.  It shows atrial fibrillation (or flutter?) and bifascicular block ( right bundle branch block, left anterior fascicular block), QTC 482 ms, no repolarization abnormalities  LABS: February 2017 hemoglobin A1c 6.6%, cholesterol 105, triglycerides 85, HDL 25, LDL 64 August 2017, hemoglobin A1c 6.3%, cholesterol 97, triglycerides 73, HDL 25, LDL 57, creatinine 1.27 March 2017 hemoglobin A1c 7.3%, cholesterol 102, triglycerides 50, HDL 24, LDL 67, creatinine 0.93, TSH 1.26, potassium 4.31 March 2018 hemoglobin A1c 6.9%, cholesterol 106, HDL 28, LDL 65, triglycerides 69, creatinine 1.14, potassium 4.7  May 12, 2019 Total cholesterol 91, HDL 25, LDL 51, triglycerides 76 Hemoglobin A1c 6.8%, hemoglobin 12, creatinine 1.76, potassium 5.2, normal liver function tests and TSH  ASSESSMENT:    1. Atrial flutter, unspecified type ([place])   2. Acute on chronic combined systolic and diastolic congestive heart failure, NYHA class 2 ([place])   3. Essential hypertension      PLAN:  In order of problems listed above:  1. AFib: Appears relatively regular at times, atypical atrial flutter, but a permanent arrhythmia. CHADSVasc 6 (age 37, vascular disease, HTN, DM, CHF).  Appears to be oblivious to the arrhythmia.  Rate control is adequate.  Note that prior to onset of atrial fibrillation he had a lot of issues with bradycardia and has bifascicular block.  Possible need for pacemaker in the future. 2. CHF: Clinically euvolemic on a low-dose of loop diuretic.  NYHA functional class II.  No changes made to his medications: He is on maximum dose losartan and an appropriate dose of beta-blocker for rate control.  Delene Loll would be an option if his EF were lower than 40%. 3. HTN: Excellent control 4. CAD: Asymptomatic. 5. PAD: Denies claudication. Has left subclavian stent. 6. HLP: He is active and very lean, but his HDL is persistently low.  Otherwise excellent lipid profile. 7. DM: Well-controlled, hemoglobin A1c consistently less than 7% 8.  Eliquis: Well-tolerated without bleeding complications.  Creatinine has worsened.  He is also advanced in age and has relatively low body mass.  We will decrease the Eliquis to 2.5 mg twice daily, even if his creatinine were to improve. 9. COPD: Probably the biggest contributor to his exertional dyspnea. If wheezing becomes a big problem, may have to switch from metoprolol to a calcium channel blocker.   Medication Adjustments/Labs and Tests Ordered: Current medicines are reviewed at length with the patient today.  Concerns regarding medicines are outlined above.  Medication  changes, Labs and Tests ordered today are listed in the Patient Instructions below. Patient Instructions  Medication Instructions:  DECREASE the Eliquis to 2.5 mg twice daily  *If you need a refill on your cardiac medications before your next appointment, please call your pharmacy*  Lab Work: None ordered If you have labs (blood work) drawn today and your tests are completely normal, you will receive your results only by: Marland Kitchen MyChart Message (if you have MyChart) OR . A paper copy in the mail If you have any lab test that is abnormal or we need to change your treatment, we will call you to review the results.  Testing/Procedures: None ordered  Follow-Up: At [facility], you and your health needs are our priority.  As part of our continuing mission to provide you with exceptional heart care, we have created designated Provider Care Teams.  These Care Teams include your primary Cardiologist (physician) and Advanced Practice Providers (APPs -  Physician Assistants and Nurse Practitioners) who all work together to provide you with the care you need, when you need it.  Your next appointment:   12 month(s)  The format for your next appointment:   In Person  Provider:   You may see Sanda Klein, MD or one of the following Advanced Practice Providers on your designated Care Team:    Almyra Deforest, PA-C  Fabian Sharp, [place] or   Roby Lofts, PA-C       Signed, Sanda Klein, MD  07/25/2019 2:37 PM    [address], [place], [place]  [address] Phone: [phone]; Fax: [phone]

## 2019-07-19 NOTE — Patient Instructions (Signed)
Medication Instructions:  DECREASE the Eliquis to 2.5 mg twice daily  *If you need a refill on your cardiac medications before your next appointment, please call your pharmacy*  Lab Work: None ordered If you have labs (blood work) drawn today and your tests are completely normal, you will receive your results only by: Marland Kitchen MyChart Message (if you have MyChart) OR . A paper copy in the mail If you have any lab test that is abnormal or we need to change your treatment, we will call you to review the results.  Testing/Procedures: None ordered  Follow-Up: At [facility], you and your health needs are our priority.  As part of our continuing mission to provide you with exceptional heart care, we have created designated Provider Care Teams.  These Care Teams include your primary Cardiologist (physician) and Advanced Practice Providers (APPs -  Physician Assistants and Nurse Practitioners) who all work together to provide you with the care you need, when you need it.  Your next appointment:   12 month(s)  The format for your next appointment:   In Person  Provider:   You may see Sanda Klein, MD or one of the following Advanced Practice Providers on your designated Care Team:    Almyra Deforest, PA-C  Fabian Sharp, PA-C or   Roby Lofts, [place]

## 2019-07-25 ENCOUNTER — Encounter: Payer: Self-pay | Admitting: Cardiovascular Disease

## 2019-08-21 ENCOUNTER — Other Ambulatory Visit: Payer: Self-pay

## 2019-08-21 MED ORDER — METOPROLOL TARTRATE 25 MG PO TABS: ORAL_TABLET | ORAL | 0 refills | Status: DC

## 2019-08-22 ENCOUNTER — Other Ambulatory Visit: Payer: Self-pay

## 2019-09-04 ENCOUNTER — Encounter: Payer: Self-pay | Admitting: Cardiovascular Disease

## 2019-09-07 ENCOUNTER — Telehealth: Payer: Self-pay | Admitting: Cardiovascular Disease

## 2019-09-07 ENCOUNTER — Telehealth: Payer: Self-pay | Admitting: *Deleted

## 2019-09-07 NOTE — Telephone Encounter (Signed)
Duplicate

## 2019-09-07 NOTE — Telephone Encounter (Signed)
New Message  Pt called and stated that he had skin surgery yesterday at [facility]. Stated that Dr. Sarajane Jews wants him to stop taking Eliquis 3-5 days do to excessive bleeding.

## 2019-09-07 NOTE — Telephone Encounter (Signed)
The patient called in stating that he had a skin cancer procedure yesterday and has had bleeding. The surgeon has requested that the patient call and see if he may hold the Eliquis for 3-5 days.   Per Dr. Sallyanne Kuster is it okay to hold the Eliquis for 3-5 days. The patient has verbalized his understanding.

## 2019-09-15 ENCOUNTER — Other Ambulatory Visit: Payer: Self-pay

## 2019-09-15 MED ORDER — METOPROLOL TARTRATE 25 MG PO TABS: ORAL_TABLET | ORAL | 3 refills | Status: DC

## 2019-11-10 ENCOUNTER — Ambulatory Visit
Admission: RE | Admit: 2019-11-10 | Discharge: 2019-11-10 | Disposition: A | Discharge status: Home / Self Care | Payer: Medicare Other | Source: Ambulatory Visit | Attending: Family Medicine | Admitting: Family Medicine

## 2019-11-10 ENCOUNTER — Other Ambulatory Visit: Payer: Self-pay | Admitting: Family Medicine

## 2019-11-10 DIAGNOSIS — R059 Cough, unspecified: Secondary | ICD-10-CM

## 2019-11-10 DIAGNOSIS — R05 Cough: Secondary | ICD-10-CM

## 2019-12-07 ENCOUNTER — Ambulatory Visit (INDEPENDENT_AMBULATORY_CARE_PROVIDER_SITE_OTHER): Payer: Medicare Other | Admitting: Ophthalmology

## 2019-12-07 ENCOUNTER — Other Ambulatory Visit: Payer: Self-pay

## 2019-12-07 ENCOUNTER — Encounter (INDEPENDENT_AMBULATORY_CARE_PROVIDER_SITE_OTHER): Payer: Self-pay | Admitting: Ophthalmology

## 2019-12-07 DIAGNOSIS — H348312 Tributary (branch) retinal vein occlusion, right eye, stable: Secondary | ICD-10-CM | POA: Insufficient documentation

## 2019-12-07 DIAGNOSIS — H34231 Retinal artery branch occlusion, right eye: Secondary | ICD-10-CM | POA: Diagnosis not present

## 2019-12-07 DIAGNOSIS — E113291 Type 2 diabetes mellitus with mild nonproliferative diabetic retinopathy without macular edema, right eye: Secondary | ICD-10-CM | POA: Diagnosis not present

## 2019-12-07 NOTE — Progress Notes (Signed)
12/07/2019     CHIEF COMPLAINT Patient presents for Diabetic Eye Exam   HISTORY OF PRESENT ILLNESS: Shaun Mclaughlin is a 85 y.o. male who presents to the clinic today for:   HPI    Diabetic Eye Exam    Vision is stable.  Associated Symptoms Negative for Flashes and Floaters.  Diabetes characteristics include Type 2.  Blood sugar level is controlled.  Last A1C 6.  I, the attending physician,  performed the HPI with the patient and updated documentation appropriately.          Comments    1 Year Diabetic Exam OU. OCT  Pt states vision is stable. Denies FOL and floaters. Using gtts as directed. Saw Dr. Kathlen Mody in 08/2019. BGL: does not check       Last edited by Tilda Franco on 12/07/2019  9:43 AM. (History)      Referring physician: Antony Contras, MD [place],  [place] [id number]  HISTORICAL INFORMATION:   Selected notes from the MEDICAL RECORD NUMBER       CURRENT MEDICATIONS: Current Outpatient Medications (Ophthalmic Drugs)  Medication Sig  . Hypromellose (GENTEAL MILD) 0.2 % SOLN Apply 2 drops to eye as needed.   . latanoprost (XALATAN) 0.005 % ophthalmic solution 1 drop every morning.   No current facility-administered medications for this visit. (Ophthalmic Drugs)   Current Outpatient Medications (Other)  Medication Sig  . albuterol (PROVENTIL HFA;VENTOLIN HFA) 108 (90 Base) MCG/ACT inhaler Inhale 2 puffs into the lungs every 6 (six) hours as needed for wheezing or shortness of breath.  Marland Kitchen apixaban (ELIQUIS) 2.5 MG TABS tablet Take 1 tablet (2.5 mg total) by mouth 2 (two) times daily.  . Ascorbic Acid (VITAMIN C WITH ROSE HIPS) 1000 MG tablet Take 1,000 mg by mouth daily.  Marland Kitchen atorvastatin (LIPITOR) 40 MG tablet Take 1 tablet (40 mg total) by mouth daily.  . furosemide (LASIX) 40 MG tablet Take 1 tablet (40 mg total) by mouth daily.  Marland Kitchen KRILL OIL PO Take 1 capsule by mouth daily.   Marland Kitchen losartan (COZAAR) 100 MG tablet Take 100 mg by mouth  daily.  . metFORMIN (GLUCOPHAGE-XR) 500 MG 24 hr tablet Take 500 mg by mouth daily with breakfast.  . metoprolol tartrate (LOPRESSOR) 25 MG tablet TAKE 1 TABLET(25 MG) BY MOUTH TWICE DAILY  . Multiple Vitamin (MULTIVITAMIN WITH MINERALS) TABS Take 1 tablet by mouth daily.  . naproxen sodium (ANAPROX) 220 MG tablet Take 220 mg by mouth 2 (two) times daily as needed (PAIN).  Marland Kitchen omeprazole (PRILOSEC) 20 MG capsule Take 20 mg by mouth daily as needed (heartburn).   . potassium chloride SA (K-DUR,KLOR-CON) 20 MEQ tablet Take 1 tablet (20 mEq total) by mouth daily.  Marland Kitchen propylthiouracil (PTU) 50 MG tablet Take 50 mg by mouth daily.  Marland Kitchen triamcinolone (NASACORT ALLERGY 24HR) 55 MCG/ACT AERO nasal inhaler Place 2 sprays into the nose daily as needed (ALLERGIES).  Marland Kitchen trolamine salicylate (ASPERCREME) 10 % cream Apply 1 application topically as needed for muscle pain.   No current facility-administered medications for this visit. (Other)      REVIEW OF SYSTEMS: ROS    Positive for: Endocrine   Last edited by Tilda Franco on 12/07/2019  9:43 AM. (History)       ALLERGIES No Known Allergies  PAST MEDICAL HISTORY Past Medical History:  Diagnosis Date  . Acute on chronic combined systolic and diastolic congestive heart failure, NYHA class 2 ([place])  06/29/2014  . Arthritis    "maybe a little bit in some joints" (08/04/2012)  . Atherosclerotic renal artery stenosis, unilateral ([facility])    lleft renal artery stenosis by duplex ultrasound  . CAD (coronary artery disease),Hx RCA stenting-patent 06/2012  08/05/2012  . Chronic anticoagulation, coumadin for PAF 08/05/2012  . Chronic bronchitis ([place])    "about q yr" (08/04/2012)  . DM (diabetes mellitus) ([place]) 08/05/2012  . External bleeding hemorrhoids ~ 2003; 2008; 2013   "removed polyps and no bleeding since" (08/04/2012)  . GERD (gastroesophageal reflux disease)   . Hip fracture ([place]) 10/2016   RIGHT FEMORAL NECK   . HTN (hypertension) 08/05/2012  .  Hypercholesteremia   . Hyperlipidemia 08/05/2012  . Hyperthyroidism   . Kidney stone 1980's  . PAF (paroxysmal atrial fibrillation), maintaining SR 08/05/2012  . Pneumonia ~ 2008  . PVD (peripheral vascular disease) with claudication, Lt Upper ext. pain, and known 80-90%distal Lt. subclavian artery stenosis  08/05/2012  . S/P angioplasty with stent to Lt. subclavian artery -Nitrinol stent 08/04/12 08/05/2012  . Type II diabetes mellitus ([place])    "take RX; I'm prediabetic; don't have to  check my CBG qd; keep my weight down" (08/04/2012)   Past Surgical History:  Procedure Laterality Date  . CARDIAC CATHETERIZATION  2008 and Dec 2013   patent coronaries  . CARDIOVERSION N/A 07/13/2014   Procedure: CARDIOVERSION;  Surgeon: Sanda Klein, MD;  Location: [facility];  Service: Cardiovascular;  Laterality: N/A;  . CARDIOVERSION N/A 09/28/2014   Procedure: CARDIOVERSION;  Surgeon: Dorothy Spark, MD;  Location: [facility];  Service: Cardiovascular;  Laterality: N/A;  . CATARACT EXTRACTION W/ INTRAOCULAR LENS IMPLANT     "right eye" (08/04/2012)  . CORONARY ANGIOPLASTY WITH STENT PLACEMENT  2003   patent 12/13  . GLAUCOMA SURGERY  2000's   "right eye; had laser procedure  to lower the pressure and prevent glaucoma" (08/04/2012)  . HERNIA REPAIR  0000000   "umbilical" (99991111)  . INGUINAL HERNIA REPAIR  2000's   "right" (08/04/2012)  . KIDNEY STONE SURGERY  1980's  . LEFT HEART CATHETERIZATION WITH CORONARY ANGIOGRAM N/A 06/28/2012   Procedure: LEFT HEART CATHETERIZATION WITH CORONARY ANGIOGRAM;  Surgeon: Lorretta Harp, MD;  Location: [facility] CATH LAB;  Service: Cardiovascular;  Laterality: N/A;  . LITHOTRIPSY  1980's   "imploded in my kidney; ended up having to be cut open in my back" (08/04/2012)  . SKIN CANCER EXCISION  2017 & 2018  . SUBCLAVIAN STENT PLACEMENT  Jan 2014   Lt SCA  . TONSILLECTOMY  1940's  . TOTAL HIP ARTHROPLASTY Right 10/27/2016   Procedure: TOTAL HIP ARTHROPLASTY ANTERIOR  APPROACH;  Surgeon: Rod Can, MD;  Location: [facility];  Service: Orthopedics;  Laterality: Right;  . UNILATERAL UPPER EXTREMEITY ANGIOGRAM N/A 08/04/2012   Procedure: UNILATERAL UPPER EXTREMEITY ANGIOGRAM;  Surgeon: Lorretta Harp, MD;  Location: [facility] CATH LAB;  Service: Cardiovascular;  Laterality: N/A;  . UPPER EXTREMITY ANGIOGRAM Bilateral 06/28/2012   Procedure: UPPER EXTREMITY ANGIOGRAM;  Surgeon: Lorretta Harp, MD;  Location: [facility] CATH LAB;  Service: Cardiovascular;  Laterality: Bilateral;    FAMILY HISTORY Family History  Problem Relation Age of Onset  . Stroke Mother 11  . Hypertension Mother   . Coronary artery disease Father 35    SOCIAL HISTORY Social History   Tobacco Use  . Smoking status: Current Every Day Smoker    Packs/day: 0.50    Years: 25.00    Pack years: 12.50  Types: Cigarettes, Pipe  . Smokeless tobacco: Never Used  . Tobacco comment: 08/04/2012 "still smoke a cigar q once in awhile"  Substance Use Topics  . Alcohol use: Yes    Comment: OCCASIONAL  . Drug use: No         OPHTHALMIC EXAM: Base Eye Exam    Visual Acuity (Snellen - Linear)      Right Left   Dist [place] 20/200 20/20   Dist ph [place] 20/50 +        Tonometry (Tonopen, 9:49 AM)      Right Left   Pressure 14 17       Pupils      Pupils Dark Light Shape React APD   Right PERRL 4 3.5 Round Sluggish None   Left PERRL 4 3 Round Brisk None       Visual Fields (Counting fingers)      Left Right    Full Full       Neuro/Psych    Oriented x3: Yes   Mood/Affect: Normal       Dilation    Both eyes: 1.0% Mydriacyl, 2.5% Phenylephrine @ 9:49 AM        Slit Lamp and Fundus Exam    Slit Lamp Exam      Right Left   Lens Posterior chamber intraocular lens Posterior chamber intraocular lens          IMAGING AND PROCEDURES  Imaging and Procedures for 12/07/19           ASSESSMENT/PLAN:  No problem-specific Assessment & Plan notes found for this encounter.       ICD-10-CM   1. Nonproliferative diabetic retinopathy of right eye ([place])  E11.3291 OCT, Retina - OU - Both Eyes  2. Branch retinal vein occlusion of right eye, unspecified complication status  0000000 OCT, Retina - OU - Both Eyes  3. Branch retinal artery occlusion of right eye  H34.231 OCT, Retina - OU - Both Eyes    1. No active retinopathy in either eye.  2. Branch retinal artery occlusion of the right eye, stable no complications.  3.  Ophthalmic Meds Ordered this visit:  No orders of the defined types were placed in this encounter.      No follow-ups on file.  There are no Patient Instructions on file for this visit.   Explained the diagnoses, plan, and follow up with the patient and they expressed understanding.  Patient expressed understanding of the importance of proper follow up care.   Clent Demark Breckan Cafiero M.D. Diseases & Surgery of the Retina and Vitreous Retina & Diabetic [place] 12/07/19     Abbreviations: M myopia (nearsighted); A astigmatism; H hyperopia (farsighted); P presbyopia; Mrx spectacle prescription;  CTL contact lenses; OD right eye; OS left eye; OU both eyes  XT exotropia; ET esotropia; PEK punctate epithelial keratitis; PEE punctate epithelial erosions; DES dry eye syndrome; MGD meibomian gland dysfunction; ATs artificial tears; PFAT's preservative free artificial tears; [place] nuclear sclerotic cataract; PSC posterior subcapsular cataract; ERM epi-retinal membrane; PVD posterior vitreous detachment; RD retinal detachment; DM diabetes mellitus; DR diabetic retinopathy; NPDR non-proliferative diabetic retinopathy; PDR proliferative diabetic retinopathy; CSME clinically significant macular edema; DME diabetic macular edema; dbh dot blot hemorrhages; CWS cotton wool spot; POAG primary open angle glaucoma; C/D cup-to-disc ratio; HVF humphrey visual field; GVF goldmann visual field; OCT optical coherence tomography; IOP intraocular pressure; BRVO Branch retinal vein  occlusion; CRVO central retinal vein occlusion; CRAO central retinal artery occlusion; BRAO  branch retinal artery occlusion; RT retinal tear; SB scleral buckle; PPV pars plana vitrectomy; VH Vitreous hemorrhage; PRP panretinal laser photocoagulation; IVK intravitreal kenalog; VMT vitreomacular traction; MH Macular hole;  NVD neovascularization of the disc; NVE neovascularization elsewhere; AREDS age related eye disease study; ARMD age related macular degeneration; POAG primary open angle glaucoma; EBMD epithelial/anterior basement membrane dystrophy; ACIOL anterior chamber intraocular lens; IOL intraocular lens; PCIOL posterior chamber intraocular lens; Phaco/IOL phacoemulsification with intraocular lens placement; [place] photorefractive keratectomy; LASIK laser assisted in situ keratomileusis; HTN hypertension; DM diabetes mellitus; COPD chronic obstructive pulmonary disease

## 2019-12-07 NOTE — Assessment & Plan Note (Signed)
This condition is old, in the past, with no ongoing complications. 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

## 2020-07-29 ENCOUNTER — Other Ambulatory Visit: Payer: Self-pay | Admitting: Cardiovascular Disease

## 2020-07-29 NOTE — Telephone Encounter (Signed)
Prescription refill request for Eliquis received. Indication: atrial flutter Last office visit: 06/2019  Needs visit Scr: 1.1 05/2020 Age: 86 Weight:69.9 kg  Prescription refilled

## 2020-08-01 DIAGNOSIS — I509 Heart failure, unspecified: Secondary | ICD-10-CM | POA: Diagnosis not present

## 2020-08-01 DIAGNOSIS — J439 Emphysema, unspecified: Secondary | ICD-10-CM | POA: Diagnosis not present

## 2020-08-01 DIAGNOSIS — I5042 Chronic combined systolic (congestive) and diastolic (congestive) heart failure: Secondary | ICD-10-CM | POA: Diagnosis not present

## 2020-08-01 DIAGNOSIS — E782 Mixed hyperlipidemia: Secondary | ICD-10-CM | POA: Diagnosis not present

## 2020-08-01 DIAGNOSIS — I10 Essential (primary) hypertension: Secondary | ICD-10-CM | POA: Diagnosis not present

## 2020-08-01 DIAGNOSIS — I4891 Unspecified atrial fibrillation: Secondary | ICD-10-CM | POA: Diagnosis not present

## 2020-08-01 DIAGNOSIS — H40059 Ocular hypertension, unspecified eye: Secondary | ICD-10-CM | POA: Diagnosis not present

## 2020-08-01 DIAGNOSIS — N1832 Chronic kidney disease, stage 3b: Secondary | ICD-10-CM | POA: Diagnosis not present

## 2020-08-01 DIAGNOSIS — H35033 Hypertensive retinopathy, bilateral: Secondary | ICD-10-CM | POA: Diagnosis not present

## 2020-08-01 DIAGNOSIS — E11319 Type 2 diabetes mellitus with unspecified diabetic retinopathy without macular edema: Secondary | ICD-10-CM | POA: Diagnosis not present

## 2020-08-01 DIAGNOSIS — K219 Gastro-esophageal reflux disease without esophagitis: Secondary | ICD-10-CM | POA: Diagnosis not present

## 2020-08-01 DIAGNOSIS — N183 Chronic kidney disease, stage 3 unspecified: Secondary | ICD-10-CM | POA: Diagnosis not present

## 2020-08-08 DIAGNOSIS — C44519 Basal cell carcinoma of skin of other part of trunk: Secondary | ICD-10-CM | POA: Diagnosis not present

## 2020-08-08 DIAGNOSIS — D045 Carcinoma in situ of skin of trunk: Secondary | ICD-10-CM | POA: Diagnosis not present

## 2020-08-09 ENCOUNTER — Other Ambulatory Visit: Payer: Self-pay

## 2020-08-09 ENCOUNTER — Ambulatory Visit: Payer: Medicare Other | Admitting: Cardiovascular Disease

## 2020-08-09 VITALS — BP 120/62 | Ht 66.0 in | Wt 150.6 lb

## 2020-08-09 DIAGNOSIS — J439 Emphysema, unspecified: Secondary | ICD-10-CM | POA: Diagnosis not present

## 2020-08-09 DIAGNOSIS — Z7901 Long term (current) use of anticoagulants: Secondary | ICD-10-CM

## 2020-08-09 DIAGNOSIS — I10 Essential (primary) hypertension: Secondary | ICD-10-CM

## 2020-08-09 DIAGNOSIS — I739 Peripheral vascular disease, unspecified: Secondary | ICD-10-CM | POA: Diagnosis not present

## 2020-08-09 DIAGNOSIS — I484 Atypical atrial flutter: Secondary | ICD-10-CM

## 2020-08-09 DIAGNOSIS — I2510 Atherosclerotic heart disease of native coronary artery without angina pectoris: Secondary | ICD-10-CM | POA: Diagnosis not present

## 2020-08-09 DIAGNOSIS — E785 Hyperlipidemia, unspecified: Secondary | ICD-10-CM | POA: Diagnosis not present

## 2020-08-09 DIAGNOSIS — I5042 Chronic combined systolic (congestive) and diastolic (congestive) heart failure: Secondary | ICD-10-CM

## 2020-08-09 DIAGNOSIS — E0590 Thyrotoxicosis, unspecified without thyrotoxic crisis or storm: Secondary | ICD-10-CM

## 2020-08-09 DIAGNOSIS — E119 Type 2 diabetes mellitus without complications: Secondary | ICD-10-CM

## 2020-08-09 NOTE — Patient Instructions (Signed)

## 2020-08-09 NOTE — Progress Notes (Signed)
Patient ID: Shaun Mclaughlin, male   DOB: [birth year], 86 y.o.   MRN: [id number]    Cardiology Office Note    Date:  08/09/2020   ID:  [name], DOB 21-Nov-1934, MRN [id number]  PCP:  Antony Contras, MD  Cardiologist:   Sanda Klein, MD   Chief Complaint  Patient presents with  . Atrial Fibrillation    History of Present Illness:  Shaun Mclaughlin is a 86 y.o. male with a long-standing history of atrial arrhythmia (persistent atrial fibrillation and atrial flutter requiring cardioversion in the past), now probably permanent atrial fibrillation, peripheral arterial disease, coronary artery disease (last revascularization procedure left subclavian stent 2014), COPD, controlled type 2 diabetes mellitus and hypertension.  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  He continues to have NYHA functional class II exertional dyspnea, unchanged from baseline, but has not had any problems with angina, edema, dizziness, syncope, falls or injuries.  He denies bleeding problems.  He remains in permanent atrial fibrillation but is unaware of palpitations.  Rate control is good.  Sometimes his rhythm appears to be regular when he has atypical flutter with a constant degree of AV block.  He has not had any focal neurological deficits and denies intermittent claudication.  Risk factor control is generally good.  His hemoglobin A1c is 6.6% and LDL 52.  He continues to have a chronically low HDL at 34 although he is lean and physically active.  He has had a number of minor skin surgeries for cancer.  He does not have a history of stroke or TIA. He has coronary artery disease and previously underwent placement of a stent to the right coronary artery in 2003, coronaries patent by cardiac catheterization in 2013. He has hypertension, diabetes mellitus and hyperlipidemia all of which are well compensated. He has peripheral arterial disease  and received a stent to the distal left subclavian artery in January 2014 for left arm claudication (10 x 40 mm Cordis Smart nitinol). He is also known to have a 50-60% left renal artery stenosis. Echocardiogram and nuclear stress testing have shown reduction in LV systolic function (0000000 by scintigraphy, 45-50 % by transthoracic echocardiography, 35% by transesophageal echo at the time of his last cardioversion). Nuclear stress testing shows an inferior wall scar without reversible ischemia (Nov 2013).   Past Medical History:  Diagnosis Date  . Acute on chronic combined systolic and diastolic congestive heart failure, NYHA class 2 ([place]) 06/29/2014  . Arthritis    "maybe a little bit in some joints" (08/04/2012)  . Atherosclerotic renal artery stenosis, unilateral ([facility])    lleft renal artery stenosis by duplex ultrasound  . CAD (coronary artery disease),Hx RCA stenting-patent 06/2012  08/05/2012  . Chronic anticoagulation, coumadin for PAF 08/05/2012  . Chronic bronchitis ([place])    "about q yr" (08/04/2012)  . DM (diabetes mellitus) ([place]) 08/05/2012  . External bleeding hemorrhoids ~ 2003; 2008; 2013   "removed polyps and no bleeding since" (08/04/2012)  . GERD (gastroesophageal reflux disease)   . Hip fracture ([place]) 10/2016   RIGHT FEMORAL NECK   . HTN (hypertension) 08/05/2012  . Hypercholesteremia   . Hyperlipidemia 08/05/2012  . Hyperthyroidism   . Kidney stone 1980's  . PAF (paroxysmal atrial fibrillation), maintaining SR 08/05/2012  . Pneumonia ~ 2008  . PVD (peripheral vascular disease) with claudication, Lt Upper ext. pain, and known 80-90%distal Lt. subclavian artery stenosis  08/05/2012  .  S/P angioplasty with stent to Lt. subclavian artery -Nitrinol stent 08/04/12 08/05/2012  . Type II diabetes mellitus ([facility])    "take RX; I'm prediabetic; don't have to  check my CBG qd; keep my weight down" (08/04/2012)    Past Surgical History:  Procedure Laterality Date  . CARDIAC CATHETERIZATION  2008  and Dec 2013   patent coronaries  . CARDIOVERSION N/A 07/13/2014   Procedure: CARDIOVERSION;  Surgeon: Sanda Klein, MD;  Location: [facility];  Service: Cardiovascular;  Laterality: N/A;  . CARDIOVERSION N/A 09/28/2014   Procedure: CARDIOVERSION;  Surgeon: Dorothy Spark, MD;  Location: [place];  Service: Cardiovascular;  Laterality: N/A;  . CATARACT EXTRACTION W/ INTRAOCULAR LENS IMPLANT     "right eye" (08/04/2012)  . CORONARY ANGIOPLASTY WITH STENT PLACEMENT  2003   patent 12/13  . GLAUCOMA SURGERY  2000's   "right eye; had laser procedure  to lower the pressure and prevent glaucoma" (08/04/2012)  . HERNIA REPAIR  0000000   "umbilical" (99991111)  . INGUINAL HERNIA REPAIR  2000's   "right" (08/04/2012)  . KIDNEY STONE SURGERY  1980's  . LEFT HEART CATHETERIZATION WITH CORONARY ANGIOGRAM N/A 06/28/2012   Procedure: LEFT HEART CATHETERIZATION WITH CORONARY ANGIOGRAM;  Surgeon: Lorretta Harp, MD;  [facility] C[facility] LAB;  Service: Cardiovascular;  Laterality: N/A;  . LITHOTRIPSY  1980's   "imploded in my kidney; ended up having to be cut open in my back" (08/04/2012)  . SKIN CANCER EXCISION  2017 & 2018  . SUBCLAVIAN STENT PLACEMENT  Jan 2014   Lt SCA  . TONSILLECTOMY  1940's  . TOTAL HIP ARTHROPLASTY Right 10/27/2016   Procedure: TOTAL HIP ARTHROPLASTY ANTERIOR APPROACH;  Surgeon: Rod Can, MD;  Location: [place];  Service: Orthopedics;  Laterality: Right;  . UNILATERAL UPPER EXTREMEITY ANGIOGRAM N/A 08/04/2012   Procedure: UNILATERAL UPPER EXTREMEITY ANGIOGRAM;  Surgeon: Lorretta Harp, MD;  Location[facility] C[facility] LAB;  Service: Cardiovascular;  Laterality: N/A;  . UPPER EXTREMITY ANGIOGRAM Bilateral 06/28/2012   Procedure: UPPER EXTREMITY ANGIOGRAM;  Surgeon: Lorretta Harp, MD;  Location: [facility];  Service: Cardiovascular;  Laterality: Bilateral;    Current Medications: Outpatient Medications Prior to Visit  Medication Sig Dispense Refill  . albuterol (PROVENTIL  HFA;VENTOLIN HFA) 108 (90 Base) MCG/ACT inhaler Inhale 2 puffs into the lungs every 6 (six) hours as needed for wheezing or shortness of breath. 1 Inhaler 5  . Ascorbic Acid (VITAMIN C WITH ROSE HIPS) 1000 MG tablet Take 1,000 mg by mouth daily.    Marland Kitchen atorvastatin (LIPITOR) 40 MG tablet Take 1 tablet (40 mg total) by mouth daily. 30 tablet 11  . ELIQUIS 2.5 MG TABS tablet TAKE 1 TABLET BY MOUTH TWICE DAILY 180 tablet 0  . furosemide (LASIX) 40 MG tablet Take 1 tablet (40 mg total) by mouth daily. 90 tablet 1  . Hypromellose 0.2 % SOLN Apply 2 drops to eye as needed.     Marland Kitchen KRILL OIL PO Take 1 capsule by mouth daily.     Marland Kitchen latanoprost (XALATAN) 0.005 % ophthalmic solution 1 drop every morning.    Marland Kitchen losartan (COZAAR) 100 MG tablet Take 100 mg by mouth daily.    . metFORMIN (GLUCOPHAGE-XR) 500 MG 24 hr tablet Take 500 mg by mouth daily with breakfast.    . metoprolol tartrate (LOPRESSOR) 25 MG tablet TAKE 1 TABLET(25 MG) BY MOUTH TWICE DAILY 180 tablet 3  . Multiple Vitamin (MULTIVITAMIN WITH MINERALS) TABS Take 1 tablet by mouth daily.    Marland Kitchen  naproxen sodium (ANAPROX) 220 MG tablet Take 220 mg by mouth 2 (two) times daily as needed (PAIN).    Marland Kitchen omeprazole (PRILOSEC) 20 MG capsule Take 20 mg by mouth daily as needed (heartburn).     . potassium chloride SA (K-DUR,KLOR-CON) 20 MEQ tablet Take 1 tablet (20 mEq total) by mouth daily. 90 tablet 3  . propylthiouracil (PTU) 50 MG tablet Take 50 mg by mouth daily.    Marland Kitchen triamcinolone (NASACORT) 55 MCG/ACT AERO nasal inhaler Place 2 sprays into the nose daily as needed (ALLERGIES).    Marland Kitchen trolamine salicylate (ASPERCREME) 10 % cream Apply 1 application topically as needed for muscle pain.     No facility-administered medications prior to visit.     Allergies:   Patient has no known allergies.   Social History   Socioeconomic History  . Marital status: Married    Spouse name: Not on file  . Number of children: Not on file  . Years of education: Not on  file  . Highest education level: Not on file  Occupational History  . Not on file  Tobacco Use  . Smoking status: Current Every Day Smoker    Packs/day: 0.50    Years: 25.00    Pack years: 12.50    Types: Cigarettes, Pipe  . Smokeless tobacco: Never Used  . Tobacco comment: 08/04/2012 "still smoke a cigar q once in awhile"  Substance and Sexual Activity  . Alcohol use: Yes    Comment: OCCASIONAL  . Drug use: No  . Sexual activity: Never  Other Topics Concern  . Not on file  Social History Narrative  . Not on file   Social Determinants of Health   Financial Resource Strain: Not on file  Food Insecurity: Not on file  Transportation Needs: Not on file  Physical Activity: Not on file  Stress: Not on file  Social Connections: Not on file     Family History:  The patient's family history includes Coronary artery disease (age of onset: 73) in his father; Hypertension in his mother; Stroke (age of onset: 32) in his mother.   ROS:   Please see the history of present illness.    ROS All other systems are reviewed and are negative.   PHYSICAL EXAM:   VS:  BP 120/62   Ht 5\' 6"  (1.676 m)   Wt 150 lb 9.6 oz (68.3 kg)   SpO2 91%   BMI 24.31 kg/m    No difference in blood pressure between right and left upper extremities   General: Alert, oriented x3, no distress, elderly, but appears fit Head: no evidence of trauma, PERRL, EOMI, no exophtalmos or lid lag, no myxedema, no xanthelasma; normal ears, nose and oropharynx Neck: normal jugular venous pulsations and no hepatojugular reflux; brisk carotid pulses without delay and no carotid bruits Chest: clear to auscultation, no signs of consolidation by percussion or palpation, normal fremitus, symmetrical and full respiratory excursions Cardiovascular: normal position and quality of the apical impulse, irregular rhythm, normal first and widely split second heart sounds, no murmurs, rubs or gallops Abdomen: no tenderness or distention,  no masses by palpation, no abnormal pulsatility or arterial bruits, normal bowel sounds, no hepatosplenomegaly Extremities: no clubbing, cyanosis or edema; 2+ radial, ulnar and brachial pulses bilaterally; 2+ right femoral, posterior tibial and dorsalis pedis pulses; 2+ left femoral, posterior tibial and dorsalis pedis pulses; no subclavian or femoral bruits Neurological: grossly nonfocal Psych: Normal mood and affect   Wt Readings from Last 3  Encounters:  08/09/20 150 lb 9.6 oz (68.3 kg)  07/19/19 154 lb (69.9 kg)  05/12/18 165 lb (74.8 kg)      Studies/Labs Reviewed:   EKG:  EKG is ordered today.  It is very similar to previous tracings, showing atrial fibrillation (or possibly atypical atrial flutter) with well-controlled ventricular rate, chronic bifascicular block (RBBB plus LAFB).  LABS: February 2017 hemoglobin A1c 6.6%, cholesterol 105, triglycerides 85, HDL 25, LDL 64 August 2017, hemoglobin A1c 6.3%, cholesterol 97, triglycerides 73, HDL 25, LDL 57, creatinine 1.27 March 2017 hemoglobin A1c 7.3%, cholesterol 102, triglycerides 50, HDL 24, LDL 67, creatinine 0.93, TSH 1.26, potassium 4.31 March 2018 hemoglobin A1c 6.9%, cholesterol 106, HDL 28, LDL 65, triglycerides 69, creatinine 1.14, potassium 4.7  May 12, 2019 Total cholesterol 91, HDL 25, LDL 51, triglycerides 76 Hemoglobin A1c 6.8%, hemoglobin 12, creatinine 1.76, potassium 5.2, normal liver function tests and TSH  May 31, 2020 Total cholesterol 98, LDL 52, HDL 34, triglycerides 52 Hemoglobin A1c 6.6%, creatinine 1.11, TSH 0.99  ASSESSMENT:    1. Atypical atrial flutter ([place])   2. Chronic combined systolic and diastolic congestive heart failure, NYHA class 2 ([place])   3. Essential hypertension   4. Coronary artery disease involving native coronary artery of native heart without angina pectoris   5. PAD (peripheral artery disease) ([place])   6. Dyslipidemia (high LDL; low HDL)   7. Diabetes mellitus type  2 in nonobese (HCC)   8. Long term current use of anticoagulant   9. Pulmonary emphysema, unspecified emphysema type ([place])   10. Hyperthyroidism      PLAN:  In order of problems listed above:  1. AFib: Occasionally organizes into atrial flutter with a constant degree of AV block and appears to be regular, but he has permanent arrhythmia. CHADSVasc 6 (age 35, vascular disease, HTN, DM, CHF).  Asymptomatic and well rate controlled on a relatively low-dose of beta-blocker.  Note that prior to onset of atrial fibrillation he had a lot of issues with bradycardia and has bifascicular block.  Possible need for pacemaker in the future. 2. CHF: NYHA functional class II (chronic baseline and dyspnea more likely due to COPD), clinically euvolemic on a very low-dose of loop diuretic.  No changes made to his medications: He is on maximum dose losartan and an appropriate dose of beta-blocker for rate control.  Consider Entresto if EF drops. 3. HTN: Good control 4. CAD: Denies angina pectoris.  On statin and beta-blocker, not on aspirin due to full anticoagulation. 5. PAD: Asymptomatic. Has left subclavian stent. 6. HLP: He is active and very lean, but his HDL is persistently low.  Excellent LDL on statin 7. DM: Consistently well controlled on metformin monotherapy 8.  Eliquis: Dose adjusted for low body weight and age (even though his creatinine has improved) as well as very easy bruising/bleeding from fragile skin 9. COPD: Unchanged.  Continues to tolerate the beta-blocker without wheezing.  This is probably the biggest cause of his dyspnea. 10.  Hyperthyroidism: Well compensated on PTU.   Medication Adjustments/Labs and Tests Ordered: Current medicines are reviewed at length with the patient today.  Concerns regarding medicines are outlined above.  Medication changes, Labs and Tests ordered today are listed in the Patient Instructions below. Patient Instructions  Medication Instructions:  No  changes *If you need a refill on your cardiac medications before your next appointment, please call your pharmacy*   Lab Work: None ordered If you have labs (blood work) drawn today  and your tests are completely normal, you will receive your results only by: Marland Kitchen MyChart Message (if you have MyChart) OR . A paper copy in the mail If you have any lab test that is abnormal or we need to change your treatment, we will call you to review the results.   Testing/Procedures: None ordered   Follow-Up: At [facility], you and your health needs are our priority.  As part of our continuing mission to provide you with exceptional heart care, we have created designated Provider Care Teams.  These Care Teams include your primary Cardiologist (physician) and Advanced Practice Providers (APPs -  Physician Assistants and Nurse Practitioners) who all work together to provide you with the care you need, when you need it.  We recommend signing up for the patient portal called "MyChart".  Sign up information is provided on this After Visit Summary.  MyChart is used to connect with patients for Virtual Visits (Telemedicine).  Patients are able to view lab/test results, encounter notes, upcoming appointments, etc.  Non-urgent messages can be sent to your provider as well.   To learn more about what you can do with MyChart, go to [email or web address].    Your next appointment:   12 month(s)  The format for your next appointment:   In Person  Provider:   You may see Sanda Klein, MD or one of the following Advanced Practice Providers on your designated Care Team:    Almyra Deforest, PA-C  Fabian Sharp, [place] or   Roby Lofts, PA-C      Signed, Sanda Klein, MD  08/09/2020 8:33 AM    [facility] [address], [place], [place]  [address] Phone: [phone]; Fax: [phone]

## 2020-08-30 DIAGNOSIS — I4891 Unspecified atrial fibrillation: Secondary | ICD-10-CM | POA: Diagnosis not present

## 2020-08-30 DIAGNOSIS — E11319 Type 2 diabetes mellitus with unspecified diabetic retinopathy without macular edema: Secondary | ICD-10-CM | POA: Diagnosis not present

## 2020-08-30 DIAGNOSIS — I5042 Chronic combined systolic (congestive) and diastolic (congestive) heart failure: Secondary | ICD-10-CM | POA: Diagnosis not present

## 2020-08-30 DIAGNOSIS — I509 Heart failure, unspecified: Secondary | ICD-10-CM | POA: Diagnosis not present

## 2020-08-30 DIAGNOSIS — I10 Essential (primary) hypertension: Secondary | ICD-10-CM | POA: Diagnosis not present

## 2020-08-30 DIAGNOSIS — E782 Mixed hyperlipidemia: Secondary | ICD-10-CM | POA: Diagnosis not present

## 2020-08-30 DIAGNOSIS — H35033 Hypertensive retinopathy, bilateral: Secondary | ICD-10-CM | POA: Diagnosis not present

## 2020-08-30 DIAGNOSIS — H40059 Ocular hypertension, unspecified eye: Secondary | ICD-10-CM | POA: Diagnosis not present

## 2020-08-30 DIAGNOSIS — K219 Gastro-esophageal reflux disease without esophagitis: Secondary | ICD-10-CM | POA: Diagnosis not present

## 2020-08-30 DIAGNOSIS — N1832 Chronic kidney disease, stage 3b: Secondary | ICD-10-CM | POA: Diagnosis not present

## 2020-08-30 DIAGNOSIS — E1151 Type 2 diabetes mellitus with diabetic peripheral angiopathy without gangrene: Secondary | ICD-10-CM | POA: Diagnosis not present

## 2020-09-06 DIAGNOSIS — E119 Type 2 diabetes mellitus without complications: Secondary | ICD-10-CM | POA: Diagnosis not present

## 2020-09-06 DIAGNOSIS — Z961 Presence of intraocular lens: Secondary | ICD-10-CM | POA: Diagnosis not present

## 2020-09-06 DIAGNOSIS — H26491 Other secondary cataract, right eye: Secondary | ICD-10-CM | POA: Diagnosis not present

## 2020-09-06 DIAGNOSIS — H401131 Primary open-angle glaucoma, bilateral, mild stage: Secondary | ICD-10-CM | POA: Diagnosis not present

## 2020-09-14 ENCOUNTER — Other Ambulatory Visit: Payer: Self-pay | Admitting: Cardiovascular Disease

## 2020-10-22 DIAGNOSIS — I10 Essential (primary) hypertension: Secondary | ICD-10-CM | POA: Diagnosis not present

## 2020-10-22 DIAGNOSIS — I5042 Chronic combined systolic (congestive) and diastolic (congestive) heart failure: Secondary | ICD-10-CM | POA: Diagnosis not present

## 2020-10-22 DIAGNOSIS — H40059 Ocular hypertension, unspecified eye: Secondary | ICD-10-CM | POA: Diagnosis not present

## 2020-10-22 DIAGNOSIS — E782 Mixed hyperlipidemia: Secondary | ICD-10-CM | POA: Diagnosis not present

## 2020-10-22 DIAGNOSIS — I509 Heart failure, unspecified: Secondary | ICD-10-CM | POA: Diagnosis not present

## 2020-10-22 DIAGNOSIS — I4891 Unspecified atrial fibrillation: Secondary | ICD-10-CM | POA: Diagnosis not present

## 2020-10-22 DIAGNOSIS — N1832 Chronic kidney disease, stage 3b: Secondary | ICD-10-CM | POA: Diagnosis not present

## 2020-10-22 DIAGNOSIS — E1151 Type 2 diabetes mellitus with diabetic peripheral angiopathy without gangrene: Secondary | ICD-10-CM | POA: Diagnosis not present

## 2020-10-22 DIAGNOSIS — E11319 Type 2 diabetes mellitus with unspecified diabetic retinopathy without macular edema: Secondary | ICD-10-CM | POA: Diagnosis not present

## 2020-10-22 DIAGNOSIS — H35033 Hypertensive retinopathy, bilateral: Secondary | ICD-10-CM | POA: Diagnosis not present

## 2020-10-22 DIAGNOSIS — J439 Emphysema, unspecified: Secondary | ICD-10-CM | POA: Diagnosis not present

## 2020-10-29 ENCOUNTER — Other Ambulatory Visit: Payer: Self-pay | Admitting: Pharmacist

## 2020-10-29 MED ORDER — APIXABAN 2.5 MG PO TABS: 2.5000 mg | ORAL_TABLET | Freq: Two times a day (BID) | ORAL | 1 refills | Status: DC

## 2020-10-31 ENCOUNTER — Other Ambulatory Visit: Payer: Self-pay

## 2020-10-31 MED ORDER — FUROSEMIDE 40 MG PO TABS: 40.0000 mg | ORAL_TABLET | Freq: Every day | ORAL | 3 refills | Status: DC

## 2020-12-05 ENCOUNTER — Encounter (INDEPENDENT_AMBULATORY_CARE_PROVIDER_SITE_OTHER): Payer: Medicare Other | Admitting: Ophthalmology

## 2020-12-31 DIAGNOSIS — C44729 Squamous cell carcinoma of skin of left lower limb, including hip: Secondary | ICD-10-CM | POA: Diagnosis not present

## 2020-12-31 DIAGNOSIS — L570 Actinic keratosis: Secondary | ICD-10-CM | POA: Diagnosis not present

## 2020-12-31 DIAGNOSIS — C490 Malignant neoplasm of connective and soft tissue of head, face and neck: Secondary | ICD-10-CM | POA: Diagnosis not present

## 2020-12-31 DIAGNOSIS — Z85828 Personal history of other malignant neoplasm of skin: Secondary | ICD-10-CM | POA: Diagnosis not present

## 2020-12-31 DIAGNOSIS — L821 Other seborrheic keratosis: Secondary | ICD-10-CM | POA: Diagnosis not present

## 2021-01-01 ENCOUNTER — Other Ambulatory Visit: Payer: Self-pay

## 2021-01-01 ENCOUNTER — Ambulatory Visit (INDEPENDENT_AMBULATORY_CARE_PROVIDER_SITE_OTHER): Payer: Medicare Other | Admitting: Ophthalmology

## 2021-01-01 ENCOUNTER — Encounter (INDEPENDENT_AMBULATORY_CARE_PROVIDER_SITE_OTHER): Payer: Self-pay | Admitting: Ophthalmology

## 2021-01-01 DIAGNOSIS — H43813 Vitreous degeneration, bilateral: Secondary | ICD-10-CM

## 2021-01-01 DIAGNOSIS — E113291 Type 2 diabetes mellitus with mild nonproliferative diabetic retinopathy without macular edema, right eye: Secondary | ICD-10-CM | POA: Diagnosis not present

## 2021-01-01 DIAGNOSIS — H34231 Retinal artery branch occlusion, right eye: Secondary | ICD-10-CM | POA: Diagnosis not present

## 2021-01-01 NOTE — Assessment & Plan Note (Signed)
Stable diffuse retinal atrophy, no further complications no neovascular disease

## 2021-01-01 NOTE — Assessment & Plan Note (Signed)

## 2021-01-01 NOTE — Progress Notes (Signed)
01/01/2021     CHIEF COMPLAINT Patient presents for Retina Follow Up (1 Year F/U OU//Pt denies noticeable changes to [place] OU since last visit. Pt denies ocular pain, flashes of light, or floaters OU. /A1c: 6.8, 3 mo ago/LBS: does not check)   HISTORY OF PRESENT ILLNESS: Shaun Mclaughlin is a 86 y.o. male who presents to the clinic today for:   HPI    Retina Follow Up    Diagnosis: Diabetic Retinopathy   Laterality: both eyes   Onset: 1 year ago   Severity: mild   Duration: 1 year   Course: stable   Comments: 1 Year F/U OU  Pt denies noticeable changes to [place] OU since last visit. Pt denies ocular pain, flashes of light, or floaters OU.  A1c: 6.8, 3 mo ago LBS: does not check       Last edited by Rockie Neighbours, [place] on 01/01/2021 10:13 AM. (History)      Referring physician: Antony Contras, MD [place],  [place] [address]  HISTORICAL INFORMATION:   Selected notes from the MEDICAL RECORD NUMBER       CURRENT MEDICATIONS: Current Outpatient Medications (Ophthalmic Drugs)  Medication Sig  . Hypromellose 0.2 % SOLN Apply 2 drops to eye as needed.   . latanoprost (XALATAN) 0.005 % ophthalmic solution 1 drop every morning.   No current facility-administered medications for this visit. (Ophthalmic Drugs)   Current Outpatient Medications (Other)  Medication Sig  . albuterol (PROVENTIL HFA;VENTOLIN HFA) 108 (90 Base) MCG/ACT inhaler Inhale 2 puffs into the lungs every 6 (six) hours as needed for wheezing or shortness of breath.  Marland Kitchen apixaban (ELIQUIS) 2.5 MG TABS tablet Take 1 tablet (2.5 mg total) by mouth 2 (two) times daily.  . Ascorbic Acid (VITAMIN C WITH ROSE HIPS) 1000 MG tablet Take 1,000 mg by mouth daily.  Marland Kitchen atorvastatin (LIPITOR) 40 MG tablet Take 1 tablet (40 mg total) by mouth daily.  . furosemide (LASIX) 40 MG tablet Take 1 tablet (40 mg total) by mouth daily.  Marland Kitchen KRILL OIL PO Take 1 capsule by mouth daily.   Marland Kitchen losartan (COZAAR) 100 MG tablet  Take 100 mg by mouth daily.  . metFORMIN (GLUCOPHAGE-XR) 500 MG 24 hr tablet Take 500 mg by mouth daily with breakfast.  . metoprolol tartrate (LOPRESSOR) 25 MG tablet TAKE 1 TABLET(25 MG) BY MOUTH TWICE DAILY  . Multiple Vitamin (MULTIVITAMIN WITH MINERALS) TABS Take 1 tablet by mouth daily.  . naproxen sodium (ANAPROX) 220 MG tablet Take 220 mg by mouth 2 (two) times daily as needed (PAIN).  Marland Kitchen omeprazole (PRILOSEC) 20 MG capsule Take 20 mg by mouth daily as needed (heartburn).   . potassium chloride SA (K-DUR,KLOR-CON) 20 MEQ tablet Take 1 tablet (20 mEq total) by mouth daily.  Marland Kitchen propylthiouracil (PTU) 50 MG tablet Take 50 mg by mouth daily.  Marland Kitchen triamcinolone (NASACORT) 55 MCG/ACT AERO nasal inhaler Place 2 sprays into the nose daily as needed (ALLERGIES).  Marland Kitchen trolamine salicylate (ASPERCREME) 10 % cream Apply 1 application topically as needed for muscle pain.   No current facility-administered medications for this visit. (Other)      REVIEW OF SYSTEMS:    ALLERGIES No Known Allergies  PAST MEDICAL HISTORY Past Medical History:  Diagnosis Date  . Acute on chronic combined systolic and diastolic congestive heart failure, NYHA class 2 ([place]) 06/29/2014  . Arthritis    "maybe a little bit in some joints" (08/04/2012)  . Atherosclerotic  renal artery stenosis, unilateral (H[facility])    lleft renal artery stenosis by duplex ultrasound  . CAD (coronary artery disease),Hx RCA stenting-patent 06/2012  08/05/2012  . Chronic anticoagulation, coumadin for PAF 08/05/2012  . Chronic bronchitis ([place])    "about q yr" (08/04/2012)  . DM (diabetes mellitus) ([place]) 08/05/2012  . External bleeding hemorrhoids ~ 2003; 2008; 2013   "removed polyps and no bleeding since" (08/04/2012)  . GERD (gastroesophageal reflux disease)   . Hip fracture ([place]) 10/2016   RIGHT FEMORAL NECK   . HTN (hypertension) 08/05/2012  . Hypercholesteremia   . Hyperlipidemia 08/05/2012  . Hyperthyroidism   . Kidney stone 1980's  . PAF  (paroxysmal atrial fibrillation), maintaining SR 08/05/2012  . Pneumonia ~ 2008  . PVD (peripheral vascular disease) with claudication, Lt Upper ext. pain, and known 80-90%distal Lt. subclavian artery stenosis  08/05/2012  . S/P angioplasty with stent to Lt. subclavian artery -Nitrinol stent 08/04/12 08/05/2012  . Type II diabetes mellitus ([place])    "take RX; I'm prediabetic; don't have to  check my CBG qd; keep my weight down" (08/04/2012)   Past Surgical History:  Procedure Laterality Date  . CARDIAC CATHETERIZATION  2008 and Dec 2013   patent coronaries  . CARDIOVERSION N/A 07/13/2014   Procedure: CARDIOVERSION;  Surgeon: Sanda Klein, MD;  Location: [facility];  Service: Cardiovascular;  Laterality: N/A;  . CARDIOVERSION N/A 09/28/2014   Procedure: CARDIOVERSION;  Surgeon: Dorothy Spark, MD;  Location: [facility];  Service: Cardiovascular;  Laterality: N/A;  . CATARACT EXTRACTION W/ INTRAOCULAR LENS IMPLANT     "right eye" (08/04/2012)  . CORONARY ANGIOPLASTY WITH STENT PLACEMENT  2003   patent 12/13  . GLAUCOMA SURGERY  2000's   "right eye; had laser procedure  to lower the pressure and prevent glaucoma" (08/04/2012)  . HERNIA REPAIR  5885'O   "umbilical" (09/02/7410)  . INGUINAL HERNIA REPAIR  2000's   "right" (08/04/2012)  . KIDNEY STONE SURGERY  1980's  . LEFT HEART CATHETERIZATION WITH CORONARY ANGIOGRAM N/A 06/28/2012   Procedure: LEFT HEART CATHETERIZATION WITH CORONARY ANGIOGRAM;  Surgeon: Lorretta Harp, MD;  Location: [facility] CATH LAB;  Service: Cardiovascular;  Laterality: N/A;  . LITHOTRIPSY  1980's   "imploded in my kidney; ended up having to be cut open in my back" (08/04/2012)  . SKIN CANCER EXCISION  2017 & 2018  . SUBCLAVIAN STENT PLACEMENT  Jan 2014   Lt SCA  . TONSILLECTOMY  1940's  . TOTAL HIP ARTHROPLASTY Right 10/27/2016   Procedure: TOTAL HIP ARTHROPLASTY ANTERIOR APPROACH;  Surgeon: Rod Can, MD;  Location: [facility];  Service: Orthopedics;  Laterality: Right;  .  UNILATERAL UPPER EXTREMEITY ANGIOGRAM N/A 08/04/2012   Procedure: UNILATERAL UPPER EXTREMEITY ANGIOGRAM;  Surgeon: Lorretta Harp, MD;  Location[facility] CATH LAB;  Service: Cardiovascular;  Laterality: N/A;  . UPPER EXTREMITY ANGIOGRAM Bilateral 06/28/2012   Procedure: UPPER EXTREMITY ANGIOGRAM;  Surgeon: Lorretta Harp, MD;  [facility];  Service: Cardiovascular;  Laterality: Bilateral;    FAMILY HISTORY Family History  Problem Relation Age of Onset  . Stroke Mother 4  . Hypertension Mother   . Coronary artery disease Father 68    SOCIAL HISTORY Social History   Tobacco Use  . Smoking status: Current Every Day Smoker    Packs/day: 0.50    Years: 25.00    Pack years: 12.50    Types: Cigarettes, Pipe  . Smokeless tobacco: Never Used  . Tobacco comment: 08/04/2012 "still smoke a  cigar q once in awhile"  Substance Use Topics  . Alcohol use: Yes    Comment: OCCASIONAL  . Drug use: No         OPHTHALMIC EXAM: Base Eye Exam    Visual Acuity (ETDRS)      Right Left   Dist [place] 20/200 +1 20/25 +2   Dist ph [place] 20/60 -2        Tonometry (Tonopen, 10:18 AM)      Right Left   Pressure 14 17       Pupils      Pupils Dark Light Shape React APD   Right PERRL 4 4 Round Minimal None   Left PERRL 4 4 Round Minimal None       Visual Fields (Counting fingers)      Left Right    Full Full       Extraocular Movement      Right Left    Full Full       Neuro/Psych    Oriented x3: Yes   Mood/Affect: Normal       Dilation    Both eyes: 1.0% Mydriacyl, 2.5% Phenylephrine @ 10:18 AM        Slit Lamp and Fundus Exam    External Exam      Right Left   External Normal Normal       Slit Lamp Exam      Right Left   Lids/Lashes Normal Normal   Conjunctiva/Sclera White and quiet White and quiet   Cornea Clear Clear   Anterior Chamber Deep and quiet Deep and quiet   Iris Round and reactive Round and reactive   Lens Posterior chamber intraocular lens Posterior  chamber intraocular lens   Anterior Vitreous Normal Normal       Fundus Exam      Right Left   Posterior Vitreous Posterior vitreous detachment Normal   Disc Normal Normal   C/D Ratio 0.1 0.0   Macula Normal Normal   Vessels Attenuated Attenuated   Periphery Normal Normal          IMAGING AND PROCEDURES  Imaging and Procedures for 01/01/21  OCT, Retina - OU - Both Eyes       Right Eye Quality was good. Scan locations included subfoveal. Central Foveal Thickness: 256. Progression has been stable. Findings include abnormal foveal contour, outer retinal atrophy, inner retinal atrophy (Sector macular atrophy, inferior to the foveal avascular zone along the inferotemporal branch retinal artery occlusion from the past.).   Left Eye Quality was good. Scan locations included subfoveal. Central Foveal Thickness: 285. Progression has been stable. Findings include normal observations.   Notes OD, no active maculopathy diffuse macular atrophy, stable  No active disease OU                ASSESSMENT/PLAN:  Branch retinal artery occlusion of right eye Stable diffuse retinal atrophy, no further complications no neovascular disease  Posterior vitreous detachment of both eyes  The nature of posterior vitreous detachment was discussed with the patient as well as its physiology, its age prevalence, and its possible implication regarding retinal breaks and detachment.  An informational brochure was offered to the patient.  All the patient's questions were answered.  The patient was asked to return if new or different flashes or floaters develops.   Patient was instructed to contact office immediately if any new changes were noticed. I explained to the patient that vitreous inside the eye is similar to jello  inside a bowl. As the jello melts it can start to pull away from the bowl, similarly the vitreous throughout our lives can begin to pull away from the retina. That process is called a  posterior vitreous detachment. In some cases, the vitreous can tug hard enough on the retina to form a retinal tear. I discussed with the patient the signs and symptoms of a retinal detachment.  Do not rub the eye.        ICD-10-CM   1. Nonproliferative diabetic retinopathy of right eye ([place])  E11.3291 OCT, Retina - OU - Both Eyes  2. Branch retinal artery occlusion of right eye  H34.231   3. Posterior vitreous detachment of both eyes  H43.813     1.  Very mild nonproliferative diabetic retinopathy in the right eye and now almost quiescent secondary to ischemic disease induced  Retinal artery occlusion right eye which is chronic and is affecting the quality of vision.  No complications.  Stable over time.  2.  Posterior vitreous detachment OU Minor, no other disease active.  3.  Follow-up with Dr. Kathlen Mody as scheduled.  Ophthalmic Meds Ordered this visit:  No orders of the defined types were placed in this encounter.      Return in about 2 years (around 01/02/2023) for DILATE OU, COLOR FP, OCT.  There are no Patient Instructions on file for this visit.   Explained the diagnoses, plan, and follow up with the patient and they expressed understanding.  Patient expressed understanding of the importance of proper follow up care.   Clent Demark Eulonda Andalon M.D. Diseases & Surgery of the Retina and Vitreous Retina & Diabetic [place] 01/01/21     Abbreviations: M myopia (nearsighted); A astigmatism; H hyperopia (farsighted); P presbyopia; Mrx spectacle prescription;  CTL contact lenses; OD right eye; OS left eye; OU both eyes  XT exotropia; ET esotropia; PEK punctate epithelial keratitis; PEE punctate epithelial erosions; DES dry eye syndrome; MGD meibomian gland dysfunction; ATs artificial tears; PFAT's preservative free artificial tears; [place] nuclear sclerotic cataract; PSC posterior subcapsular cataract; ERM epi-retinal membrane; PVD posterior vitreous detachment; RD retinal detachment; DM  diabetes mellitus; DR diabetic retinopathy; NPDR non-proliferative diabetic retinopathy; PDR proliferative diabetic retinopathy; CSME clinically significant macular edema; DME diabetic macular edema; dbh dot blot hemorrhages; CWS cotton wool spot; POAG primary open angle glaucoma; C/D cup-to-disc ratio; HVF humphrey visual field; GVF goldmann visual field; OCT optical coherence tomography; IOP intraocular pressure; BRVO Branch retinal vein occlusion; CRVO central retinal vein occlusion; CRAO central retinal artery occlusion; BRAO branch retinal artery occlusion; RT retinal tear; SB scleral buckle; PPV pars plana vitrectomy; VH Vitreous hemorrhage; PRP panretinal laser photocoagulation; IVK intravitreal kenalog; VMT vitreomacular traction; MH Macular hole;  NVD neovascularization of the disc; NVE neovascularization elsewhere; AREDS age related eye disease study; ARMD age related macular degeneration; POAG primary open angle glaucoma; EBMD epithelial/anterior basement membrane dystrophy; ACIOL anterior chamber intraocular lens; IOL intraocular lens; PCIOL posterior chamber intraocular lens; Phaco/IOL phacoemulsification with intraocular lens placement; [place] photorefractive keratectomy; LASIK laser assisted in situ keratomileusis; HTN hypertension; DM diabetes mellitus; COPD chronic obstructive pulmonary disease

## 2021-02-06 DIAGNOSIS — I5042 Chronic combined systolic (congestive) and diastolic (congestive) heart failure: Secondary | ICD-10-CM | POA: Diagnosis not present

## 2021-02-06 DIAGNOSIS — I509 Heart failure, unspecified: Secondary | ICD-10-CM | POA: Diagnosis not present

## 2021-02-06 DIAGNOSIS — I4891 Unspecified atrial fibrillation: Secondary | ICD-10-CM | POA: Diagnosis not present

## 2021-02-06 DIAGNOSIS — E782 Mixed hyperlipidemia: Secondary | ICD-10-CM | POA: Diagnosis not present

## 2021-02-06 DIAGNOSIS — K219 Gastro-esophageal reflux disease without esophagitis: Secondary | ICD-10-CM | POA: Diagnosis not present

## 2021-02-06 DIAGNOSIS — H40059 Ocular hypertension, unspecified eye: Secondary | ICD-10-CM | POA: Diagnosis not present

## 2021-02-06 DIAGNOSIS — E1151 Type 2 diabetes mellitus with diabetic peripheral angiopathy without gangrene: Secondary | ICD-10-CM | POA: Diagnosis not present

## 2021-02-06 DIAGNOSIS — N183 Chronic kidney disease, stage 3 unspecified: Secondary | ICD-10-CM | POA: Diagnosis not present

## 2021-02-06 DIAGNOSIS — E11319 Type 2 diabetes mellitus with unspecified diabetic retinopathy without macular edema: Secondary | ICD-10-CM | POA: Diagnosis not present

## 2021-02-06 DIAGNOSIS — J439 Emphysema, unspecified: Secondary | ICD-10-CM | POA: Diagnosis not present

## 2021-02-06 DIAGNOSIS — I10 Essential (primary) hypertension: Secondary | ICD-10-CM | POA: Diagnosis not present

## 2021-02-06 DIAGNOSIS — H35033 Hypertensive retinopathy, bilateral: Secondary | ICD-10-CM | POA: Diagnosis not present

## 2021-02-07 DIAGNOSIS — Z7984 Long term (current) use of oral hypoglycemic drugs: Secondary | ICD-10-CM | POA: Diagnosis not present

## 2021-02-07 DIAGNOSIS — E1151 Type 2 diabetes mellitus with diabetic peripheral angiopathy without gangrene: Secondary | ICD-10-CM | POA: Diagnosis not present

## 2021-02-07 DIAGNOSIS — E0591 Thyrotoxicosis, unspecified with thyrotoxic crisis or storm: Secondary | ICD-10-CM | POA: Diagnosis not present

## 2021-02-07 DIAGNOSIS — I4891 Unspecified atrial fibrillation: Secondary | ICD-10-CM | POA: Diagnosis not present

## 2021-02-07 DIAGNOSIS — I509 Heart failure, unspecified: Secondary | ICD-10-CM | POA: Diagnosis not present

## 2021-02-07 DIAGNOSIS — D6869 Other thrombophilia: Secondary | ICD-10-CM | POA: Diagnosis not present

## 2021-02-07 DIAGNOSIS — E782 Mixed hyperlipidemia: Secondary | ICD-10-CM | POA: Diagnosis not present

## 2021-02-07 DIAGNOSIS — M25552 Pain in left hip: Secondary | ICD-10-CM | POA: Diagnosis not present

## 2021-02-07 DIAGNOSIS — I10 Essential (primary) hypertension: Secondary | ICD-10-CM | POA: Diagnosis not present

## 2021-02-07 DIAGNOSIS — N1832 Chronic kidney disease, stage 3b: Secondary | ICD-10-CM | POA: Diagnosis not present

## 2021-02-07 DIAGNOSIS — K219 Gastro-esophageal reflux disease without esophagitis: Secondary | ICD-10-CM | POA: Diagnosis not present

## 2021-02-08 DIAGNOSIS — E875 Hyperkalemia: Secondary | ICD-10-CM | POA: Diagnosis not present

## 2021-02-08 DIAGNOSIS — N1832 Chronic kidney disease, stage 3b: Secondary | ICD-10-CM | POA: Diagnosis not present

## 2021-05-19 ENCOUNTER — Other Ambulatory Visit: Payer: Self-pay | Admitting: Cardiovascular Disease

## 2021-05-19 NOTE — Telephone Encounter (Signed)
Prescription refill request for Eliquis received.  Indication: aflutter Last office visit:[place], 08/09/2020 Scr: 1.63, 02/07/2021, via kpn  Age: 86 yo  Weight: 68.3 kg   Refill sent.

## 2021-06-27 DIAGNOSIS — Z85828 Personal history of other malignant neoplasm of skin: Secondary | ICD-10-CM | POA: Diagnosis not present

## 2021-06-27 DIAGNOSIS — L570 Actinic keratosis: Secondary | ICD-10-CM | POA: Diagnosis not present

## 2021-07-04 DIAGNOSIS — I509 Heart failure, unspecified: Secondary | ICD-10-CM | POA: Diagnosis not present

## 2021-07-04 DIAGNOSIS — E782 Mixed hyperlipidemia: Secondary | ICD-10-CM | POA: Diagnosis not present

## 2021-07-04 DIAGNOSIS — I5042 Chronic combined systolic (congestive) and diastolic (congestive) heart failure: Secondary | ICD-10-CM | POA: Diagnosis not present

## 2021-07-04 DIAGNOSIS — E0591 Thyrotoxicosis, unspecified with thyrotoxic crisis or storm: Secondary | ICD-10-CM | POA: Diagnosis not present

## 2021-07-04 DIAGNOSIS — E1151 Type 2 diabetes mellitus with diabetic peripheral angiopathy without gangrene: Secondary | ICD-10-CM | POA: Diagnosis not present

## 2021-07-04 DIAGNOSIS — I4891 Unspecified atrial fibrillation: Secondary | ICD-10-CM | POA: Diagnosis not present

## 2021-07-04 DIAGNOSIS — I10 Essential (primary) hypertension: Secondary | ICD-10-CM | POA: Diagnosis not present

## 2021-07-04 DIAGNOSIS — J439 Emphysema, unspecified: Secondary | ICD-10-CM | POA: Diagnosis not present

## 2021-07-04 DIAGNOSIS — Z7984 Long term (current) use of oral hypoglycemic drugs: Secondary | ICD-10-CM | POA: Diagnosis not present

## 2021-07-04 DIAGNOSIS — N1832 Chronic kidney disease, stage 3b: Secondary | ICD-10-CM | POA: Diagnosis not present

## 2021-07-04 DIAGNOSIS — Z1389 Encounter for screening for other disorder: Secondary | ICD-10-CM | POA: Diagnosis not present

## 2021-07-04 DIAGNOSIS — Z0000 Encounter for general adult medical examination without abnormal findings: Secondary | ICD-10-CM | POA: Diagnosis not present

## 2021-09-24 ENCOUNTER — Ambulatory Visit (INDEPENDENT_AMBULATORY_CARE_PROVIDER_SITE_OTHER): Payer: Medicare Other

## 2021-09-24 ENCOUNTER — Other Ambulatory Visit: Payer: Self-pay

## 2021-09-24 ENCOUNTER — Ambulatory Visit: Payer: Medicare Other | Admitting: Cardiovascular Disease

## 2021-09-24 ENCOUNTER — Other Ambulatory Visit: Payer: Self-pay | Admitting: Cardiovascular Disease

## 2021-09-24 ENCOUNTER — Encounter: Payer: Self-pay | Admitting: Cardiovascular Disease

## 2021-09-24 VITALS — BP 132/72 | HR 72 | Ht 66.0 in | Wt 148.6 lb

## 2021-09-24 DIAGNOSIS — I452 Bifascicular block: Secondary | ICD-10-CM | POA: Diagnosis not present

## 2021-09-24 DIAGNOSIS — D6869 Other thrombophilia: Secondary | ICD-10-CM | POA: Diagnosis not present

## 2021-09-24 DIAGNOSIS — E0590 Thyrotoxicosis, unspecified without thyrotoxic crisis or storm: Secondary | ICD-10-CM | POA: Diagnosis not present

## 2021-09-24 DIAGNOSIS — I5042 Chronic combined systolic (congestive) and diastolic (congestive) heart failure: Secondary | ICD-10-CM | POA: Diagnosis not present

## 2021-09-24 DIAGNOSIS — R5383 Other fatigue: Secondary | ICD-10-CM

## 2021-09-24 DIAGNOSIS — J432 Centrilobular emphysema: Secondary | ICD-10-CM

## 2021-09-24 DIAGNOSIS — I484 Atypical atrial flutter: Secondary | ICD-10-CM

## 2021-09-24 DIAGNOSIS — E785 Hyperlipidemia, unspecified: Secondary | ICD-10-CM

## 2021-09-24 DIAGNOSIS — I10 Essential (primary) hypertension: Secondary | ICD-10-CM

## 2021-09-24 DIAGNOSIS — E119 Type 2 diabetes mellitus without complications: Secondary | ICD-10-CM | POA: Diagnosis not present

## 2021-09-24 DIAGNOSIS — I2510 Atherosclerotic heart disease of native coronary artery without angina pectoris: Secondary | ICD-10-CM | POA: Diagnosis not present

## 2021-09-24 DIAGNOSIS — I739 Peripheral vascular disease, unspecified: Secondary | ICD-10-CM | POA: Diagnosis not present

## 2021-09-24 DIAGNOSIS — D649 Anemia, unspecified: Secondary | ICD-10-CM

## 2021-09-24 LAB — BASIC METABOLIC PANEL
BUN/Creatinine Ratio: 15 (ref 10–24)
BUN: 20 mg/dL (ref 8–27)
CO2: 28 mmol/L (ref 20–29)
Calcium: 9.2 mg/dL (ref 8.6–10.2)
Chloride: 99 mmol/L (ref 96–106)
Creatinine, Ser: 1.35 mg/dL — ABNORMAL HIGH (ref 0.76–1.27)
Glucose: 125 mg/dL — ABNORMAL HIGH (ref 70–99)
Potassium: 5.7 mmol/L — ABNORMAL HIGH (ref 3.5–5.2)
Sodium: 141 mmol/L (ref 134–144)
eGFR: 52 mL/min/{1.73_m2} — ABNORMAL LOW (ref >59)

## 2021-09-24 LAB — CBC
Hematocrit: 34.6 % — ABNORMAL LOW (ref 37.5–51.0)
Hemoglobin: 10.9 g/dL — ABNORMAL LOW (ref 13.0–17.7)
MCH: 27.7 pg (ref 26.6–33.0)
MCHC: 31.5 g/dL (ref 31.5–35.7)
MCV: 88 fL (ref 79–97)
Platelets: 258 10*3/uL (ref 150–450)
RBC: 3.94 x10E6/uL — ABNORMAL LOW (ref 4.14–5.80)
RDW: 17.1 % — ABNORMAL HIGH (ref 11.6–15.4)
WBC: 5.5 10*3/uL (ref 3.4–10.8)

## 2021-09-24 MED ORDER — METOPROLOL TARTRATE 25 MG PO TABS: 12.5000 mg | ORAL_TABLET | Freq: Two times a day (BID) | ORAL | 3 refills | Status: DC

## 2021-09-24 NOTE — Patient Instructions (Signed)
Medication Instructions:  ?Decrease Metoprolol to 12.5 mg twice daily  ? ?*If you need a refill on your cardiac medications before your next appointment, please call your pharmacy* ? ? ?Lab Work: ?CBC, BMET today  ? ?If you have labs (blood work) drawn today and your tests are completely normal, you will receive your results only by: ?MyChart Message (if you have MyChart) OR ?A paper copy in the mail ?If you have any lab test that is abnormal or we need to change your treatment, we will call you to review the results. ? ? ?Testing/Procedures: ?ZIO XT- Long Term Monitor Instructions ? ?Your physician has requested you wear a ZIO patch monitor for 3 days.  ?This is a single patch monitor. [employer] supplies one patch monitor per enrollment. Additional ?stickers are not available. Please do not apply patch if you will be having a Nuclear Stress Test,  ?Echocardiogram, Cardiac CT, MRI, or Chest Xray during the period you would be wearing the  ?monitor. The patch cannot be worn during these tests. You cannot remove and re-apply the  ?ZIO XT patch monitor.  ?Your ZIO patch monitor will be mailed 3 day USPS to your address on file. It may take 3-5 days  ?to receive your monitor after you have been enrolled.  ?Once you have received your monitor, please review the enclosed instructions. Your monitor  ?has already been registered assigning a specific monitor serial # to you. ? ?Billing and Patient Assistance Program Information ? ?We have supplied [employer] with any of your insurance information on file for billing purposes. ?[employer] offers a sliding scale Patient Assistance Program for patients that do not have  ?insurance, or whose insurance does not completely cover the cost of the ZIO monitor.  ?You must apply for the Patient Assistance Program to qualify for this discounted rate.  ?To apply, please call [employer] at [phone], select option 4, select option 2, ask to apply for  ?Patient Assistance Program. Theodore Demark will ask  your household income, and how many people  ?are in your household. They will quote your out-of-pocket cost based on that information.  ?[employer] will also be able to set up a 106-month, interest-free payment plan if needed. ? ?Applying the monitor ?  ?Shave hair from upper left chest.  ?Hold abrader disc by orange tab. Rub abrader in 40 strokes over the upper left chest as  ?indicated in your monitor instructions.  ?Clean area with 4 enclosed alcohol pads. Let dry.  ?Apply patch as indicated in monitor instructions. Patch will be placed under collarbone on left  ?side of chest with arrow pointing upward.  ?Rub patch adhesive wings for 2 minutes. Remove white label marked "1". Remove the white  ?label marked "2". Rub patch adhesive wings for 2 additional minutes.  ?While looking in a mirror, press and release button in center of patch. A small green light will  ?flash 3-4 times. This will be your only indicator that the monitor has been turned on.  ?Do not shower for the first 24 hours. You may shower after the first 24 hours.  ?Press the button if you feel a symptom. You will hear a small click. Record Date, Time and  ?Symptom in the Patient Logbook.  ?When you are ready to remove the patch, follow instructions on the last 2 pages of Patient  ?Logbook. Stick patch monitor onto the last page of Patient Logbook.  ?Place Patient Logbook in the blue and white box. Use locking tab on box and  tape box closed  ?securely. The blue and white box has prepaid postage on it. Please place it in the mailbox as  ?soon as possible. Your physician should have your test results approximately 7 days after the  ?monitor has been mailed back to [facility].  ?Call [facility] at [phone] if you have questions regarding  ?your ZIO XT patch monitor. Call them immediately if you see an orange light blinking on your  ?monitor.  ?If your monitor falls off in less than 4 days, contact our Monitor department at  [phone].  ?If your monitor becomes loose or falls off after 4 days call [employer] at [phone] for  ?suggestions on securing your monitor  ? ? ?Follow-Up: ?At [facility], you and your health needs are our priority.  As part of our continuing mission to provide you with exceptional heart care, we have created designated Provider Care Teams.  These Care Teams include your primary Cardiologist (physician) and Advanced Practice Providers (APPs -  Physician Assistants and Nurse Practitioners) who all work together to provide you with the care you need, when you need it. ? ?We recommend signing up for the patient portal called "MyChart".  Sign up information is provided on this After Visit Summary.  MyChart is used to connect with patients for Virtual Visits (Telemedicine).  Patients are able to view lab/test results, encounter notes, upcoming appointments, etc.  Non-urgent messages can be sent to your provider as well.   ?To learn more about what you can do with MyChart, go to [email or web address].   ? ?Your next appointment:   ?12 month(s) ? ?The format for your next appointment:   ?In Person ? ?Provider:   ?Sanda Klein, MD   ? ? ?

## 2021-09-24 NOTE — Progress Notes (Signed)
Patient ID: Shaun Mclaughlin, male   DOB: 1934-09-29, 87 y.o.   MRN: [id number]    Cardiology Office Note    Date:  09/26/2021   ID:  Shaun Mclaughlin, DOB 01-04-1935, MRN [id number]  PCP:  Antony Contras, MD  Cardiologist:   Sanda Klein, MD   Chief Complaint  Patient presents with   Follow-up    History of Present Illness:  Shaun Mclaughlin is a 87 y.o. male with a long-standing history of atrial arrhythmia (persistent atrial fibrillation and atrial flutter requiring cardioversion in the past), now probably permanent atrial fibrillation, peripheral arterial disease, coronary artery disease (last revascularization procedure left subclavian stent 2014), COPD, controlled type 2 diabetes mellitus and hypertension.  About a year ago, Shaun Mclaughlin was forced to retire, although he still enjoyed going to work 30 hours a week.  He is planning to move to [facility] with his wife.  Unfortunately this means that he will have to give up his 24-year-old Weimaraner.  Continues to have NYHA functional class II exertional dyspnea, not significantly changed since his last appointment.  Complains of a lot of fatigue, more so than dyspnea.  He does not have any chest pain either at rest or with activity.  He has not had any palpitations, dizziness or syncope.  He denies orthopnea, PND and lower extremity edema.  He has not had falls or bleeding problems.  He is in atrial flutter with 4: 1 AV block and consequently a very regular rhythm today.  Continues have good control of his risk factors.  Recent LDL was 67 and hemoglobin A1c was 6.7%.  Has a chronically low HDL cholesterol though he is lean and has been physically active.  He has had a number of minor skin surgeries for cancer.  He does not have a history of stroke or TIA. He has coronary artery disease and previously underwent placement of a stent to the right coronary artery in 2003, coronaries patent by cardiac catheterization in 2013. He has  hypertension, diabetes mellitus and hyperlipidemia all of which are well compensated. He has peripheral arterial disease and received a stent to the distal left subclavian artery in January 2014 for left arm claudication (10 x 40 mm Cordis Smart nitinol). He is also known to have a 50-60% left renal artery stenosis. Echocardiogram and nuclear stress testing have shown reduction in LV systolic function (72% by scintigraphy, 45-50 % by transthoracic echocardiography, 35% by transesophageal echo at the time of his last cardioversion). Nuclear stress testing shows an inferior wall scar without reversible ischemia (Nov 2013).     Past Medical History:  Diagnosis Date   Acute on chronic combined systolic and diastolic congestive heart failure, NYHA class 2 ([place]) 06/29/2014   Arthritis    "maybe a little bit in some joints" (08/04/2012)   Atherosclerotic renal artery stenosis, unilateral (HCC)    lleft renal artery stenosis by duplex ultrasound   CAD (coronary artery disease),Hx RCA stenting-patent 06/2012  08/05/2012   Chronic anticoagulation, coumadin for PAF 08/05/2012   Chronic bronchitis ([place])    "about q yr" (08/04/2012)   DM (diabetes mellitus) ([place]) 08/05/2012   External bleeding hemorrhoids ~ 2003; 2008; 2013   "removed polyps and no bleeding since" (08/04/2012)   GERD (gastroesophageal reflux disease)    Hip fracture ([place]) 10/2016   RIGHT FEMORAL NECK    HTN (hypertension) 08/05/2012   Hypercholesteremia    Hyperlipidemia 08/05/2012   Hyperthyroidism    Kidney stone 1980's  PAF (paroxysmal atrial fibrillation), maintaining SR 08/05/2012   Pneumonia ~ 2008   PVD (peripheral vascular disease) with claudication, Lt Upper ext. pain, and known 80-90%distal Lt. subclavian artery stenosis  08/05/2012   S/P angioplasty with stent to Lt. subclavian artery -Nitrinol stent 08/04/12 08/05/2012   Type II diabetes mellitus ([place])    "take RX; I'm prediabetic; don't have to  check my CBG qd; keep my weight down"  (08/04/2012)    Past Surgical History:  Procedure Laterality Date   CARDIAC CATHETERIZATION  2008 and Dec 2013   patent coronaries   CARDIOVERSION N/A 07/13/2014   Procedure: CARDIOVERSION;  Surgeon: Sanda Klein, MD;  Location: [facility];  Service: Cardiovascular;  Laterality: N/A;   CARDIOVERSION N/A 09/28/2014   Procedure: CARDIOVERSION;  Surgeon: Dorothy Spark, MD;  Location: [facility];  Service: Cardiovascular;  Laterality: N/A;   CATARACT EXTRACTION W/ INTRAOCULAR LENS IMPLANT     "right eye" (08/04/2012)   CORONARY ANGIOPLASTY WITH STENT PLACEMENT  2003   patent 12/13   GLAUCOMA SURGERY  2000's   "right eye; had laser procedure  to lower the pressure and prevent glaucoma" (08/04/2012)   HERNIA REPAIR  3532'D   "umbilical" (03/29/4267)   [place]  2000's   "right" (08/04/2012)   KIDNEY STONE SURGERY  1980's   LEFT HEART CATHETERIZATION WITH CORONARY ANGIOGRAM N/A 06/28/2012   Procedure: LEFT HEART CATHETERIZATION WITH CORONARY ANGIOGRAM;  Surgeon: Lorretta Harp, MD;  Location[facility] CATH LAB;  Service: Cardiovascular;  Laterality: N/A;   LITHOTRIPSY  1980's   "imploded in my kidney; ended up having to be cut open in my back" (08/04/2012)   SKIN CANCER EXCISION  2017 & 2018   SUBCLAVIAN STENT PLACEMENT  Jan 2014   Lt SCA   TONSILLECTOMY  1940's   TOTAL HIP ARTHROPLASTY Right 10/27/2016   Procedure: TOTAL HIP ARTHROPLASTY ANTERIOR APPROACH;  Surgeon: Rod Can, MD;  Location: [facility];  Service: Orthopedics;  Laterality: Right;   UNILATERAL UPPER EXTREMEITY ANGIOGRAM N/A 08/04/2012   Procedure: UNILATERAL UPPER EXTREMEITY ANGIOGRAM;  Surgeon: Lorretta Harp, MD;  Location: [facility] CATH LAB;  Service: Cardiovascular;  Laterality: N/A;   UPPER EXTREMITY ANGIOGRAM Bilateral 06/28/2012   Procedure: UPPER EXTREMITY ANGIOGRAM;  Surgeon: Lorretta Harp, MD;  Location: [facility] CATH LAB;  Service: Cardiovascular;  Laterality: Bilateral;    Current Medications: Outpatient  Medications Prior to Visit  Medication Sig Dispense Refill   albuterol (PROVENTIL HFA;VENTOLIN HFA) 108 (90 Base) MCG/ACT inhaler Inhale 2 puffs into the lungs every 6 (six) hours as needed for wheezing or shortness of breath. 1 Inhaler 5   apixaban (ELIQUIS) 2.5 MG TABS tablet TAKE 1 TABLET BY MOUTH 2 TIMES DAILY. 60 tablet 5   Ascorbic Acid (VITAMIN C WITH ROSE HIPS) 1000 MG tablet Take 1,000 mg by mouth daily.     atorvastatin (LIPITOR) 40 MG tablet Take 1 tablet (40 mg total) by mouth daily. 30 tablet 11   furosemide (LASIX) 40 MG tablet Take 1 tablet (40 mg total) by mouth daily. 90 tablet 3   Hypromellose 0.2 % SOLN Apply 2 drops to eye as needed.      KRILL OIL PO Take 1 capsule by mouth daily.      latanoprost (XALATAN) 0.005 % ophthalmic solution 1 drop every morning.     losartan (COZAAR) 100 MG tablet Take 100 mg by mouth daily.     metFORMIN (GLUCOPHAGE-XR) 500 MG 24 hr tablet Take 500 mg by mouth daily  with breakfast.     Multiple Vitamin (MULTIVITAMIN WITH MINERALS) TABS Take 1 tablet by mouth daily.     naproxen sodium (ANAPROX) 220 MG tablet Take 220 mg by mouth 2 (two) times daily as needed (PAIN).     omeprazole (PRILOSEC) 20 MG capsule Take 20 mg by mouth daily as needed (heartburn).      propylthiouracil (PTU) 50 MG tablet Take 50 mg by mouth daily.     triamcinolone (NASACORT) 55 MCG/ACT AERO nasal inhaler Place 2 sprays into the nose daily as needed (ALLERGIES).     trolamine salicylate (ASPERCREME) 10 % cream Apply 1 application topically as needed for muscle pain.     metoprolol tartrate (LOPRESSOR) 25 MG tablet TAKE 1 TABLET(25 MG) BY MOUTH TWICE DAILY 180 tablet 3   potassium chloride SA (K-DUR,KLOR-CON) 20 MEQ tablet Take 1 tablet (20 mEq total) by mouth daily. 90 tablet 3   No facility-administered medications prior to visit.     Allergies:   Patient has no known allergies.   Social History   Socioeconomic History   Marital status: Married    Spouse name: Not  on file   Number of children: Not on file   Years of education: Not on file   Highest education level: Not on file  Occupational History   Not on file  Tobacco Use   Smoking status: Every Day    Packs/day: 0.50    Years: 25.00    Pack years: 12.50    Types: Cigarettes, Pipe   Smokeless tobacco: Never   Tobacco comments:    08/04/2012 "still smoke a cigar q once in awhile"  Substance and Sexual Activity   Alcohol use: Yes    Comment: OCCASIONAL   Drug use: No   Sexual activity: Never  Other Topics Concern   Not on file  Social History Narrative   Not on file   Social Determinants of Health   Financial Resource Strain: Not on file  Food Insecurity: Not on file  Transportation Needs: Not on file  Physical Activity: Not on file  Stress: Not on file  Social Connections: Not on file     Family History:  The patient's family history includes Coronary artery disease (age of onset: 39) in his father; Hypertension in his mother; Stroke (age of onset: 10) in his mother.   ROS:   Please see the history of present illness.    ROS All other systems are reviewed and are negative.   PHYSICAL EXAM:   VS:  BP 132/72    Pulse 72    Ht 5\' 6"  (1.676 m)    Wt 148 lb 9.6 oz (67.4 kg)    SpO2 94%    BMI 23.98 kg/m    No difference in blood pressure between right and left upper extremities   General: Alert, oriented x3, no distress, appears a little pale.  Has scars of several recently removed skin cancers. Head: no evidence of trauma, PERRL, EOMI, no exophtalmos or lid lag, no myxedema, no xanthelasma; normal ears, nose and oropharynx Neck: normal jugular venous pulsations and no hepatojugular reflux; brisk carotid pulses without delay and no carotid bruits Chest: clear to auscultation, no signs of consolidation by percussion or palpation, normal fremitus, symmetrical and full respiratory excursions Cardiovascular: normal position and quality of the apical impulse, regular rhythm, normal  first and second heart sounds, no murmurs, rubs or gallops Abdomen: no tenderness or distention, no masses by palpation, no abnormal pulsatility or arterial  bruits, normal bowel sounds, no hepatosplenomegaly Extremities: no clubbing, cyanosis or edema; 2+ radial, ulnar and brachial pulses bilaterally; 2+ right femoral, posterior tibial and dorsalis pedis pulses; 2+ left femoral, posterior tibial and dorsalis pedis pulses; no subclavian or femoral bruits Neurological: grossly nonfocal Psych: Normal mood and affect  Wt Readings from Last 3 Encounters:  09/24/21 148 lb 9.6 oz (67.4 kg)  08/09/20 150 lb 9.6 oz (68.3 kg)  07/19/19 154 lb (69.9 kg)      Studies/Labs Reviewed:   EKG:  EKG is ordered today.  It shows atypical atrial flutter with 4: 1 AV block on a regular rhythm.  He has bifascicular block RBBB plus LAFB (chronic).  He has Q waves in leads I, aVL, V5 and V6.  The QRS is 132 ms.  QTc 490 ms. LABS: February 2017 hemoglobin A1c 6.6%, cholesterol 105, triglycerides 85, HDL 25, LDL 64 August 2017, hemoglobin A1c 6.3%, cholesterol 97, triglycerides 73, HDL 25, LDL 57, creatinine 1.27 March 2017 hemoglobin A1c 7.3%, cholesterol 102, triglycerides 50, HDL 24, LDL 67, creatinine 0.93, TSH 1.26, potassium 4.31 March 2018 hemoglobin A1c 6.9%, cholesterol 106, HDL 28, LDL 65, triglycerides 69, creatinine 1.14, potassium 4.7  May 12, 2019 Total cholesterol 91, HDL 25, LDL 51, triglycerides 76 Hemoglobin A1c 6.8%, hemoglobin 12, creatinine 1.76, potassium 5.2, normal liver function tests and TSH  May 31, 2020 Total cholesterol 98, LDL 52, HDL 34, triglycerides 52 Hemoglobin A1c 6.6%, creatinine 1.11, TSH 0.99  07/04/2021 Cholesterol 119, HDL 33, LDL 67, triglycerides 105 Hemoglobin A1c 6.7%, hemoglobin 10.7, creatinine 1.58, ALT 10.0, TSH 1.95  ASSESSMENT:    1. Atypical atrial flutter ([place])   2. Chronic combined systolic and diastolic congestive heart failure, NYHA  class 2 ([place])   3. Essential hypertension   4. Coronary artery disease involving native coronary artery of native heart without angina pectoris   5. PAD (peripheral artery disease) ([place])   6. Dyslipidemia (high LDL; low HDL)   7. Diabetes mellitus type 2 in nonobese (HCC)   8. Acquired thrombophilia ([place])   9. Centrilobular emphysema ([place])   10. Hyperthyroidism   11. Anemia, unspecified type   12. Fatigue, unspecified type      PLAN:  In order of problems listed above:  1. AFib/atypical atrial flutter: Often organized and regular, as it is today which gives a false impression that he may be in sinus rhythm.  His rate is controlled on a low-dose of beta-blocker.  Decrease the metoprolol dose.  Check a monitor to make sure he is not having periods of high-grade AV block that explains his fatigue.    Note that prior to onset of atrial fibrillation he had a lot of issues with bradycardia and has bifascicular block.  Possible need for pacemaker in the future. CHADSVasc 6 (age 30, vascular disease, HTN, DM, CHF). 2. CHF: Appears to be stable and clinically euvolemic on a very low-dose of loop diuretic.  NYHA functional class II (chronic baseline and dyspnea more likely due to COPD).He is on maximum dose losartan and an appropriate dose of beta-blocker for rate control.  Consider Entresto if EF drops.  We will schedule for repeat echocardiogram. 3. HTN: Well-controlled 4. CAD: Asymptomatic/no angina pectoris.  On statin and beta-blocker, not on aspirin due to full anticoagulation. 5. PAD: Asymptomatic. Has left subclavian stent. 6. HLP: Persistently low HDL despite lean body habitus and well-controlled diabetes.  Excellent LDL on statin. 7. DM: Consistently had good glycemic control on metformin. 8.  Eliquis: No serious bleeding problems.  Dose adjusted for age and elevated creatinine. 9. COPD: Unchanged.  Continues to tolerate the beta-blocker without wheezing.  This is probably the biggest cause  of his dyspnea. 10.  Hyperthyroidism: Well compensated on PTU. 11. Anemia: Contributing to fatigue.  Recheck hemoglobin.   Medication Adjustments/Labs and Tests Ordered: Current medicines are reviewed at length with the patient today.  Concerns regarding medicines are outlined above.  Medication changes, Labs and Tests ordered today are listed in the Patient Instructions below. Patient Instructions  Medication Instructions:  Decrease Metoprolol to 12.5 mg twice daily   *If you need a refill on your cardiac medications before your next appointment, please call your pharmacy*   Lab Work: CBC, BMET today   If you have labs (blood work) drawn today and your tests are completely normal, you will receive your results only by: [place] (if you have MyChart) OR A paper copy in the mail If you have any lab test that is abnormal or we need to change your treatment, we will call you to review the results.   Testing/Procedures: [place] [place]- Long Term Monitor Instructions  Your physician has requested you wear a ZIO patch monitor for 3 days.  This is a single patch monitor. [employer] supplies one patch monitor per enrollment. Additional stickers are not available. Please do not apply patch if you will be having a Nuclear Stress Test,  Echocardiogram, Cardiac CT, MRI, or Chest Xray during the period you would be wearing the  monitor. The patch cannot be worn during these tests. You cannot remove and re-apply the  ZIO XT patch monitor.  Your ZIO patch monitor will be mailed 3 day USPS to your address on file. It may take 3-5 days  to receive your monitor after you have been enrolled.  Once you have received your monitor, please review the enclosed instructions. Your monitor  has already been registered assigning a specific monitor serial # to you.  Billing and Patient Assistance Program Information  We have supplied [employer] with any of your insurance information on file for billing  purposes. [employer] offers a sliding scale Patient Assistance Program for patients that do not have  insurance, or whose insurance does not completely cover the cost of the ZIO monitor.  You must apply for the Patient Assistance Program to qualify for this discounted rate.  To apply, please call [employer] at [phone], select option 4, select option 2, ask to apply for  Patient Assistance Program. Theodore Demark will ask your household income, and how many people  are in your household. They will quote your out-of-pocket cost based on that information.  [employer] will also be able to set up a 59-month, interest-free payment plan if needed.  Applying the monitor   Shave hair from upper left chest.  Hold abrader disc by orange tab. Rub abrader in 40 strokes over the upper left chest as  indicated in your monitor instructions.  Clean area with 4 enclosed alcohol pads. Let dry.  Apply patch as indicated in monitor instructions. Patch will be placed under collarbone on left  side of chest with arrow pointing upward.  Rub patch adhesive wings for 2 minutes. Remove white label marked "1". Remove the white  label marked "2". Rub patch adhesive wings for 2 additional minutes.  While looking in a mirror, press and release button in center of patch. A small green light will  flash 3-4 times. This will be your only indicator that the monitor  has been turned on.  Do not shower for the first 24 hours. You may shower after the first 24 hours.  Press the button if you feel a symptom. You will hear a small click. Record Date, Time and  Symptom in the Patient Logbook.  When you are ready to remove the patch, follow instructions on the last 2 pages of Patient  Logbook. Stick patch monitor onto the last page of Patient Logbook.  Place Patient Logbook in the blue and white box. Use locking tab on box and tape box closed  securely. The blue and white box has prepaid postage on it. Please place it in the mailbox as  soon  as possible. Your physician should have your test results approximately 7 days after the  monitor has been mailed back to [facility].  Call [place] at [phone] if you have questions regarding  your ZIO XT patch monitor. Call them immediately if you see an orange light blinking on your  monitor.  If your monitor falls off in less than 4 days, contact our Monitor department at [phone].  If your monitor becomes loose or falls off after 4 days call [employer] at [phone] for  suggestions on securing your monitor    Follow-Up: At [facility], you and your health needs are our priority.  As part of our continuing mission to provide you with exceptional heart care, we have created designated Provider Care Teams.  These Care Teams include your primary Cardiologist (physician) and Advanced Practice Providers (APPs -  Physician Assistants and Nurse Practitioners) who all work together to provide you with the care you need, when you need it.  We recommend signing up for the patient portal called "MyChart".  Sign up information is provided on this After Visit Summary.  MyChart is used to connect with patients for Virtual Visits (Telemedicine).  Patients are able to view lab/test results, encounter notes, upcoming appointments, etc.  Non-urgent messages can be sent to your provider as well.   To learn more about what you can do with MyChart, go to [email or web address].    Your next appointment:   12 month(s)  The format for your next appointment:   In Person  Provider:   Sanda Klein, MD       Signed, Sanda Klein, MD  09/26/2021 5:45 PM    [address], [place], [place]  [address] Phone: [phone]; Fax: [phone]

## 2021-09-24 NOTE — Progress Notes (Unsigned)
Enrolled for [employer] to mail a ZIO XT long term holter monitor to the patients address on file.  

## 2021-09-25 ENCOUNTER — Other Ambulatory Visit: Payer: Self-pay

## 2021-09-25 DIAGNOSIS — Z79899 Other long term (current) drug therapy: Secondary | ICD-10-CM

## 2021-10-08 DIAGNOSIS — I484 Atypical atrial flutter: Secondary | ICD-10-CM | POA: Diagnosis not present

## 2021-10-08 DIAGNOSIS — R5383 Other fatigue: Secondary | ICD-10-CM | POA: Diagnosis not present

## 2021-10-08 DIAGNOSIS — I452 Bifascicular block: Secondary | ICD-10-CM | POA: Diagnosis not present

## 2021-10-09 ENCOUNTER — Other Ambulatory Visit: Payer: Self-pay

## 2021-10-09 ENCOUNTER — Ambulatory Visit (HOSPITAL_COMMUNITY): Payer: Medicare Other | Attending: Cardiology

## 2021-10-09 ENCOUNTER — Other Ambulatory Visit: Payer: Medicare Other

## 2021-10-09 DIAGNOSIS — I5042 Chronic combined systolic (congestive) and diastolic (congestive) heart failure: Secondary | ICD-10-CM

## 2021-10-09 DIAGNOSIS — I484 Atypical atrial flutter: Secondary | ICD-10-CM | POA: Insufficient documentation

## 2021-10-09 DIAGNOSIS — Z79899 Other long term (current) drug therapy: Secondary | ICD-10-CM | POA: Diagnosis not present

## 2021-10-09 DIAGNOSIS — R5383 Other fatigue: Secondary | ICD-10-CM

## 2021-10-09 DIAGNOSIS — I2510 Atherosclerotic heart disease of native coronary artery without angina pectoris: Secondary | ICD-10-CM

## 2021-10-09 LAB — BASIC METABOLIC PANEL
BUN/Creatinine Ratio: 15 (ref 10–24)
BUN: 24 mg/dL (ref 8–27)
CO2: 26 mmol/L (ref 20–29)
Calcium: 9.2 mg/dL (ref 8.6–10.2)
Chloride: 102 mmol/L (ref 96–106)
Creatinine, Ser: 1.59 mg/dL — ABNORMAL HIGH (ref 0.76–1.27)
Glucose: 101 mg/dL — ABNORMAL HIGH (ref 70–99)
Potassium: 5.3 mmol/L — ABNORMAL HIGH (ref 3.5–5.2)
Sodium: 141 mmol/L (ref 134–144)
eGFR: 42 mL/min/{1.73_m2} — ABNORMAL LOW (ref >59)

## 2021-10-09 LAB — ECHOCARDIOGRAM COMPLETE: S' Lateral: 3.9 cm

## 2021-10-09 MED ORDER — PERFLUTREN LIPID MICROSPHERE
1.0000 mL | INTRAVENOUS | Status: AC | PRN
  Administered 2021-10-09: 1 mL via INTRAVENOUS

## 2021-10-10 ENCOUNTER — Telehealth: Payer: Self-pay | Admitting: Cardiovascular Disease

## 2021-10-10 MED ORDER — METOPROLOL TARTRATE 25 MG PO TABS: 25.0000 mg | ORAL_TABLET | Freq: Two times a day (BID) | ORAL | 3 refills | Status: DC

## 2021-10-10 NOTE — Telephone Encounter (Signed)
Spoke to patient's daughter n law Caryl Pina. Monitor results given.Dr.Croitoru advised to increase Metoprolol tart.to 25 mg twice a day.Advised to call back if he experiences any symptoms of slow heart beat,dizziness,near syncope.Advised echo results not available yet. ?

## 2021-10-10 NOTE — Telephone Encounter (Signed)
Patient's daughter in law is requesting to discuss monitor results. She is currently with the patient. ?

## 2021-10-13 ENCOUNTER — Other Ambulatory Visit: Payer: Self-pay | Admitting: *Deleted

## 2021-10-13 DIAGNOSIS — I5042 Chronic combined systolic (congestive) and diastolic (congestive) heart failure: Secondary | ICD-10-CM

## 2021-10-13 MED ORDER — FUROSEMIDE 80 MG PO TABS: 80.0000 mg | ORAL_TABLET | Freq: Every day | ORAL | 1 refills | Status: DC

## 2021-10-13 MED ORDER — LOSARTAN POTASSIUM 50 MG PO TABS: 50.0000 mg | ORAL_TABLET | Freq: Every day | ORAL | 1 refills | Status: DC

## 2021-10-14 DIAGNOSIS — C44629 Squamous cell carcinoma of skin of left upper limb, including shoulder: Secondary | ICD-10-CM | POA: Diagnosis not present

## 2021-10-21 DIAGNOSIS — I5042 Chronic combined systolic (congestive) and diastolic (congestive) heart failure: Secondary | ICD-10-CM | POA: Diagnosis not present

## 2021-10-21 DIAGNOSIS — E1151 Type 2 diabetes mellitus with diabetic peripheral angiopathy without gangrene: Secondary | ICD-10-CM | POA: Diagnosis not present

## 2021-10-21 DIAGNOSIS — I10 Essential (primary) hypertension: Secondary | ICD-10-CM | POA: Diagnosis not present

## 2021-10-21 DIAGNOSIS — E782 Mixed hyperlipidemia: Secondary | ICD-10-CM | POA: Diagnosis not present

## 2021-10-27 ENCOUNTER — Other Ambulatory Visit: Payer: Self-pay

## 2021-10-27 DIAGNOSIS — I5042 Chronic combined systolic (congestive) and diastolic (congestive) heart failure: Secondary | ICD-10-CM

## 2021-10-27 LAB — BASIC METABOLIC PANEL
BUN/Creatinine Ratio: 15 (ref 10–24)
BUN: 19 mg/dL (ref 8–27)
CO2: 23 mmol/L (ref 20–29)
Calcium: 9.2 mg/dL (ref 8.6–10.2)
Chloride: 101 mmol/L (ref 96–106)
Creatinine, Ser: 1.24 mg/dL (ref 0.76–1.27)
Glucose: 170 mg/dL — ABNORMAL HIGH (ref 70–99)
Potassium: 4.2 mmol/L (ref 3.5–5.2)
Sodium: 138 mmol/L (ref 134–144)
eGFR: 57 mL/min/{1.73_m2} — ABNORMAL LOW (ref >59)

## 2021-10-28 ENCOUNTER — Encounter: Payer: Self-pay | Admitting: *Deleted

## 2021-10-28 LAB — BRAIN NATRIURETIC PEPTIDE: BNP: 163 pg/mL — ABNORMAL HIGH (ref 0.0–100.0)

## 2021-11-10 DIAGNOSIS — C44629 Squamous cell carcinoma of skin of left upper limb, including shoulder: Secondary | ICD-10-CM | POA: Diagnosis not present

## 2021-11-10 DIAGNOSIS — Z85828 Personal history of other malignant neoplasm of skin: Secondary | ICD-10-CM | POA: Diagnosis not present

## 2021-11-13 ENCOUNTER — Other Ambulatory Visit: Payer: Self-pay | Admitting: Cardiovascular Disease

## 2021-11-13 ENCOUNTER — Telehealth: Payer: Self-pay | Admitting: Cardiovascular Disease

## 2021-11-13 MED ORDER — APIXABAN 2.5 MG PO TABS: 2.5000 mg | ORAL_TABLET | Freq: Two times a day (BID) | ORAL | 5 refills | Status: DC

## 2021-11-13 NOTE — Telephone Encounter (Signed)
?*  STAT* If patient is at the pharmacy, call can be transferred to refill team. ? ? ?1. Which medications need to be refilled? (please list name of each medication and dose if known) new prescriptions Eliquis and Losartan ?2. Which pharmacy/location (including street and city if local pharmacy) is medication to be sent to?[facility] ,[place]-  [phone] and Fax is [phone] ? ?3. Do they need a 30 day or 90 day supply? 90 days and refills ? ?

## 2021-11-13 NOTE — Telephone Encounter (Signed)
Prescription refill request for Eliquis received. ?Indication:Aflutter ?Last office visit:3/23 ?Scr:1.2 ?Age: 87 ?Weight:67.4 kg ? ?Prescription refilled ? ?

## 2021-12-01 ENCOUNTER — Other Ambulatory Visit: Payer: Self-pay | Admitting: Cardiovascular Disease

## 2021-12-30 DIAGNOSIS — L309 Dermatitis, unspecified: Secondary | ICD-10-CM | POA: Diagnosis not present

## 2021-12-30 DIAGNOSIS — L570 Actinic keratosis: Secondary | ICD-10-CM | POA: Diagnosis not present

## 2021-12-30 DIAGNOSIS — Z85828 Personal history of other malignant neoplasm of skin: Secondary | ICD-10-CM | POA: Diagnosis not present

## 2021-12-30 DIAGNOSIS — L821 Other seborrheic keratosis: Secondary | ICD-10-CM | POA: Diagnosis not present

## 2021-12-30 DIAGNOSIS — L820 Inflamed seborrheic keratosis: Secondary | ICD-10-CM | POA: Diagnosis not present

## 2022-01-08 ENCOUNTER — Encounter (INDEPENDENT_AMBULATORY_CARE_PROVIDER_SITE_OTHER): Payer: Self-pay

## 2022-01-08 DIAGNOSIS — H524 Presbyopia: Secondary | ICD-10-CM | POA: Diagnosis not present

## 2022-01-08 DIAGNOSIS — H02433 Paralytic ptosis of bilateral eyelids: Secondary | ICD-10-CM | POA: Diagnosis not present

## 2022-01-08 DIAGNOSIS — H26491 Other secondary cataract, right eye: Secondary | ICD-10-CM | POA: Diagnosis not present

## 2022-01-08 DIAGNOSIS — H34231 Retinal artery branch occlusion, right eye: Secondary | ICD-10-CM | POA: Diagnosis not present

## 2022-01-08 DIAGNOSIS — H401131 Primary open-angle glaucoma, bilateral, mild stage: Secondary | ICD-10-CM | POA: Diagnosis not present

## 2022-01-08 LAB — HM DIABETES EYE EXAM

## 2022-01-12 DIAGNOSIS — N1832 Chronic kidney disease, stage 3b: Secondary | ICD-10-CM | POA: Diagnosis not present

## 2022-01-12 DIAGNOSIS — K219 Gastro-esophageal reflux disease without esophagitis: Secondary | ICD-10-CM | POA: Diagnosis not present

## 2022-01-12 DIAGNOSIS — E1151 Type 2 diabetes mellitus with diabetic peripheral angiopathy without gangrene: Secondary | ICD-10-CM | POA: Diagnosis not present

## 2022-01-12 DIAGNOSIS — J439 Emphysema, unspecified: Secondary | ICD-10-CM | POA: Diagnosis not present

## 2022-01-12 DIAGNOSIS — I509 Heart failure, unspecified: Secondary | ICD-10-CM | POA: Diagnosis not present

## 2022-01-12 DIAGNOSIS — E0591 Thyrotoxicosis, unspecified with thyrotoxic crisis or storm: Secondary | ICD-10-CM | POA: Diagnosis not present

## 2022-01-12 DIAGNOSIS — I5042 Chronic combined systolic (congestive) and diastolic (congestive) heart failure: Secondary | ICD-10-CM | POA: Diagnosis not present

## 2022-01-12 DIAGNOSIS — E782 Mixed hyperlipidemia: Secondary | ICD-10-CM | POA: Diagnosis not present

## 2022-01-12 DIAGNOSIS — I4891 Unspecified atrial fibrillation: Secondary | ICD-10-CM | POA: Diagnosis not present

## 2022-01-12 DIAGNOSIS — Z7984 Long term (current) use of oral hypoglycemic drugs: Secondary | ICD-10-CM | POA: Diagnosis not present

## 2022-01-12 DIAGNOSIS — D649 Anemia, unspecified: Secondary | ICD-10-CM | POA: Diagnosis not present

## 2022-01-12 DIAGNOSIS — I10 Essential (primary) hypertension: Secondary | ICD-10-CM | POA: Diagnosis not present

## 2022-01-23 DIAGNOSIS — N1832 Chronic kidney disease, stage 3b: Secondary | ICD-10-CM | POA: Diagnosis not present

## 2022-02-23 DIAGNOSIS — N1832 Chronic kidney disease, stage 3b: Secondary | ICD-10-CM | POA: Diagnosis not present

## 2022-02-26 DIAGNOSIS — I10 Essential (primary) hypertension: Secondary | ICD-10-CM | POA: Diagnosis not present

## 2022-02-26 DIAGNOSIS — I5042 Chronic combined systolic (congestive) and diastolic (congestive) heart failure: Secondary | ICD-10-CM | POA: Diagnosis not present

## 2022-02-26 DIAGNOSIS — E782 Mixed hyperlipidemia: Secondary | ICD-10-CM | POA: Diagnosis not present

## 2022-02-26 DIAGNOSIS — I4891 Unspecified atrial fibrillation: Secondary | ICD-10-CM | POA: Diagnosis not present

## 2022-02-26 DIAGNOSIS — E1151 Type 2 diabetes mellitus with diabetic peripheral angiopathy without gangrene: Secondary | ICD-10-CM | POA: Diagnosis not present

## 2022-03-25 DIAGNOSIS — H26491 Other secondary cataract, right eye: Secondary | ICD-10-CM | POA: Diagnosis not present

## 2022-05-04 DIAGNOSIS — L570 Actinic keratosis: Secondary | ICD-10-CM | POA: Diagnosis not present

## 2022-05-04 DIAGNOSIS — C44329 Squamous cell carcinoma of skin of other parts of face: Secondary | ICD-10-CM | POA: Diagnosis not present

## 2022-05-18 ENCOUNTER — Other Ambulatory Visit: Payer: Self-pay | Admitting: Cardiovascular Disease

## 2022-05-18 NOTE — Telephone Encounter (Signed)
Prescription refill request for Eliquis received. Indication: A Fib/Flutter Last office visit: 09/24/21  [name] MD Scr: 1.24 on 10/27/21 Age: 87 Weight: 67.4kg  Pt is on low dose Eliquis due to age and fluctuating SCr.  Will continue current dose.

## 2022-05-21 DIAGNOSIS — H26491 Other secondary cataract, right eye: Secondary | ICD-10-CM | POA: Diagnosis not present

## 2022-05-21 DIAGNOSIS — Z961 Presence of intraocular lens: Secondary | ICD-10-CM | POA: Diagnosis not present

## 2022-05-21 DIAGNOSIS — H401131 Primary open-angle glaucoma, bilateral, mild stage: Secondary | ICD-10-CM | POA: Diagnosis not present

## 2022-08-13 ENCOUNTER — Other Ambulatory Visit: Payer: Self-pay | Admitting: Cardiovascular Disease

## 2022-08-13 DIAGNOSIS — H401131 Primary open-angle glaucoma, bilateral, mild stage: Secondary | ICD-10-CM | POA: Diagnosis not present

## 2022-08-13 DIAGNOSIS — I484 Atypical atrial flutter: Secondary | ICD-10-CM

## 2022-08-13 MED ORDER — APIXABAN 2.5 MG PO TABS: 2.5000 mg | ORAL_TABLET | Freq: Two times a day (BID) | ORAL | 5 refills | Status: DC

## 2022-08-13 NOTE — Addendum Note (Signed)
Addended by: Leonidas Romberg on: 08/13/2022 01:49 PM   Modules accepted: Orders

## 2022-08-13 NOTE — Telephone Encounter (Addendum)
Prescription refill request for Eliquis received. Indication: Aflutter Last office visit: 09/24/21 (Croitoru)  Scr: 2.03 (02/23/22)  Age: 88 Weight: 67.4kg  Appropriate dose and refill sent to requested pharmacy.

## 2022-08-14 MED ORDER — APIXABAN 2.5 MG PO TABS: 2.5000 mg | ORAL_TABLET | Freq: Two times a day (BID) | ORAL | 5 refills | Status: DC

## 2022-08-14 NOTE — Addendum Note (Signed)
Addended by: Derrel Nip B on: 08/14/2022 04:43 PM   Modules accepted: Orders

## 2022-08-14 NOTE — Telephone Encounter (Addendum)
Eliquis refill request failed on yesterday and failed again today.   [place] and they are not open on Fridays. Left callback number to give a verbal order for pt's RX.   Also, since the Eliquis failed multiple times, printed out a copy and faxed to the number on file.   Aflutter, Last OV Dr. Sallyanne Kuster on 09/24/2021, Crea-2.03 on 02/23/2022, 88 yrs old

## 2022-08-18 NOTE — Telephone Encounter (Signed)
Aware of the refill failure; please see 08/14/22 telephone encounter regarding rx for details.

## 2022-08-19 DIAGNOSIS — J439 Emphysema, unspecified: Secondary | ICD-10-CM | POA: Diagnosis not present

## 2022-08-19 DIAGNOSIS — Z03818 Encounter for observation for suspected exposure to other biological agents ruled out: Secondary | ICD-10-CM | POA: Diagnosis not present

## 2022-08-19 DIAGNOSIS — J069 Acute upper respiratory infection, unspecified: Secondary | ICD-10-CM | POA: Diagnosis not present

## 2022-08-31 ENCOUNTER — Other Ambulatory Visit: Payer: Self-pay | Admitting: Cardiovascular Disease

## 2022-09-04 DIAGNOSIS — C44329 Squamous cell carcinoma of skin of other parts of face: Secondary | ICD-10-CM | POA: Diagnosis not present

## 2022-09-04 DIAGNOSIS — D044 Carcinoma in situ of skin of scalp and neck: Secondary | ICD-10-CM | POA: Diagnosis not present

## 2022-09-04 DIAGNOSIS — C4442 Squamous cell carcinoma of skin of scalp and neck: Secondary | ICD-10-CM | POA: Diagnosis not present

## 2022-09-04 DIAGNOSIS — L570 Actinic keratosis: Secondary | ICD-10-CM | POA: Diagnosis not present

## 2022-09-08 DIAGNOSIS — I5042 Chronic combined systolic (congestive) and diastolic (congestive) heart failure: Secondary | ICD-10-CM | POA: Diagnosis not present

## 2022-09-08 DIAGNOSIS — I10 Essential (primary) hypertension: Secondary | ICD-10-CM | POA: Diagnosis not present

## 2022-09-08 DIAGNOSIS — E782 Mixed hyperlipidemia: Secondary | ICD-10-CM | POA: Diagnosis not present

## 2022-09-08 DIAGNOSIS — E11319 Type 2 diabetes mellitus with unspecified diabetic retinopathy without macular edema: Secondary | ICD-10-CM | POA: Diagnosis not present

## 2022-10-05 DIAGNOSIS — E1122 Type 2 diabetes mellitus with diabetic chronic kidney disease: Secondary | ICD-10-CM | POA: Diagnosis not present

## 2022-10-05 DIAGNOSIS — E1169 Type 2 diabetes mellitus with other specified complication: Secondary | ICD-10-CM | POA: Diagnosis not present

## 2022-10-05 DIAGNOSIS — E1151 Type 2 diabetes mellitus with diabetic peripheral angiopathy without gangrene: Secondary | ICD-10-CM | POA: Diagnosis not present

## 2022-10-05 DIAGNOSIS — E782 Mixed hyperlipidemia: Secondary | ICD-10-CM | POA: Diagnosis not present

## 2022-10-05 DIAGNOSIS — Z23 Encounter for immunization: Secondary | ICD-10-CM | POA: Diagnosis not present

## 2022-10-05 DIAGNOSIS — D649 Anemia, unspecified: Secondary | ICD-10-CM | POA: Diagnosis not present

## 2022-10-05 DIAGNOSIS — Z0000 Encounter for general adult medical examination without abnormal findings: Secondary | ICD-10-CM | POA: Diagnosis not present

## 2022-10-05 DIAGNOSIS — N1832 Chronic kidney disease, stage 3b: Secondary | ICD-10-CM | POA: Diagnosis not present

## 2022-10-05 DIAGNOSIS — D6869 Other thrombophilia: Secondary | ICD-10-CM | POA: Diagnosis not present

## 2022-10-05 DIAGNOSIS — I4891 Unspecified atrial fibrillation: Secondary | ICD-10-CM | POA: Diagnosis not present

## 2022-10-05 DIAGNOSIS — E0591 Thyrotoxicosis, unspecified with thyrotoxic crisis or storm: Secondary | ICD-10-CM | POA: Diagnosis not present

## 2022-10-05 DIAGNOSIS — J439 Emphysema, unspecified: Secondary | ICD-10-CM | POA: Diagnosis not present

## 2022-10-05 DIAGNOSIS — I5042 Chronic combined systolic (congestive) and diastolic (congestive) heart failure: Secondary | ICD-10-CM | POA: Diagnosis not present

## 2022-10-21 ENCOUNTER — Encounter (INDEPENDENT_AMBULATORY_CARE_PROVIDER_SITE_OTHER): Payer: Medicare Other | Admitting: Ophthalmology

## 2022-11-24 ENCOUNTER — Other Ambulatory Visit: Payer: Self-pay | Admitting: Cardiovascular Disease

## 2022-12-07 ENCOUNTER — Other Ambulatory Visit: Payer: Self-pay | Admitting: Cardiovascular Disease

## 2023-01-07 ENCOUNTER — Other Ambulatory Visit: Payer: Self-pay | Admitting: Cardiovascular Disease

## 2023-01-12 DIAGNOSIS — C44329 Squamous cell carcinoma of skin of other parts of face: Secondary | ICD-10-CM | POA: Diagnosis not present

## 2023-01-12 DIAGNOSIS — L820 Inflamed seborrheic keratosis: Secondary | ICD-10-CM | POA: Diagnosis not present

## 2023-01-12 DIAGNOSIS — L570 Actinic keratosis: Secondary | ICD-10-CM | POA: Diagnosis not present

## 2023-01-19 DIAGNOSIS — E119 Type 2 diabetes mellitus without complications: Secondary | ICD-10-CM | POA: Diagnosis not present

## 2023-01-19 DIAGNOSIS — H401131 Primary open-angle glaucoma, bilateral, mild stage: Secondary | ICD-10-CM | POA: Diagnosis not present

## 2023-01-19 DIAGNOSIS — H34231 Retinal artery branch occlusion, right eye: Secondary | ICD-10-CM | POA: Diagnosis not present

## 2023-01-19 DIAGNOSIS — H04123 Dry eye syndrome of bilateral lacrimal glands: Secondary | ICD-10-CM | POA: Diagnosis not present

## 2023-03-12 ENCOUNTER — Encounter: Payer: Self-pay | Admitting: Cardiovascular Disease

## 2023-03-12 ENCOUNTER — Ambulatory Visit: Payer: Medicare Other | Attending: Cardiovascular Disease | Admitting: Cardiovascular Disease

## 2023-03-12 VITALS — BP 120/63 | HR 81 | Ht 66.0 in | Wt 152.2 lb

## 2023-03-12 DIAGNOSIS — I10 Essential (primary) hypertension: Secondary | ICD-10-CM | POA: Diagnosis not present

## 2023-03-12 DIAGNOSIS — I2510 Atherosclerotic heart disease of native coronary artery without angina pectoris: Secondary | ICD-10-CM

## 2023-03-12 DIAGNOSIS — I771 Stricture of artery: Secondary | ICD-10-CM | POA: Diagnosis not present

## 2023-03-12 DIAGNOSIS — I5042 Chronic combined systolic (congestive) and diastolic (congestive) heart failure: Secondary | ICD-10-CM

## 2023-03-12 DIAGNOSIS — E785 Hyperlipidemia, unspecified: Secondary | ICD-10-CM | POA: Diagnosis not present

## 2023-03-12 DIAGNOSIS — D6869 Other thrombophilia: Secondary | ICD-10-CM

## 2023-03-12 DIAGNOSIS — N1832 Chronic kidney disease, stage 3b: Secondary | ICD-10-CM | POA: Diagnosis not present

## 2023-03-12 DIAGNOSIS — J432 Centrilobular emphysema: Secondary | ICD-10-CM | POA: Diagnosis not present

## 2023-03-12 DIAGNOSIS — I739 Peripheral vascular disease, unspecified: Secondary | ICD-10-CM

## 2023-03-12 DIAGNOSIS — I701 Atherosclerosis of renal artery: Secondary | ICD-10-CM

## 2023-03-12 DIAGNOSIS — I484 Atypical atrial flutter: Secondary | ICD-10-CM | POA: Diagnosis not present

## 2023-03-12 DIAGNOSIS — E0590 Thyrotoxicosis, unspecified without thyrotoxic crisis or storm: Secondary | ICD-10-CM | POA: Diagnosis not present

## 2023-03-12 DIAGNOSIS — D649 Anemia, unspecified: Secondary | ICD-10-CM | POA: Diagnosis not present

## 2023-03-12 NOTE — Progress Notes (Signed)
Patient ID: Shaun Mclaughlin, male   DOB: September 22, 1934, 88 y.o.   MRN: [id number]    Cardiology Office Note    Date:  03/12/2023   ID:  Shaun Mclaughlin, DOB 1934-09-02, MRN [id number]  PCP:  Tally Joe, MD  Cardiologist:   Thurmon Fair, MD   Chief Complaint  Patient presents with   Atrial Fibrillation    History of Present Illness:  Shaun Mclaughlin is a 88 y.o. male with a long-standing history of atrial arrhythmia (persistent atrial fibrillation and atrial flutter requiring cardioversion in the past), now probably permanent atrial fibrillation, peripheral arterial disease, coronary artery disease (stent RCA 2003), PAD (last revascularization procedure left subclavian stent 2014), COPD, controlled type 2 diabetes mellitus and hypertension.  About 2 years ago, Shaun Mclaughlin was forced to retire, although he still enjoyed going to work 30 hours a week.  He is planning to move to [facility] with his wife.  Unfortunately this means that he will have to give up his 47-year-old Weimaraner.  One of his friends is not taking care of the dog and brings him over to visit at times.  Shaun Mclaughlin walks around the property a lot and occasionally plays corn hole, generally stays active.  He has unchanged NYHA functional class II exertional dyspnea.  He uses the inhaler a few times a week with relief.  He denies orthopnea, PND or lower extremity edema and has not had any problems with chest pain.  He has very easy bruising.  He is on the lower dose of Eliquis 2.5 mg twice daily.  He has not had serious bleeding or falls.  He has not had any new neurological complaints.  He does not have any symptoms of claudication in either upper or lower extremities.  Unaware of the arrhythmia.  Does not have palpitations.  Today he is in atrial fibrillation, many times low presents with irregular rhythm due to atrial flutter with 4: 1 AV block.  Metabolic control is good.  Most recent LDL cholesterol was 59 and hemoglobin A1c  was 6.7%.  He has a chronically low HDL at 31.  Renal function has deteriorated a little bit.  Most recent creatinine was 1.87 and potassium was borderline high at 5.6.  His blood pressure is roughly 10 mmHg higher in his right upper extremity.  He has had a number of minor skin surgeries for cancer.  He does not have a history of stroke or TIA. He has coronary artery disease and previously underwent placement of a stent to the right coronary artery in 2003, coronaries patent by cardiac catheterization in 2013. He has hypertension, diabetes mellitus and hyperlipidemia all of which are well compensated. He has peripheral arterial disease and received a stent to the distal left subclavian artery in January 2014 for left arm claudication (10 x 40 mm Cordis Smart nitinol). He is also known to have a 50-60% left renal artery stenosis. Echocardiogram and nuclear stress testing have shown reduction in LV systolic function (43% by scintigraphy, 45-50 % by transthoracic echocardiography, 35% by transesophageal echo at the time of his last cardioversion). Nuclear stress testing shows an inferior wall scar without reversible ischemia (Nov 2013).     Past Medical History:  Diagnosis Date   Acute on chronic combined systolic and diastolic congestive heart failure, NYHA class 2 (HCC) 06/29/2014   Arthritis    "maybe a little bit in some joints" (08/04/2012)   Atherosclerotic renal artery stenosis, unilateral (HCC)    lleft renal  artery stenosis by duplex ultrasound   CAD (coronary artery disease),Hx RCA stenting-patent 06/2012  08/05/2012   Chronic anticoagulation, coumadin for PAF 08/05/2012   Chronic bronchitis (HCC)    "about q yr" (08/04/2012)   DM (diabetes mellitus) (HCC) 08/05/2012   External bleeding hemorrhoids ~ 2003; 2008; 2013   "removed polyps and no bleeding since" (08/04/2012)   GERD (gastroesophageal reflux disease)    Hip fracture (HCC) 10/2016   RIGHT FEMORAL NECK    HTN (hypertension) 08/05/2012    Hypercholesteremia    Hyperlipidemia 08/05/2012   Hyperthyroidism    Kidney stone 1980's   PAF (paroxysmal atrial fibrillation), maintaining SR 08/05/2012   Pneumonia ~ 2008   PVD (peripheral vascular disease) with claudication, Lt Upper ext. pain, and known 80-90%distal Lt. subclavian artery stenosis  08/05/2012   S/P angioplasty with stent to Lt. subclavian artery -Nitrinol stent 08/04/12 08/05/2012   Type II diabetes mellitus (HCC)    "take RX; I'm prediabetic; Shaun Mclaughlin't have to  check my CBG qd; keep my weight down" (08/04/2012)    Past Surgical History:  Procedure Laterality Date   CARDIAC CATHETERIZATION  2008 and Dec 2013   patent coronaries   CARDIOVERSION N/A 07/13/2014   Procedure: CARDIOVERSION;  Surgeon: Thurmon Fair, MD;  Location: [facility];  Service: Cardiovascular;  Laterality: N/A;   CARDIOVERSION N/A 09/28/2014   Procedure: CARDIOVERSION;  Surgeon: Lars Masson, MD;  Location: [facility];  Service: Cardiovascular;  Laterality: N/A;   CATARACT EXTRACTION W/ INTRAOCULAR LENS IMPLANT     "right eye" (08/04/2012)   CORONARY ANGIOPLASTY WITH STENT PLACEMENT  2003   patent 12/13   GLAUCOMA SURGERY  2000's   "right eye; had laser procedure  to lower the pressure and prevent glaucoma" (08/04/2012)   HERNIA REPAIR  2000's   "umbilical" (08/04/2012)   INGUINAL HERNIA REPAIR  2000's   "right" (08/04/2012)   KIDNEY STONE SURGERY  1980's   LEFT HEART CATHETERIZATION WITH CORONARY ANGIOGRAM N/A 06/28/2012   Procedure: LEFT HEART CATHETERIZATION WITH CORONARY ANGIOGRAM;  Surgeon: [name], MD;  Location: [facility] CATH LAB;  Service: Cardiovascular;  Laterality: N/A;   LITHOTRIPSY  1980's   "imploded in my kidney; ended up having to be cut open in my back" (08/04/2012)   SKIN CANCER EXCISION  2017 & 2018   SUBCLAVIAN STENT PLACEMENT  Jan 2014   Lt SCA   TONSILLECTOMY  1940's   TOTAL HIP ARTHROPLASTY Right 10/27/2016   Procedure: TOTAL HIP ARTHROPLASTY ANTERIOR APPROACH;  Surgeon:  Samson Frederic, MD;  Location: [facility] OR;  Service: Orthopedics;  Laterality: Right;   UNILATERAL UPPER EXTREMEITY ANGIOGRAM N/A 08/04/2012   Procedure: UNILATERAL UPPER EXTREMEITY ANGIOGRAM;  Surgeon: [name], MD;  Location[facility] CATH LAB;  Service: Cardiovascular;  Laterality: N/A;   UPPER EXTREMITY ANGIOGRAM Bilateral 06/28/2012   Procedure: UPPER EXTREMITY ANGIOGRAM;  Surgeon: [name], MD;  Location: [facility] CATH LAB;  Service: Cardiovascular;  Laterality: Bilateral;    Current Medications: Outpatient Medications Prior to Visit  Medication Sig Dispense Refill   albuterol (PROVENTIL HFA;VENTOLIN HFA) 108 (90 Base) MCG/ACT inhaler Inhale 2 puffs into the lungs every 6 (six) hours as needed for wheezing or shortness of breath. 1 Inhaler 5   apixaban (ELIQUIS) 2.5 MG TABS tablet Take 1 tablet (2.5 mg total) by mouth 2 (two) times daily. 60 tablet 5   Ascorbic Acid (VITAMIN C WITH ROSE HIPS) 1000 MG tablet Take 1,000 mg by mouth daily.     atorvastatin (LIPITOR)  40 MG tablet Take 1 tablet (40 mg total) by mouth daily. 30 tablet 11   furosemide (LASIX) 80 MG tablet TAKE 1 TABLET BY MOUTH ONCE DAILY 90 tablet 1   KRILL OIL PO Take 1 capsule by mouth daily.      latanoprost (XALATAN) 0.005 % ophthalmic solution 1 drop every morning.     metFORMIN (GLUCOPHAGE-XR) 500 MG 24 hr tablet Take 500 mg by mouth daily with breakfast.     metoprolol tartrate (LOPRESSOR) 25 MG tablet TAKE 1 TABLET BY MOUTH TWICE DAILY 180 tablet 3   Multiple Vitamin (MULTIVITAMIN WITH MINERALS) TABS Take 1 tablet by mouth daily.     naproxen sodium (ANAPROX) 220 MG tablet Take 220 mg by mouth 2 (two) times daily as needed (PAIN).     omeprazole (PRILOSEC) 20 MG capsule Take 20 mg by mouth daily as needed (heartburn).      propylthiouracil (PTU) 50 MG tablet Take 50 mg by mouth daily.     triamcinolone (NASACORT) 55 MCG/ACT AERO nasal inhaler Place 2 sprays into the nose daily as needed (ALLERGIES).     trolamine  salicylate (ASPERCREME) 10 % cream Apply 1 application topically as needed for muscle pain.     Hypromellose 0.2 % SOLN Apply 2 drops to eye as needed.  (Patient not taking: Reported on 03/12/2023)     losartan (COZAAR) 50 MG tablet TAKE 1 TABLET BY MOUTH ONCE DAILY. CALL DR TO MAKE APPT FOR REFILLS (Patient not taking: Reported on 03/12/2023) 30 tablet 1   No facility-administered medications prior to visit.     Allergies:   Patient has no known allergies.   Social History   Socioeconomic History   Marital status: Married    Spouse name: Not on file   Number of children: Not on file   Years of education: Not on file   Highest education level: Not on file  Occupational History   Not on file  Tobacco Use   Smoking status: Every Day    Current packs/day: 0.50    Average packs/day: 0.5 packs/day for 25.0 years (12.5 ttl pk-yrs)    Types: Cigarettes, Pipe   Smokeless tobacco: Never   Tobacco comments:    08/04/2012 "still smoke a cigar q once in awhile"  Substance and Sexual Activity   Alcohol use: Yes    Comment: OCCASIONAL   Drug use: No   Sexual activity: Never  Other Topics Concern   Not on file  Social History Narrative   Not on file   Social Determinants of Health   Financial Resource Strain: Not on file  Food Insecurity: Not on file  Transportation Needs: Not on file  Physical Activity: Not on file  Stress: Not on file  Social Connections: Not on file     Family History:  The patient's family history includes Coronary artery disease (age of onset: 68) in his father; Hypertension in his mother; Stroke (age of onset: 18) in his mother.   ROS:   Please see the history of present illness.    ROS All other systems are reviewed and are negative.   PHYSICAL EXAM:   VS:  BP 120/63 (BP Location: Right Arm)   Pulse 81   Ht 5\' 6"  (1.676 m)   Wt 152 lb 3.2 oz (69 kg)   BMI 24.57 kg/m    The blood pressure in his right upper extremity was roughly 10 mmHg higher than on  the left    General: Alert, oriented  x3, no distress Head: no evidence of trauma, PERRL, EOMI, no exophtalmos or lid lag, no myxedema, no xanthelasma; normal ears, nose and oropharynx Neck: normal jugular venous pulsations and no hepatojugular reflux; brisk carotid pulses without delay and no carotid bruits Chest: clear to auscultation, no signs of consolidation by percussion or palpation, normal fremitus, symmetrical and full respiratory excursions Cardiovascular: normal position and quality of the apical impulse, irregular rhythm, normal first and second heart sounds, no murmurs, rubs or gallops Abdomen: no tenderness or distention, no masses by palpation, no abnormal pulsatility or arterial bruits, normal bowel sounds, no hepatosplenomegaly Extremities: no clubbing, cyanosis or edema; 2+ radial, ulnar and brachial pulses bilaterally, but by simultaneous palpation of the left radial pulses just slightly weaker; 2+ right femoral, posterior tibial and dorsalis pedis pulses; 2+ left femoral, posterior tibial and dorsalis pedis pulses; no subclavian or femoral bruits Neurological: grossly nonfocal Psych: Normal mood and affect   Wt Readings from Last 3 Encounters:  03/12/23 152 lb 3.2 oz (69 kg)  09/24/21 148 lb 9.6 oz (67.4 kg)  08/09/20 150 lb 9.6 oz (68.3 kg)      Studies/Labs Reviewed:   EKG:  EKG is ordered today.  It shows atrial fibrillation, old bifascicular block (RBBB plus LAFB) with tiny Q waves in the lateral leads (1 aVL V5 V6) LABS: February 2017 hemoglobin A1c 6.6%, cholesterol 105, triglycerides 85, HDL 25, LDL 64 August 2017, hemoglobin A1c 6.3%, cholesterol 97, triglycerides 73, HDL 25, LDL 57, creatinine 1.27 March 2017 hemoglobin A1c 7.3%, cholesterol 102, triglycerides 50, HDL 24, LDL 67, creatinine 0.93, TSH 1.26, potassium 4.31 March 2018 hemoglobin A1c 6.9%, cholesterol 106, HDL 28, LDL 65, triglycerides 69, creatinine 1.14, potassium 4.7  May 12, 2019 Total cholesterol 91, HDL 25, LDL 51, triglycerides 76 Hemoglobin A1c 6.8%, hemoglobin 12, creatinine 1.76, potassium 5.2, normal liver function tests and TSH  May 31, 2020 Total cholesterol 98, LDL 52, HDL 34, triglycerides 52 Hemoglobin A1c 6.6%, creatinine 1.11, TSH 0.99  07/04/2021 Cholesterol 119, HDL 33, LDL 67, triglycerides 105 Hemoglobin A1c 6.7%, hemoglobin 10.7, creatinine 1.58, ALT 10.0, TSH 1.95  10/05/2022 Cholesterol 110, HDL 31, LDL 59, triglycerides 108 Hemoglobin A1c 6.7%, hemoglobin 11.3, ALT 12, TSH 1.48  10/26/2022 Creatinine 1.87, potassium 5.6  ASSESSMENT:    1. Atypical atrial flutter (HCC)   2. Chronic combined systolic and diastolic congestive heart failure, NYHA class 2 (HCC)   3. Essential hypertension   4. Coronary artery disease involving native coronary artery of native heart without angina pectoris   5. PAD (peripheral artery disease) (HCC)   6. Stenosis of left subclavian artery (HCC)   7. Dyslipidemia (high LDL; low HDL)   8. Acquired thrombophilia (HCC)   9. Centrilobular emphysema (HCC)   10. Hyperthyroidism   11. Anemia, unspecified type   12. Stage 3b chronic kidney disease (HCC)   13. Left renal artery stenosis (HCC)      PLAN:  In order of problems listed above:  1. AFib/atypical atrial flutter: Often organized and regular, as it is today which gives a false impression that he may be in sinus rhythm.  His rate is well-controlled and a recently performed rhythm monitor showed no evidence of high-grade AV block or severe bradycardia.  Note that prior to onset of atrial fibrillation he had a lot of issues with bradycardia and has bifascicular block.  Possible need for pacemaker in the future. CHADSVasc 6 (age 72, vascular disease, HTN, DM, CHF). 2. CHF: Clinically  euvolemic, has not required any recent adjustments in diuretic dose.  Stable NYHA functional class II dyspnea primarily related to COPD.  Losartan has been discontinued, I  presume due to the hyperkalemia in April. 3. HTN: Well-controlled.  Note that the blood pressure in his right upper extremity is higher than on the left.  He may have some small degree of left subclavian stenosis (that vessel has been previously stented).  Check blood pressure only on the right. 4. CAD: Thematic, statin and beta-blocker, not on aspirin due to full anticoagulation. 5. PAD: Previously had left upper extremity claudication which resolved after placement of a subclavian stent.  There is a minor difference in blood pressure between the right and left upper extremity.  Repeat evaluation does not appear to be necessary at this time.  Also noted to have left renal artery stenosis. 6. HLP: Excellent LDL on statin which we will continue.  He is always had a low HDL even though he is quite lean and has decent glycemic control. 7. DM: Adequate control on metformin monotherapy. 8.  Eliquis: Other than easy bruising he does not have any bleeding issues.  The dose is adjusted for advanced age and elevated creatinine.  9. COPD: Seems to be stable.  He quit smoking many years ago.  Tolerating beta-blocker without obvious effect on his wheezing. 10.  Hyperthyroidism: Well compensated on PTU.  Recent TSH 1.48. 11. Anemia: Mild.  Appears to be stable. 12. CKD3B-4: Creatinine appears a little worse than previous baseline.  Hopefully this is a transient worsening.  Avoid NSAIDs (he is currently only taking acetaminophen).  Avoid intravenous contrast unless absolutely necessary. 13.  L renal artery stenosis: Evaluated by Dr. Allyson Sabal in 2016.  Intervention was not recommended.  Blood pressure is very well-controlled.  Questional benefit from invasive evaluation, may do more harm than good.   Medication Adjustments/Labs and Tests Ordered: Current medicines are reviewed at length with the patient today.  Concerns regarding medicines are outlined above.  Medication changes, Labs and Tests ordered today are listed  in the Patient Instructions below. Patient Instructions  Medication Instructions:  No changes *If you need a refill on your cardiac medications before your next appointment, please call your pharmacy*  Follow-Up: At [facility], you and your health needs are our priority.  As part of our continuing mission to provide you with exceptional heart care, we have created designated Provider Care Teams.  These Care Teams include your primary Cardiologist (physician) and Advanced Practice Providers (APPs -  Physician Assistants and Nurse Practitioners) who all work together to provide you with the care you need, when you need it.  We recommend signing up for the patient portal called "MyChart".  Sign up information is provided on this After Visit Summary.  MyChart is used to connect with patients for Virtual Visits (Telemedicine).  Patients are able to view lab/test results, encounter notes, upcoming appointments, etc.  Non-urgent messages can be sent to your provider as well.   To learn more about what you can do with MyChart, go to [email or web address].    Your next appointment:   1 year(s)  Provider:   Thurmon Fair, MD       Signed, Thurmon Fair, MD  03/12/2023 11:07 AM    [facility] [address] [place], [place], [place]  [address] Phone: [phone]; Fax: [phone]

## 2023-03-12 NOTE — Patient Instructions (Signed)

## 2023-04-13 DIAGNOSIS — N1832 Chronic kidney disease, stage 3b: Secondary | ICD-10-CM | POA: Diagnosis not present

## 2023-04-13 DIAGNOSIS — D649 Anemia, unspecified: Secondary | ICD-10-CM | POA: Diagnosis not present

## 2023-04-13 DIAGNOSIS — D6869 Other thrombophilia: Secondary | ICD-10-CM | POA: Diagnosis not present

## 2023-04-13 DIAGNOSIS — I5042 Chronic combined systolic (congestive) and diastolic (congestive) heart failure: Secondary | ICD-10-CM | POA: Diagnosis not present

## 2023-04-13 DIAGNOSIS — E782 Mixed hyperlipidemia: Secondary | ICD-10-CM | POA: Diagnosis not present

## 2023-04-13 DIAGNOSIS — M25552 Pain in left hip: Secondary | ICD-10-CM | POA: Diagnosis not present

## 2023-04-13 DIAGNOSIS — E1151 Type 2 diabetes mellitus with diabetic peripheral angiopathy without gangrene: Secondary | ICD-10-CM | POA: Diagnosis not present

## 2023-04-13 DIAGNOSIS — E0591 Thyrotoxicosis, unspecified with thyrotoxic crisis or storm: Secondary | ICD-10-CM | POA: Diagnosis not present

## 2023-04-13 DIAGNOSIS — Z23 Encounter for immunization: Secondary | ICD-10-CM | POA: Diagnosis not present

## 2023-04-13 DIAGNOSIS — I4891 Unspecified atrial fibrillation: Secondary | ICD-10-CM | POA: Diagnosis not present

## 2023-04-13 DIAGNOSIS — K219 Gastro-esophageal reflux disease without esophagitis: Secondary | ICD-10-CM | POA: Diagnosis not present

## 2023-04-13 DIAGNOSIS — I130 Hypertensive heart and chronic kidney disease with heart failure and stage 1 through stage 4 chronic kidney disease, or unspecified chronic kidney disease: Secondary | ICD-10-CM | POA: Diagnosis not present

## 2023-04-15 ENCOUNTER — Inpatient Hospital Stay (HOSPITAL_COMMUNITY): Payer: Medicare Other

## 2023-04-15 ENCOUNTER — Encounter (HOSPITAL_COMMUNITY): Payer: Self-pay

## 2023-04-15 ENCOUNTER — Telehealth: Payer: Self-pay | Admitting: Cardiovascular Disease

## 2023-04-15 ENCOUNTER — Other Ambulatory Visit: Payer: Self-pay

## 2023-04-15 ENCOUNTER — Inpatient Hospital Stay (HOSPITAL_COMMUNITY)
Admission: EM | Admit: 2023-04-15 | Discharge: 2023-04-19 | DRG: 683 | Disposition: A | Discharge status: Home / Self Care | Payer: Medicare Other | Attending: Internal Medicine | Admitting: Internal Medicine

## 2023-04-15 ENCOUNTER — Emergency Department (HOSPITAL_COMMUNITY): Payer: Medicare Other

## 2023-04-15 DIAGNOSIS — W19XXXA Unspecified fall, initial encounter: Secondary | ICD-10-CM | POA: Diagnosis present

## 2023-04-15 DIAGNOSIS — Z8249 Family history of ischemic heart disease and other diseases of the circulatory system: Secondary | ICD-10-CM

## 2023-04-15 DIAGNOSIS — Z515 Encounter for palliative care: Secondary | ICD-10-CM | POA: Diagnosis not present

## 2023-04-15 DIAGNOSIS — J439 Emphysema, unspecified: Secondary | ICD-10-CM | POA: Diagnosis present

## 2023-04-15 DIAGNOSIS — I6782 Cerebral ischemia: Secondary | ICD-10-CM | POA: Diagnosis not present

## 2023-04-15 DIAGNOSIS — E1122 Type 2 diabetes mellitus with diabetic chronic kidney disease: Secondary | ICD-10-CM | POA: Diagnosis present

## 2023-04-15 DIAGNOSIS — N179 Acute kidney failure, unspecified: Principal | ICD-10-CM | POA: Diagnosis present

## 2023-04-15 DIAGNOSIS — F17210 Nicotine dependence, cigarettes, uncomplicated: Secondary | ICD-10-CM | POA: Diagnosis present

## 2023-04-15 DIAGNOSIS — J432 Centrilobular emphysema: Secondary | ICD-10-CM | POA: Diagnosis not present

## 2023-04-15 DIAGNOSIS — N1831 Chronic kidney disease, stage 3a: Secondary | ICD-10-CM | POA: Diagnosis not present

## 2023-04-15 DIAGNOSIS — E1151 Type 2 diabetes mellitus with diabetic peripheral angiopathy without gangrene: Secondary | ICD-10-CM | POA: Diagnosis not present

## 2023-04-15 DIAGNOSIS — T148XXA Other injury of unspecified body region, initial encounter: Secondary | ICD-10-CM | POA: Diagnosis not present

## 2023-04-15 DIAGNOSIS — I4891 Unspecified atrial fibrillation: Secondary | ICD-10-CM | POA: Diagnosis not present

## 2023-04-15 DIAGNOSIS — E042 Nontoxic multinodular goiter: Secondary | ICD-10-CM | POA: Diagnosis not present

## 2023-04-15 DIAGNOSIS — I130 Hypertensive heart and chronic kidney disease with heart failure and stage 1 through stage 4 chronic kidney disease, or unspecified chronic kidney disease: Secondary | ICD-10-CM | POA: Diagnosis not present

## 2023-04-15 DIAGNOSIS — E871 Hypo-osmolality and hyponatremia: Secondary | ICD-10-CM | POA: Diagnosis not present

## 2023-04-15 DIAGNOSIS — Z79899 Other long term (current) drug therapy: Secondary | ICD-10-CM | POA: Diagnosis not present

## 2023-04-15 DIAGNOSIS — Z823 Family history of stroke: Secondary | ICD-10-CM

## 2023-04-15 DIAGNOSIS — S51012A Laceration without foreign body of left elbow, initial encounter: Secondary | ICD-10-CM | POA: Diagnosis present

## 2023-04-15 DIAGNOSIS — I6381 Other cerebral infarction due to occlusion or stenosis of small artery: Secondary | ICD-10-CM | POA: Diagnosis not present

## 2023-04-15 DIAGNOSIS — I2510 Atherosclerotic heart disease of native coronary artery without angina pectoris: Secondary | ICD-10-CM | POA: Diagnosis not present

## 2023-04-15 DIAGNOSIS — E7800 Pure hypercholesterolemia, unspecified: Secondary | ICD-10-CM | POA: Diagnosis not present

## 2023-04-15 DIAGNOSIS — Z96641 Presence of right artificial hip joint: Secondary | ICD-10-CM | POA: Diagnosis not present

## 2023-04-15 DIAGNOSIS — Z7984 Long term (current) use of oral hypoglycemic drugs: Secondary | ICD-10-CM

## 2023-04-15 DIAGNOSIS — E875 Hyperkalemia: Principal | ICD-10-CM | POA: Diagnosis present

## 2023-04-15 DIAGNOSIS — R911 Solitary pulmonary nodule: Secondary | ICD-10-CM | POA: Diagnosis not present

## 2023-04-15 DIAGNOSIS — I5042 Chronic combined systolic (congestive) and diastolic (congestive) heart failure: Secondary | ICD-10-CM | POA: Diagnosis not present

## 2023-04-15 DIAGNOSIS — J9811 Atelectasis: Secondary | ICD-10-CM | POA: Diagnosis not present

## 2023-04-15 DIAGNOSIS — Z043 Encounter for examination and observation following other accident: Secondary | ICD-10-CM | POA: Diagnosis not present

## 2023-04-15 DIAGNOSIS — Z961 Presence of intraocular lens: Secondary | ICD-10-CM | POA: Diagnosis present

## 2023-04-15 DIAGNOSIS — I5032 Chronic diastolic (congestive) heart failure: Secondary | ICD-10-CM | POA: Diagnosis not present

## 2023-04-15 DIAGNOSIS — E860 Dehydration: Secondary | ICD-10-CM | POA: Diagnosis not present

## 2023-04-15 DIAGNOSIS — E0590 Thyrotoxicosis, unspecified without thyrotoxic crisis or storm: Secondary | ICD-10-CM | POA: Diagnosis not present

## 2023-04-15 DIAGNOSIS — F17290 Nicotine dependence, other tobacco product, uncomplicated: Secondary | ICD-10-CM | POA: Diagnosis present

## 2023-04-15 DIAGNOSIS — K219 Gastro-esophageal reflux disease without esophagitis: Secondary | ICD-10-CM | POA: Diagnosis present

## 2023-04-15 DIAGNOSIS — N200 Calculus of kidney: Secondary | ICD-10-CM | POA: Diagnosis not present

## 2023-04-15 DIAGNOSIS — E119 Type 2 diabetes mellitus without complications: Secondary | ICD-10-CM

## 2023-04-15 DIAGNOSIS — S299XXA Unspecified injury of thorax, initial encounter: Secondary | ICD-10-CM | POA: Diagnosis not present

## 2023-04-15 DIAGNOSIS — E039 Hypothyroidism, unspecified: Secondary | ICD-10-CM | POA: Diagnosis not present

## 2023-04-15 DIAGNOSIS — G44309 Post-traumatic headache, unspecified, not intractable: Secondary | ICD-10-CM | POA: Diagnosis not present

## 2023-04-15 DIAGNOSIS — J449 Chronic obstructive pulmonary disease, unspecified: Secondary | ICD-10-CM | POA: Diagnosis present

## 2023-04-15 DIAGNOSIS — Z66 Do not resuscitate: Secondary | ICD-10-CM | POA: Diagnosis not present

## 2023-04-15 DIAGNOSIS — I4821 Permanent atrial fibrillation: Secondary | ICD-10-CM | POA: Diagnosis present

## 2023-04-15 DIAGNOSIS — Z85828 Personal history of other malignant neoplasm of skin: Secondary | ICD-10-CM | POA: Diagnosis not present

## 2023-04-15 DIAGNOSIS — N281 Cyst of kidney, acquired: Secondary | ICD-10-CM | POA: Diagnosis not present

## 2023-04-15 DIAGNOSIS — Z7189 Other specified counseling: Secondary | ICD-10-CM | POA: Diagnosis not present

## 2023-04-15 DIAGNOSIS — Z955 Presence of coronary angioplasty implant and graft: Secondary | ICD-10-CM

## 2023-04-15 DIAGNOSIS — S20229A Contusion of unspecified back wall of thorax, initial encounter: Secondary | ICD-10-CM | POA: Diagnosis present

## 2023-04-15 DIAGNOSIS — E041 Nontoxic single thyroid nodule: Secondary | ICD-10-CM | POA: Diagnosis not present

## 2023-04-15 DIAGNOSIS — J90 Pleural effusion, not elsewhere classified: Secondary | ICD-10-CM | POA: Diagnosis not present

## 2023-04-15 DIAGNOSIS — Z9841 Cataract extraction status, right eye: Secondary | ICD-10-CM

## 2023-04-15 DIAGNOSIS — Z7901 Long term (current) use of anticoagulants: Secondary | ICD-10-CM

## 2023-04-15 LAB — CBG MONITORING, ED: Glucose-Capillary: 102 mg/dL — ABNORMAL HIGH (ref 70–99)

## 2023-04-15 LAB — CBC WITH DIFFERENTIAL/PLATELET
Abs Immature Granulocytes: 0 10*3/uL (ref 0.00–0.07)
Basophils Absolute: 0 10*3/uL (ref 0.0–0.1)
Basophils Relative: 0 %
Eosinophils Absolute: 0.3 10*3/uL (ref 0.0–0.5)
Eosinophils Relative: 4 %
HCT: 41.4 % (ref 39.0–52.0)
Hemoglobin: 13.6 g/dL (ref 13.0–17.0)
Lymphocytes Relative: 3 %
Lymphs Abs: 0.2 10*3/uL — ABNORMAL LOW (ref 0.7–4.0)
MCH: 29.1 pg (ref 26.0–34.0)
MCHC: 32.9 g/dL (ref 30.0–36.0)
MCV: 88.7 fL (ref 80.0–100.0)
Monocytes Absolute: 0.3 10*3/uL (ref 0.1–1.0)
Monocytes Relative: 4 %
Neutro Abs: 6.2 10*3/uL (ref 1.7–7.7)
Neutrophils Relative %: 89 %
Platelets: 263 10*3/uL (ref 150–400)
RBC: 4.67 MIL/uL (ref 4.22–5.81)
RDW: 18.5 % — ABNORMAL HIGH (ref 11.5–15.5)
WBC: 7 10*3/uL (ref 4.0–10.5)
nRBC: 0.3 % — ABNORMAL HIGH (ref 0.0–0.2)
nRBC: 1 /100 WBC — ABNORMAL HIGH

## 2023-04-15 LAB — URINALYSIS, COMPLETE (UACMP) WITH MICROSCOPIC
Bacteria, UA: NONE SEEN
Bilirubin Urine: NEGATIVE
Glucose, UA: NEGATIVE mg/dL
Hgb urine dipstick: NEGATIVE
Ketones, ur: 5 mg/dL — AB
Leukocytes,Ua: NEGATIVE
Nitrite: NEGATIVE
Protein, ur: NEGATIVE mg/dL
Specific Gravity, Urine: 1.01 (ref 1.005–1.030)
pH: 5 (ref 5.0–8.0)

## 2023-04-15 LAB — COMPREHENSIVE METABOLIC PANEL
ALT: 19 U/L (ref 0–44)
AST: 27 U/L (ref 15–41)
Albumin: 3.6 g/dL (ref 3.5–5.0)
Alkaline Phosphatase: 92 U/L (ref 38–126)
Anion gap: 18 — ABNORMAL HIGH (ref 5–15)
BUN: 65 mg/dL — ABNORMAL HIGH (ref 8–23)
CO2: 20 mmol/L — ABNORMAL LOW (ref 22–32)
Calcium: 7.3 mg/dL — ABNORMAL LOW (ref 8.9–10.3)
Chloride: 91 mmol/L — ABNORMAL LOW (ref 98–111)
Creatinine, Ser: 3.45 mg/dL — ABNORMAL HIGH (ref 0.61–1.24)
GFR, Estimated: 17 mL/min — ABNORMAL LOW (ref >60)
Glucose, Bld: 116 mg/dL — ABNORMAL HIGH (ref 70–99)
Potassium: 6.2 mmol/L — ABNORMAL HIGH (ref 3.5–5.1)
Sodium: 129 mmol/L — ABNORMAL LOW (ref 135–145)
Total Bilirubin: 1.3 mg/dL — ABNORMAL HIGH (ref 0.3–1.2)
Total Protein: 8.9 g/dL — ABNORMAL HIGH (ref 6.5–8.1)

## 2023-04-15 LAB — MAGNESIUM: Magnesium: 0.6 mg/dL — CL (ref 1.7–2.4)

## 2023-04-15 LAB — CREATININE, URINE, RANDOM: Creatinine, Urine: 85 mg/dL

## 2023-04-15 LAB — SODIUM, URINE, RANDOM: Sodium, Ur: 16 mmol/L

## 2023-04-15 MED ORDER — SODIUM CHLORIDE 0.9% FLUSH
3.0000 mL | Freq: Two times a day (BID) | INTRAVENOUS | Status: DC
  Administered 2023-04-15 – 2023-04-19 (×8): 3 mL via INTRAVENOUS

## 2023-04-15 MED ORDER — ACETAMINOPHEN 650 MG RE SUPP: 650.0000 mg | Freq: Four times a day (QID) | RECTAL | Status: DC | PRN

## 2023-04-15 MED ORDER — SENNOSIDES-DOCUSATE SODIUM 8.6-50 MG PO TABS: 1.0000 | ORAL_TABLET | Freq: Every evening | ORAL | Status: DC | PRN

## 2023-04-15 MED ORDER — MAGNESIUM SULFATE 2 GM/50ML IV SOLN
2.0000 g | Freq: Once | INTRAVENOUS | Status: AC
  Administered 2023-04-15: 2 g via INTRAVENOUS
  Filled 2023-04-15: qty 50

## 2023-04-15 MED ORDER — DEXTROSE 10 % IV SOLN: Freq: Once | INTRAVENOUS | Status: AC

## 2023-04-15 MED ORDER — SODIUM ZIRCONIUM CYCLOSILICATE 10 G PO PACK
10.0000 g | PACK | Freq: Once | ORAL | Status: AC
  Administered 2023-04-15: 10 g via ORAL
  Filled 2023-04-15: qty 1

## 2023-04-15 MED ORDER — ONDANSETRON HCL 4 MG/2ML IJ SOLN: 4.0000 mg | Freq: Four times a day (QID) | INTRAMUSCULAR | Status: DC | PRN

## 2023-04-15 MED ORDER — METOPROLOL TARTRATE 25 MG PO TABS
25.0000 mg | ORAL_TABLET | Freq: Two times a day (BID) | ORAL | Status: DC
  Administered 2023-04-15 – 2023-04-19 (×8): 25 mg via ORAL
  Filled 2023-04-15 (×8): qty 1

## 2023-04-15 MED ORDER — PROPYLTHIOURACIL 50 MG PO TABS
50.0000 mg | ORAL_TABLET | Freq: Every day | ORAL | Status: DC
  Administered 2023-04-16 – 2023-04-19 (×4): 50 mg via ORAL
  Filled 2023-04-15 (×4): qty 1

## 2023-04-15 MED ORDER — DEXTROSE 50 % IV SOLN
1.0000 | Freq: Once | INTRAVENOUS | Status: AC
  Administered 2023-04-15: 50 mL via INTRAVENOUS
  Filled 2023-04-15: qty 50

## 2023-04-15 MED ORDER — ALBUTEROL SULFATE (2.5 MG/3ML) 0.083% IN NEBU: 3.0000 mL | INHALATION_SOLUTION | Freq: Four times a day (QID) | RESPIRATORY_TRACT | Status: DC | PRN

## 2023-04-15 MED ORDER — INSULIN ASPART 100 UNIT/ML IV SOLN
5.0000 [IU] | Freq: Once | INTRAVENOUS | Status: AC
  Administered 2023-04-15: 5 [IU] via INTRAVENOUS

## 2023-04-15 MED ORDER — SODIUM CHLORIDE 0.9 % IV SOLN: INTRAVENOUS | Status: AC

## 2023-04-15 MED ORDER — APIXABAN 2.5 MG PO TABS
2.5000 mg | ORAL_TABLET | Freq: Two times a day (BID) | ORAL | Status: DC
  Administered 2023-04-15 – 2023-04-19 (×8): 2.5 mg via ORAL
  Filled 2023-04-15 (×8): qty 1

## 2023-04-15 MED ORDER — ONDANSETRON HCL 4 MG PO TABS: 4.0000 mg | ORAL_TABLET | Freq: Four times a day (QID) | ORAL | Status: DC | PRN

## 2023-04-15 MED ORDER — LATANOPROST 0.005 % OP SOLN
1.0000 [drp] | Freq: Every morning | OPHTHALMIC | Status: DC
  Administered 2023-04-17 – 2023-04-19 (×3): 1 [drp] via OPHTHALMIC
  Filled 2023-04-15 (×2): qty 2.5

## 2023-04-15 MED ORDER — INSULIN ASPART 100 UNIT/ML IJ SOLN: 0.0000 [IU] | Freq: Every day | INTRAMUSCULAR | Status: DC

## 2023-04-15 MED ORDER — ATORVASTATIN CALCIUM 40 MG PO TABS
40.0000 mg | ORAL_TABLET | Freq: Every day | ORAL | Status: DC
  Administered 2023-04-16 – 2023-04-19 (×4): 40 mg via ORAL
  Filled 2023-04-15 (×4): qty 1

## 2023-04-15 MED ORDER — CALCIUM GLUCONATE-NACL 1-0.675 GM/50ML-% IV SOLN
1.0000 g | Freq: Once | INTRAVENOUS | Status: AC
  Administered 2023-04-15: 1000 mg via INTRAVENOUS
  Filled 2023-04-15: qty 50

## 2023-04-15 MED ORDER — INSULIN ASPART 100 UNIT/ML IJ SOLN
0.0000 [IU] | Freq: Three times a day (TID) | INTRAMUSCULAR | Status: DC
  Administered 2023-04-17: 1 [IU] via SUBCUTANEOUS

## 2023-04-15 MED ORDER — ACETAMINOPHEN 325 MG PO TABS: 650.0000 mg | ORAL_TABLET | Freq: Four times a day (QID) | ORAL | Status: DC | PRN

## 2023-04-15 NOTE — ED Notes (Signed)
ED TO INPATIENT HANDOFF REPORT  ED Nurse Name and Phone #: Rodney Booze [phone]  S Name/Age/Gender Shaun Mclaughlin 88 y.o. male Room/Bed: 004C/004C  Code Status   Code Status: Full Code  Home/SNF/Other Nursing Home/[facility] Patient oriented to: self, place, time, and situation Is this baseline? Yes   Triage Complete: Triage complete  Chief Complaint Hyperkalemia [E87.5]  Triage Note Pt sent by PCP for abnormal labs Cr 3.6, K 7.1, calcium 7.44. Pt denies any sx.   Allergies No Known Allergies  Level of Care/Admitting Diagnosis ED Disposition     ED Disposition  Admit   Condition  --   Comment  Hospital Area: [facility] [[id number]]  Level of Care: Telemetry Medical [104]  May admit patient to [facility] or [facility] if equivalent level of care is available:: Yes  Covid Evaluation: Asymptomatic - no recent exposure (last 10 days) testing not required  Diagnosis: Hyperkalemia [829562]  Admitting Physician: Briscoe Deutscher [[id number]]  Attending Physician: Briscoe Deutscher [[id number]]  Certification:: I certify this patient will need inpatient services for at least 2 midnights  Expected Medical Readiness: 04/18/2023          B Medical/Surgery History Past Medical History:  Diagnosis Date   Acute on chronic combined systolic and diastolic congestive heart failure, NYHA class 2 (HCC) 06/29/2014   Arthritis    "maybe a little bit in some joints" (08/04/2012)   Atherosclerotic renal artery stenosis, unilateral (HCC)    lleft renal artery stenosis by duplex ultrasound   CAD (coronary artery disease),Hx RCA stenting-patent 06/2012  08/05/2012   Chronic anticoagulation, coumadin for PAF 08/05/2012   Chronic bronchitis (HCC)    "about q yr" (08/04/2012)   DM (diabetes mellitus) (HCC) 08/05/2012   External bleeding hemorrhoids ~ 2003; 2008; 2013   "removed polyps and no bleeding since" (08/04/2012)   GERD (gastroesophageal reflux disease)    Hip fracture  (HCC) 10/2016   RIGHT FEMORAL NECK    HTN (hypertension) 08/05/2012   Hypercholesteremia    Hyperlipidemia 08/05/2012   Hyperthyroidism    Kidney stone 1980's   PAF (paroxysmal atrial fibrillation), maintaining SR 08/05/2012   Pneumonia ~ 2008   PVD (peripheral vascular disease) with claudication, Lt Upper ext. pain, and known 80-90%distal Lt. subclavian artery stenosis  08/05/2012   S/P angioplasty with stent to Lt. subclavian artery -Nitrinol stent 08/04/12 08/05/2012   Type II diabetes mellitus (HCC)    "take RX; I'm prediabetic; don't have to  check my CBG qd; keep my weight down" (08/04/2012)   Past Surgical History:  Procedure Laterality Date   CARDIAC CATHETERIZATION  2008 and Dec 2013   patent coronaries   CARDIOVERSION N/A 07/13/2014   Procedure: CARDIOVERSION;  Surgeon: Thurmon Fair, MD;  Location: [facility];  Service: Cardiovascular;  Laterality: N/A;   CARDIOVERSION N/A 09/28/2014   Procedure: CARDIOVERSION;  Surgeon: Lars Masson, MD;  Location: [facility];  Service: Cardiovascular;  Laterality: N/A;   CATARACT EXTRACTION W/ INTRAOCULAR LENS IMPLANT     "right eye" (08/04/2012)   CORONARY ANGIOPLASTY WITH STENT PLACEMENT  2003   patent 12/13   GLAUCOMA SURGERY  2000's   "right eye; had laser procedure  to lower the pressure and prevent glaucoma" (08/04/2012)   HERNIA REPAIR  2000's   "umbilical" (08/04/2012)   INGUINAL HERNIA REPAIR  2000's   "right" (08/04/2012)   KIDNEY STONE SURGERY  1980's   LEFT HEART CATHETERIZATION WITH CORONARY ANGIOGRAM N/A 06/28/2012   Procedure:  LEFT HEART CATHETERIZATION WITH CORONARY ANGIOGRAM;  Surgeon: [name], MD;  Location: [facility] CATH LAB;  Service: Cardiovascular;  Laterality: N/A;   LITHOTRIPSY  1980's   "imploded in my kidney; ended up having to be cut open in my back" (08/04/2012)   SKIN CANCER EXCISION  2017 & 2018   SUBCLAVIAN STENT PLACEMENT  Jan 2014   Lt SCA   TONSILLECTOMY  1940's   TOTAL HIP ARTHROPLASTY Right 10/27/2016    Procedure: TOTAL HIP ARTHROPLASTY ANTERIOR APPROACH;  Surgeon: Samson Frederic, MD;  Location: [facility] OR;  Service: Orthopedics;  Laterality: Right;   UNILATERAL UPPER EXTREMEITY ANGIOGRAM N/A 08/04/2012   Procedure: UNILATERAL UPPER EXTREMEITY ANGIOGRAM;  Surgeon: [name], MD;  Location: [facility] CATH LAB;  Service: Cardiovascular;  Laterality: N/A;   UPPER EXTREMITY ANGIOGRAM Bilateral 06/28/2012   Procedure: UPPER EXTREMITY ANGIOGRAM;  Surgeon: [name], MD;  Location: [facility] CATH LAB;  Service: Cardiovascular;  Laterality: Bilateral;     A IV Location/Drains/Wounds Patient Lines/Drains/Airways Status     Active Line/Drains/Airways     Name Placement date Placement time Site Days   Peripheral IV 04/15/23 20 G 1.25" Anterior;Proximal;Right Forearm 04/15/23  2011  Forearm  less than 1   Incision (Closed) 10/27/16 Hip Right 10/27/16  1944  -- 2361            Intake/Output Last 24 hours  Intake/Output Summary (Last 24 hours) at 04/15/2023 2155 Last data filed at 04/15/2023 2153 Gross per 24 hour  Intake 100 ml  Output --  Net 100 ml    Labs/Imaging Results for orders placed or performed during the hospital encounter of 04/15/23 (from the past 48 hour(s))  CBC with Differential     Status: Abnormal   Collection Time: 04/15/23  5:10 PM  Result Value Ref Range   WBC 7.0 4.0 - 10.5 K/uL   RBC 4.67 4.22 - 5.81 MIL/uL   Hemoglobin 13.6 13.0 - 17.0 g/dL   HCT 16.1 09.6 - 04.5 %   MCV 88.7 80.0 - 100.0 fL   MCH 29.1 26.0 - 34.0 pg   MCHC 32.9 30.0 - 36.0 g/dL   RDW 40.9 (H) 81.1 - 91.4 %   Platelets 263 150 - 400 K/uL   nRBC 0.3 (H) 0.0 - 0.2 %   Neutrophils Relative % 89 %   Neutro Abs 6.2 1.7 - 7.7 K/uL   Lymphocytes Relative 3 %   Lymphs Abs 0.2 (L) 0.7 - 4.0 K/uL   Monocytes Relative 4 %   Monocytes Absolute 0.3 0.1 - 1.0 K/uL   Eosinophils Relative 4 %   Eosinophils Absolute 0.3 0.0 - 0.5 K/uL   Basophils Relative 0 %   Basophils Absolute 0.0 0.0 - 0.1 K/uL   nRBC 1  (H) 0 /100 WBC   Abs Immature Granulocytes 0.00 0.00 - 0.07 K/uL    Comment: Performed at [facility], [address], [place], [place] [address]  Comprehensive metabolic panel     Status: Abnormal   Collection Time: 04/15/23  5:10 PM  Result Value Ref Range   Sodium 129 (L) 135 - 145 mmol/L   Potassium 6.2 (H) 3.5 - 5.1 mmol/L   Chloride 91 (L) 98 - 111 mmol/L   CO2 20 (L) 22 - 32 mmol/L   Glucose, Bld 116 (H) 70 - 99 mg/dL    Comment: Glucose reference range applies only to samples taken after fasting for at least 8 hours.   BUN 65 (H)  8 - 23 mg/dL   Creatinine, Ser 7.82 (H) 0.61 - 1.24 mg/dL   Calcium 7.3 (L) 8.9 - 10.3 mg/dL   Total Protein 8.9 (H) 6.5 - 8.1 g/dL   Albumin 3.6 3.5 - 5.0 g/dL   AST 27 15 - 41 U/L   ALT 19 0 - 44 U/L   Alkaline Phosphatase 92 38 - 126 U/L   Total Bilirubin 1.3 (H) 0.3 - 1.2 mg/dL   GFR, Estimated 17 (L) >60 mL/min    Comment: (NOTE) Calculated using the CKD-EPI Creatinine Equation (2021)    Anion gap 18 (H) 5 - 15    Comment: Performed at [facility], [address], [place], [place] [address]  Magnesium     Status: Abnormal   Collection Time: 04/15/23  5:10 PM  Result Value Ref Range   Magnesium 0.6 (LL) 1.7 - 2.4 mg/dL    Comment: CRITICAL RESULT CALLED TO, READ BACK BY AND VERIFIED WITH T MARSHALL RN 04/15/2023 Vella Raring ADDED TIME 1812 BDN Performed at [facility], [address], [place], [place] [address] CORRECTED ON 09/19 AT 2037: PREVIOUSLY REPORTED AS 0.6 CRITICAL RESULT CALLED TO, READ BACK BY AND VERIFIED WITH T MARSHALL RN 04/15/2023 BNUNNERY   CBG monitoring, ED     Status: Abnormal   Collection Time: 04/15/23  7:43 PM  Result Value Ref Range   Glucose-Capillary 102 (H) 70 - 99 mg/dL    Comment: Glucose reference range applies only to samples taken after fasting for at least 8 hours.   Comment 1 Notify RN    Comment 2 Document in Chart    CT Chest Wo Contrast  Result Date: 04/15/2023 CLINICAL DATA:   Chest trauma EXAM: CT CHEST WITHOUT CONTRAST TECHNIQUE: Multidetector CT imaging of the chest was performed following the standard protocol without IV contrast. RADIATION DOSE REDUCTION: This exam was performed according to the departmental dose-optimization program which includes automated exposure control, adjustment of the mA and/or kV according to patient size and/or use of iterative reconstruction technique. COMPARISON:  CT of the chest 06/18/2018 FINDINGS: Cardiovascular: No significant vascular findings. Normal heart size. No pericardial effusion. There are atherosclerotic calcifications of the aorta and coronary arteries. Mediastinum/Nodes: Thyroid gland is heterogeneous. Thyroid nodules are present measuring up to 18 mm on the left. There are no enlarged mediastinal or hilar lymph nodes. The esophagus is within normal limits. Lungs/Pleura: There is stable mild smooth pleural thickening on the right with a small right pleural effusion, partially loculated. Slightly ovoid airspace opacities are seen in the right lower lobe and right middle lobe anteriorly which may represent rounded atelectasis, although other etiologies are not excluded. Findings are stable from prior. There is some stable scarring in the lateral right mid lung and right upper lobe. There is a single new peripheral nodule in the right upper lobe measuring 4 mm image 4/54. There is no pneumothorax. There some calcified granulomas in the left upper lobe. Upper Abdomen: Gallstones are present. Severe atherosclerotic calcifications are present. No acute findings. Musculoskeletal: The bones are osteopenic. There is trace chronic compression deformity of T7, unchanged. No acute fractures are seen. IMPRESSION: 1. No acute posttraumatic sequelae in the chest. 2. Stable small partially loculated right pleural effusion. 3. Stable airspace disease in the right lower lobe and right middle lobe favored as rounded atelectasis. 4. Incidental left thyroid  nodule measuring 1.8 cm. Recommend non-emergent thyroid ultrasound if clinically warranted given patient age. Reference: J Am Coll Radiol. 2015 Feb;12(2):  143-50 5. New solid pulmonary nodule within the upper lobe measuring 4 mm. Per Fleischner Society Guidelines, if patient is low risk for malignancy, no routine follow-up imaging is recommended. If patient is high risk for malignancy, a non-contrast Chest CT at 12 months is optional. If performed and the nodule is stable at 12 months, no further follow-up is recommended. These guidelines do not apply to immunocompromised patients and patients with cancer. Follow up in patients with significant comorbidities as clinically warranted. For lung cancer screening, adhere to Lung-RADS guidelines. Reference: Radiology. 2017; 284(1):228-43. 6. Cholelithiasis. Aortic Atherosclerosis (ICD10-I70.0). Electronically Signed   By: Darliss Cheney M.D.   On: 04/15/2023 21:02   CT Head Wo Contrast  Result Date: 04/15/2023 CLINICAL DATA:  Recent fall with headaches and neck pain, initial encounter EXAM: CT HEAD WITHOUT CONTRAST CT CERVICAL SPINE WITHOUT CONTRAST TECHNIQUE: Multidetector CT imaging of the head and cervical spine was performed following the standard protocol without intravenous contrast. Multiplanar CT image reconstructions of the cervical spine were also generated. RADIATION DOSE REDUCTION: This exam was performed according to the departmental dose-optimization program which includes automated exposure control, adjustment of the mA and/or kV according to patient size and/or use of iterative reconstruction technique. COMPARISON:  None Available. FINDINGS: CT HEAD FINDINGS Brain: No evidence of acute infarction, hemorrhage, hydrocephalus, extra-axial collection or mass lesion/mass effect. Chronic atrophic and ischemic changes are noted. Small lacunar infarct is noted within the left thalamus. 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 Vascular: No hyperdense vessel or  unexpected calcification. Skull: Normal. Negative for fracture or focal lesion. Sinuses/Orbits: No acute finding. Other: None. CT CERVICAL SPINE FINDINGS Alignment: Within normal limits. Skull base and vertebrae: 7 cervical segments are well visualized. Vertebral body height is well maintained. Multilevel disc space narrowing and facet hypertrophic changes are noted. No acute fracture or acute facet abnormality is noted. The odontoid is within normal limits. Soft tissues and spinal canal: Surrounding soft tissue structures are within normal limits. Upper chest: Visualized lung apices are unremarkable. Other: None IMPRESSION: CT of the head: Chronic atrophic and ischemic changes as described. CT of the cervical spine: Multilevel degenerative change without acute abnormality. Electronically Signed   By: Alcide Clever M.D.   On: 04/15/2023 20:49   CT Cervical Spine Wo Contrast  Result Date: 04/15/2023 CLINICAL DATA:  Recent fall with headaches and neck pain, initial encounter EXAM: CT HEAD WITHOUT CONTRAST CT CERVICAL SPINE WITHOUT CONTRAST TECHNIQUE: Multidetector CT imaging of the head and cervical spine was performed following the standard protocol without intravenous contrast. Multiplanar CT image reconstructions of the cervical spine were also generated. RADIATION DOSE REDUCTION: This exam was performed according to the departmental dose-optimization program which includes automated exposure control, adjustment of the mA and/or kV according to patient size and/or use of iterative reconstruction technique. COMPARISON:  None Available. FINDINGS: CT HEAD FINDINGS Brain: No evidence of acute infarction, hemorrhage, hydrocephalus, extra-axial collection or mass lesion/mass effect. Chronic atrophic and ischemic changes are noted. Small lacunar infarct is noted within the left thalamus. 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 Vascular: No hyperdense vessel or unexpected calcification. Skull: Normal. Negative for  fracture or focal lesion. Sinuses/Orbits: No acute finding. Other: None. CT CERVICAL SPINE FINDINGS Alignment: Within normal limits. Skull base and vertebrae: 7 cervical segments are well visualized. Vertebral body height is well maintained. Multilevel disc space narrowing and facet hypertrophic changes are noted. No acute fracture or acute facet abnormality is noted. The odontoid is within normal limits. Soft tissues and spinal  canal: Surrounding soft tissue structures are within normal limits. Upper chest: Visualized lung apices are unremarkable. Other: None IMPRESSION: CT of the head: Chronic atrophic and ischemic changes as described. CT of the cervical spine: Multilevel degenerative change without acute abnormality. Electronically Signed   By: Alcide Clever M.D.   On: 04/15/2023 20:49   DG Chest 1 View  Result Date: 04/15/2023 CLINICAL DATA:  Fall, abnormal potassium EXAM: CHEST  1 VIEW COMPARISON:  11/10/2019 FINDINGS: Volume loss in the right hemithorax with right lower lobe scarring/atelectasis and a small right pleural effusion, chronic. Left lung is clear. The heart is top-normal in size. IMPRESSION: Right lower lobe scarring/atelectasis with a small right pleural effusion, chronic. Electronically Signed   By: Charline Bills M.D.   On: 04/15/2023 19:54    Pending Labs Unresulted Labs (From admission, onward)     Start     Ordered   04/16/23 0500  Hemoglobin A1c  Tomorrow morning,   R       Comments: To assess prior glycemic control    04/15/23 2128   04/16/23 0500  Basic metabolic panel  Daily,   R      04/15/23 2128   04/16/23 0500  Magnesium  Tomorrow morning,   R        04/15/23 2128   04/16/23 0500  Phosphorus  Tomorrow morning,   R        04/15/23 2128   04/16/23 0500  CBC  Daily,   R      04/15/23 2128   04/15/23 2128  Urinalysis, Complete w Microscopic -Urine, Clean Catch  Once,   R       Question:  Specimen Source  Answer:  Urine, Clean Catch   04/15/23 2128   04/15/23  2128  Sodium, urine, random  Once,   R        04/15/23 2128   04/15/23 2128  Urea nitrogen, urine  Once,   R        04/15/23 2128   04/15/23 2128  Creatinine, urine, random  Once,   R        04/15/23 2128   04/15/23 1931  Glucose, random  ((URGENT) No ECG changes & Potassium > 6.5)  Once,   STAT       Comments: 1 hour after insulin administered.    04/15/23 1816            Vitals/Pain Today's Vitals   04/15/23 1655 04/15/23 1703 04/15/23 1704 04/15/23 2132  BP: 121/76     Pulse: (!) 104     Resp: 19     Temp:    97.8 F (36.6 C)  SpO2: 100%     Weight:   69 kg   Height:   5\' 6"  (1.676 m)   PainSc:  5       Isolation Precautions No active isolations  Medications Medications  atorvastatin (LIPITOR) tablet 40 mg (has no administration in time range)  metoprolol tartrate (LOPRESSOR) tablet 25 mg (has no administration in time range)  propylthiouracil (PTU) tablet 50 mg (has no administration in time range)  apixaban (ELIQUIS) tablet 2.5 mg (has no administration in time range)  albuterol (VENTOLIN HFA) 108 (90 Base) MCG/ACT inhaler 2 puff (has no administration in time range)  latanoprost (XALATAN) 0.005 % ophthalmic solution 1 drop (has no administration in time range)  insulin aspart (novoLOG) injection 0-6 Units (has no administration in time range)  insulin aspart (novoLOG) injection 0-5 Units (has  no administration in time range)  sodium chloride flush (NS) 0.9 % injection 3 mL (has no administration in time range)  acetaminophen (TYLENOL) tablet 650 mg (has no administration in time range)    Or  acetaminophen (TYLENOL) suppository 650 mg (has no administration in time range)  0.9 %  sodium chloride infusion (has no administration in time range)  senna-docusate (Senokot-S) tablet 1 tablet (has no administration in time range)  ondansetron (ZOFRAN) tablet 4 mg (has no administration in time range)    Or  ondansetron (ZOFRAN) injection 4 mg (has no administration  in time range)  magnesium sulfate IVPB 2 g 50 mL (0 g Intravenous Stopped 04/15/23 2153)  calcium gluconate 1 g/ 50 mL sodium chloride IVPB (0 mg Intravenous Stopped 04/15/23 2153)  insulin aspart (novoLOG) injection 5 Units (5 Units Intravenous Given 04/15/23 2115)    And  dextrose 50 % solution 50 mL (50 mLs Intravenous Given 04/15/23 2113)  dextrose 10 % infusion ( Intravenous New Bag/Given 04/15/23 2112)  sodium zirconium cyclosilicate (LOKELMA) packet 10 g (10 g Oral Given 04/15/23 1919)    Mobility walks     Focused Assessments Cardiac Assessment Handoff:    No results found for: "CKTOTAL", "CKMB", "CKMBINDEX", "TROPONINI" No results found for: "DDIMER" Does the Patient currently have chest pain? No    R Recommendations: See Admitting Provider Note  Report given to:   Additional Notes:

## 2023-04-15 NOTE — Telephone Encounter (Signed)
Tried to reach patient phone just rings. No voicemail x3

## 2023-04-15 NOTE — ED Triage Notes (Signed)
Pt sent by PCP for abnormal labs Cr 3.6, K 7.1, calcium 7.44. Pt denies any sx.

## 2023-04-15 NOTE — Telephone Encounter (Signed)
Please let us know if his breathing worsens or if he has swelling of the lower extremities.  I am a little puzzled with the losartan.  At our last visit in August I thought he told me that he was no longer taking that medication?

## 2023-04-15 NOTE — Telephone Encounter (Signed)
Pt c/o medication issue:  1. Name of Medication:   losartan (COZAAR) 50 MG tablet    furosemide (LASIX) 80 MG tablet    2. How are you currently taking this medication (dosage and times per day)?   3. Are you having a reaction (difficulty breathing--STAT)? No  4. What is your medication issue? Pt calling to make provider aware that his PCP changed dosage of Losartan to 20 MG and advised him to stop taking the Furosemide for now. Pt states that this changes came about due to a recent fall that he had. Pt would like a callback regarding this matter, Please advise

## 2023-04-15 NOTE — H&P (Signed)
History and Physical    DUANNE YALE WGN:[id number] DOB: February 17, 1935 DOA: 04/15/2023  PCP: Tally Joe, MD   Patient coming from: Home   Chief Complaint: Abnormal labs, lightheaded   HPI: Shaun Mclaughlin is a pleasant 88 y.o. male with medical history significant for hypertension, hyperlipidemia, type 2 diabetes mellitus, hypothyroidism, PAF on Eliquis, CAD, and chronic HFpEF who presents with lightheadedness and lab abnormalities.   Patient has had progressive lightheadedness with falls and was found to have renal failure with hyperkalemia on outpatient blood work.  He reports poor appetite for months now and was having intermittent diarrhea for approximately a week but not in the past 1 or 2 days.  There was no associated abdominal pain, nausea, or vomiting.    ED Course: Upon arrival to the ED, patient is found to be saturating well on room air with slightly elevated heart rate and stable blood pressure.  EKG demonstrates atrial fibrillation with rate 107 and RBBB.  Chest x-ray notable for chronic changes.  No acute findings are found on CT head or CT cervical spine.  CT of the chest reveals incidental thyroid and lung nodules.  Labs are most notable for BUN 65, creatinine 3.45, magnesium 0.6, and potassium 6.2.  Patient was treated with Lokelma, IV magnesium, IV calcium, and insulin with dextrose in the ED.  Review of Systems:  All other systems reviewed and apart from HPI, are negative.  Past Medical History:  Diagnosis Date   Acute on chronic combined systolic and diastolic congestive heart failure, NYHA class 2 (HCC) 06/29/2014   Arthritis    "maybe a little bit in some joints" (08/04/2012)   Atherosclerotic renal artery stenosis, unilateral (HCC)    lleft renal artery stenosis by duplex ultrasound   CAD (coronary artery disease),Hx RCA stenting-patent 06/2012  08/05/2012   Chronic anticoagulation, coumadin for PAF 08/05/2012   Chronic bronchitis (HCC)    "about q yr" (08/04/2012)    DM (diabetes mellitus) (HCC) 08/05/2012   External bleeding hemorrhoids ~ 2003; 2008; 2013   "removed polyps and no bleeding since" (08/04/2012)   GERD (gastroesophageal reflux disease)    Hip fracture (HCC) 10/2016   RIGHT FEMORAL NECK    HTN (hypertension) 08/05/2012   Hypercholesteremia    Hyperlipidemia 08/05/2012   Hyperthyroidism    Kidney stone 1980's   PAF (paroxysmal atrial fibrillation), maintaining SR 08/05/2012   Pneumonia ~ 2008   PVD (peripheral vascular disease) with claudication, Lt Upper ext. pain, and known 80-90%distal Lt. subclavian artery stenosis  08/05/2012   S/P angioplasty with stent to Lt. subclavian artery -Nitrinol stent 08/04/12 08/05/2012   Type II diabetes mellitus (HCC)    "take RX; I'm prediabetic; don't have to  check my CBG qd; keep my weight down" (08/04/2012)    Past Surgical History:  Procedure Laterality Date   CARDIAC CATHETERIZATION  2008 and Dec 2013   patent coronaries   CARDIOVERSION N/A 07/13/2014   Procedure: CARDIOVERSION;  Surgeon: Thurmon Fair, MD;  Location: [facility];  Service: Cardiovascular;  Laterality: N/A;   CARDIOVERSION N/A 09/28/2014   Procedure: CARDIOVERSION;  Surgeon: Lars Masson, MD;  Location: [facility];  Service: Cardiovascular;  Laterality: N/A;   CATARACT EXTRACTION W/ INTRAOCULAR LENS IMPLANT     "right eye" (08/04/2012)   CORONARY ANGIOPLASTY WITH STENT PLACEMENT  2003   patent 12/13   GLAUCOMA SURGERY  2000's   "right eye; had laser procedure  to lower the pressure and prevent glaucoma" (08/04/2012)   HERNIA  REPAIR  2000's   "umbilical" (08/04/2012)   INGUINAL HERNIA REPAIR  2000's   "right" (08/04/2012)   KIDNEY STONE SURGERY  1980's   LEFT HEART CATHETERIZATION WITH CORONARY ANGIOGRAM N/A 06/28/2012   Procedure: LEFT HEART CATHETERIZATION WITH CORONARY ANGIOGRAM;  Surgeon: [name], MD;  Location: [facility] CATH LAB;  Service: Cardiovascular;  Laterality: N/A;   LITHOTRIPSY  1980's   "imploded in my kidney;  ended up having to be cut open in my back" (08/04/2012)   SKIN CANCER EXCISION  2017 & 2018   SUBCLAVIAN STENT PLACEMENT  Jan 2014   Lt SCA   TONSILLECTOMY  1940's   TOTAL HIP ARTHROPLASTY Right 10/27/2016   Procedure: TOTAL HIP ARTHROPLASTY ANTERIOR APPROACH;  Surgeon: Samson Frederic, MD;  Location: [facility] OR;  Service: Orthopedics;  Laterality: Right;   UNILATERAL UPPER EXTREMEITY ANGIOGRAM N/A 08/04/2012   Procedure: UNILATERAL UPPER EXTREMEITY ANGIOGRAM;  Surgeon: [name], MD;  Location: [facility] CATH LAB;  Service: Cardiovascular;  Laterality: N/A;   UPPER EXTREMITY ANGIOGRAM Bilateral 06/28/2012   Procedure: UPPER EXTREMITY ANGIOGRAM;  Surgeon: [name], MD;  Location: [facility] CATH LAB;  Service: Cardiovascular;  Laterality: Bilateral;    Social History:   reports that he has been smoking cigarettes and pipe. He has a 12.5 pack-year smoking history. He has never used smokeless tobacco. He reports current alcohol use. He reports that he does not use drugs.  No Known Allergies  Family History  Problem Relation Age of Onset   Stroke Mother 72   Hypertension Mother    Coronary artery disease Father 16     Prior to Admission medications   Medication Sig Start Date End Date Taking? Authorizing Provider  albuterol (PROVENTIL HFA;VENTOLIN HFA) 108 (90 Base) MCG/ACT inhaler Inhale 2 puffs into the lungs every 6 (six) hours as needed for wheezing or shortness of breath. 02/19/17   Croitoru, Mihai, MD  apixaban (ELIQUIS) 2.5 MG TABS tablet Take 1 tablet (2.5 mg total) by mouth 2 (two) times daily. 08/14/22   Croitoru, Mihai, MD  Ascorbic Acid (VITAMIN C WITH ROSE HIPS) 1000 MG tablet Take 1,000 mg by mouth daily.    [provider]  atorvastatin (LIPITOR) 40 MG tablet Take 1 tablet (40 mg total) by mouth daily. 03/20/16   Croitoru, Mihai, MD  furosemide (LASIX) 80 MG tablet TAKE 1 TABLET BY MOUTH ONCE DAILY 11/24/22   Croitoru, Mihai, MD  Hypromellose 0.2 % SOLN Apply 2 drops to eye as  needed.  Patient not taking: Reported on 03/12/2023    [provider]  KRILL OIL PO Take 1 capsule by mouth daily.     [provider]  latanoprost (XALATAN) 0.005 % ophthalmic solution 1 drop every morning. 09/05/19   [provider]  losartan (COZAAR) 50 MG tablet TAKE 1 TABLET BY MOUTH ONCE DAILY. CALL DR TO MAKE APPT FOR REFILLS Patient not taking: Reported on 03/12/2023 01/07/23   Lennette Bihari, MD  metFORMIN (GLUCOPHAGE-XR) 500 MG 24 hr tablet Take 500 mg by mouth daily with breakfast.    [provider]  metoprolol tartrate (LOPRESSOR) 25 MG tablet TAKE 1 TABLET BY MOUTH TWICE DAILY 09/03/22   Croitoru, Mihai, MD  Multiple Vitamin (MULTIVITAMIN WITH MINERALS) TABS Take 1 tablet by mouth daily.    [provider]  naproxen sodium (ANAPROX) 220 MG tablet Take 220 mg by mouth 2 (two) times daily as needed (PAIN).    [provider]  omeprazole (PRILOSEC) 20 MG capsule  Take 20 mg by mouth daily as needed (heartburn).     [provider]  propylthiouracil (PTU) 50 MG tablet Take 50 mg by mouth daily.    [provider]  triamcinolone (NASACORT) 55 MCG/ACT AERO nasal inhaler Place 2 sprays into the nose daily as needed (ALLERGIES).    [provider]  trolamine salicylate (ASPERCREME) 10 % cream Apply 1 application topically as needed for muscle pain.    [provider]    Physical Exam: Vitals:   04/15/23 1655 04/15/23 1704  BP: 121/76   Pulse: (!) 104   Resp: 19   SpO2: 100%   Weight:  69 kg  Height:  5\' 6"  (1.676 m)    Constitutional: NAD, calm  Eyes: PERTLA, lids and conjunctivae normal ENMT: Mucous membranes are moist. Posterior pharynx clear of any exudate or lesions.   Neck: supple, no masses  Respiratory: no wheezing, no crackles. No accessory muscle use.  Cardiovascular: S1 & S2 heard, regular rate and rhythm. No extremity edema.   Abdomen: No distension, no tenderness, soft. Bowel sounds  active.  Musculoskeletal: no clubbing / cyanosis. No joint deformity upper and lower extremities.   Skin: Crusted lesions about the left temple. Warm, dry, well-perfused. Neurologic: CN 2-12 grossly intact. Moving all extremities. Alert and oriented.  Psychiatric: Pleasant. Cooperative.    Labs and Imaging on Admission: I have personally reviewed following labs and imaging studies  CBC: Recent Labs  Lab 04/15/23 1710  WBC 7.0  NEUTROABS 6.2  HGB 13.6  HCT 41.4  MCV 88.7  PLT 263   Basic Metabolic Panel: Recent Labs  Lab 04/15/23 1710  NA 129*  K 6.2*  CL 91*  CO2 20*  GLUCOSE 116*  BUN 65*  CREATININE 3.45*  CALCIUM 7.3*  MG 0.6*   GFR: Estimated Creatinine Clearance: 13.9 mL/min (A) (by C-G formula based on SCr of 3.45 mg/dL (H)). Liver Function Tests: Recent Labs  Lab 04/15/23 1710  AST 27  ALT 19  ALKPHOS 92  BILITOT 1.3*  PROT 8.9*  ALBUMIN 3.6   No results for input(s): "LIPASE", "AMYLASE" in the last 168 hours. No results for input(s): "AMMONIA" in the last 168 hours. Coagulation Profile: No results for input(s): "INR", "PROTIME" in the last 168 hours. Cardiac Enzymes: No results for input(s): "CKTOTAL", "CKMB", "CKMBINDEX", "TROPONINI" in the last 168 hours. BNP (last 3 results) No results for input(s): "PROBNP" in the last 8760 hours. HbA1C: No results for input(s): "HGBA1C" in the last 72 hours. CBG: Recent Labs  Lab 04/15/23 1943  GLUCAP 102*   Lipid Profile: No results for input(s): "CHOL", "HDL", "LDLCALC", "TRIG", "CHOLHDL", "LDLDIRECT" in the last 72 hours. Thyroid Function Tests: No results for input(s): "TSH", "T4TOTAL", "FREET4", "T3FREE", "THYROIDAB" in the last 72 hours. Anemia Panel: No results for input(s): "VITAMINB12", "FOLATE", "FERRITIN", "TIBC", "IRON", "RETICCTPCT" in the last 72 hours. Urine analysis: No results found for: "COLORURINE", "APPEARANCEUR", "LABSPEC", "PHURINE", "GLUCOSEU", "HGBUR", "BILIRUBINUR",  "KETONESUR", "PROTEINUR", "UROBILINOGEN", "NITRITE", "LEUKOCYTESUR" Sepsis Labs: @LABRCNTIP (procalcitonin:4,lacticidven:4) )No results found for this or any previous visit (from the past 240 hour(s)).   Radiological Exams on Admission: CT Chest Wo Contrast  Result Date: 04/15/2023 CLINICAL DATA:  Chest trauma EXAM: CT CHEST WITHOUT CONTRAST TECHNIQUE: Multidetector CT imaging of the chest was performed following the standard protocol without IV contrast. RADIATION DOSE REDUCTION: This exam was performed according to the departmental dose-optimization program which includes automated exposure control, adjustment of the mA and/or kV according to patient size and/or use  of iterative reconstruction technique. COMPARISON:  CT of the chest 06/18/2018 FINDINGS: Cardiovascular: No significant vascular findings. Normal heart size. No pericardial effusion. There are atherosclerotic calcifications of the aorta and coronary arteries. Mediastinum/Nodes: Thyroid gland is heterogeneous. Thyroid nodules are present measuring up to 18 mm on the left. There are no enlarged mediastinal or hilar lymph nodes. The esophagus is within normal limits. Lungs/Pleura: There is stable mild smooth pleural thickening on the right with a small right pleural effusion, partially loculated. Slightly ovoid airspace opacities are seen in the right lower lobe and right middle lobe anteriorly which may represent rounded atelectasis, although other etiologies are not excluded. Findings are stable from prior. There is some stable scarring in the lateral right mid lung and right upper lobe. There is a single new peripheral nodule in the right upper lobe measuring 4 mm image 4/54. There is no pneumothorax. There some calcified granulomas in the left upper lobe. Upper Abdomen: Gallstones are present. Severe atherosclerotic calcifications are present. No acute findings. Musculoskeletal: The bones are osteopenic. There is trace chronic compression  deformity of T7, unchanged. No acute fractures are seen. IMPRESSION: 1. No acute posttraumatic sequelae in the chest. 2. Stable small partially loculated right pleural effusion. 3. Stable airspace disease in the right lower lobe and right middle lobe favored as rounded atelectasis. 4. Incidental left thyroid nodule measuring 1.8 cm. Recommend non-emergent thyroid ultrasound if clinically warranted given patient age. Reference: J Am Coll Radiol. 2015 Feb;12(2): 143-50 5. New solid pulmonary nodule within the upper lobe measuring 4 mm. Per Fleischner Society Guidelines, if patient is low risk for malignancy, no routine follow-up imaging is recommended. If patient is high risk for malignancy, a non-contrast Chest CT at 12 months is optional. If performed and the nodule is stable at 12 months, no further follow-up is recommended. These guidelines do not apply to immunocompromised patients and patients with cancer. Follow up in patients with significant comorbidities as clinically warranted. For lung cancer screening, adhere to Lung-RADS guidelines. Reference: Radiology. 2017; 284(1):228-43. 6. Cholelithiasis. Aortic Atherosclerosis (ICD10-I70.0). Electronically Signed   By: Darliss Cheney M.D.   On: 04/15/2023 21:02   CT Head Wo Contrast  Result Date: 04/15/2023 CLINICAL DATA:  Recent fall with headaches and neck pain, initial encounter EXAM: CT HEAD WITHOUT CONTRAST CT CERVICAL SPINE WITHOUT CONTRAST TECHNIQUE: Multidetector CT imaging of the head and cervical spine was performed following the standard protocol without intravenous contrast. Multiplanar CT image reconstructions of the cervical spine were also generated. RADIATION DOSE REDUCTION: This exam was performed according to the departmental dose-optimization program which includes automated exposure control, adjustment of the mA and/or kV according to patient size and/or use of iterative reconstruction technique. COMPARISON:  None Available. FINDINGS: CT HEAD  FINDINGS Brain: No evidence of acute infarction, hemorrhage, hydrocephalus, extra-axial collection or mass lesion/mass effect. Chronic atrophic and ischemic changes are noted. Small lacunar infarct is noted within the left thalamus. 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 Vascular: No hyperdense vessel or unexpected calcification. Skull: Normal. Negative for fracture or focal lesion. Sinuses/Orbits: No acute finding. Other: None. CT CERVICAL SPINE FINDINGS Alignment: Within normal limits. Skull base and vertebrae: 7 cervical segments are well visualized. Vertebral body height is well maintained. Multilevel disc space narrowing and facet hypertrophic changes are noted. No acute fracture or acute facet abnormality is noted. The odontoid is within normal limits. Soft tissues and spinal canal: Surrounding soft tissue structures are within normal limits. Upper chest: Visualized lung apices are unremarkable. Other: None  IMPRESSION: CT of the head: Chronic atrophic and ischemic changes as described. CT of the cervical spine: Multilevel degenerative change without acute abnormality. Electronically Signed   By: Alcide Clever M.D.   On: 04/15/2023 20:49   CT Cervical Spine Wo Contrast  Result Date: 04/15/2023 CLINICAL DATA:  Recent fall with headaches and neck pain, initial encounter EXAM: CT HEAD WITHOUT CONTRAST CT CERVICAL SPINE WITHOUT CONTRAST TECHNIQUE: Multidetector CT imaging of the head and cervical spine was performed following the standard protocol without intravenous contrast. Multiplanar CT image reconstructions of the cervical spine were also generated. RADIATION DOSE REDUCTION: This exam was performed according to the departmental dose-optimization program which includes automated exposure control, adjustment of the mA and/or kV according to patient size and/or use of iterative reconstruction technique. COMPARISON:  None Available. FINDINGS: CT HEAD FINDINGS Brain: No evidence of acute infarction,  hemorrhage, hydrocephalus, extra-axial collection or mass lesion/mass effect. Chronic atrophic and ischemic changes are noted. Small lacunar infarct is noted within the left thalamus. 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 Vascular: No hyperdense vessel or unexpected calcification. Skull: Normal. Negative for fracture or focal lesion. Sinuses/Orbits: No acute finding. Other: None. CT CERVICAL SPINE FINDINGS Alignment: Within normal limits. Skull base and vertebrae: 7 cervical segments are well visualized. Vertebral body height is well maintained. Multilevel disc space narrowing and facet hypertrophic changes are noted. No acute fracture or acute facet abnormality is noted. The odontoid is within normal limits. Soft tissues and spinal canal: Surrounding soft tissue structures are within normal limits. Upper chest: Visualized lung apices are unremarkable. Other: None IMPRESSION: CT of the head: Chronic atrophic and ischemic changes as described. CT of the cervical spine: Multilevel degenerative change without acute abnormality. Electronically Signed   By: Alcide Clever M.D.   On: 04/15/2023 20:49   DG Chest 1 View  Result Date: 04/15/2023 CLINICAL DATA:  Fall, abnormal potassium EXAM: CHEST  1 VIEW COMPARISON:  11/10/2019 FINDINGS: Volume loss in the right hemithorax with right lower lobe scarring/atelectasis and a small right pleural effusion, chronic. Left lung is clear. The heart is top-normal in size. IMPRESSION: Right lower lobe scarring/atelectasis with a small right pleural effusion, chronic. Electronically Signed   By: Charline Bills M.D.   On: 04/15/2023 19:54    EKG: Independently reviewed. Atrial fibrillation, rate 107, RBBB.   Assessment/Plan   1. AKI superimposed on CKD 3A; hyperkalemia  - Hold Lasix and losartan, check UA, urine chemistries, and renal US, renally-dose medications, continue IVF hydration, repeat chem panel in am    2. Chronic HFpEF  - Hold diuretic, continue IVF  hydration, monitor weight and I/Os    3. Atrial fibrillation  - Continue Eliquis and metoprolol    4. CAD  - No anginal symptoms  - Continue ASA, Lipitor, and metoprolol    5. Hyperthyroidism  - Continue PTU    6. Type II DM  - Hold metformin, check CBGs, and use low-intensity SSI if needed   7. COPD  - Not in exacerbation on admission   - Continue albuterol as needed   8. Thyroid and lung nodules  - Noted incidentally on CT in ED  - Outpatient follow-up recommended     DVT prophylaxis: Eliquis  Code Status: Full  Level of Care: Level of care: Telemetry Medical Family Communication: None present   Disposition Plan:  Patient is from: Home  Anticipated d/c is to: TBD Anticipated d/c date is: 04/18/23  Patient currently: Pending improved/stable renal function and electrolytes  Consults called: None  Admission status: Inpatient     Briscoe Deutscher, MD [facility]  04/15/2023, 9:29 PM

## 2023-04-15 NOTE — Telephone Encounter (Signed)
Spoke with patient and he want to let you know he saw his PCP recently because he fell at home. PCP advised he needs to stop his lasix and he also decreased his losartan from 50 mg to 25 mg

## 2023-04-15 NOTE — ED Provider Triage Note (Signed)
Emergency Medicine Provider Triage Evaluation Note  Shaun Mclaughlin , a 88 y.o. male  was evaluated in triage.  Pt complains of an abnormal potassium level at 7.1 and was told to come here to the ER for further evaluation.  Patient denies any complaints.  Review of Systems  Positive: As above Negative: As above  Physical Exam  BP 121/76 (BP Location: Right Arm)   Pulse (!) 104   Resp 19   Ht 5\' 6"  (1.676 m)   Wt 69 kg   SpO2 100%   BMI 24.55 kg/m  Gen:   Awake, no distress   Resp:  Normal effort  MSK:   Moves extremities without difficulty   Medical Decision Making  Medically screening exam initiated at 5:06 PM.  Appropriate orders placed.  Shaun Mclaughlin was informed that the remainder of the evaluation will be completed by another provider, this initial triage assessment does not replace that evaluation, and the importance of remaining in the ED until their evaluation is complete.     Arabella Merles, PA-C 04/15/23 1707

## 2023-04-15 NOTE — ED Provider Notes (Signed)
[place] [facility] EMERGENCY DEPARTMENT AT [facility] Provider Note   CSN: [id number] Arrival date & time: 04/15/23  1646     History  No chief complaint on file.   Shaun Mclaughlin is a 88 y.o. male.  He has a history of A-fib on Eliquis, CHF, CAD, vascular disease, emphysema.  He said he has had a couple of weeks of dizziness.  Went to his PCP yesterday and had some labs drawn that were abnormal.  It sounds like they stopped his Lasix and Halved his losartan.  Rechecked his labs today and found them to be worse and sent him to the emergency department.  He also said today he fell backwards this morning when he lost his balance and hit his back and left elbow.  Has skin tears to his left elbow and some contusions on his back.  Denies any shortness of breath.  He feels his dizziness is actually a little bit better.  He denies any head strike or head injury.  No chest pain or shortness of breath no nausea vomiting diarrhea  The history is provided by the patient and a relative.  Dizziness Quality:  Lightheadedness Severity:  Moderate Onset quality:  Gradual Duration:  2 weeks Timing:  Intermittent Progression:  Improving Chronicity:  New Associated symptoms: no chest pain, no diarrhea, no headaches, no nausea, no shortness of breath and no vomiting   Risk factors: heart disease and multiple medications        Home Medications Prior to Admission medications   Medication Sig Start Date End Date Taking? Authorizing Provider  albuterol (PROVENTIL HFA;VENTOLIN HFA) 108 (90 Base) MCG/ACT inhaler Inhale 2 puffs into the lungs every 6 (six) hours as needed for wheezing or shortness of breath. 02/19/17   Croitoru, Mihai, MD  apixaban (ELIQUIS) 2.5 MG TABS tablet Take 1 tablet (2.5 mg total) by mouth 2 (two) times daily. 08/14/22   Croitoru, Mihai, MD  Ascorbic Acid (VITAMIN C WITH ROSE HIPS) 1000 MG tablet Take 1,000 mg by mouth daily.    [provider]  atorvastatin (LIPITOR) 40  MG tablet Take 1 tablet (40 mg total) by mouth daily. 03/20/16   Croitoru, Mihai, MD  furosemide (LASIX) 80 MG tablet TAKE 1 TABLET BY MOUTH ONCE DAILY 11/24/22   Croitoru, Mihai, MD  Hypromellose 0.2 % SOLN Apply 2 drops to eye as needed.  Patient not taking: Reported on 03/12/2023    [provider]  KRILL OIL PO Take 1 capsule by mouth daily.     [provider]  latanoprost (XALATAN) 0.005 % ophthalmic solution 1 drop every morning. 09/05/19   [provider]  losartan (COZAAR) 50 MG tablet TAKE 1 TABLET BY MOUTH ONCE DAILY. CALL DR TO MAKE APPT FOR REFILLS Patient not taking: Reported on 03/12/2023 01/07/23   Lennette Bihari, MD  metFORMIN (GLUCOPHAGE-XR) 500 MG 24 hr tablet Take 500 mg by mouth daily with breakfast.    [provider]  metoprolol tartrate (LOPRESSOR) 25 MG tablet TAKE 1 TABLET BY MOUTH TWICE DAILY 09/03/22   Croitoru, Mihai, MD  Multiple Vitamin (MULTIVITAMIN WITH MINERALS) TABS Take 1 tablet by mouth daily.    [provider]  naproxen sodium (ANAPROX) 220 MG tablet Take 220 mg by mouth 2 (two) times daily as needed (PAIN).    [provider]  omeprazole (PRILOSEC) 20 MG capsule Take 20 mg by mouth daily as needed (heartburn).     [provider]  propylthiouracil (PTU) 50  MG tablet Take 50 mg by mouth daily.    [provider]  triamcinolone (NASACORT) 55 MCG/ACT AERO nasal inhaler Place 2 sprays into the nose daily as needed (ALLERGIES).    [provider]  trolamine salicylate (ASPERCREME) 10 % cream Apply 1 application topically as needed for muscle pain.    [provider]      Allergies    Patient has no known allergies.    Review of Systems   Review of Systems  Constitutional:  Negative for fever.  Respiratory:  Negative for shortness of breath.   Cardiovascular:  Negative for chest pain.  Gastrointestinal:  Negative for diarrhea, nausea and vomiting.  Musculoskeletal:  Positive  for back pain.  Skin:  Positive for wound.  Neurological:  Positive for dizziness. Negative for headaches.    Physical Exam Updated Vital Signs BP 121/76 (BP Location: Right Arm)   Pulse (!) 104   Resp 19   Ht 5\' 6"  (1.676 m)   Wt 69 kg   SpO2 100%   BMI 24.55 kg/m  Physical Exam Vitals and nursing note reviewed.  Constitutional:      General: He is not in acute distress.    Appearance: Normal appearance. He is well-developed.  HENT:     Head: Normocephalic and atraumatic.  Eyes:     Conjunctiva/sclera: Conjunctivae normal.  Cardiovascular:     Rate and Rhythm: Tachycardia present. Rhythm irregular.     Heart sounds: No murmur heard. Pulmonary:     Effort: Pulmonary effort is normal. No respiratory distress.     Breath sounds: Normal breath sounds.  Abdominal:     Palpations: Abdomen is soft.     Tenderness: There is no abdominal tenderness. There is no guarding or rebound.  Musculoskeletal:        General: No deformity. Normal range of motion.     Cervical back: Neck supple.     Comments: He has a few skin tears on his left elbow.  Full range of motion without any limitations.  On his upper back he has some bruising and a small abrasion.  Skin:    General: Skin is warm and dry.     Capillary Refill: Capillary refill takes less than 2 seconds.  Neurological:     General: No focal deficit present.     Mental Status: He is alert.     Cranial Nerves: No cranial nerve deficit.     Sensory: No sensory deficit.     Motor: No weakness.     ED Results / Procedures / Treatments   Labs (all labs ordered are listed, but only abnormal results are displayed) Labs Reviewed  CBC WITH DIFFERENTIAL/PLATELET - Abnormal; Notable for the following components:      Result Value   RDW 18.5 (*)    nRBC 0.3 (*)    All other components within normal limits  COMPREHENSIVE METABOLIC PANEL - Abnormal; Notable for the following components:   Sodium 129 (*)    Potassium 6.2 (*)     Chloride 91 (*)    CO2 20 (*)    Glucose, Bld 116 (*)    BUN 65 (*)    Creatinine, Ser 3.45 (*)    Calcium 7.3 (*)    Total Protein 8.9 (*)    Total Bilirubin 1.3 (*)    GFR, Estimated 17 (*)    Anion gap 18 (*)    All other components within normal limits  MAGNESIUM - Abnormal; Notable for  the following components:   Magnesium 0.6 (*)    All other components within normal limits  GLUCOSE, RANDOM    EKG EKG Interpretation Date/Time:  Thursday April 15 2023 17:13:40 EDT Ventricular Rate:  107 PR Interval:    QRS Duration:  132 QT Interval:  346 QTC Calculation: 461 R Axis:   260  Text Interpretation: Atrial fibrillation with rapid ventricular response Right bundle branch block Inferior infarct , age undetermined Anterolateral infarct , age undetermined Abnormal ECG When compared with ECG of 12-Mar-2023 09:22, increased rte from prior Confirmed by Meridee Score [phone]) on 04/15/2023 6:01:33 PM  Radiology US RENAL  Result Date: 04/15/2023 CLINICAL DATA:  Acute on chronic renal failure EXAM: RENAL / URINARY TRACT ULTRASOUND COMPLETE COMPARISON:  No MRI abdomen dated 05/27/2018 FINDINGS: Right Kidney: Renal measurements: 8.0 x 3.6 x 4.1 cm = volume: 62.4 mL. Echogenicity within normal limits. 2.0 x 1.2 x 1.8 cm interpolar simple cyst. No hydronephrosis. Left Kidney: Renal measurements: 9.7 x 5.8 x 4.1 cm = volume: 119.5 mL. Echogenicity within normal limits. 6 mm interpolar calculus. No hydronephrosis. Bladder: Appears normal for degree of bladder distention. Other: None. IMPRESSION: 6 mm left renal calculus.  No hydronephrosis. 2.0 cm interpolar right renal simple cysts, benign. No follow-up recommended. Electronically Signed   By: Charline Bills M.D.   On: 04/15/2023 22:54   CT Chest Wo Contrast  Result Date: 04/15/2023 CLINICAL DATA:  Chest trauma EXAM: CT CHEST WITHOUT CONTRAST TECHNIQUE: Multidetector CT imaging of the chest was performed following the standard protocol  without IV contrast. RADIATION DOSE REDUCTION: This exam was performed according to the departmental dose-optimization program which includes automated exposure control, adjustment of the mA and/or kV according to patient size and/or use of iterative reconstruction technique. COMPARISON:  CT of the chest 06/18/2018 FINDINGS: Cardiovascular: No significant vascular findings. Normal heart size. No pericardial effusion. There are atherosclerotic calcifications of the aorta and coronary arteries. Mediastinum/Nodes: Thyroid gland is heterogeneous. Thyroid nodules are present measuring up to 18 mm on the left. There are no enlarged mediastinal or hilar lymph nodes. The esophagus is within normal limits. Lungs/Pleura: There is stable mild smooth pleural thickening on the right with a small right pleural effusion, partially loculated. Slightly ovoid airspace opacities are seen in the right lower lobe and right middle lobe anteriorly which may represent rounded atelectasis, although other etiologies are not excluded. Findings are stable from prior. There is some stable scarring in the lateral right mid lung and right upper lobe. There is a single new peripheral nodule in the right upper lobe measuring 4 mm image 4/54. There is no pneumothorax. There some calcified granulomas in the left upper lobe. Upper Abdomen: Gallstones are present. Severe atherosclerotic calcifications are present. No acute findings. Musculoskeletal: The bones are osteopenic. There is trace chronic compression deformity of T7, unchanged. No acute fractures are seen. IMPRESSION: 1. No acute posttraumatic sequelae in the chest. 2. Stable small partially loculated right pleural effusion. 3. Stable airspace disease in the right lower lobe and right middle lobe favored as rounded atelectasis. 4. Incidental left thyroid nodule measuring 1.8 cm. Recommend non-emergent thyroid ultrasound if clinically warranted given patient age. Reference: J Am Coll Radiol.  2015 Feb;12(2): 143-50 5. New solid pulmonary nodule within the upper lobe measuring 4 mm. Per Fleischner Society Guidelines, if patient is low risk for malignancy, no routine follow-up imaging is recommended. If patient is high risk for malignancy, a non-contrast Chest CT at 12 months is optional. If performed  and the nodule is stable at 12 months, no further follow-up is recommended. These guidelines do not apply to immunocompromised patients and patients with cancer. Follow up in patients with significant comorbidities as clinically warranted. For lung cancer screening, adhere to Lung-RADS guidelines. Reference: Radiology. 2017; 284(1):228-43. 6. Cholelithiasis. Aortic Atherosclerosis (ICD10-I70.0). Electronically Signed   By: Darliss Cheney M.D.   On: 04/15/2023 21:02   CT Head Wo Contrast  Result Date: 04/15/2023 CLINICAL DATA:  Recent fall with headaches and neck pain, initial encounter EXAM: CT HEAD WITHOUT CONTRAST CT CERVICAL SPINE WITHOUT CONTRAST TECHNIQUE: Multidetector CT imaging of the head and cervical spine was performed following the standard protocol without intravenous contrast. Multiplanar CT image reconstructions of the cervical spine were also generated. RADIATION DOSE REDUCTION: This exam was performed according to the departmental dose-optimization program which includes automated exposure control, adjustment of the mA and/or kV according to patient size and/or use of iterative reconstruction technique. COMPARISON:  None Available. FINDINGS: CT HEAD FINDINGS Brain: No evidence of acute infarction, hemorrhage, hydrocephalus, extra-axial collection or mass lesion/mass effect. Chronic atrophic and ischemic changes are noted. Small lacunar infarct is noted within the left thalamus. 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 Vascular: No hyperdense vessel or unexpected calcification. Skull: Normal. Negative for fracture or focal lesion. Sinuses/Orbits: No acute finding. Other: None. CT CERVICAL  SPINE FINDINGS Alignment: Within normal limits. Skull base and vertebrae: 7 cervical segments are well visualized. Vertebral body height is well maintained. Multilevel disc space narrowing and facet hypertrophic changes are noted. No acute fracture or acute facet abnormality is noted. The odontoid is within normal limits. Soft tissues and spinal canal: Surrounding soft tissue structures are within normal limits. Upper chest: Visualized lung apices are unremarkable. Other: None IMPRESSION: CT of the head: Chronic atrophic and ischemic changes as described. CT of the cervical spine: Multilevel degenerative change without acute abnormality. Electronically Signed   By: Alcide Clever M.D.   On: 04/15/2023 20:49   CT Cervical Spine Wo Contrast  Result Date: 04/15/2023 CLINICAL DATA:  Recent fall with headaches and neck pain, initial encounter EXAM: CT HEAD WITHOUT CONTRAST CT CERVICAL SPINE WITHOUT CONTRAST TECHNIQUE: Multidetector CT imaging of the head and cervical spine was performed following the standard protocol without intravenous contrast. Multiplanar CT image reconstructions of the cervical spine were also generated. RADIATION DOSE REDUCTION: This exam was performed according to the departmental dose-optimization program which includes automated exposure control, adjustment of the mA and/or kV according to patient size and/or use of iterative reconstruction technique. COMPARISON:  None Available. FINDINGS: CT HEAD FINDINGS Brain: No evidence of acute infarction, hemorrhage, hydrocephalus, extra-axial collection or mass lesion/mass effect. Chronic atrophic and ischemic changes are noted. Small lacunar infarct is noted within the left thalamus. 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 Vascular: No hyperdense vessel or unexpected calcification. Skull: Normal. Negative for fracture or focal lesion. Sinuses/Orbits: No acute finding. Other: None. CT CERVICAL SPINE FINDINGS Alignment: Within normal limits. Skull base  and vertebrae: 7 cervical segments are well visualized. Vertebral body height is well maintained. Multilevel disc space narrowing and facet hypertrophic changes are noted. No acute fracture or acute facet abnormality is noted. The odontoid is within normal limits. Soft tissues and spinal canal: Surrounding soft tissue structures are within normal limits. Upper chest: Visualized lung apices are unremarkable. Other: None IMPRESSION: CT of the head: Chronic atrophic and ischemic changes as described. CT of the cervical spine: Multilevel degenerative change without acute abnormality. Electronically Signed   By: Loraine Leriche  Lukens M.D.   On: 04/15/2023 20:49   DG Chest 1 View  Result Date: 04/15/2023 CLINICAL DATA:  Fall, abnormal potassium EXAM: CHEST  1 VIEW COMPARISON:  11/10/2019 FINDINGS: Volume loss in the right hemithorax with right lower lobe scarring/atelectasis and a small right pleural effusion, chronic. Left lung is clear. The heart is top-normal in size. IMPRESSION: Right lower lobe scarring/atelectasis with a small right pleural effusion, chronic. Electronically Signed   By: Charline Bills M.D.   On: 04/15/2023 19:54    Procedures .Critical Care  Performed by: Terrilee Files, MD Authorized by: Terrilee Files, MD   Critical care provider statement:    Critical care time (minutes):  45   Critical care time was exclusive of:  Separately billable procedures and treating other patients   Critical care was necessary to treat or prevent imminent or life-threatening deterioration of the following conditions:  Metabolic crisis   Critical care was time spent personally by me on the following activities:  Development of treatment plan with patient or surrogate, discussions with consultants, evaluation of patient's response to treatment, examination of patient, obtaining history from patient or surrogate, ordering and performing treatments and interventions, ordering and review of laboratory studies,  ordering and review of radiographic studies, pulse oximetry, re-evaluation of patient's condition and review of old charts   I assumed direction of critical care for this patient from another provider in my specialty: no       Medications Ordered in ED Medications  magnesium sulfate IVPB 2 g 50 mL (has no administration in time range)  calcium gluconate 1 g/ 50 mL sodium chloride IVPB (has no administration in time range)  insulin aspart (novoLOG) injection 5 Units (has no administration in time range)    And  dextrose 50 % solution 50 mL (has no administration in time range)  dextrose 10 % infusion (has no administration in time range)  sodium zirconium cyclosilicate (LOKELMA) packet 10 g (has no administration in time range)    ED Course/ Medical Decision Making/ A&P Clinical Course as of 04/16/23 1017  Thu Apr 15, 2023  2137 Discussed with Dr. Antionette Char [facility] hospitalist who will evaluate patient for admission. [MB]    Clinical Course User Index [MB] Terrilee Files, MD                                 Medical Decision Making Amount and/or Complexity of Data Reviewed Labs: ordered. Radiology: ordered.  Risk OTC drugs. Prescription drug management. Decision regarding hospitalization.   This patient complains of abnormal labs dizziness fall; this involves an extensive number of treatment Options and is a complaint that carries with it a high risk of complications and morbidity. The differential includes dehydration, metabolic derangement, fracture, pneumothorax, arrhythmia  I ordered, reviewed and interpreted labs, which included CBC fairly baseline, chemistries with new AKI hyperkalemia low magnesium, urinalysis without signs of infection I ordered medication Lokelma calcium magnesium dextrose and insulin fluids and reviewed PMP when indicated. I ordered imaging studies which included chest x-ray, CT head cervical spine chest and I independently    visualized and interpreted  imaging which showed no acute traumatic findings Additional history obtained from patient's wife and son Previous records obtained and reviewed in epic including recent PCP notes I consulted [facility] hospitalist Dr. Antionette Char and discussed lab and imaging findings and discussed disposition.  Cardiac monitoring reviewed, A-fib with slightly elevated rate Social determinants  considered, ongoing tobacco use Critical Interventions: Initiation of medications for patient's hyperkalemia  After the interventions stated above, I reevaluated the patient and found patient to be resting fairly comfortable in no distress Admission and further testing considered, he will need admission to the hospital for further management of his AKI and hyperkalemia.  Patient in agreement we will plan for admission.         Final Clinical Impression(s) / ED Diagnoses Final diagnoses:  AKI (acute kidney injury) (HCC)  Hyperkalemia  Hypomagnesemia  Skin tear of left elbow without complication, initial encounter  Contusion of back, unspecified laterality, initial encounter    Rx / DC Orders ED Discharge Orders     None         Terrilee Files, MD 04/16/23 1022

## 2023-04-15 NOTE — ED Notes (Signed)
Patient transported to CT 

## 2023-04-16 ENCOUNTER — Encounter (HOSPITAL_COMMUNITY): Payer: Self-pay | Admitting: Family Medicine

## 2023-04-16 DIAGNOSIS — I5032 Chronic diastolic (congestive) heart failure: Secondary | ICD-10-CM | POA: Diagnosis not present

## 2023-04-16 DIAGNOSIS — N179 Acute kidney failure, unspecified: Secondary | ICD-10-CM | POA: Diagnosis not present

## 2023-04-16 DIAGNOSIS — E860 Dehydration: Secondary | ICD-10-CM | POA: Diagnosis not present

## 2023-04-16 DIAGNOSIS — Z515 Encounter for palliative care: Secondary | ICD-10-CM | POA: Diagnosis not present

## 2023-04-16 DIAGNOSIS — Z7189 Other specified counseling: Secondary | ICD-10-CM

## 2023-04-16 DIAGNOSIS — E875 Hyperkalemia: Secondary | ICD-10-CM | POA: Diagnosis not present

## 2023-04-16 DIAGNOSIS — N1831 Chronic kidney disease, stage 3a: Secondary | ICD-10-CM | POA: Diagnosis not present

## 2023-04-16 LAB — CBC
HCT: 36.8 % — ABNORMAL LOW (ref 39.0–52.0)
Hemoglobin: 12.2 g/dL — ABNORMAL LOW (ref 13.0–17.0)
MCH: 28.5 pg (ref 26.0–34.0)
MCHC: 33.2 g/dL (ref 30.0–36.0)
MCV: 86 fL (ref 80.0–100.0)
Platelets: 221 10*3/uL (ref 150–400)
RBC: 4.28 MIL/uL (ref 4.22–5.81)
RDW: 18 % — ABNORMAL HIGH (ref 11.5–15.5)
WBC: 5.8 10*3/uL (ref 4.0–10.5)
nRBC: 0.3 % — ABNORMAL HIGH (ref 0.0–0.2)

## 2023-04-16 LAB — BASIC METABOLIC PANEL
Anion gap: 13 (ref 5–15)
BUN: 54 mg/dL — ABNORMAL HIGH (ref 8–23)
CO2: 19 mmol/L — ABNORMAL LOW (ref 22–32)
Calcium: 7.1 mg/dL — ABNORMAL LOW (ref 8.9–10.3)
Chloride: 98 mmol/L (ref 98–111)
Creatinine, Ser: 2.52 mg/dL — ABNORMAL HIGH (ref 0.61–1.24)
GFR, Estimated: 24 mL/min — ABNORMAL LOW (ref >60)
Glucose, Bld: 134 mg/dL — ABNORMAL HIGH (ref 70–99)
Potassium: 4.3 mmol/L (ref 3.5–5.1)
Sodium: 130 mmol/L — ABNORMAL LOW (ref 135–145)

## 2023-04-16 LAB — GLUCOSE, CAPILLARY
Glucose-Capillary: 103 mg/dL — ABNORMAL HIGH (ref 70–99)
Glucose-Capillary: 107 mg/dL — ABNORMAL HIGH (ref 70–99)
Glucose-Capillary: 141 mg/dL — ABNORMAL HIGH (ref 70–99)
Glucose-Capillary: 171 mg/dL — ABNORMAL HIGH (ref 70–99)
Glucose-Capillary: 172 mg/dL — ABNORMAL HIGH (ref 70–99)

## 2023-04-16 LAB — HEMOGLOBIN A1C
Hgb A1c MFr Bld: 7.2 % — ABNORMAL HIGH (ref 4.8–5.6)
Mean Plasma Glucose: 159.94 mg/dL

## 2023-04-16 LAB — PHOSPHORUS: Phosphorus: 4.4 mg/dL (ref 2.5–4.6)

## 2023-04-16 LAB — MAGNESIUM: Magnesium: 1.2 mg/dL — ABNORMAL LOW (ref 1.7–2.4)

## 2023-04-16 LAB — GLUCOSE, RANDOM: Glucose, Bld: 86 mg/dL (ref 70–99)

## 2023-04-16 MED ORDER — MAGNESIUM SULFATE 4 GM/100ML IV SOLN
4.0000 g | Freq: Once | INTRAVENOUS | Status: AC
  Administered 2023-04-16: 4 g via INTRAVENOUS
  Filled 2023-04-16 (×2): qty 100

## 2023-04-16 NOTE — Progress Notes (Signed)
PROGRESS NOTE    Shaun Mclaughlin  GMW:[id number] DOB: 04-19-1935 DOA: 04/15/2023 PCP: Tally Joe, MD   Brief Narrative:  88 y.o. male with medical history significant for hypertension, hyperlipidemia, type 2 diabetes mellitus, hypothyroidism, PAF on Eliquis, CAD, and chronic HFpEF presented with lightheadedness and lab abnormalities (renal failure and hyperkalemia on outpatient blood work).  On presentation, CT of the head and cervical spine showed no acute findings.  CT of the chest revealed incidental thyroid and lung nodules.  Creatinine was 3.45, magnesium of 0.6 and potassium of 6.2.  He was treated with Lokelma, IV magnesium, IV calcium, insulin with dextrose and IV fluids.  Assessment & Plan:   AKI on CKD stage IIIa Hyperkalemia -Baseline creatinine of 1.2-1.6.  Presented with creatinine of 3.45 and potassium of 6.2. -treated with Lokelma, IV magnesium, IV calcium, insulin with dextrose and IV fluids. -Continue IV fluids.  Labs pending for today.  Monitor.  Renal ultrasound negative for hydronephrosis. -If renal function worsens, will get nephrology evaluation.  Losartan and Lasix on hold.  Hypomagnesemia -Treated on admission.  Labs pending.  Chronic diastolic heart failure Hypertension Hyperlipidemia -Losartan and Lasix on hold.  Strict input and output.  Daily weights.  Continue metoprolol and statin.  Cardiology consult  Paroxysmal A-fib -Rate controlled.  Continue Eliquis  CAD -Stable.  No anginal symptoms.  Continue aspirin, metoprolol  Hyperthyroidism -Continue propylthiouracil.  Diabetes mellitus type 2 -Metformin on hold.  Continue CBGs with SSI  COPD -Stable.  Continue albuterol as needed  Thyroid and lung nodules -Noted incidentally on CT in the ED.  Outpatient follow-up  Physical deconditioning -PT eval  Goals of care -Currently listed as full code.  Consult palliative care for goals of care discussion.  DVT prophylaxis: Eliquis Code Status:  Full Family Communication: None at bedside Disposition Plan: Status is: Inpatient Remains inpatient appropriate because: Of severity of illness    Consultants: Consult cardiology and palliative care  Procedures: None  Antimicrobials: None   Subjective: Patient seen and examined at bedside.  Denies worsening shortness of breath, chest pain or fever.  Still feels weak.  Objective: Vitals:   04/15/23 2300 04/16/23 0005 04/16/23 0626 04/16/23 0849  BP: (!) 153/72 (!) 152/76 128/67 113/68  Pulse: 89 89 90 91  Resp: (!) 23 20    Temp: 97.6 F (36.4 C) (!) 97.5 F (36.4 C) 98.2 F (36.8 C) 97.9 F (36.6 C)  TempSrc: Oral Oral Oral Oral  SpO2: 100% 99% 97% 98%  Weight:      Height:        Intake/Output Summary (Last 24 hours) at 04/16/2023 0959 Last data filed at 04/16/2023 0955 Gross per 24 hour  Intake 325 ml  Output 700 ml  Net -375 ml   Filed Weights   04/15/23 1704  Weight: 69 kg    Examination:  General exam: Appears calm and comfortable.  Elderly male lying in bed.  On room air. Respiratory system: Bilateral decreased breath sounds at bases Cardiovascular system: S1 & S2 heard, Rate controlled Gastrointestinal system: Abdomen is nondistended, soft and nontender. Normal bowel sounds heard. Extremities: No cyanosis, clubbing, edema  Central nervous system: Alert and oriented. No focal neurological deficits. Moving extremities Skin: No rashes, lesions or ulcers Psychiatry: Flat affect.  Not agitated.   Data Reviewed: I have personally reviewed following labs and imaging studies  CBC: Recent Labs  Lab 04/15/23 1710  WBC 7.0  NEUTROABS 6.2  HGB 13.6  HCT 41.4  MCV 88.7  PLT 263   Basic Metabolic Panel: Recent Labs  Lab 04/15/23 1710 04/16/23 0120  NA 129*  --   K 6.2*  --   CL 91*  --   CO2 20*  --   GLUCOSE 116* 86  BUN 65*  --   CREATININE 3.45*  --   CALCIUM 7.3*  --   MG 0.6*  --    GFR: Estimated Creatinine Clearance: 13.9 mL/min  (A) (by C-G formula based on SCr of 3.45 mg/dL (H)). Liver Function Tests: Recent Labs  Lab 04/15/23 1710  AST 27  ALT 19  ALKPHOS 92  BILITOT 1.3*  PROT 8.9*  ALBUMIN 3.6   No results for input(s): "LIPASE", "AMYLASE" in the last 168 hours. No results for input(s): "AMMONIA" in the last 168 hours. Coagulation Profile: No results for input(s): "INR", "PROTIME" in the last 168 hours. Cardiac Enzymes: No results for input(s): "CKTOTAL", "CKMB", "CKMBINDEX", "TROPONINI" in the last 168 hours. BNP (last 3 results) No results for input(s): "PROBNP" in the last 8760 hours. HbA1C: No results for input(s): "HGBA1C" in the last 72 hours. CBG: Recent Labs  Lab 04/15/23 1943 04/16/23 0008 04/16/23 0721  GLUCAP 102* 172* 103*   Lipid Profile: No results for input(s): "CHOL", "HDL", "LDLCALC", "TRIG", "CHOLHDL", "LDLDIRECT" in the last 72 hours. Thyroid Function Tests: No results for input(s): "TSH", "T4TOTAL", "FREET4", "T3FREE", "THYROIDAB" in the last 72 hours. Anemia Panel: No results for input(s): "VITAMINB12", "FOLATE", "FERRITIN", "TIBC", "IRON", "RETICCTPCT" in the last 72 hours. Sepsis Labs: No results for input(s): "PROCALCITON", "LATICACIDVEN" in the last 168 hours.  No results found for this or any previous visit (from the past 240 hour(s)).       Radiology Studies: US RENAL  Result Date: 04/15/2023 CLINICAL DATA:  Acute on chronic renal failure EXAM: RENAL / URINARY TRACT ULTRASOUND COMPLETE COMPARISON:  No MRI abdomen dated 05/27/2018 FINDINGS: Right Kidney: Renal measurements: 8.0 x 3.6 x 4.1 cm = volume: 62.4 mL. Echogenicity within normal limits. 2.0 x 1.2 x 1.8 cm interpolar simple cyst. No hydronephrosis. Left Kidney: Renal measurements: 9.7 x 5.8 x 4.1 cm = volume: 119.5 mL. Echogenicity within normal limits. 6 mm interpolar calculus. No hydronephrosis. Bladder: Appears normal for degree of bladder distention. Other: None. IMPRESSION: 6 mm left renal calculus.   No hydronephrosis. 2.0 cm interpolar right renal simple cysts, benign. No follow-up recommended. Electronically Signed   By: Charline Bills M.D.   On: 04/15/2023 22:54   CT Chest Wo Contrast  Result Date: 04/15/2023 CLINICAL DATA:  Chest trauma EXAM: CT CHEST WITHOUT CONTRAST TECHNIQUE: Multidetector CT imaging of the chest was performed following the standard protocol without IV contrast. RADIATION DOSE REDUCTION: This exam was performed according to the departmental dose-optimization program which includes automated exposure control, adjustment of the mA and/or kV according to patient size and/or use of iterative reconstruction technique. COMPARISON:  CT of the chest 06/18/2018 FINDINGS: Cardiovascular: No significant vascular findings. Normal heart size. No pericardial effusion. There are atherosclerotic calcifications of the aorta and coronary arteries. Mediastinum/Nodes: Thyroid gland is heterogeneous. Thyroid nodules are present measuring up to 18 mm on the left. There are no enlarged mediastinal or hilar lymph nodes. The esophagus is within normal limits. Lungs/Pleura: There is stable mild smooth pleural thickening on the right with a small right pleural effusion, partially loculated. Slightly ovoid airspace opacities are seen in the right lower lobe and right middle lobe anteriorly which may represent rounded atelectasis, although other etiologies are not excluded. Findings  are stable from prior. There is some stable scarring in the lateral right mid lung and right upper lobe. There is a single new peripheral nodule in the right upper lobe measuring 4 mm image 4/54. There is no pneumothorax. There some calcified granulomas in the left upper lobe. Upper Abdomen: Gallstones are present. Severe atherosclerotic calcifications are present. No acute findings. Musculoskeletal: The bones are osteopenic. There is trace chronic compression deformity of T7, unchanged. No acute fractures are seen. IMPRESSION: 1.  No acute posttraumatic sequelae in the chest. 2. Stable small partially loculated right pleural effusion. 3. Stable airspace disease in the right lower lobe and right middle lobe favored as rounded atelectasis. 4. Incidental left thyroid nodule measuring 1.8 cm. Recommend non-emergent thyroid ultrasound if clinically warranted given patient age. Reference: J Am Coll Radiol. 2015 Feb;12(2): 143-50 5. New solid pulmonary nodule within the upper lobe measuring 4 mm. Per Fleischner Society Guidelines, if patient is low risk for malignancy, no routine follow-up imaging is recommended. If patient is high risk for malignancy, a non-contrast Chest CT at 12 months is optional. If performed and the nodule is stable at 12 months, no further follow-up is recommended. These guidelines do not apply to immunocompromised patients and patients with cancer. Follow up in patients with significant comorbidities as clinically warranted. For lung cancer screening, adhere to Lung-RADS guidelines. Reference: Radiology. 2017; 284(1):228-43. 6. Cholelithiasis. Aortic Atherosclerosis (ICD10-I70.0). Electronically Signed   By: Darliss Cheney M.D.   On: 04/15/2023 21:02   CT Head Wo Contrast  Result Date: 04/15/2023 CLINICAL DATA:  Recent fall with headaches and neck pain, initial encounter EXAM: CT HEAD WITHOUT CONTRAST CT CERVICAL SPINE WITHOUT CONTRAST TECHNIQUE: Multidetector CT imaging of the head and cervical spine was performed following the standard protocol without intravenous contrast. Multiplanar CT image reconstructions of the cervical spine were also generated. RADIATION DOSE REDUCTION: This exam was performed according to the departmental dose-optimization program which includes automated exposure control, adjustment of the mA and/or kV according to patient size and/or use of iterative reconstruction technique. COMPARISON:  None Available. FINDINGS: CT HEAD FINDINGS Brain: No evidence of acute infarction, hemorrhage,  hydrocephalus, extra-axial collection or mass lesion/mass effect. Chronic atrophic and ischemic changes are noted. Small lacunar infarct is noted within the left thalamus. 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 Vascular: No hyperdense vessel or unexpected calcification. Skull: Normal. Negative for fracture or focal lesion. Sinuses/Orbits: No acute finding. Other: None. CT CERVICAL SPINE FINDINGS Alignment: Within normal limits. Skull base and vertebrae: 7 cervical segments are well visualized. Vertebral body height is well maintained. Multilevel disc space narrowing and facet hypertrophic changes are noted. No acute fracture or acute facet abnormality is noted. The odontoid is within normal limits. Soft tissues and spinal canal: Surrounding soft tissue structures are within normal limits. Upper chest: Visualized lung apices are unremarkable. Other: None IMPRESSION: CT of the head: Chronic atrophic and ischemic changes as described. CT of the cervical spine: Multilevel degenerative change without acute abnormality. Electronically Signed   By: Alcide Clever M.D.   On: 04/15/2023 20:49   CT Cervical Spine Wo Contrast  Result Date: 04/15/2023 CLINICAL DATA:  Recent fall with headaches and neck pain, initial encounter EXAM: CT HEAD WITHOUT CONTRAST CT CERVICAL SPINE WITHOUT CONTRAST TECHNIQUE: Multidetector CT imaging of the head and cervical spine was performed following the standard protocol without intravenous contrast. Multiplanar CT image reconstructions of the cervical spine were also generated. RADIATION DOSE REDUCTION: This exam was performed according to the departmental dose-optimization  program which includes automated exposure control, adjustment of the mA and/or kV according to patient size and/or use of iterative reconstruction technique. COMPARISON:  None Available. FINDINGS: CT HEAD FINDINGS Brain: No evidence of acute infarction, hemorrhage, hydrocephalus, extra-axial collection or mass lesion/mass  effect. Chronic atrophic and ischemic changes are noted. Small lacunar infarct is noted within the left thalamus. 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 Vascular: No hyperdense vessel or unexpected calcification. Skull: Normal. Negative for fracture or focal lesion. Sinuses/Orbits: No acute finding. Other: None. CT CERVICAL SPINE FINDINGS Alignment: Within normal limits. Skull base and vertebrae: 7 cervical segments are well visualized. Vertebral body height is well maintained. Multilevel disc space narrowing and facet hypertrophic changes are noted. No acute fracture or acute facet abnormality is noted. The odontoid is within normal limits. Soft tissues and spinal canal: Surrounding soft tissue structures are within normal limits. Upper chest: Visualized lung apices are unremarkable. Other: None IMPRESSION: CT of the head: Chronic atrophic and ischemic changes as described. CT of the cervical spine: Multilevel degenerative change without acute abnormality. Electronically Signed   By: Alcide Clever M.D.   On: 04/15/2023 20:49   DG Chest 1 View  Result Date: 04/15/2023 CLINICAL DATA:  Fall, abnormal potassium EXAM: CHEST  1 VIEW COMPARISON:  11/10/2019 FINDINGS: Volume loss in the right hemithorax with right lower lobe scarring/atelectasis and a small right pleural effusion, chronic. Left lung is clear. The heart is top-normal in size. IMPRESSION: Right lower lobe scarring/atelectasis with a small right pleural effusion, chronic. Electronically Signed   By: Charline Bills M.D.   On: 04/15/2023 19:54        Scheduled Meds:  apixaban  2.5 mg Oral BID   atorvastatin  40 mg Oral Daily   insulin aspart  0-5 Units Subcutaneous QHS   insulin aspart  0-6 Units Subcutaneous TID WC   latanoprost  1 drop Both Eyes q morning   metoprolol tartrate  25 mg Oral BID   propylthiouracil  50 mg Oral Daily   sodium chloride flush  3 mL Intravenous Q12H   Continuous Infusions:  sodium chloride 125 mL/hr at  04/16/23 0815          Glade Lloyd, MD [facility] 04/16/2023, 9:59 AM

## 2023-04-16 NOTE — Consult Note (Addendum)
Cardiology Consultation   Patient ID: TYRICE QUEENER MRN: [id number]; DOB: [birth year]  Admit date: 04/15/2023 Date of Consult: 04/16/2023  PCP:  Tally Joe, MD   [facility] Providers Cardiologist:  Thurmon Fair, MD        Patient Profile:   SULAIMAN FINTEL is a 88 y.o. male with a hx of permanent atrial fibrillation, left subclavian artery stenosis s/p stenting 2014, left renal artery stenosis, CAD s/p RCA stent 2003, COPD, hypertension, hyperlipidemia, DM2, history of HFrEF with improved EF and CKD stage III who is being seen 04/16/2023 for the evaluation of chronic diastolic heart failure at the request of Dr. Hanley Ben.  History of Present Illness:   Mr. Goods is a pleasant 88 year old male with past medical history of permanent atrial fibrillation, left subclavian artery stenosis s/p stenting 2014, left renal artery stenosis, CAD s/p RCA stent 2003, COPD, hypertension, hyperlipidemia, DM2, history of HFrEF with improved EF and CKD stage III.  He has a history of bradycardia and bifascicular block, Dr. Royann Shivers suspected patient may require a pacemaker at some point in the future.  EF was 35% in 2015, however improved to 50 to 55% by March 2023.last echocardiogram in March 2023 also revealed severely elevated pulmonary artery systolic pressure.  For the past 2 years, patient and his wife resides at [facility] assisted living facility.  He is on Eliquis 2.5 mg twice a day based on his age and renal function.  He is followed by [facility].  He is most recent cardiology visit with Dr. Royann Shivers was on 03/11/2021 at which time he was doing well.  His weight at the time was 152 pounds.  According to Dr. Erin Hearing note, his most recent creatinine at the time was 1.87.  According to the patient, for the past 30-month, he has not had a good appetite.  He only eats 2 meals a day and tries to eat a full breakfast.  Last week, patient had persistent diarrhea.  For the  past week, he has been having increasing dizziness.  Initially his dizziness only occurs when he tried to get up in the morning, however later he started having dizziness and the shaking episode even without body position changes. He denies any recent chest pain.  Blood work obtained at his PCPs office recent showed AKI and hyperkalemia. Yesterday he had a fall, he says the dizziness did not contribute to the fall, he tried to turn his body and had his foot caught up, he fell backward into the refrigerator and injured his left elbow.    He was sent to [facility] for further evaluation given acute kidney injury and electrolyte imbalance.  On arrival, his sodium was 129, potassium 6.2, creatinine elevated 3.45, magnesium 0.6.  White blood cell count normal.  Urinalysis was normal.  CT of the cervical spine showed no acute fracture.  CT of the head showed no acute intracranial abnormality however does show chronic atrophic and ischemic change.  CT of the chest showed small partially loculated right pleural effusion, incidental finding of 1.8 cm left thyroid nodule.  For his significant hyperkalemia, he was treated with Lokelma, insulin with dextrose and IV calcium.  He was also given IV magnesium given the low magnesium level.  He has been placed on IV fluid running at 125 mL/h overnight.  Renal ultrasound showed no sign of hydronephrosis.  Home medication include Lasix 80 mg daily and losartan 50 mg daily has been held.  After  overnight hydration, creatinine improved from 3.45 to 2.52.  Potassium improved to 4.3.  Sodium 130.  Cardiology service consulted for history of diastolic heart failure.  EKG on arrival showed atrial fibrillation, right bundle branch block, heart rate borderline elevated at 100.  Overnight, after IV hydration, heart rate improved to the 80s to 90s range.     Past Medical History:  Diagnosis Date   Acute on chronic combined systolic and diastolic congestive heart failure, NYHA  class 2 (HCC) 06/29/2014   Arthritis    "maybe a little bit in some joints" (08/04/2012)   Atherosclerotic renal artery stenosis, unilateral (HCC)    lleft renal artery stenosis by duplex ultrasound   CAD (coronary artery disease),Hx RCA stenting-patent 06/2012  08/05/2012   Chronic anticoagulation, coumadin for PAF 08/05/2012   Chronic bronchitis (HCC)    "about q yr" (08/04/2012)   DM (diabetes mellitus) (HCC) 08/05/2012   External bleeding hemorrhoids ~ 2003; 2008; 2013   "removed polyps and no bleeding since" (08/04/2012)   GERD (gastroesophageal reflux disease)    Hip fracture (HCC) 10/2016   RIGHT FEMORAL NECK    HTN (hypertension) 08/05/2012   Hypercholesteremia    Hyperlipidemia 08/05/2012   Hyperthyroidism    Kidney stone 1980's   PAF (paroxysmal atrial fibrillation), maintaining SR 08/05/2012   Pneumonia ~ 2008   PVD (peripheral vascular disease) with claudication, Lt Upper ext. pain, and known 80-90%distal Lt. subclavian artery stenosis  08/05/2012   S/P angioplasty with stent to Lt. subclavian artery -Nitrinol stent 08/04/12 08/05/2012   Type II diabetes mellitus (HCC)    "take RX; I'm prediabetic; don't have to  check my CBG qd; keep my weight down" (08/04/2012)    Past Surgical History:  Procedure Laterality Date   CARDIAC CATHETERIZATION  2008 and Dec 2013   patent coronaries   CARDIOVERSION N/A 07/13/2014   Procedure: CARDIOVERSION;  Surgeon: Thurmon Fair, MD;  Location: [facility];  Service: Cardiovascular;  Laterality: N/A;   CARDIOVERSION N/A 09/28/2014   Procedure: CARDIOVERSION;  Surgeon: Lars Masson, MD;  Location: [facility];  Service: Cardiovascular;  Laterality: N/A;   CATARACT EXTRACTION W/ INTRAOCULAR LENS IMPLANT     "right eye" (08/04/2012)   CORONARY ANGIOPLASTY WITH STENT PLACEMENT  2003   patent 12/13   GLAUCOMA SURGERY  2000's   "right eye; had laser procedure  to lower the pressure and prevent glaucoma" (08/04/2012)   HERNIA REPAIR  2000's   "umbilical"  (08/04/2012)   INGUINAL HERNIA REPAIR  2000's   "right" (08/04/2012)   KIDNEY STONE SURGERY  1980's   LEFT HEART CATHETERIZATION WITH CORONARY ANGIOGRAM N/A 06/28/2012   Procedure: LEFT HEART CATHETERIZATION WITH CORONARY ANGIOGRAM;  Surgeon: [name], MD;  Location: [facility] CATH LAB;  Service: Cardiovascular;  Laterality: N/A;   LITHOTRIPSY  1980's   "imploded in my kidney; ended up having to be cut open in my back" (08/04/2012)   SKIN CANCER EXCISION  2017 & 2018   SUBCLAVIAN STENT PLACEMENT  Jan 2014   Lt SCA   TONSILLECTOMY  1940's   TOTAL HIP ARTHROPLASTY Right 10/27/2016   Procedure: TOTAL HIP ARTHROPLASTY ANTERIOR APPROACH;  Surgeon: Samson Frederic, MD;  Location: [facility] OR;  Service: Orthopedics;  Laterality: Right;   UNILATERAL UPPER EXTREMEITY ANGIOGRAM N/A 08/04/2012   Procedure: UNILATERAL UPPER EXTREMEITY ANGIOGRAM;  Surgeon: [name], MD;  Location: [facility] CATH LAB;  Service: Cardiovascular;  Laterality: N/A;   UPPER EXTREMITY ANGIOGRAM Bilateral 06/28/2012   Procedure: UPPER EXTREMITY ANGIOGRAM;  Surgeon: [name], MD;  Location: [facility] CATH LAB;  Service: Cardiovascular;  Laterality: Bilateral;     Home Medications:  Prior to Admission medications   Medication Sig Start Date End Date Taking? Authorizing Provider  albuterol (PROVENTIL HFA;VENTOLIN HFA) 108 (90 Base) MCG/ACT inhaler Inhale 2 puffs into the lungs every 6 (six) hours as needed for wheezing or shortness of breath. 02/19/17  Yes Croitoru, Mihai, MD  apixaban (ELIQUIS) 2.5 MG TABS tablet Take 1 tablet (2.5 mg total) by mouth 2 (two) times daily. 08/14/22  Yes Croitoru, Mihai, MD  atorvastatin (LIPITOR) 40 MG tablet Take 1 tablet (40 mg total) by mouth daily. 03/20/16  Yes Croitoru, Mihai, MD  KRILL OIL PO Take 1 capsule by mouth daily.    Yes [provider]  latanoprost (XALATAN) 0.005 % ophthalmic solution Place 1 drop into both eyes at bedtime. 09/05/19  Yes [provider]  metFORMIN (GLUCOPHAGE-XR)  500 MG 24 hr tablet Take 500 mg by mouth daily with breakfast.   Yes [provider]  metoprolol tartrate (LOPRESSOR) 25 MG tablet TAKE 1 TABLET BY MOUTH TWICE DAILY 09/03/22  Yes Croitoru, Mihai, MD  omeprazole (PRILOSEC) 20 MG capsule Take 20 mg by mouth daily as needed (heartburn).    Yes [provider]  propylthiouracil (PTU) 50 MG tablet Take 50 mg by mouth daily.   Yes [provider]  triamcinolone (NASACORT) 55 MCG/ACT AERO nasal inhaler Place 2 sprays into the nose daily as needed (ALLERGIES).   Yes [provider]  trolamine salicylate (ASPERCREME) 10 % cream Apply 1 application topically as needed for muscle pain.   Yes [provider]    Inpatient Medications: Scheduled Meds:  apixaban  2.5 mg Oral BID   atorvastatin  40 mg Oral Daily   insulin aspart  0-5 Units Subcutaneous QHS   insulin aspart  0-6 Units Subcutaneous TID WC   latanoprost  1 drop Both Eyes q morning   metoprolol tartrate  25 mg Oral BID   propylthiouracil  50 mg Oral Daily   sodium chloride flush  3 mL Intravenous Q12H   Continuous Infusions:  sodium chloride 125 mL/hr at 04/16/23 0815   magnesium sulfate bolus IVPB     PRN Meds: acetaminophen **OR** acetaminophen, albuterol, ondansetron **OR** ondansetron (ZOFRAN) IV, senna-docusate  Allergies:   No Known Allergies  Social History:   Social History   Socioeconomic History   Marital status: Married    Spouse name: Not on file   Number of children: Not on file   Years of education: Not on file   Highest education level: Not on file  Occupational History   Not on file  Tobacco Use   Smoking status: Every Day    Current packs/day: 0.50    Average packs/day: 0.5 packs/day for 25.0 years (12.5 ttl pk-yrs)    Types: Cigarettes, Pipe   Smokeless tobacco: Never   Tobacco comments:    08/04/2012 "still smoke a cigar q once in awhile"  Substance and Sexual Activity   Alcohol use: Yes    Comment: OCCASIONAL    Drug use: No   Sexual activity: Never  Other Topics Concern   Not on file  Social History Narrative   Not on file   Social Determinants of Health   Financial Resource Strain: Not on file  Food Insecurity: No Food Insecurity (04/16/2023)   Hunger Vital Sign    Worried About Running Out of Food in the Last Year: Never true  Ran Out of Food in the Last Year: Never true  Transportation Needs: No Transportation Needs (04/16/2023)   PRAPARE - Administrator, Civil Service (Medical): No    Lack of Transportation (Non-Medical): No  Physical Activity: Not on file  Stress: Not on file  Social Connections: Not on file  Intimate Partner Violence: Not At Risk (04/16/2023)   Humiliation, Afraid, Rape, and Kick questionnaire    Fear of Current or Ex-Partner: No    Emotionally Abused: No    Physically Abused: No    Sexually Abused: No    Family History:    Family History  Problem Relation Age of Onset   Stroke Mother 34   Hypertension Mother    Coronary artery disease Father 19     ROS:  Please see the history of present illness.   All other ROS reviewed and negative.     Physical Exam/Data:   Vitals:   04/15/23 2300 04/16/23 0005 04/16/23 0626 04/16/23 0849  BP: (!) 153/72 (!) 152/76 128/67 113/68  Pulse: 89 89 90 91  Resp: (!) 23 20    Temp: 97.6 F (36.4 C) (!) 97.5 F (36.4 C) 98.2 F (36.8 C) 97.9 F (36.6 C)  TempSrc: Oral Oral Oral Oral  SpO2: 100% 99% 97% 98%  Weight:      Height:        Intake/Output Summary (Last 24 hours) at 04/16/2023 1147 Last data filed at 04/16/2023 0955 Gross per 24 hour  Intake 325 ml  Output 700 ml  Net -375 ml      04/15/2023    5:04 PM 03/12/2023    9:27 AM 09/24/2021    8:46 AM  Last 3 Weights  Weight (lbs) 152 lb 1.9 oz 152 lb 3.2 oz 148 lb 9.6 oz  Weight (kg) 69 kg 69.037 kg 67.405 kg     Body mass index is 24.55 kg/m.  General:  Well nourished, well developed, in no acute distress HEENT: normal Neck: no  JVD Vascular: No carotid bruits; Distal pulses 2+ bilaterally Cardiac:  normal S1, S2; irregular; no murmur  Lungs: Diminished breath sounds in the right base Abd: soft, nontender, no hepatomegaly  Ext: no edema Musculoskeletal:  No deformities, BUE and BLE strength normal and equal Skin: warm and dry  Neuro:  CNs 2-12 intact, no focal abnormalities noted Psych:  Normal affect   EKG:  The EKG was personally reviewed and demonstrates: Atrial fibrillation, right bundle branch block Telemetry:  Telemetry was personally reviewed and demonstrates: Atrial fibrillation, right bundle branch block, heart rate in the 80s to 90s.  Relevant CV Studies:  Echo 10/09/2021  1. Left ventricular ejection fraction, by estimation, is 50 to 55%. The  left ventricle has low normal function. The left ventricle has no regional  wall motion abnormalities. There is mild left ventricular hypertrophy.  Left ventricular diastolic function   could not be evaluated.   2. Right ventricular systolic function was not well visualized. The right  ventricular size is normal. There is severely elevated pulmonary artery  systolic pressure.   3. Left atrial size was moderately dilated.   4. The mitral valve is grossly normal. Trivial mitral valve  regurgitation. No evidence of mitral stenosis. Moderate mitral annular  calcification.   5. The aortic valve was not well visualized. There is moderate  calcification of the aortic valve. There is mild thickening of the aortic  valve. Aortic valve regurgitation is not visualized. Aortic valve  sclerosis/calcification is  present, without any  evidence of aortic stenosis.   6. The inferior vena cava is dilated in size with >50% respiratory  variability, suggesting right atrial pressure of 8 mmHg.   Comparison(s): No significant change from prior study. 06/08/14 EF 45-50%.  PA pressure .   Laboratory Data:  High Sensitivity Troponin:  No results for input(s):  "TROPONINIHS" in the last 720 hours.   Chemistry Recent Labs  Lab 04/22/2023 1710 04/16/23 0120 04/16/23 0910  NA 129*  --  130*  K 6.2*  --  4.3  CL 91*  --  98  CO2 20*  --  19*  GLUCOSE 116* 86 134*  BUN 65*  --  54*  CREATININE 3.45*  --  2.52*  CALCIUM 7.3*  --  7.1*  MG 0.6*  --  1.2*  GFRNONAA 17*  --  24*  ANIONGAP 18*  --  13    Recent Labs  Lab 04-22-2023 1710  PROT 8.9*  ALBUMIN 3.6  AST 27  ALT 19  ALKPHOS 92  BILITOT 1.3*   Lipids No results for input(s): "CHOL", "TRIG", "HDL", "LABVLDL", "LDLCALC", "CHOLHDL" in the last 168 hours.  Hematology Recent Labs  Lab 04/22/2023 1710 04/16/23 0910  WBC 7.0 5.8  RBC 4.67 4.28  HGB 13.6 12.2*  HCT 41.4 36.8*  MCV 88.7 86.0  MCH 29.1 28.5  MCHC 32.9 33.2  RDW 18.5* 18.0*  PLT 263 221   Thyroid No results for input(s): "TSH", "FREET4" in the last 168 hours.  BNPNo results for input(s): "BNP", "PROBNP" in the last 168 hours.  DDimer No results for input(s): "DDIMER" in the last 168 hours.   Radiology/Studies:  US RENAL  Result Date: 22-Apr-2023 CLINICAL DATA:  Acute on chronic renal failure EXAM: RENAL / URINARY TRACT ULTRASOUND COMPLETE COMPARISON:  No MRI abdomen dated 05/27/2018 FINDINGS: Right Kidney: Renal measurements: 8.0 x 3.6 x 4.1 cm = volume: 62.4 mL. Echogenicity within normal limits. 2.0 x 1.2 x 1.8 cm interpolar simple cyst. No hydronephrosis. Left Kidney: Renal measurements: 9.7 x 5.8 x 4.1 cm = volume: 119.5 mL. Echogenicity within normal limits. 6 mm interpolar calculus. No hydronephrosis. Bladder: Appears normal for degree of bladder distention. Other: None. IMPRESSION: 6 mm left renal calculus.  No hydronephrosis. 2.0 cm interpolar right renal simple cysts, benign. No follow-up recommended. Electronically Signed   By: Charline Bills M.D.   On: April 22, 2023 22:54   CT Chest Wo Contrast  Result Date: 04/22/23 CLINICAL DATA:  Chest trauma EXAM: CT CHEST WITHOUT CONTRAST TECHNIQUE: Multidetector CT  imaging of the chest was performed following the standard protocol without IV contrast. RADIATION DOSE REDUCTION: This exam was performed according to the departmental dose-optimization program which includes automated exposure control, adjustment of the mA and/or kV according to patient size and/or use of iterative reconstruction technique. COMPARISON:  CT of the chest 06/18/2018 FINDINGS: Cardiovascular: No significant vascular findings. Normal heart size. No pericardial effusion. There are atherosclerotic calcifications of the aorta and coronary arteries. Mediastinum/Nodes: Thyroid gland is heterogeneous. Thyroid nodules are present measuring up to 18 mm on the left. There are no enlarged mediastinal or hilar lymph nodes. The esophagus is within normal limits. Lungs/Pleura: There is stable mild smooth pleural thickening on the right with a small right pleural effusion, partially loculated. Slightly ovoid airspace opacities are seen in the right lower lobe and right middle lobe anteriorly which may represent rounded atelectasis, although other etiologies are not excluded. Findings are stable from prior. There is some  stable scarring in the lateral right mid lung and right upper lobe. There is a single new peripheral nodule in the right upper lobe measuring 4 mm image 4/54. There is no pneumothorax. There some calcified granulomas in the left upper lobe. Upper Abdomen: Gallstones are present. Severe atherosclerotic calcifications are present. No acute findings. Musculoskeletal: The bones are osteopenic. There is trace chronic compression deformity of T7, unchanged. No acute fractures are seen. IMPRESSION: 1. No acute posttraumatic sequelae in the chest. 2. Stable small partially loculated right pleural effusion. 3. Stable airspace disease in the right lower lobe and right middle lobe favored as rounded atelectasis. 4. Incidental left thyroid nodule measuring 1.8 cm. Recommend non-emergent thyroid ultrasound if  clinically warranted given patient age. Reference: J Am Coll Radiol. 2015 Feb;12(2): 143-50 5. New solid pulmonary nodule within the upper lobe measuring 4 mm. Per Fleischner Society Guidelines, if patient is low risk for malignancy, no routine follow-up imaging is recommended. If patient is high risk for malignancy, a non-contrast Chest CT at 12 months is optional. If performed and the nodule is stable at 12 months, no further follow-up is recommended. These guidelines do not apply to immunocompromised patients and patients with cancer. Follow up in patients with significant comorbidities as clinically warranted. For lung cancer screening, adhere to Lung-RADS guidelines. Reference: Radiology. 2017; 284(1):228-43. 6. Cholelithiasis. Aortic Atherosclerosis (ICD10-I70.0). Electronically Signed   By: Darliss Cheney M.D.   On: 04/15/2023 21:02   CT Head Wo Contrast  Result Date: 04/15/2023 CLINICAL DATA:  Recent fall with headaches and neck pain, initial encounter EXAM: CT HEAD WITHOUT CONTRAST CT CERVICAL SPINE WITHOUT CONTRAST TECHNIQUE: Multidetector CT imaging of the head and cervical spine was performed following the standard protocol without intravenous contrast. Multiplanar CT image reconstructions of the cervical spine were also generated. RADIATION DOSE REDUCTION: This exam was performed according to the departmental dose-optimization program which includes automated exposure control, adjustment of the mA and/or kV according to patient size and/or use of iterative reconstruction technique. COMPARISON:  None Available. FINDINGS: CT HEAD FINDINGS Brain: No evidence of acute infarction, hemorrhage, hydrocephalus, extra-axial collection or mass lesion/mass effect. Chronic atrophic and ischemic changes are noted. Small lacunar infarct is noted within the left thalamus. 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 Vascular: No hyperdense vessel or unexpected calcification. Skull: Normal. Negative for fracture or focal  lesion. Sinuses/Orbits: No acute finding. Other: None. CT CERVICAL SPINE FINDINGS Alignment: Within normal limits. Skull base and vertebrae: 7 cervical segments are well visualized. Vertebral body height is well maintained. Multilevel disc space narrowing and facet hypertrophic changes are noted. No acute fracture or acute facet abnormality is noted. The odontoid is within normal limits. Soft tissues and spinal canal: Surrounding soft tissue structures are within normal limits. Upper chest: Visualized lung apices are unremarkable. Other: None IMPRESSION: CT of the head: Chronic atrophic and ischemic changes as described. CT of the cervical spine: Multilevel degenerative change without acute abnormality. Electronically Signed   By: Alcide Clever M.D.   On: 04/15/2023 20:49   CT Cervical Spine Wo Contrast  Result Date: 04/15/2023 CLINICAL DATA:  Recent fall with headaches and neck pain, initial encounter EXAM: CT HEAD WITHOUT CONTRAST CT CERVICAL SPINE WITHOUT CONTRAST TECHNIQUE: Multidetector CT imaging of the head and cervical spine was performed following the standard protocol without intravenous contrast. Multiplanar CT image reconstructions of the cervical spine were also generated. RADIATION DOSE REDUCTION: This exam was performed according to the departmental dose-optimization program which includes automated exposure control, adjustment  of the mA and/or kV according to patient size and/or use of iterative reconstruction technique. COMPARISON:  None Available. FINDINGS: CT HEAD FINDINGS Brain: No evidence of acute infarction, hemorrhage, hydrocephalus, extra-axial collection or mass lesion/mass effect. Chronic atrophic and ischemic changes are noted. Small lacunar infarct is noted within the left thalamus. 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 Vascular: No hyperdense vessel or unexpected calcification. Skull: Normal. Negative for fracture or focal lesion. Sinuses/Orbits: No acute finding. Other: None. CT  CERVICAL SPINE FINDINGS Alignment: Within normal limits. Skull base and vertebrae: 7 cervical segments are well visualized. Vertebral body height is well maintained. Multilevel disc space narrowing and facet hypertrophic changes are noted. No acute fracture or acute facet abnormality is noted. The odontoid is within normal limits. Soft tissues and spinal canal: Surrounding soft tissue structures are within normal limits. Upper chest: Visualized lung apices are unremarkable. Other: None IMPRESSION: CT of the head: Chronic atrophic and ischemic changes as described. CT of the cervical spine: Multilevel degenerative change without acute abnormality. Electronically Signed   By: Alcide Clever M.D.   On: 04/15/2023 20:49   DG Chest 1 View  Result Date: 04/15/2023 CLINICAL DATA:  Fall, abnormal potassium EXAM: CHEST  1 VIEW COMPARISON:  11/10/2019 FINDINGS: Volume loss in the right hemithorax with right lower lobe scarring/atelectasis and a small right pleural effusion, chronic. Left lung is clear. The heart is top-normal in size. IMPRESSION: Right lower lobe scarring/atelectasis with a small right pleural effusion, chronic. Electronically Signed   By: Charline Bills M.D.   On: 04/15/2023 19:54     Assessment and Plan:   Dehydration: Likely due to poor oral intake for the past 6 months and diarrhea last week.  Improved with IV hydration.  Dizziness that has occurred in the past week has resolved.  AKI: Likely due to #1.  Creatinine from a year ago was between 1.24-1.59 range, however most recent office note by Dr. Royann Shivers mentioned that his recent creatinine was around 1.8 range.  He arrived with a creatinine of 3.45 yesterday, after overnight hydration, creatinine has improved to 2.52.  Hyperkalemia: Treated with Lokelma, insulin with dextrose and IV calcium.  Repeat potassium 4.3 this morning.  Hypomagnesemia: Repleted with IV magnesium on arrival  Hyponatremia: Sodium stable at 130 after overnight  hydration.  Left thyroid nodule: 1.8 cm left thyroid nodule noted on CT of the chest.  Will defer additional workup to outpatient  Chronic diastolic heart failure: Appears to be euvolemic on exam.  Initial x-ray showed loculated small right pleural effusion, however lab work and history is more consistent with dehydration.  Receiving 125 mL of fluid per hour.  Patient does not have any sign of acute heart failure on physical exam.  Consider decrease IV fluid to 75 mL/h to avoid heart failure  Permanent atrial fibrillation: On 2.5 mg twice a day of Eliquis at home based on age and renal function.  Heart rate initially elevated on arrival in the 100 range in the setting of dehydration, after overnight hydration, heart rate has improved to 80s to 90s.  CAD: Denies any recent chest pain  History of bifascicular block: Avoid up titration of beta-blocker.  Left subclavian artery stenosis: History of left subclavian artery stenting.  No claudication symptom, however left arm pressure is lower than right arm pressure.  Only obtain blood pressure in the right arm.   Risk Assessment/Risk Scores:          CHA2DS2-VASc Score = 6   This indicates  a 9.7% annual risk of stroke. The patient's score is based upon: CHF History: 1 HTN History: 1 Diabetes History: 1 Stroke History: 0 Vascular Disease History: 1 Age Score: 2 Gender Score: 0         For questions or updates, please contact [facility] Please consult [email or web address] for contact info under    Signed, Azalee Course, [place]  04/16/2023 11:47 AM  Patient seen and examined with Azalee Course PA.  Agree as above, with the following exceptions and changes as noted below. Pt appears in bright spirits and overall improving after volume resusictation. Agree with plans as above, would decrease fluid rate now that he's improving . Gen: NAD, CV: RRR, no murmurs, Lungs: clear, Abd: soft, Extrem: Warm, well perfused, no edema, Neuro/Psych: alert and  oriented x 3, normal mood and affect. All available labs, radiology testing, previous records reviewed. No evidence of diastolic heart failure at this time. Remainder as above.   Parke Poisson, MD 04/16/23 2:11 PM

## 2023-04-16 NOTE — Progress Notes (Signed)
New Admission Note:   Arrival Method: stretcher Mental Orientation: ax4 Telemetry: box 19 Assessment: Completed Skin: see flowsheet IV:nsl Pain: none Tubes: none Safety Measures: Safety Fall Prevention Plan has been discussed Admission: Completed [facility] Orientation: Patient has been orientated to the room, unit and staff.  Family: none  Orders have been reviewed and implemented. Will continue to monitor the patient. Call light has been placed within reach and bed alarm has been activated.   Artemio Aly BSN, RN Phone number: [phone]

## 2023-04-16 NOTE — Evaluation (Signed)
Occupational Therapy Evaluation Patient Details Name: Shaun Mclaughlin MRN: [id number] DOB: 21-Nov-1934 Today's Date: 04/16/2023   History of Present Illness 88 y.o. male presented with lightheadedness and lab abnormalities (renal failure and hyperkalemia on outpatient blood work). Past medical history significant for hypertension, hyperlipidemia, type 2 diabetes mellitus, hypothyroidism, PAF on Eliquis, CAD, and chronic HFpEF   Clinical Impression   Pt admitted for above, presents with impaired balance and noted to lose it posteriorly mainly when OOB but did have a moment where he nearly lost it sitting EOB. Pt needing min A for ambulation due to frequent LOBs, discussed with pt potential to transition to ALF but he refused. OT recommended pt DC home with [facility] as long as spouse is agreeable to providing care for pt 24/7 especially when OOB, he verbalized understanding. OT to continue to follow pt acutely to address deficits and help transition to next level of care.       If plan is discharge home, recommend the following: A little help with walking and/or transfers;A little help with bathing/dressing/bathroom;Assistance with cooking/housework    Functional Status Assessment  Patient has had a recent decline in their functional status and demonstrates the ability to make significant improvements in function in a reasonable and predictable amount of time.  Equipment Recommendations  None recommended by OT (pt has rec DME)    Recommendations for Other Services       Precautions / Restrictions Precautions Precautions: Fall Precaution Comments: 2 falls PTA Restrictions Weight Bearing Restrictions: No      Mobility Bed Mobility Overal bed mobility: Needs Assistance Bed Mobility: Supine to Sit, Sit to Supine     Supine to sit: Supervision Sit to supine: Supervision        Transfers Overall transfer level: Needs assistance Equipment used: Rolling walker (2 wheels) Transfers: Sit  to/from Stand Sit to Stand: Contact guard assist                  Balance Overall balance assessment: Needs assistance Sitting-balance support: Feet supported, No upper extremity supported Sitting balance-Leahy Scale: Good     Standing balance support: During functional activity, Bilateral upper extremity supported Standing balance-Leahy Scale: Fair Standing balance comment: Does not fully need RW for small pivots/gait but due to post LOB at sink may need some form of UE support                           ADL either performed or assessed with clinical judgement   ADL Overall ADL's : Needs assistance/impaired Eating/Feeding: Independent;Sitting   Grooming: Oral care;Standing;Contact guard assist Grooming Details (indicate cue type and reason): slight posterior LOB, pt used sink to catch himself Upper Body Bathing: Sitting;Set up   Lower Body Bathing: Sitting/lateral leans;Minimal assistance   Upper Body Dressing : Sitting;Set up   Lower Body Dressing: Sitting/lateral leans;Moderate assistance Lower Body Dressing Details (indicate cue type and reason): Wears slip on shoes at baseline Toilet Transfer: Rolling walker (2 wheels);Minimal assistance;Ambulation   Toileting- Clothing Manipulation and Hygiene: Sit to/from stand;Minimal assistance       Functional mobility during ADLs: Minimal assistance;Rolling walker (2 wheels) General ADL Comments: Min A for balance during ambulation, occasional posterior LOB     Vision         Perception         Praxis         Pertinent Vitals/Pain Pain Assessment Pain Assessment: No/denies pain  Extremity/Trunk Assessment Upper Extremity Assessment Upper Extremity Assessment: Overall WFL for tasks assessed   Lower Extremity Assessment Lower Extremity Assessment: Defer to PT evaluation       Communication Communication Communication: Hearing impairment Cueing Techniques: Verbal cues   Cognition Arousal:  Alert Behavior During Therapy: WFL for tasks assessed/performed Overall Cognitive Status: Within Functional Limits for tasks assessed                                       General Comments  VSS on RA, discussed with pt and son about going home with [facility] if the spouse can provide the level of support needed to prevent pt from losing balance    Exercises     Shoulder Instructions      Home Living Family/patient expects to be discharged to:: Other (Comment) ([facility])                                 Additional Comments: Stays with Wife at ALF, pt reports 2 falls in the last year ((1x from tripping and 1x from dizziness)      Prior Functioning/Environment Prior Level of Function : Independent/Modified Independent;History of Falls (last six months)             Mobility Comments: ind with RW ADLs Comments: ind        OT Problem List: Impaired balance (sitting and/or standing)      OT Treatment/Interventions: Self-care/ADL training;Balance training;Therapeutic exercise;Therapeutic activities;Patient/family education    OT Goals(Current goals can be found in the care plan section) Acute Rehab OT Goals Patient Stated Goal: To go home OT Goal Formulation: With patient Time For Goal Achievement: 04/30/23 Potential to Achieve Goals: Good  OT Frequency: Min 1X/week    Co-evaluation              AM-PAC OT "6 Clicks" Daily Activity     Outcome Measure Help from another person eating meals?: None Help from another person taking care of personal grooming?: A Little Help from another person toileting, which includes using toliet, bedpan, or urinal?: A Little Help from another person bathing (including washing, rinsing, drying)?: A Little Help from another person to put on and taking off regular upper body clothing?: A Little Help from another person to put on and taking off regular lower body clothing?: A Lot 6 Click Score: 18   End of  Session Equipment Utilized During Treatment: Gait belt;Rolling walker (2 wheels) Nurse Communication: Mobility status  Activity Tolerance: Patient tolerated treatment well Patient left: in bed;with call bell/phone within reach;with bed alarm set  OT Visit Diagnosis: Unsteadiness on feet (R26.81);Other abnormalities of gait and mobility (R26.89)                Time: 0017-4944 OT Time Calculation (min): 15 min Charges:  OT General Charges $OT Visit: 1 Visit OT Evaluation $OT Eval Low Complexity: 1 Low  04/16/2023  AB, OTR/L  Acute Rehabilitation Services  Office: [phone]   Tristan Schroeder 04/16/2023, 5:10 PM

## 2023-04-16 NOTE — Telephone Encounter (Signed)
The patient's wife answered and reports that the patient was admitted to the hospital due to worsening vital signs and labs?  She said that I could give him a call at the hospital. Informed her that I would let Dr Royann Shivers know that the patient is in the hospital at this time. She verbalized understanding.

## 2023-04-16 NOTE — Plan of Care (Signed)

## 2023-04-16 NOTE — Consult Note (Addendum)
WOC Nurse Consult Note: Reason for Consult: Consult requested for skin tear to left elbow. Pt fell prior to admission and has 2 areas of full thickness skin tears to left arm near elbow.  Lower wound 1X1X.1cm, skin is 90% approximated, 10% red and moist.  Above that, wound is 50% red and moist, 50% skin approximated, 4X1X.2cm.  Small amt bloody drainage from both sites.  Attempted to approximate skin over the locations as much as possible with 2 Steristrips.  Dressing procedure/placement/frequency: Topical treatment orders provided for bedside nurses to perform as follows:Leave steristrips in place until they fall off. Change xeroform gauze to left elbow wounds Q day and cover with foam dressing.  Change foam dressing Q 3 days or PRN soiling. Please re-consult if further assistance is needed.  Thank-you,  Cammie Mcgee MSN, RN, CWOCN, [place], CNS [phone]

## 2023-04-16 NOTE — Consult Note (Signed)
Palliative Care Consult Note                                  Date: 04/16/2023   Patient Name: Shaun Mclaughlin  DOB: 1935/04/25  MRN: [id number]  Age / Sex: 88 y.o., male  PCP: Tally Joe, MD Referring Physician: Glade Lloyd, MD  Reason for Consultation: Establishing goals of care  HPI/Patient Profile: 88 y.o. male  with past medical history of hypertension, hyperlipidemia, type 2 diabetes mellitus, hypothyroidism, PAF on Eliquis, CAD, and chronic HFpEF admitted on 04/15/2023 with lightheadedness and lab abnormalities.   On presentation, CT of the head and cervical spine showed no acute findings. CT of the chest revealed incidental thyroid and lung nodules. Creatinine was 3.45, magnesium of 0.6 and potassium of 6.2. He was treated with Lokelma, IV magnesium, IV calcium, insulin with dextrose and IV fluids.   Past Medical History:  Diagnosis Date   Acute on chronic combined systolic and diastolic congestive heart failure, NYHA class 2 (HCC) 06/29/2014   Arthritis    "maybe a little bit in some joints" (08/04/2012)   Atherosclerotic renal artery stenosis, unilateral (HCC)    lleft renal artery stenosis by duplex ultrasound   CAD (coronary artery disease),Hx RCA stenting-patent 06/2012  08/05/2012   Chronic anticoagulation, coumadin for PAF 08/05/2012   Chronic bronchitis (HCC)    "about q yr" (08/04/2012)   DM (diabetes mellitus) (HCC) 08/05/2012   External bleeding hemorrhoids ~ 2003; 2008; 2013   "removed polyps and no bleeding since" (08/04/2012)   GERD (gastroesophageal reflux disease)    Hip fracture (HCC) 10/2016   RIGHT FEMORAL NECK    HTN (hypertension) 08/05/2012   Hypercholesteremia    Hyperlipidemia 08/05/2012   Hyperthyroidism    Kidney stone 1980's   PAF (paroxysmal atrial fibrillation), maintaining SR 08/05/2012   Pneumonia ~ 2008   PVD (peripheral vascular disease) with claudication, Lt Upper ext. pain, and known  80-90%distal Lt. subclavian artery stenosis  08/05/2012   S/P angioplasty with stent to Lt. subclavian artery -Nitrinol stent 08/04/12 08/05/2012   Type II diabetes mellitus (HCC)    "take RX; I'm prediabetic; don't have to  check my CBG qd; keep my weight down" (08/04/2012)    Subjective:   I have reviewed medical records including EPIC notes, labs and imaging, assessed the patient and then met in the patient's room with the patient and his wife to discuss diagnosis prognosis, GOC, EOL wishes, disposition and options.  I introduced Palliative Medicine as specialized medical care for people living with serious illness. It focuses on providing relief from symptoms and stress of a serious illness. The goal is to improve quality of life for both the patient and the family.   Patient/Family Understanding of Illness: Patient and his wife understand his chronic illnesses and his acute illness during this hospitalization.  Social History: Patient and his wife have been married over 50 years. They had a blended family with five children but have lost two over the years. 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  Functional and Nutritional State: The patient has been living independently at [facility]. He uses a cane at times. His appetite has been declining over the last few months.  Today's Discussion: A discussion was had today regarding  advanced directives. The patient and his wife have completed advanced directives and [facility] has these. We discussed concepts specific to code status and scope of care. Recommended consideration of DNR status, understanding evidenced-based poor outcomes in similar hospitalized patients, as the cause of the arrest is likely associated with chronic/terminal disease rather than a reversible acute cardio-pulmonary event. Patient  would like code status changed to DNR. We discussed scope of care and patient would like limited scope with no intubation.  Discussed the importance of continued conversation with family and the medical providers regarding overall plan of care and treatment options, ensuring decisions are within the context of the patient's values and GOCs.  Questions and concerns were addressed. The family was encouraged to call with questions or concerns. PMT will continue to support holistically.  Review of Systems  Gastrointestinal:  Positive for diarrhea (prior to admission).  Neurological:  Positive for light-headedness (prior to admission).    Objective:   Primary Diagnoses: Present on Admission:  Hyperkalemia  Acute renal failure superimposed on stage 3a chronic kidney disease (HCC)  Hyperthyroidism  COPD (chronic obstructive pulmonary disease) (HCC)  Chronic heart failure with preserved ejection fraction (HFpEF) (HCC)  CAD,Hx RCA stent Jan '03. patent at cath 06/2012   Thyroid nodule  Lung nodule   Physical Exam Vitals reviewed.  HENT:     Head: Normocephalic and atraumatic.  Cardiovascular:     Rate and Rhythm: Normal rate.  Pulmonary:     Effort: Pulmonary effort is normal.  Skin:    General: Skin is warm and dry.     Comments: Left arm injury-bandage  Neurological:     Mental Status: He is alert and oriented to person, place, and time.  Psychiatric:        Mood and Affect: Mood normal.        Behavior: Behavior normal.        Thought Content: Thought content normal.        Judgment: Judgment normal.     Vital Signs:  BP 113/68   Pulse 91   Temp 97.9 F (36.6 C) (Oral)   Resp 20   Ht 5\' 6"  (1.676 m)   Wt 69 kg   SpO2 98%   BMI 24.55 kg/m    Advanced Care Planning:   Existing Vynca/ACP Documentation: None  Primary Decision Maker: PATIENT  Code Status/Advance Care Planning: Change code status to DNR/DNI.  Assessment & Plan:   SUMMARY OF RECOMMENDATIONS    Change code status to DNR Limited scope- DNI Treat the treatable Plan to discharge back to [facility] independent living PMT support as needed  Discussed with: bedside RN and Dr. Hanley Ben  Time Total: 60 minutes  Thank you for allowing [place] to participate in the care of Shaun Mclaughlin PMT will continue to support holistically.   Signed by: Sarina Ser, NP Palliative Medicine Team  Team Phone # [phone] (Nights/Weekends)  04/16/2023, 2:29 PM

## 2023-04-17 DIAGNOSIS — E875 Hyperkalemia: Secondary | ICD-10-CM | POA: Diagnosis not present

## 2023-04-17 LAB — MAGNESIUM: Magnesium: 2.3 mg/dL (ref 1.7–2.4)

## 2023-04-17 LAB — BASIC METABOLIC PANEL
Anion gap: 9 (ref 5–15)
BUN: 41 mg/dL — ABNORMAL HIGH (ref 8–23)
CO2: 22 mmol/L (ref 22–32)
Calcium: 7.6 mg/dL — ABNORMAL LOW (ref 8.9–10.3)
Chloride: 100 mmol/L (ref 98–111)
Creatinine, Ser: 2.13 mg/dL — ABNORMAL HIGH (ref 0.61–1.24)
GFR, Estimated: 30 mL/min — ABNORMAL LOW (ref >60)
Glucose, Bld: 173 mg/dL — ABNORMAL HIGH (ref 70–99)
Potassium: 5.5 mmol/L — ABNORMAL HIGH (ref 3.5–5.1)
Sodium: 131 mmol/L — ABNORMAL LOW (ref 135–145)

## 2023-04-17 LAB — CBC
HCT: 37.7 % — ABNORMAL LOW (ref 39.0–52.0)
Hemoglobin: 12.1 g/dL — ABNORMAL LOW (ref 13.0–17.0)
MCH: 27.9 pg (ref 26.0–34.0)
MCHC: 32.1 g/dL (ref 30.0–36.0)
MCV: 86.9 fL (ref 80.0–100.0)
Platelets: 201 10*3/uL (ref 150–400)
RBC: 4.34 MIL/uL (ref 4.22–5.81)
RDW: 18.2 % — ABNORMAL HIGH (ref 11.5–15.5)
WBC: 6.8 10*3/uL (ref 4.0–10.5)
nRBC: 0 % (ref 0.0–0.2)

## 2023-04-17 LAB — GLUCOSE, CAPILLARY
Glucose-Capillary: 162 mg/dL — ABNORMAL HIGH (ref 70–99)
Glucose-Capillary: 133 mg/dL — ABNORMAL HIGH (ref 70–99)
Glucose-Capillary: 130 mg/dL — ABNORMAL HIGH (ref 70–99)
Glucose-Capillary: 194 mg/dL — ABNORMAL HIGH (ref 70–99)

## 2023-04-17 MED ORDER — SODIUM CHLORIDE 0.9 % IV SOLN: INTRAVENOUS | Status: AC

## 2023-04-17 MED ORDER — DICLOFENAC SODIUM 1 % EX GEL
2.0000 g | Freq: Four times a day (QID) | CUTANEOUS | Status: DC
  Administered 2023-04-17 – 2023-04-19 (×10): 2 g via TOPICAL
  Filled 2023-04-17: qty 100

## 2023-04-17 MED ORDER — SODIUM ZIRCONIUM CYCLOSILICATE 10 G PO PACK
10.0000 g | PACK | Freq: Once | ORAL | Status: AC
  Administered 2023-04-17: 10 g via ORAL
  Filled 2023-04-17: qty 1

## 2023-04-17 NOTE — Progress Notes (Signed)
PROGRESS NOTE    JYREN ASCHE  ZOX:[id number] DOB: 02-19-1935 DOA: 04/15/2023 PCP: Tally Joe, MD   Brief Narrative:  88 y.o. male with medical history significant for hypertension, hyperlipidemia, type 2 diabetes mellitus, hypothyroidism, PAF on Eliquis, CAD, and chronic HFpEF presented with lightheadedness and lab abnormalities (renal failure and hyperkalemia on outpatient blood work).  On presentation, CT of the head and cervical spine showed no acute findings.  CT of the chest revealed incidental thyroid and lung nodules.  Creatinine was 3.45, magnesium of 0.6 and potassium of 6.2.  He was treated with Lokelma, IV magnesium, IV calcium, insulin with dextrose and IV fluids.  Cardiology and palliative care were consulted.  Assessment & Plan:   AKI on CKD stage IIIa Hyperkalemia -Baseline creatinine of 1.2-1.6.  Presented with creatinine of 3.45 and potassium of 6.2. -Creatinine 2.13 today.  Decrease IV fluids to 75 cc an hour.  Repeat a.m. labs.   -Potassium 5.5 today.  Will give 1 dose of Lokelma.   -Renal ultrasound negative for hydronephrosis. -If renal function worsens, will get nephrology evaluation.  Losartan and Lasix on hold.  Hyponatremia -Mild.  Monitor.  Hypomagnesemia -Improved  Chronic diastolic heart failure Hypertension Hyperlipidemia -Losartan and Lasix on hold.  Strict input and output.  Daily weights.  Continue metoprolol and statin.  Cardiology following  Paroxysmal A-fib -Rate controlled.  Continue Eliquis  CAD -Stable.  No anginal symptoms.  Continue aspirin, metoprolol  Hyperthyroidism -Continue propylthiouracil.  Diabetes mellitus type 2 -Metformin on hold.  Continue CBGs with SSI  COPD -Stable.  Continue albuterol as needed  Thyroid and lung nodules -Noted incidentally on CT in the ED.  Outpatient follow-up  Physical deconditioning -PT eval  Goals of care -Palliative care evaluation appreciated.  CODE STATUS has been changed to DNR  by palliative care team  DVT prophylaxis: Eliquis Code Status: DNR Family Communication: None at bedside Disposition Plan: Status is: Inpatient Remains inpatient appropriate because: Of severity of illness    Consultants: cardiology and palliative care  Procedures: None  Antimicrobials: None   Subjective: Patient seen and examined at bedside.  No fever, vomiting, chest pain or worsening shortness of breath reported.  Feels slightly better. Continues to feel weak. Objective: Vitals:   04/16/23 0626 04/16/23 0849 04/16/23 1724 04/16/23 2126  BP: 128/67 113/68 (!) 115/53 123/80  Pulse: 90 91 88 93  Resp:   17   Temp: 98.2 F (36.8 C) 97.9 F (36.6 C) 98.1 F (36.7 C) 98.1 F (36.7 C)  TempSrc: Oral Oral Oral Oral  SpO2: 97% 98% 100% 98%  Weight:      Height:        Intake/Output Summary (Last 24 hours) at 04/17/2023 0758 Last data filed at 04/17/2023 0000 Gross per 24 hour  Intake 360 ml  Output 1250 ml  Net -890 ml   Filed Weights   04/15/23 1704  Weight: 69 kg    Examination:  General: Currently on room air.  No distress.  Elderly male lying in bed. ENT/neck: No thyromegaly.  JVD is not elevated  respiratory: Decreased breath sounds at bases bilaterally with some crackles; no wheezing  CVS: S1-S2 heard, rate controlled currently Abdominal: Soft, nontender, slightly distended; no organomegaly, bowel sounds are heard Extremities: Trace lower extremity edema; no cyanosis  CNS: Awake and alert.  Slow to respond.  No focal neurologic deficit.  Moves extremities Lymph: No obvious lymphadenopathy Skin: No obvious ecchymosis/lesions  psych: Currently not agitated.  Affect is mostly flat.  musculoskeletal: No obvious joint swelling/deformity    Data Reviewed: I have personally reviewed following labs and imaging studies  CBC: Recent Labs  Lab 04/15/23 1710 04/16/23 0910 04/17/23 0548  WBC 7.0 5.8 6.8  NEUTROABS 6.2  --   --   HGB 13.6 12.2* 12.1*  HCT  41.4 36.8* 37.7*  MCV 88.7 86.0 86.9  PLT 263 221 201   Basic Metabolic Panel: Recent Labs  Lab 04/15/23 1710 04/16/23 0120 04/16/23 0910 04/17/23 0548  NA 129*  --  130* 131*  K 6.2*  --  4.3 5.5*  CL 91*  --  98 100  CO2 20*  --  19* 22  GLUCOSE 116* 86 134* 173*  BUN 65*  --  54* 41*  CREATININE 3.45*  --  2.52* 2.13*  CALCIUM 7.3*  --  7.1* 7.6*  MG 0.6*  --  1.2* 2.3  PHOS  --   --  4.4  --    GFR: Estimated Creatinine Clearance: 22.5 mL/min (A) (by C-G formula based on SCr of 2.13 mg/dL (H)). Liver Function Tests: Recent Labs  Lab 04/15/23 1710  AST 27  ALT 19  ALKPHOS 92  BILITOT 1.3*  PROT 8.9*  ALBUMIN 3.6   No results for input(s): "LIPASE", "AMYLASE" in the last 168 hours. No results for input(s): "AMMONIA" in the last 168 hours. Coagulation Profile: No results for input(s): "INR", "PROTIME" in the last 168 hours. Cardiac Enzymes: No results for input(s): "CKTOTAL", "CKMB", "CKMBINDEX", "TROPONINI" in the last 168 hours. BNP (last 3 results) No results for input(s): "PROBNP" in the last 8760 hours. HbA1C: Recent Labs    04/16/23 0910  HGBA1C 7.2*   CBG: Recent Labs  Lab 04/16/23 0721 04/16/23 1121 04/16/23 1721 04/16/23 2128 04/17/23 0735  GLUCAP 103* 107* 141* 171* 162*   Lipid Profile: No results for input(s): "CHOL", "HDL", "LDLCALC", "TRIG", "CHOLHDL", "LDLDIRECT" in the last 72 hours. Thyroid Function Tests: No results for input(s): "TSH", "T4TOTAL", "FREET4", "T3FREE", "THYROIDAB" in the last 72 hours. Anemia Panel: No results for input(s): "VITAMINB12", "FOLATE", "FERRITIN", "TIBC", "IRON", "RETICCTPCT" in the last 72 hours. Sepsis Labs: No results for input(s): "PROCALCITON", "LATICACIDVEN" in the last 168 hours.  No results found for this or any previous visit (from the past 240 hour(s)).       Radiology Studies: US RENAL  Result Date: 04/15/2023 CLINICAL DATA:  Acute on chronic renal failure EXAM: RENAL / URINARY TRACT  ULTRASOUND COMPLETE COMPARISON:  No MRI abdomen dated 05/27/2018 FINDINGS: Right Kidney: Renal measurements: 8.0 x 3.6 x 4.1 cm = volume: 62.4 mL. Echogenicity within normal limits. 2.0 x 1.2 x 1.8 cm interpolar simple cyst. No hydronephrosis. Left Kidney: Renal measurements: 9.7 x 5.8 x 4.1 cm = volume: 119.5 mL. Echogenicity within normal limits. 6 mm interpolar calculus. No hydronephrosis. Bladder: Appears normal for degree of bladder distention. Other: None. IMPRESSION: 6 mm left renal calculus.  No hydronephrosis. 2.0 cm interpolar right renal simple cysts, benign. No follow-up recommended. Electronically Signed   By: Charline Bills M.D.   On: 04/15/2023 22:54   CT Chest Wo Contrast  Result Date: 04/15/2023 CLINICAL DATA:  Chest trauma EXAM: CT CHEST WITHOUT CONTRAST TECHNIQUE: Multidetector CT imaging of the chest was performed following the standard protocol without IV contrast. RADIATION DOSE REDUCTION: This exam was performed according to the departmental dose-optimization program which includes automated exposure control, adjustment of the mA and/or kV according to patient size and/or use of iterative reconstruction technique. COMPARISON:  CT  of the chest 06/18/2018 FINDINGS: Cardiovascular: No significant vascular findings. Normal heart size. No pericardial effusion. There are atherosclerotic calcifications of the aorta and coronary arteries. Mediastinum/Nodes: Thyroid gland is heterogeneous. Thyroid nodules are present measuring up to 18 mm on the left. There are no enlarged mediastinal or hilar lymph nodes. The esophagus is within normal limits. Lungs/Pleura: There is stable mild smooth pleural thickening on the right with a small right pleural effusion, partially loculated. Slightly ovoid airspace opacities are seen in the right lower lobe and right middle lobe anteriorly which may represent rounded atelectasis, although other etiologies are not excluded. Findings are stable from prior. There is  some stable scarring in the lateral right mid lung and right upper lobe. There is a single new peripheral nodule in the right upper lobe measuring 4 mm image 4/54. There is no pneumothorax. There some calcified granulomas in the left upper lobe. Upper Abdomen: Gallstones are present. Severe atherosclerotic calcifications are present. No acute findings. Musculoskeletal: The bones are osteopenic. There is trace chronic compression deformity of T7, unchanged. No acute fractures are seen. IMPRESSION: 1. No acute posttraumatic sequelae in the chest. 2. Stable small partially loculated right pleural effusion. 3. Stable airspace disease in the right lower lobe and right middle lobe favored as rounded atelectasis. 4. Incidental left thyroid nodule measuring 1.8 cm. Recommend non-emergent thyroid ultrasound if clinically warranted given patient age. Reference: J Am Coll Radiol. 2015 Feb;12(2): 143-50 5. New solid pulmonary nodule within the upper lobe measuring 4 mm. Per Fleischner Society Guidelines, if patient is low risk for malignancy, no routine follow-up imaging is recommended. If patient is high risk for malignancy, a non-contrast Chest CT at 12 months is optional. If performed and the nodule is stable at 12 months, no further follow-up is recommended. These guidelines do not apply to immunocompromised patients and patients with cancer. Follow up in patients with significant comorbidities as clinically warranted. For lung cancer screening, adhere to Lung-RADS guidelines. Reference: Radiology. 2017; 284(1):228-43. 6. Cholelithiasis. Aortic Atherosclerosis (ICD10-I70.0). Electronically Signed   By: Darliss Cheney M.D.   On: 04/15/2023 21:02   CT Head Wo Contrast  Result Date: 04/15/2023 CLINICAL DATA:  Recent fall with headaches and neck pain, initial encounter EXAM: CT HEAD WITHOUT CONTRAST CT CERVICAL SPINE WITHOUT CONTRAST TECHNIQUE: Multidetector CT imaging of the head and cervical spine was performed following  the standard protocol without intravenous contrast. Multiplanar CT image reconstructions of the cervical spine were also generated. RADIATION DOSE REDUCTION: This exam was performed according to the departmental dose-optimization program which includes automated exposure control, adjustment of the mA and/or kV according to patient size and/or use of iterative reconstruction technique. COMPARISON:  None Available. FINDINGS: CT HEAD FINDINGS Brain: No evidence of acute infarction, hemorrhage, hydrocephalus, extra-axial collection or mass lesion/mass effect. Chronic atrophic and ischemic changes are noted. Small lacunar infarct is noted within the left thalamus. 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 Vascular: No hyperdense vessel or unexpected calcification. Skull: Normal. Negative for fracture or focal lesion. Sinuses/Orbits: No acute finding. Other: None. CT CERVICAL SPINE FINDINGS Alignment: Within normal limits. Skull base and vertebrae: 7 cervical segments are well visualized. Vertebral body height is well maintained. Multilevel disc space narrowing and facet hypertrophic changes are noted. No acute fracture or acute facet abnormality is noted. The odontoid is within normal limits. Soft tissues and spinal canal: Surrounding soft tissue structures are within normal limits. Upper chest: Visualized lung apices are unremarkable. Other: None IMPRESSION: CT of the head: Chronic atrophic  and ischemic changes as described. CT of the cervical spine: Multilevel degenerative change without acute abnormality. Electronically Signed   By: Alcide Clever M.D.   On: 04/15/2023 20:49   CT Cervical Spine Wo Contrast  Result Date: 04/15/2023 CLINICAL DATA:  Recent fall with headaches and neck pain, initial encounter EXAM: CT HEAD WITHOUT CONTRAST CT CERVICAL SPINE WITHOUT CONTRAST TECHNIQUE: Multidetector CT imaging of the head and cervical spine was performed following the standard protocol without intravenous contrast.  Multiplanar CT image reconstructions of the cervical spine were also generated. RADIATION DOSE REDUCTION: This exam was performed according to the departmental dose-optimization program which includes automated exposure control, adjustment of the mA and/or kV according to patient size and/or use of iterative reconstruction technique. COMPARISON:  None Available. FINDINGS: CT HEAD FINDINGS Brain: No evidence of acute infarction, hemorrhage, hydrocephalus, extra-axial collection or mass lesion/mass effect. Chronic atrophic and ischemic changes are noted. Small lacunar infarct is noted within the left thalamus. 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 Vascular: No hyperdense vessel or unexpected calcification. Skull: Normal. Negative for fracture or focal lesion. Sinuses/Orbits: No acute finding. Other: None. CT CERVICAL SPINE FINDINGS Alignment: Within normal limits. Skull base and vertebrae: 7 cervical segments are well visualized. Vertebral body height is well maintained. Multilevel disc space narrowing and facet hypertrophic changes are noted. No acute fracture or acute facet abnormality is noted. The odontoid is within normal limits. Soft tissues and spinal canal: Surrounding soft tissue structures are within normal limits. Upper chest: Visualized lung apices are unremarkable. Other: None IMPRESSION: CT of the head: Chronic atrophic and ischemic changes as described. CT of the cervical spine: Multilevel degenerative change without acute abnormality. Electronically Signed   By: Alcide Clever M.D.   On: 04/15/2023 20:49   DG Chest 1 View  Result Date: 04/15/2023 CLINICAL DATA:  Fall, abnormal potassium EXAM: CHEST  1 VIEW COMPARISON:  11/10/2019 FINDINGS: Volume loss in the right hemithorax with right lower lobe scarring/atelectasis and a small right pleural effusion, chronic. Left lung is clear. The heart is top-normal in size. IMPRESSION: Right lower lobe scarring/atelectasis with a small right pleural effusion,  chronic. Electronically Signed   By: Charline Bills M.D.   On: 04/15/2023 19:54        Scheduled Meds:  apixaban  2.5 mg Oral BID   atorvastatin  40 mg Oral Daily   insulin aspart  0-5 Units Subcutaneous QHS   insulin aspart  0-6 Units Subcutaneous TID WC   latanoprost  1 drop Both Eyes q morning   metoprolol tartrate  25 mg Oral BID   propylthiouracil  50 mg Oral Daily   sodium chloride flush  3 mL Intravenous Q12H   Continuous Infusions:          Glade Lloyd, MD [facility] 04/17/2023, 7:58 AM

## 2023-04-17 NOTE — Evaluation (Signed)
Physical Therapy Evaluation Patient Details Name: Shaun Mclaughlin MRN: [id number] DOB: September 30, 1934 Today's Date: 04/17/2023  History of Present Illness  88 y.o. male presents to [facility] hospital on 04/15/2023 with lightheadedness, hyperkalemia. PMH includes HTN, HLD, DMII, hypothyroidism, PAF, CAD, chronic HFpEF.  Clinical Impression  Pt presents to PT with deficits in gait, balance, endurance, power. Pt is able to ambulate at this time but demonstrates instability when mobilizing without UE support. Pt demonstrates much improved stability with UE support of rollator at this time. Pt will benefit from frequent mobilization during admission to improve strength and endurance. PT recommends discharge home with rollator when medically appropriate.        If plan is discharge home, recommend the following: A little help with bathing/dressing/bathroom;Assistance with cooking/housework;Help with stairs or ramp for entrance   Can travel by private vehicle        Equipment Recommendations Rollator (4 wheels)  Recommendations for Other Services       Functional Status Assessment Patient has had a recent decline in their functional status and demonstrates the ability to make significant improvements in function in a reasonable and predictable amount of time.     Precautions / Restrictions Precautions Precautions: Fall Precaution Comments: 2 recent falls Restrictions Weight Bearing Restrictions: No      Mobility  Bed Mobility Overal bed mobility: Needs Assistance Bed Mobility: Supine to Sit     Supine to sit: Supervision          Transfers Overall transfer level: Needs assistance Equipment used: None, Rollator (4 wheels) Transfers: Sit to/from Stand Sit to Stand: Contact guard assist (CGA without UE support)                Ambulation/Gait Ambulation/Gait assistance: Supervision, Contact guard assist Gait Distance (Feet): 250 Feet (100' with railing, 150' with  rollator) Assistive device: Rollator (4 wheels) (railing) Gait Pattern/deviations: Step-through pattern Gait velocity: reduced Gait velocity interpretation: 1.31 - 2.62 ft/sec, indicative of limited community ambulator   General Gait Details: pt with increased lateral sway and shortened stride length with use of railing, increased stride length and stability with UE support of Rollator  Stairs            Wheelchair Mobility     Tilt Bed    Modified Rankin (Stroke Patients Only)       Balance Overall balance assessment: Needs assistance Sitting-balance support: No upper extremity supported, Feet supported Sitting balance-Leahy Scale: Good     Standing balance support: Single extremity supported, Reliant on assistive device for balance Standing balance-Leahy Scale: Poor                               Pertinent Vitals/Pain Pain Assessment Pain Assessment: 0-10 Pain Score: 5  Pain Location: R knee Pain Descriptors / Indicators: Aching Pain Intervention(s): Monitored during session    Home Living Family/patient expects to be discharged to:: Other (Comment)                   Additional Comments: [facility]    Prior Function Prior Level of Function : Independent/Modified Independent             Mobility Comments: ambulates with UE support of SPC and use of railing in hallway ADLs Comments: independent     Extremity/Trunk Assessment   Upper Extremity Assessment Upper Extremity Assessment: Overall WFL for tasks assessed    Lower Extremity Assessment Lower Extremity Assessment:  Generalized weakness    Cervical / Trunk Assessment Cervical / Trunk Assessment: Kyphotic  Communication   Communication Communication: Hearing impairment Cueing Techniques: Verbal cues  Cognition Arousal: Alert Behavior During Therapy: WFL for tasks assessed/performed Overall Cognitive Status: Within Functional Limits for tasks assessed                                           General Comments General comments (skin integrity, edema, etc.): VSS on RA    Exercises     Assessment/Plan    PT Assessment Patient needs continued PT services  PT Problem List Decreased strength;Decreased activity tolerance;Decreased balance;Decreased mobility;Decreased knowledge of use of DME;Decreased safety awareness       PT Treatment Interventions DME instruction;Gait training;Functional mobility training;Therapeutic activities;Therapeutic exercise;Balance training;Neuromuscular re-education;Patient/family education    PT Goals (Current goals can be found in the Care Plan section)  Acute Rehab PT Goals Patient Stated Goal: to return home PT Goal Formulation: With patient Time For Goal Achievement: 05/01/23 Potential to Achieve Goals: Good    Frequency Min 1X/week     Co-evaluation               AM-PAC PT "6 Clicks" Mobility  Outcome Measure Help needed turning from your back to your side while in a flat bed without using bedrails?: A Little Help needed moving from lying on your back to sitting on the side of a flat bed without using bedrails?: A Little Help needed moving to and from a bed to a chair (including a wheelchair)?: A Little Help needed standing up from a chair using your arms (e.g., wheelchair or bedside chair)?: A Little Help needed to walk in hospital room?: A Little Help needed climbing 3-5 steps with a railing? : A Lot 6 Click Score: 17    End of Session Equipment Utilized During Treatment: Gait belt Activity Tolerance: Patient tolerated treatment well Patient left: in chair;with call bell/phone within reach Nurse Communication: Mobility status PT Visit Diagnosis: Other abnormalities of gait and mobility (R26.89);Muscle weakness (generalized) (M62.81)    Time: 6962-9528 PT Time Calculation (min) (ACUTE ONLY): 18 min   Charges:   PT Evaluation $PT Eval Low Complexity: 1 Low   PT General  Charges $$ ACUTE PT VISIT: 1 Visit         Arlyss Gandy, PT, DPT Acute Rehabilitation Office [phone]   Arlyss Gandy 04/17/2023, 12:52 PM

## 2023-04-18 DIAGNOSIS — E875 Hyperkalemia: Secondary | ICD-10-CM | POA: Diagnosis not present

## 2023-04-18 LAB — BASIC METABOLIC PANEL
Anion gap: 11 (ref 5–15)
BUN: 29 mg/dL — ABNORMAL HIGH (ref 8–23)
CO2: 21 mmol/L — ABNORMAL LOW (ref 22–32)
Calcium: 8.4 mg/dL — ABNORMAL LOW (ref 8.9–10.3)
Chloride: 101 mmol/L (ref 98–111)
Creatinine, Ser: 1.35 mg/dL — ABNORMAL HIGH (ref 0.61–1.24)
GFR, Estimated: 51 mL/min — ABNORMAL LOW (ref >60)
Glucose, Bld: 125 mg/dL — ABNORMAL HIGH (ref 70–99)
Potassium: 4.4 mmol/L (ref 3.5–5.1)
Sodium: 133 mmol/L — ABNORMAL LOW (ref 135–145)

## 2023-04-18 LAB — CBC
HCT: 37.2 % — ABNORMAL LOW (ref 39.0–52.0)
Hemoglobin: 12 g/dL — ABNORMAL LOW (ref 13.0–17.0)
MCH: 28.8 pg (ref 26.0–34.0)
MCHC: 32.3 g/dL (ref 30.0–36.0)
MCV: 89.2 fL (ref 80.0–100.0)
Platelets: 230 10*3/uL (ref 150–400)
RBC: 4.17 MIL/uL — ABNORMAL LOW (ref 4.22–5.81)
RDW: 19 % — ABNORMAL HIGH (ref 11.5–15.5)
WBC: 9 10*3/uL (ref 4.0–10.5)
nRBC: 0 % (ref 0.0–0.2)

## 2023-04-18 LAB — GLUCOSE, CAPILLARY
Glucose-Capillary: 131 mg/dL — ABNORMAL HIGH (ref 70–99)
Glucose-Capillary: 122 mg/dL — ABNORMAL HIGH (ref 70–99)
Glucose-Capillary: 133 mg/dL — ABNORMAL HIGH (ref 70–99)
Glucose-Capillary: 186 mg/dL — ABNORMAL HIGH (ref 70–99)

## 2023-04-18 LAB — MAGNESIUM: Magnesium: 1.7 mg/dL (ref 1.7–2.4)

## 2023-04-18 LAB — UREA NITROGEN, URINE: Urea Nitrogen, Ur: 382 mg/dL

## 2023-04-18 NOTE — Progress Notes (Signed)
PROGRESS NOTE    Shaun Mclaughlin  XBM:[id number] DOB: June 09, 1935 DOA: 04/15/2023 PCP: Tally Joe, MD   Brief Narrative:  88 y.o. male with medical history significant for hypertension, hyperlipidemia, type 2 diabetes mellitus, hypothyroidism, PAF on Eliquis, CAD, and chronic HFpEF presented with lightheadedness and lab abnormalities (renal failure and hyperkalemia on outpatient blood work).  On presentation, CT of the head and cervical spine showed no acute findings.  CT of the chest revealed incidental thyroid and lung nodules.  Creatinine was 3.45, magnesium of 0.6 and potassium of 6.2.  He was treated with Lokelma, IV magnesium, IV calcium, insulin with dextrose and IV fluids.  Cardiology and palliative care were consulted.  Assessment & Plan:   AKI on CKD stage IIIa Hyperkalemia -Baseline creatinine of 1.2-1.6.  Presented with creatinine of 3.45 and potassium of 6.2. -Creatinine 2.13 on 04/17/2023.  Off IV fluids.  Labs pending today.  Monitor. -Potassium 5.5 on 04/17/2023 and received a dose of Lokelma.  Labs pending today.  Monitor.    -Renal ultrasound negative for hydronephrosis. -If renal function worsens, will get nephrology evaluation.  Losartan and Lasix on hold.  Hyponatremia -Mild.  Monitor.  Hypomagnesemia -Improved  Chronic diastolic heart failure Hypertension Hyperlipidemia -Losartan and Lasix on hold.  Strict input and output.  Daily weights.  Continue metoprolol and statin.  Cardiology evaluation appreciated.  Paroxysmal A-fib -Tachycardic this morning with rates going up to 130s.  Continue metoprolol.  Continue Eliquis  CAD -Stable.  No anginal symptoms.  Continue aspirin, metoprolol  Hyperthyroidism -Continue propylthiouracil.  Diabetes mellitus type 2 -Metformin on hold.  Continue CBGs with SSI  COPD -Stable.  Continue albuterol as needed  Thyroid and lung nodules -Noted incidentally on CT in the ED.  Outpatient follow-up  Physical  deconditioning -PT eval  Goals of care -Palliative care evaluation appreciated.  CODE STATUS has been changed to DNR by palliative care team  DVT prophylaxis: Eliquis Code Status: DNR Family Communication: None at bedside Disposition Plan: Status is: Inpatient Remains inpatient appropriate because: Of severity of illness.  Possible discharge in 1 to 2 days if improves clinically    Consultants: cardiology and palliative care  Procedures: None  Antimicrobials: None   Subjective: Patient seen and examined at bedside.  Denies worsening chest pain, shortness of breath or palpitations.  No fever or vomiting reported.   Objective: Vitals:   04/16/23 2126 04/17/23 1615 04/17/23 1951 04/18/23 0410  BP: 123/80 (!) 146/68 (!) 151/73 (!) 153/78  Pulse: 93 (!) 101 (!) 101 87  Resp:  18 17 15   Temp: 98.1 F (36.7 C) 98.1 F (36.7 C) 98.5 F (36.9 C) 97.7 F (36.5 C)  TempSrc: Oral Oral Oral Oral  SpO2: 98% 100% 99% 98%  Weight:      Height:        Intake/Output Summary (Last 24 hours) at 04/18/2023 0918 Last data filed at 04/18/2023 0843 Gross per 24 hour  Intake 840 ml  Output 1525 ml  Net -685 ml   Filed Weights   04/15/23 1704  Weight: 69 kg    Examination:  General: No acute distress.  On room air currently.  Elderly male lying in bed. ENT/neck: No obvious neck masses or JVD elevation noted respiratory: Bilateral decreased but sounds at bases with some crackles CVS: Intermittently tachycardic; S1 and S2 are heard abdominal: Soft, nontender, distended mildly; no organomegaly, bowel sounds are heard normally Extremities: No clubbing; mild lower extremity edema present CNS: Alert and oriented.  Still  slow to respond.  No focal neurologic deficit.  Able to move extremities  lymph: No palpable lymphadenopathy Skin: No obvious petechiae/rashes psych: Mostly flat affect.  Not agitated currently.  Musculoskeletal: No obvious joint erythema/tenderness    Data Reviewed: I  have personally reviewed following labs and imaging studies  CBC: Recent Labs  Lab 04/15/23 1710 04/16/23 0910 04/17/23 0548  WBC 7.0 5.8 6.8  NEUTROABS 6.2  --   --   HGB 13.6 12.2* 12.1*  HCT 41.4 36.8* 37.7*  MCV 88.7 86.0 86.9  PLT 263 221 201   Basic Metabolic Panel: Recent Labs  Lab 04/15/23 1710 04/16/23 0120 04/16/23 0910 04/17/23 0548  NA 129*  --  130* 131*  K 6.2*  --  4.3 5.5*  CL 91*  --  98 100  CO2 20*  --  19* 22  GLUCOSE 116* 86 134* 173*  BUN 65*  --  54* 41*  CREATININE 3.45*  --  2.52* 2.13*  CALCIUM 7.3*  --  7.1* 7.6*  MG 0.6*  --  1.2* 2.3  PHOS  --   --  4.4  --    GFR: Estimated Creatinine Clearance: 22.5 mL/min (A) (by C-G formula based on SCr of 2.13 mg/dL (H)). Liver Function Tests: Recent Labs  Lab 04/15/23 1710  AST 27  ALT 19  ALKPHOS 92  BILITOT 1.3*  PROT 8.9*  ALBUMIN 3.6   No results for input(s): "LIPASE", "AMYLASE" in the last 168 hours. No results for input(s): "AMMONIA" in the last 168 hours. Coagulation Profile: No results for input(s): "INR", "PROTIME" in the last 168 hours. Cardiac Enzymes: No results for input(s): "CKTOTAL", "CKMB", "CKMBINDEX", "TROPONINI" in the last 168 hours. BNP (last 3 results) No results for input(s): "PROBNP" in the last 8760 hours. HbA1C: Recent Labs    04/16/23 0910  HGBA1C 7.2*   CBG: Recent Labs  Lab 04/17/23 0735 04/17/23 1116 04/17/23 1614 04/17/23 2039 04/18/23 0712  GLUCAP 162* 133* 130* 194* 131*   Lipid Profile: No results for input(s): "CHOL", "HDL", "LDLCALC", "TRIG", "CHOLHDL", "LDLDIRECT" in the last 72 hours. Thyroid Function Tests: No results for input(s): "TSH", "T4TOTAL", "FREET4", "T3FREE", "THYROIDAB" in the last 72 hours. Anemia Panel: No results for input(s): "VITAMINB12", "FOLATE", "FERRITIN", "TIBC", "IRON", "RETICCTPCT" in the last 72 hours. Sepsis Labs: No results for input(s): "PROCALCITON", "LATICACIDVEN" in the last 168 hours.  No results  found for this or any previous visit (from the past 240 hour(s)).       Radiology Studies: No results found.      Scheduled Meds:  apixaban  2.5 mg Oral BID   atorvastatin  40 mg Oral Daily   diclofenac Sodium  2 g Topical QID   insulin aspart  0-5 Units Subcutaneous QHS   insulin aspart  0-6 Units Subcutaneous TID WC   latanoprost  1 drop Both Eyes q morning   metoprolol tartrate  25 mg Oral BID   propylthiouracil  50 mg Oral Daily   sodium chloride flush  3 mL Intravenous Q12H   Continuous Infusions:          Glade Lloyd, MD [facility] 04/18/2023, 9:18 AM

## 2023-04-19 DIAGNOSIS — E875 Hyperkalemia: Secondary | ICD-10-CM | POA: Diagnosis not present

## 2023-04-19 LAB — BASIC METABOLIC PANEL
Anion gap: 9 (ref 5–15)
BUN: 32 mg/dL — ABNORMAL HIGH (ref 8–23)
CO2: 21 mmol/L — ABNORMAL LOW (ref 22–32)
Calcium: 8.4 mg/dL — ABNORMAL LOW (ref 8.9–10.3)
Chloride: 99 mmol/L (ref 98–111)
Creatinine, Ser: 1.47 mg/dL — ABNORMAL HIGH (ref 0.61–1.24)
GFR, Estimated: 46 mL/min — ABNORMAL LOW (ref >60)
Glucose, Bld: 132 mg/dL — ABNORMAL HIGH (ref 70–99)
Potassium: 4.1 mmol/L (ref 3.5–5.1)
Sodium: 129 mmol/L — ABNORMAL LOW (ref 135–145)

## 2023-04-19 LAB — CBC WITH DIFFERENTIAL/PLATELET
Abs Immature Granulocytes: 0.37 10*3/uL — ABNORMAL HIGH (ref 0.00–0.07)
Basophils Absolute: 0.2 10*3/uL — ABNORMAL HIGH (ref 0.0–0.1)
Basophils Relative: 2 %
Eosinophils Absolute: 0.1 10*3/uL (ref 0.0–0.5)
Eosinophils Relative: 2 %
HCT: 36.3 % — ABNORMAL LOW (ref 39.0–52.0)
Hemoglobin: 11.9 g/dL — ABNORMAL LOW (ref 13.0–17.0)
Immature Granulocytes: 5 %
Lymphocytes Relative: 7 %
Lymphs Abs: 0.5 10*3/uL — ABNORMAL LOW (ref 0.7–4.0)
MCH: 29 pg (ref 26.0–34.0)
MCHC: 32.8 g/dL (ref 30.0–36.0)
MCV: 88.5 fL (ref 80.0–100.0)
Monocytes Absolute: 0.9 10*3/uL (ref 0.1–1.0)
Monocytes Relative: 11 %
Neutro Abs: 6.1 10*3/uL (ref 1.7–7.7)
Neutrophils Relative %: 73 %
Platelets: 207 10*3/uL (ref 150–400)
RBC: 4.1 MIL/uL — ABNORMAL LOW (ref 4.22–5.81)
RDW: 18.9 % — ABNORMAL HIGH (ref 11.5–15.5)
WBC: 8.2 10*3/uL (ref 4.0–10.5)
nRBC: 0 % (ref 0.0–0.2)

## 2023-04-19 LAB — GLUCOSE, CAPILLARY
Glucose-Capillary: 124 mg/dL — ABNORMAL HIGH (ref 70–99)
Glucose-Capillary: 144 mg/dL — ABNORMAL HIGH (ref 70–99)

## 2023-04-19 LAB — MAGNESIUM: Magnesium: 1.5 mg/dL — ABNORMAL LOW (ref 1.7–2.4)

## 2023-04-19 MED ORDER — MAGNESIUM SULFATE 2 GM/50ML IV SOLN
2.0000 g | Freq: Once | INTRAVENOUS | Status: AC
  Administered 2023-04-19: 2 g via INTRAVENOUS
  Filled 2023-04-19: qty 50

## 2023-04-19 NOTE — Progress Notes (Signed)
    Durable Medical Equipment  (From admission, onward)           Start     Ordered   04/19/23 1105  For home use only DME 4 wheeled rolling walker with seat  Once       Question:  Patient needs a walker to treat with the following condition  Answer:  Gait instability   04/19/23 1105

## 2023-04-19 NOTE — Plan of Care (Signed)

## 2023-04-19 NOTE — Care Management Important Message (Signed)
Important Message  Patient Details  Name: Shaun Mclaughlin MRN: [id number] Date of Birth: [birth year]   Important Message Given:  Yes - Medicare IM     Dorena Bodo 04/19/2023, 3:52 PM

## 2023-04-19 NOTE — Discharge Summary (Signed)
Physician Discharge Summary  Shaun Mclaughlin QMV:[id number] DOB: 01/03/1935 DOA: 04/15/2023  PCP: Tally Joe, MD  Admit date: 04/15/2023 Discharge date: 04/19/2023  Admitted From: Independent living facility Disposition: Independent living facility  Recommendations for Outpatient Follow-up:  Follow up with PCP in 1 week with repeat CBC/BMP Outpatient follow-up with cardiology Follow up in ED if symptoms worsen or new appear   Home Health: Home health OT Equipment/Devices: None  Discharge Condition: Stable CODE STATUS: DNR Diet recommendation: Heart healthy/fluid restriction of up to 1500 cc a day  Brief/Interim Summary: 88 y.o. male with medical history significant for hypertension, hyperlipidemia, type 2 diabetes mellitus, hypothyroidism, PAF on Eliquis, CAD, and chronic HFpEF presented with lightheadedness and lab abnormalities (renal failure and hyperkalemia on outpatient blood work).  On presentation, CT of the head and cervical spine showed no acute findings.  CT of the chest revealed incidental thyroid and lung nodules.  Creatinine was 3.45, magnesium of 0.6 and potassium of 6.2.  He was treated with Lokelma, IV magnesium, IV calcium, insulin with dextrose and IV fluids.  Cardiology and palliative care were consulted.  During the hospitalization, his condition has improved.  Kidney function has much improved and currently at baseline.  CODE STATUS has been changed to DNR by palliative care team.  Cardiology team has already signed off.  Patient feels okay be discharged today.  Discharge patient back to independent living facility today.  Discharge Diagnoses:   AKI on CKD stage IIIa Hyperkalemia -Baseline creatinine of 1.2-1.6.  Presented with creatinine of 3.45 and potassium of 6.2. -Treated with IV fluids and subsequently discontinued.  Creatinine 1.47 today. -Potassium 5.5 on 04/17/2023 and received a dose of Lokelma.  Potassium 4.1 this morning. -Renal ultrasound negative for  hydronephrosis. -Patient feels okay be discharged today.  Discharge patient back to independent living facility today.  Outpatient follow-up of BMP.   Hyponatremia -Mild.  Monitor closely as an outpatient.  Hypomagnesemia -Replace prior to discharge.  Chronic diastolic heart failure Hypertension Hyperlipidemia -Losartan and Lasix on hold.  Continue diet and fluid restriction.  Continue metoprolol and statin.  Cardiology evaluation appreciated: Has already signed off and recommended outpatient follow-up with cardiology..   Paroxysmal A-fib -Has intermittent tachycardia.  Continue metoprolol.  Continue Eliquis.  Outpatient follow-up with cardiology   CAD -Stable.  No anginal symptoms.  Continue aspirin, metoprolol   Hyperthyroidism -Continue propylthiouracil.   Diabetes mellitus type 2 -Metformin on hold.  Continue CBGs with SSI   COPD -Stable.  Continue albuterol as needed   Thyroid and lung nodules -Noted incidentally on CT in the ED.  Outpatient follow-up   Physical deconditioning -Tolerated PT eval.  Goals of care -Palliative care evaluation appreciated.  CODE STATUS has been changed to DNR by palliative care team  Discharge Instructions  Discharge Instructions     Ambulatory referral to Cardiology   Complete by: As directed    Diet - low sodium heart healthy   Complete by: As directed    Increase activity slowly   Complete by: As directed    No wound care   Complete by: As directed       Allergies as of 04/19/2023   No Known Allergies      Medication List     STOP taking these medications    metFORMIN 500 MG 24 hr tablet Commonly known as: GLUCOPHAGE-XR       TAKE these medications    albuterol 108 (90 Base) MCG/ACT inhaler Commonly known as: VENTOLIN HFA Inhale  2 puffs into the lungs every 6 (six) hours as needed for wheezing or shortness of breath.   apixaban 2.5 MG Tabs tablet Commonly known as: Eliquis Take 1 tablet (2.5 mg total) by  mouth 2 (two) times daily.   atorvastatin 40 MG tablet Commonly known as: LIPITOR Take 1 tablet (40 mg total) by mouth daily.   KRILL OIL PO Take 1 capsule by mouth daily.   latanoprost 0.005 % ophthalmic solution Commonly known as: XALATAN Place 1 drop into both eyes at bedtime.   metoprolol tartrate 25 MG tablet Commonly known as: LOPRESSOR TAKE 1 TABLET BY MOUTH TWICE DAILY   omeprazole 20 MG capsule Commonly known as: PRILOSEC Take 20 mg by mouth daily as needed (heartburn).   propylthiouracil 50 MG tablet Commonly known as: PTU Take 50 mg by mouth daily.   triamcinolone 55 MCG/ACT Aero nasal inhaler Commonly known as: NASACORT Place 2 sprays into the nose daily as needed (ALLERGIES).   trolamine salicylate 10 % cream Commonly known as: ASPERCREME Apply 1 application topically as needed for muscle pain.         Follow-up Information     Azalee Course, [place] Follow up on 05/14/2023.   Specialties: Cardiology, Radiology Why: 1:55PM. Cardiology follow up Contact information: [address] [place] [address] [phone]         Tally Joe, MD. Schedule an appointment as soon as possible for a visit in 1 week(s).   Specialty: Family Medicine Why: With repeat BMP Contact information: [address], [address] [place] [address] [phone]                No Known Allergies  Consultations: Cardiology/palliative care   Procedures/Studies: US RENAL  Result Date: 04/15/2023 CLINICAL DATA:  Acute on chronic renal failure EXAM: RENAL / URINARY TRACT ULTRASOUND COMPLETE COMPARISON:  No MRI abdomen dated 05/27/2018 FINDINGS: Right Kidney: Renal measurements: 8.0 x 3.6 x 4.1 cm = volume: 62.4 mL. Echogenicity within normal limits. 2.0 x 1.2 x 1.8 cm interpolar simple cyst. No hydronephrosis. Left Kidney: Renal measurements: 9.7 x 5.8 x 4.1 cm = volume: 119.5 mL. Echogenicity within normal limits. 6 mm interpolar calculus. No  hydronephrosis. Bladder: Appears normal for degree of bladder distention. Other: None. IMPRESSION: 6 mm left renal calculus.  No hydronephrosis. 2.0 cm interpolar right renal simple cysts, benign. No follow-up recommended. Electronically Signed   By: Charline Bills M.D.   On: 04/15/2023 22:54   CT Chest Wo Contrast  Result Date: 04/15/2023 CLINICAL DATA:  Chest trauma EXAM: CT CHEST WITHOUT CONTRAST TECHNIQUE: Multidetector CT imaging of the chest was performed following the standard protocol without IV contrast. RADIATION DOSE REDUCTION: This exam was performed according to the departmental dose-optimization program which includes automated exposure control, adjustment of the mA and/or kV according to patient size and/or use of iterative reconstruction technique. COMPARISON:  CT of the chest 06/18/2018 FINDINGS: Cardiovascular: No significant vascular findings. Normal heart size. No pericardial effusion. There are atherosclerotic calcifications of the aorta and coronary arteries. Mediastinum/Nodes: Thyroid gland is heterogeneous. Thyroid nodules are present measuring up to 18 mm on the left. There are no enlarged mediastinal or hilar lymph nodes. The esophagus is within normal limits. Lungs/Pleura: There is stable mild smooth pleural thickening on the right with a small right pleural effusion, partially loculated. Slightly ovoid airspace opacities are seen in the right lower lobe and right middle lobe anteriorly which may represent rounded atelectasis, although other etiologies are not  excluded. Findings are stable from prior. There is some stable scarring in the lateral right mid lung and right upper lobe. There is a single new peripheral nodule in the right upper lobe measuring 4 mm image 4/54. There is no pneumothorax. There some calcified granulomas in the left upper lobe. Upper Abdomen: Gallstones are present. Severe atherosclerotic calcifications are present. No acute findings. Musculoskeletal: The  bones are osteopenic. There is trace chronic compression deformity of T7, unchanged. No acute fractures are seen. IMPRESSION: 1. No acute posttraumatic sequelae in the chest. 2. Stable small partially loculated right pleural effusion. 3. Stable airspace disease in the right lower lobe and right middle lobe favored as rounded atelectasis. 4. Incidental left thyroid nodule measuring 1.8 cm. Recommend non-emergent thyroid ultrasound if clinically warranted given patient age. Reference: J Am Coll Radiol. 2015 Feb;12(2): 143-50 5. New solid pulmonary nodule within the upper lobe measuring 4 mm. Per Fleischner Society Guidelines, if patient is low risk for malignancy, no routine follow-up imaging is recommended. If patient is high risk for malignancy, a non-contrast Chest CT at 12 months is optional. If performed and the nodule is stable at 12 months, no further follow-up is recommended. These guidelines do not apply to immunocompromised patients and patients with cancer. Follow up in patients with significant comorbidities as clinically warranted. For lung cancer screening, adhere to Lung-RADS guidelines. Reference: Radiology. 2017; 284(1):228-43. 6. Cholelithiasis. Aortic Atherosclerosis (ICD10-I70.0). Electronically Signed   By: Darliss Cheney M.D.   On: 04/15/2023 21:02   CT Head Wo Contrast  Result Date: 04/15/2023 CLINICAL DATA:  Recent fall with headaches and neck pain, initial encounter EXAM: CT HEAD WITHOUT CONTRAST CT CERVICAL SPINE WITHOUT CONTRAST TECHNIQUE: Multidetector CT imaging of the head and cervical spine was performed following the standard protocol without intravenous contrast. Multiplanar CT image reconstructions of the cervical spine were also generated. RADIATION DOSE REDUCTION: This exam was performed according to the departmental dose-optimization program which includes automated exposure control, adjustment of the mA and/or kV according to patient size and/or use of iterative reconstruction  technique. COMPARISON:  None Available. FINDINGS: CT HEAD FINDINGS Brain: No evidence of acute infarction, hemorrhage, hydrocephalus, extra-axial collection or mass lesion/mass effect. Chronic atrophic and ischemic changes are noted. Small lacunar infarct is noted within the left thalamus. 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 Vascular: No hyperdense vessel or unexpected calcification. Skull: Normal. Negative for fracture or focal lesion. Sinuses/Orbits: No acute finding. Other: None. CT CERVICAL SPINE FINDINGS Alignment: Within normal limits. Skull base and vertebrae: 7 cervical segments are well visualized. Vertebral body height is well maintained. Multilevel disc space narrowing and facet hypertrophic changes are noted. No acute fracture or acute facet abnormality is noted. The odontoid is within normal limits. Soft tissues and spinal canal: Surrounding soft tissue structures are within normal limits. Upper chest: Visualized lung apices are unremarkable. Other: None IMPRESSION: CT of the head: Chronic atrophic and ischemic changes as described. CT of the cervical spine: Multilevel degenerative change without acute abnormality. Electronically Signed   By: Alcide Clever M.D.   On: 04/15/2023 20:49   CT Cervical Spine Wo Contrast  Result Date: 04/15/2023 CLINICAL DATA:  Recent fall with headaches and neck pain, initial encounter EXAM: CT HEAD WITHOUT CONTRAST CT CERVICAL SPINE WITHOUT CONTRAST TECHNIQUE: Multidetector CT imaging of the head and cervical spine was performed following the standard protocol without intravenous contrast. Multiplanar CT image reconstructions of the cervical spine were also generated. RADIATION DOSE REDUCTION: This exam was performed according to the  departmental dose-optimization program which includes automated exposure control, adjustment of the mA and/or kV according to patient size and/or use of iterative reconstruction technique. COMPARISON:  None Available. FINDINGS: CT HEAD  FINDINGS Brain: No evidence of acute infarction, hemorrhage, hydrocephalus, extra-axial collection or mass lesion/mass effect. Chronic atrophic and ischemic changes are noted. Small lacunar infarct is noted within the left thalamus. 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 Vascular: No hyperdense vessel or unexpected calcification. Skull: Normal. Negative for fracture or focal lesion. Sinuses/Orbits: No acute finding. Other: None. CT CERVICAL SPINE FINDINGS Alignment: Within normal limits. Skull base and vertebrae: 7 cervical segments are well visualized. Vertebral body height is well maintained. Multilevel disc space narrowing and facet hypertrophic changes are noted. No acute fracture or acute facet abnormality is noted. The odontoid is within normal limits. Soft tissues and spinal canal: Surrounding soft tissue structures are within normal limits. Upper chest: Visualized lung apices are unremarkable. Other: None IMPRESSION: CT of the head: Chronic atrophic and ischemic changes as described. CT of the cervical spine: Multilevel degenerative change without acute abnormality. Electronically Signed   By: Alcide Clever M.D.   On: 04/15/2023 20:49   DG Chest 1 View  Result Date: 04/15/2023 CLINICAL DATA:  Fall, abnormal potassium EXAM: CHEST  1 VIEW COMPARISON:  11/10/2019 FINDINGS: Volume loss in the right hemithorax with right lower lobe scarring/atelectasis and a small right pleural effusion, chronic. Left lung is clear. The heart is top-normal in size. IMPRESSION: Right lower lobe scarring/atelectasis with a small right pleural effusion, chronic. Electronically Signed   By: Charline Bills M.D.   On: 04/15/2023 19:54      Subjective: Patient seen and examined at bedside.  Hard of hearing.  Feels much better and feels okay to be discharged today.  No fever, vomiting, chest pain reported. Discharge Exam: Vitals:   04/19/23 0550 04/19/23 0744  BP: (!) 147/82 118/62  Pulse: 100 (!) 122  Resp: 16   Temp:  98.6 F (37 C) 98.2 F (36.8 C)  SpO2: 99% 97%    General: Pt is alert, awake, not in acute distress.  Elderly male lying in bed.  Hard of hearing.  On room air. Cardiovascular: Mild intermittent tachycardia present S1/S2 + Respiratory: bilateral decreased breath sounds at bases with basilar crackles Abdominal: Soft, NT, ND, bowel sounds + Extremities: Trace lower extremity edema present; no cyanosis    The results of significant diagnostics from this hospitalization (including imaging, microbiology, ancillary and laboratory) are listed below for reference.     Microbiology: No results found for this or any previous visit (from the past 240 hour(s)).   Labs: BNP (last 3 results) No results for input(s): "BNP" in the last 8760 hours. Basic Metabolic Panel: Recent Labs  Lab 04/15/23 1710 04/16/23 0120 04/16/23 0910 04/17/23 0548 04/18/23 1235 04/19/23 0537  NA 129*  --  130* 131* 133* 129*  K 6.2*  --  4.3 5.5* 4.4 4.1  CL 91*  --  98 100 101 99  CO2 20*  --  19* 22 21* 21*  GLUCOSE 116* 86 134* 173* 125* 132*  BUN 65*  --  54* 41* 29* 32*  CREATININE 3.45*  --  2.52* 2.13* 1.35* 1.47*  CALCIUM 7.3*  --  7.1* 7.6* 8.4* 8.4*  MG 0.6*  --  1.2* 2.3 1.7 1.5*  PHOS  --   --  4.4  --   --   --    Liver Function Tests: Recent Labs  Lab 04/15/23  1710  AST 27  ALT 19  ALKPHOS 92  BILITOT 1.3*  PROT 8.9*  ALBUMIN 3.6   No results for input(s): "LIPASE", "AMYLASE" in the last 168 hours. No results for input(s): "AMMONIA" in the last 168 hours. CBC: Recent Labs  Lab 04/15/23 1710 04/16/23 0910 04/17/23 0548 04/18/23 1235 04/19/23 0537  WBC 7.0 5.8 6.8 9.0 8.2  NEUTROABS 6.2  --   --   --  6.1  HGB 13.6 12.2* 12.1* 12.0* 11.9*  HCT 41.4 36.8* 37.7* 37.2* 36.3*  MCV 88.7 86.0 86.9 89.2 88.5  PLT 263 221 201 230 207   Cardiac Enzymes: No results for input(s): "CKTOTAL", "CKMB", "CKMBINDEX", "TROPONINI" in the last 168 hours. BNP: Invalid input(s):  "POCBNP" CBG: Recent Labs  Lab 04/18/23 0712 04/18/23 1121 04/18/23 1613 04/18/23 1957 04/19/23 0723  GLUCAP 131* 122* 133* 186* 124*   D-Dimer No results for input(s): "DDIMER" in the last 72 hours. Hgb A1c No results for input(s): "HGBA1C" in the last 72 hours. Lipid Profile No results for input(s): "CHOL", "HDL", "LDLCALC", "TRIG", "CHOLHDL", "LDLDIRECT" in the last 72 hours. Thyroid function studies No results for input(s): "TSH", "T4TOTAL", "T3FREE", "THYROIDAB" in the last 72 hours.  Invalid input(s): "FREET3" Anemia work up No results for input(s): "VITAMINB12", "FOLATE", "FERRITIN", "TIBC", "IRON", "RETICCTPCT" in the last 72 hours. Urinalysis    Component Value Date/Time   COLORURINE YELLOW 04/15/2023 2300   APPEARANCEUR CLEAR 04/15/2023 2300   LABSPEC 1.010 04/15/2023 2300   PHURINE 5.0 04/15/2023 2300   GLUCOSEU NEGATIVE 04/15/2023 2300   HGBUR NEGATIVE 04/15/2023 2300   BILIRUBINUR NEGATIVE 04/15/2023 2300   KETONESUR 5 (A) 04/15/2023 2300   PROTEINUR NEGATIVE 04/15/2023 2300   NITRITE NEGATIVE 04/15/2023 2300   LEUKOCYTESUR NEGATIVE 04/15/2023 2300   Sepsis Labs Recent Labs  Lab 04/16/23 0910 04/17/23 0548 04/18/23 1235 04/19/23 0537  WBC 5.8 6.8 9.0 8.2   Microbiology No results found for this or any previous visit (from the past 240 hour(s)).   Time coordinating discharge: 35 minutes  SIGNED:   Glade Lloyd, MD  [facility] 04/19/2023, 9:11 AM

## 2023-04-19 NOTE — Progress Notes (Addendum)
Physical Therapy Treatment Patient Details Name: Shaun Mclaughlin MRN: [id number] DOB: April 14, 1935 Today's Date: 04/19/2023   History of Present Illness 88 y.o. male presents to [facility] hospital on 04/15/2023 with lightheadedness, hyperkalemia. PMH includes HTN, HLD, DMII, hypothyroidism, PAF, CAD, chronic HFpEF.    PT Comments  Pt received in chair, agreeable to therapy session and with good participation and tolerance for transfer and gait training with rollator. Pt new rollator DME had not arrived yet so PTA instructed pt on proper height if needing to adjust handle height and also instructed on use of brakes before/after transfers. Pt needing up to CGA for safety to perform transfers/gait but mostly Supervision with verbal cues. Pt continues to benefit from PT services to progress toward functional mobility goals, he denies dizziness this date with transfers. Pt agreeable to HHPT services upon DC "for a short time" with new/unfamiliar assistive device instruction and for home safety assessment to reduce fall risk, discussed with supervising PT Shaun Mclaughlin and pt.   If plan is discharge home, recommend the following: A little help with bathing/dressing/bathroom;Assistance with cooking/housework;Help with stairs or ramp for entrance   Can travel by private vehicle        Equipment Recommendations  Rollator (4 wheels)    Recommendations for Other Services       Precautions / Restrictions Precautions Precautions: Fall Precaution Comments: 2 recent falls Restrictions Weight Bearing Restrictions: No     Mobility  Bed Mobility               General bed mobility comments: pt received up in chair, sitting in recliner at end of session    Transfers Overall transfer level: Needs assistance Equipment used: Rolling walker (2 wheels) Transfers: Sit to/from Stand Sit to Stand: Contact guard assist, Supervision           General transfer comment: From chair>rollator wtih CGA due to  increased time needed to transfer from sit>stand and Supervision for stand>sit for cues for brakes/safe UE placement.    Ambulation/Gait Ambulation/Gait assistance: Supervision Gait Distance (Feet): 200 Feet Assistive device: Rollator (4 wheels) Gait Pattern/deviations: Step-through pattern Gait velocity: Grossly <0.4 m/s but functional     General Gait Details: Good rollator mgmt, discussed adjustment/proper height of rollator handles as new device had not yet been delivered to his room. Good step length using rollator, fair cadence.   Stairs             Wheelchair Mobility     Tilt Bed    Modified Rankin (Stroke Patients Only)       Balance Overall balance assessment: Needs assistance Sitting-balance support: No upper extremity supported, Feet supported Sitting balance-Leahy Scale: Good     Standing balance support: No upper extremity supported, Bilateral upper extremity supported, Single extremity supported Standing balance-Leahy Scale: Fair Standing balance comment: Needs BUE support of rollator due to R knee pain but static standing good with no AD                            Cognition Arousal: Alert Behavior During Therapy: WFL for tasks assessed/performed Overall Cognitive Status: Within Functional Limits for tasks assessed                                          Exercises      General Comments General comments (skin  integrity, edema, etc.): Attempted SpO2/HR reading however all fingertips cold and could not get good sensor reading. DOE 1/4 improves with seated break.      Pertinent Vitals/Pain Pain Assessment Pain Assessment: 0-10 Pain Score: 3  Pain Location: R knee Pain Descriptors / Indicators: Aching Pain Intervention(s): Monitored during session, Repositioned    Home Living                          Prior Function            PT Goals (current goals can now be found in the care plan section)  Acute Rehab PT Goals Patient Stated Goal: to return home PT Goal Formulation: With patient Time For Goal Achievement: 05/01/23 Progress towards PT goals: Progressing toward goals    Frequency    Min 1X/week      PT Plan      Co-evaluation              AM-PAC PT "6 Clicks" Mobility   Outcome Measure  Help needed turning from your back to your side while in a flat bed without using bedrails?: A Little Help needed moving from lying on your back to sitting on the side of a flat bed without using bedrails?: A Little Help needed moving to and from a bed to a chair (including a wheelchair)?: A Little Help needed standing up from a chair using your arms (e.g., wheelchair or bedside chair)?: A Little Help needed to walk in hospital room?: A Little Help needed climbing 3-5 steps with a railing? : A Lot 6 Click Score: 17    End of Session Equipment Utilized During Treatment: Gait belt Activity Tolerance: Patient tolerated treatment well Patient left: in chair;with call bell/phone within reach;with chair alarm set Nurse Communication: Mobility status;Other (comment) (may need rollator adjusted, RN can request PTA assist when it arrives) PT Visit Diagnosis: Other abnormalities of gait and mobility (R26.89);Muscle weakness (generalized) (M62.81)     Time: 3818-2993 PT Time Calculation (min) (ACUTE ONLY): 14 min  Charges:    $Gait Training: 8-22 mins PT General Charges $$ ACUTE PT VISIT: 1 Visit                     Shaun Mclaughlin P., PTA Acute Rehabilitation Services Secure Chat Preferred 9a-5:30pm Office: [phone]    Shaun Mclaughlin [facility] 04/19/2023, 2:00 PM

## 2023-04-19 NOTE — Progress Notes (Signed)
Occupational Therapy Treatment Patient Details Name: Shaun Mclaughlin MRN: [id number] DOB: Nov 30, 1934 Today's Date: 04/19/2023   History of present illness 88 y.o. male presents to [facility] hospital on 04/15/2023 with lightheadedness, hyperkalemia. PMH includes HTN, HLD, DMII, hypothyroidism, PAF, CAD, chronic HFpEF.   OT comments  Pt agreed to session and reported R knee feeling more stiff today due to decrease in mobility at this time. Pt agreed needing a walker per Physical Therapy recommendations due to unsteadiness at times. Shaun Mclaughlin was educated about positioning in shower in sitting position but reported there is not enough space for a shower chair in current set up. Pt also educated on use of AE on how to assist with LB dressing as noted decrease in ability to complete dressing with RLE and requiring min assist. Pt agreed with [facility] Occupational Therapy at dc.       If plan is discharge home, recommend the following:  A little help with walking and/or transfers;A little help with bathing/dressing/bathroom;Assistance with cooking/housework   Equipment Recommendations  None recommended by OT (Pt reporting they can not have a shower seat in space as to small but maybe can set up with [facility] OT looking into set up to decrease risk of falls at home.)    Recommendations for Other Services      Precautions / Restrictions Precautions Precautions: Fall Precaution Comments: 2 recent falls Restrictions Weight Bearing Restrictions: No       Mobility Bed Mobility Overal bed mobility: Modified Independent Bed Mobility: Supine to Sit     Supine to sit: Modified independent (Device/Increase time)     General bed mobility comments: HOB elevated but reported bed at home can move    Transfers Overall transfer level: Needs assistance Equipment used: Rolling walker (2 wheels) Transfers: Sit to/from Stand Sit to Stand: Supervision, Contact guard assist           General transfer comment: Pt on  first transfer out of bed needed CGA but then was supervision as per pt reported R knee feeling stiff prior to attempting to transfer.     Balance Overall balance assessment: Needs assistance Sitting-balance support: No upper extremity supported, Feet supported Sitting balance-Leahy Scale: Good     Standing balance support: No upper extremity supported, Bilateral upper extremity supported, Single extremity supported Standing balance-Leahy Scale: Fair Standing balance comment: Needs BUE when first ambulating due to R knee feeling stiff but then needed decrease in support as mobilizing.                           ADL either performed or assessed with clinical judgement   ADL Overall ADL's : Needs assistance/impaired Eating/Feeding: Independent;Sitting   Grooming: Oral care;Supervision/safety;Standing   Upper Body Bathing: Set up;Sitting   Lower Body Bathing: Minimal assistance;Sit to/from stand   Upper Body Dressing : Set up;Sitting   Lower Body Dressing: Minimal assistance;Sit to/from stand   Toilet Transfer: Supervision/safety;Rolling walker (2 wheels)   Toileting- Clothing Manipulation and Hygiene: Supervision/safety;Sit to/from stand   Tub/ Shower Transfer: Contact guard assist;Cueing for safety;Cueing for sequencing;Walk-in shower;Rolling walker (2 wheels)   Functional mobility during ADLs: Contact guard assist;Supervision/safety;Rolling walker (2 wheels)      Extremity/Trunk Assessment Upper Extremity Assessment Upper Extremity Assessment: Overall WFL for tasks assessed (bandages limited AROM)   Lower Extremity Assessment Lower Extremity Assessment: Defer to PT evaluation        Vision       Perception  Praxis      Cognition Arousal: Alert Behavior During Therapy: WFL for tasks assessed/performed Overall Cognitive Status: Within Functional Limits for tasks assessed                                          Exercises       Shoulder Instructions       General Comments      Pertinent Vitals/ Pain       Pain Assessment Pain Assessment: Faces Faces Pain Scale: Hurts a little bit Pain Location: R knee Pain Descriptors / Indicators: Aching Pain Intervention(s): Limited activity within patient's tolerance, Monitored during session  Home Living                                          Prior Functioning/Environment              Frequency  Min 1X/week        Progress Toward Goals  OT Goals(current goals can now be found in the care plan section)  Progress towards OT goals: Progressing toward goals  Acute Rehab OT Goals Patient Stated Goal: to go home OT Goal Formulation: With patient Time For Goal Achievement: 04/30/23 Potential to Achieve Goals: Good  Plan      Co-evaluation                 AM-PAC OT "6 Clicks" Daily Activity     Outcome Measure   Help from another person eating meals?: None Help from another person taking care of personal grooming?: None Help from another person toileting, which includes using toliet, bedpan, or urinal?: A Little Help from another person bathing (including washing, rinsing, drying)?: A Little Help from another person to put on and taking off regular upper body clothing?: None Help from another person to put on and taking off regular lower body clothing?: A Little 6 Click Score: 21    End of Session Equipment Utilized During Treatment: Gait belt;Rolling walker (2 wheels)  OT Visit Diagnosis: Unsteadiness on feet (R26.81);Other abnormalities of gait and mobility (R26.89)   Activity Tolerance Patient tolerated treatment well   Patient Left in chair;with call bell/phone within reach;with chair alarm set   Nurse Communication Mobility status (requesting cream for R knee)        Time: 1610-9604 OT Time Calculation (min): 30 min  Charges: OT General Charges $OT Visit: 1 Visit OT Treatments $Self Care/Home  Management : 23-37 mins  Presley Raddle OTR/L  Acute Rehab Services  [phone] office number   Alphia Moh 04/19/2023, 9:36 AM

## 2023-04-19 NOTE — Progress Notes (Signed)
Discharge orders received. AVS printed and given to patient. IV and telemetry removed. Patient given rolling walker per PT recommendations. Patient transported to ALF via [facility] transportation team.

## 2023-04-19 NOTE — Progress Notes (Signed)
DISCHARGE NOTE HOME SCHNEIDER GAVINS to be discharged Home per MD order. Discussed prescriptions and follow up appointments with the patient. Prescriptions given to patient; medication list explained in detail. Patient verbalized understanding.  Skin clean, dry and intact without evidence of skin break down, no evidence of skin tears noted. IV catheter discontinued intact. Site without signs and symptoms of complications. Dressing and pressure applied. Pt denies pain at the site currently. No complaints noted.  Patient free of lines, drains, and wounds.   An After Visit Summary (AVS) was printed and given to the patient. Patient escorted via wheelchair, and discharged home [facility] from [facility]).  Margarita Grizzle, RN

## 2023-04-19 NOTE — TOC Transition Note (Addendum)
Transition of Care [facility]) - CM/SW Discharge Note   Patient Details  Name: Shaun Mclaughlin MRN: [id number] Date of Birth: 12-May-1935  [facility] CM/SW Contact:  Shaun Mclaughlin, Shaun Coria, RN Phone Number: 04/19/2023, 11:23 AM   Clinical Narrative:     Patient is scheduled for discharge today.  Readmission Risk Assessment done. Home Health PT/OT recommended, patient and wife, Shaun Mclaughlin requested patient to use [facility] at [facility] [employer].  CM called and spoke with Shaun Mclaughlin [phone]), order and Facesheet faxed to [phone]) with successful receipt noted. Outpatient f/u, hospital f/u and discharge instructions on AVS. RW ordered from [employer], Shaun Mclaughlin to deliver to patient at discharge. Wife to arrange transportation with [facility] at discharge.  No further TOC needs noted.         Barriers to Discharge: Barriers Resolved   Patient Goals and CMS Choice CMS Medicare.gov Compare Post Acute Care list provided to:: Patient Choice offered to / list presented to : Patient, Spouse  Discharge Placement                  Patient to be transferred to facility by: [employer] Theatre manager Additional resources added to the After Visit Summary for                  DME Arranged: Walker rolling with seat DME Agency: [employer]                  Social Determinants of Health (SDOH) Interventions SDOH Screenings   Food Insecurity: No Food Insecurity (04/16/2023)  Housing: Low Risk  (04/16/2023)  Transportation Needs: No Transportation Needs (04/16/2023)  Utilities: Not At Risk (04/16/2023)  Tobacco Use: High Risk (04/16/2023)     Readmission Risk Interventions    04/19/2023   11:20 AM  Readmission Risk Prevention Plan  Transportation Screening Complete  PCP or Specialist Appt within 5-7 Days Complete  Home Care Screening Complete  Medication Review (RN CM) Referral to Pharmacy

## 2023-04-28 DIAGNOSIS — R2681 Unsteadiness on feet: Secondary | ICD-10-CM | POA: Diagnosis not present

## 2023-04-28 DIAGNOSIS — R278 Other lack of coordination: Secondary | ICD-10-CM | POA: Diagnosis not present

## 2023-04-28 DIAGNOSIS — R296 Repeated falls: Secondary | ICD-10-CM | POA: Diagnosis not present

## 2023-04-28 DIAGNOSIS — M6281 Muscle weakness (generalized): Secondary | ICD-10-CM | POA: Diagnosis not present

## 2023-04-28 DIAGNOSIS — W19XXXA Unspecified fall, initial encounter: Secondary | ICD-10-CM | POA: Diagnosis not present

## 2023-04-29 DIAGNOSIS — N1832 Chronic kidney disease, stage 3b: Secondary | ICD-10-CM | POA: Diagnosis not present

## 2023-04-29 DIAGNOSIS — D649 Anemia, unspecified: Secondary | ICD-10-CM | POA: Diagnosis not present

## 2023-04-29 DIAGNOSIS — R911 Solitary pulmonary nodule: Secondary | ICD-10-CM | POA: Diagnosis not present

## 2023-04-29 DIAGNOSIS — E0591 Thyrotoxicosis, unspecified with thyrotoxic crisis or storm: Secondary | ICD-10-CM | POA: Diagnosis not present

## 2023-04-29 DIAGNOSIS — I130 Hypertensive heart and chronic kidney disease with heart failure and stage 1 through stage 4 chronic kidney disease, or unspecified chronic kidney disease: Secondary | ICD-10-CM | POA: Diagnosis not present

## 2023-04-29 DIAGNOSIS — E782 Mixed hyperlipidemia: Secondary | ICD-10-CM | POA: Diagnosis not present

## 2023-04-29 DIAGNOSIS — E875 Hyperkalemia: Secondary | ICD-10-CM | POA: Diagnosis not present

## 2023-04-29 DIAGNOSIS — I5042 Chronic combined systolic (congestive) and diastolic (congestive) heart failure: Secondary | ICD-10-CM | POA: Diagnosis not present

## 2023-04-29 DIAGNOSIS — E1151 Type 2 diabetes mellitus with diabetic peripheral angiopathy without gangrene: Secondary | ICD-10-CM | POA: Diagnosis not present

## 2023-04-29 DIAGNOSIS — E1122 Type 2 diabetes mellitus with diabetic chronic kidney disease: Secondary | ICD-10-CM | POA: Diagnosis not present

## 2023-04-30 ENCOUNTER — Other Ambulatory Visit: Payer: Self-pay | Admitting: Family Medicine

## 2023-04-30 DIAGNOSIS — E041 Nontoxic single thyroid nodule: Secondary | ICD-10-CM

## 2023-05-06 ENCOUNTER — Ambulatory Visit
Admission: RE | Admit: 2023-05-06 | Discharge: 2023-05-06 | Disposition: A | Discharge status: Home / Self Care | Payer: Medicare Other | Source: Ambulatory Visit | Attending: Family Medicine | Admitting: Family Medicine

## 2023-05-06 DIAGNOSIS — E041 Nontoxic single thyroid nodule: Secondary | ICD-10-CM | POA: Diagnosis not present

## 2023-05-11 DIAGNOSIS — R278 Other lack of coordination: Secondary | ICD-10-CM | POA: Diagnosis not present

## 2023-05-11 DIAGNOSIS — R2681 Unsteadiness on feet: Secondary | ICD-10-CM | POA: Diagnosis not present

## 2023-05-11 DIAGNOSIS — M6281 Muscle weakness (generalized): Secondary | ICD-10-CM | POA: Diagnosis not present

## 2023-05-11 DIAGNOSIS — W19XXXA Unspecified fall, initial encounter: Secondary | ICD-10-CM | POA: Diagnosis not present

## 2023-05-11 DIAGNOSIS — R296 Repeated falls: Secondary | ICD-10-CM | POA: Diagnosis not present

## 2023-05-13 NOTE — Progress Notes (Signed)
STRESS TESTS  NM MYOCAR MULTI W/SPECT W 06/02/2012   ECHOCARDIOGRAM  ECHOCARDIOGRAM COMPLETE 10/09/2021  Narrative ECHOCARDIOGRAM REPORT    Patient Name:   Shaun Mclaughlin Date of Exam: 10/09/2021 Medical Rec #:  [id number]       Height:       66.0  in Accession #:    [id number]      Weight:       148.6 lb Date of Birth:  02-02-1935       BSA:          1.763 m Patient Age:    88 years        BP:           132/72 mmHg Patient Gender: M               HR:           75 bpm. Exam Location:  [address]  Procedure: 2D Echo, Cardiac Doppler, Color Doppler and Intracardiac Opacification Agent  Indications:    I50.9 CHF  History:        Patient has prior history of Echocardiogram examinations, most recent 06/08/2014. CHF, CAD, PVD and COPD, Arrythmias:Bradycardia; Risk Factors:Hypertension, Diabetes, Dyslipidemia and Current Smoker.  Sonographer:    Shaun Mclaughlin BS, RDCS Referring Phys: [phone] [facility] Shaun Mclaughlin   Sonographer Comments: Technically difficult study due to poor echo windows and suboptimal apical window. IMPRESSIONS   1. Left ventricular ejection fraction, by estimation, is 50 to 55%. The left ventricle has low normal function. The left ventricle has no regional wall motion abnormalities. There is mild left ventricular hypertrophy. Left ventricular diastolic function could not be evaluated. 2. Right ventricular systolic function was not well visualized. The right ventricular size is normal. There is severely elevated pulmonary artery systolic pressure. 3. Left atrial size was moderately dilated. 4. The mitral valve is grossly normal. Trivial mitral valve regurgitation. No evidence of mitral stenosis. Moderate mitral annular calcification. 5. The aortic valve was not well visualized. There is moderate calcification of the aortic valve. There is mild thickening of the aortic valve. Aortic valve regurgitation is not visualized. Aortic valve sclerosis/calcification is present, without any evidence of aortic stenosis. 6. The inferior vena cava is dilated in size with >50% respiratory variability, suggesting right atrial pressure of 8 mmHg.  Comparison(s): No significant change from prior study. 06/08/14 EF 45-50%. PA pressure  .  FINDINGS Left Ventricle: Left ventricular ejection fraction, by estimation, is 50 to 55%. The left ventricle has low normal function. The left ventricle has no regional wall motion abnormalities. Definity contrast agent was given IV to delineate the left ventricular endocardial borders. The left ventricular internal cavity size was normal in size. There is mild left ventricular hypertrophy. Left ventricular diastolic function could not be evaluated.  Right Ventricle: The right ventricular size is normal. Right vetricular wall thickness was not well visualized. Right ventricular systolic function was not well visualized. There is severely elevated pulmonary artery systolic pressure. The tricuspid regurgitant velocity is 3.85 m/s, and with an assumed right atrial pressure of 8 mmHg, the estimated right ventricular systolic pressure is 67.3 mmHg.  Left Atrium: Left atrial size was moderately dilated.  Right Atrium: Right atrial size was normal in size.  Pericardium: There is no evidence of pericardial effusion.  Mitral Valve: The mitral valve is grossly normal. There is mild thickening of the mitral valve leaflet(s). There is mild calcification of the mitral valve leaflet(s). Moderate mitral annular calcification. Trivial mitral valve regurgitation.  Cardiology Office Note    Date:  05/14/2023  ID:  Shaun Mclaughlin, DOB 1934-12-13, MRN [id number] PCP:  Shaun Joe, MD  Cardiologist:  Shaun Fair, MD  Electrophysiologist:  None   Chief Complaint: Hospital follow up   History of Present Illness: .    Shaun Mclaughlin is a 88 y.o. male with visit-pertinent history of permanent atrial fibrillation, peripheral arterial disease, coronary artery disease (stent RCA 2003), HFrEF with improved EF, CKD stage III, PAD (last revascularization left subclavian stent 2014), left renal artery stenosis, COPD, controlled type 2 diabetes mellitus and hypertension.  He also has a history of bradycardia and bifascicular block, Dr. Royann Mclaughlin suspected patient may require pacemaker at some point the future.  In 2015 LVEF was 35%, however improved to 5055% by March 2023.  Echocardiogram in March 2023 also revealed severely elevated pulmonary artery systolic pressure.  He is on Eliquis 2.5 mg twice a day based on age and renal function.  He is followed by [facility] family medicine.  On 04/15/2023 he presented to the emergency room after having lab work completed at his PCPs office which showed AKI and hyperkalemia, the day prior he had a fall, reportedly he tried to turn and his foot was caught he then fell backward into refrigerator and injured his elbow.  On arrival to [facility] his sodium was 129, potassium 6.2, creatinine elevated at 3.45, magnesium 0.6.  EKG showed atrial fibrillation, right bundle branch block, heart rate borderline elevated at 100.  White blood count was normal.  CT of the cervical spine showed no acute fracture.  CT of the head showed no acute intracranial abnormality however does show chronic atrophic and ischemic change.  CT of the chest showed small partially loculated right pleural effusion, incidental finding of 1.8 cm left thyroid nodule.  For significant hyperkalemia he was treated with Lokelma, insulin with dextrose and IV calcium.   He was also treated with IV magnesium.  Renal ultrasound showed no sign of hydronephrosis.  His home Lasix 80 mg daily and losartan 50 mg daily had been held.  Creatinine improved with hydration from 3.45-2.52, sodium improved to 130 and potassium 4.3.  Today Mr. Shaun Mclaughlin presents for hospital follow-up.  He reports that he is doing very well overall, he has been working with physical therapy at his living facility.  He denies experiencing any further dizziness or lightheadedness.  He denies chest pain, shortness of breath, lower extremity edema, palpitations, orthopnea or PND.  He denies any bleeding problems with his Eliquis.  Labwork independently reviewed: 04/29/23: creatinine 1.22, k 5.3, sodium 136, hgb 11.0, hct 33.4  ROS: .   Today he denies chest pain, shortness of breath, lower extremity edema, fatigue, palpitations, melena, hematuria, hemoptysis, diaphoresis, weakness, presyncope, syncope, orthopnea, and PND.  All other systems are reviewed and otherwise negative. Objective   Studies Reviewed: Marland Kitchen    EKG:  EKG is ordered today, personally reviewed, demonstrating  EKG Interpretation Date/Time:  Friday May 14 2023 13:52:40 EDT Ventricular Rate:  83 PR Interval:    QRS Duration:  130 QT Interval:  408 QTC Calculation: 479 R Axis:   -88  Text Interpretation: Atrial fibrillation Right bundle branch block Left anterior fascicular block Bifascicular block Possible Lateral infarct (cited on or before 12-Mar-2023) Cannot rule out Inferior infarct (cited on or before 12-Mar-2023) No significant change since last tracing Confirmed by Reather Littler [id number]) on 05/14/2023 2:03:12 PM    CV Studies:  Cardiac Studies & Procedures  STRESS TESTS  NM MYOCAR MULTI W/SPECT W 06/02/2012   ECHOCARDIOGRAM  ECHOCARDIOGRAM COMPLETE 10/09/2021  Narrative ECHOCARDIOGRAM REPORT    Patient Name:   Shaun Mclaughlin Date of Exam: 10/09/2021 Medical Rec #:  [id number]       Height:       66.0  in Accession #:    [id number]      Weight:       148.6 lb Date of Birth:  02-02-1935       BSA:          1.763 m Patient Age:    88 years        BP:           132/72 mmHg Patient Gender: M               HR:           75 bpm. Exam Location:  [address]  Procedure: 2D Echo, Cardiac Doppler, Color Doppler and Intracardiac Opacification Agent  Indications:    I50.9 CHF  History:        Patient has prior history of Echocardiogram examinations, most recent 06/08/2014. CHF, CAD, PVD and COPD, Arrythmias:Bradycardia; Risk Factors:Hypertension, Diabetes, Dyslipidemia and Current Smoker.  Sonographer:    Shaun Mclaughlin BS, RDCS Referring Phys: [phone] [facility] Shaun Mclaughlin   Sonographer Comments: Technically difficult study due to poor echo windows and suboptimal apical window. IMPRESSIONS   1. Left ventricular ejection fraction, by estimation, is 50 to 55%. The left ventricle has low normal function. The left ventricle has no regional wall motion abnormalities. There is mild left ventricular hypertrophy. Left ventricular diastolic function could not be evaluated. 2. Right ventricular systolic function was not well visualized. The right ventricular size is normal. There is severely elevated pulmonary artery systolic pressure. 3. Left atrial size was moderately dilated. 4. The mitral valve is grossly normal. Trivial mitral valve regurgitation. No evidence of mitral stenosis. Moderate mitral annular calcification. 5. The aortic valve was not well visualized. There is moderate calcification of the aortic valve. There is mild thickening of the aortic valve. Aortic valve regurgitation is not visualized. Aortic valve sclerosis/calcification is present, without any evidence of aortic stenosis. 6. The inferior vena cava is dilated in size with >50% respiratory variability, suggesting right atrial pressure of 8 mmHg.  Comparison(s): No significant change from prior study. 06/08/14 EF 45-50%. PA pressure  .  FINDINGS Left Ventricle: Left ventricular ejection fraction, by estimation, is 50 to 55%. The left ventricle has low normal function. The left ventricle has no regional wall motion abnormalities. Definity contrast agent was given IV to delineate the left ventricular endocardial borders. The left ventricular internal cavity size was normal in size. There is mild left ventricular hypertrophy. Left ventricular diastolic function could not be evaluated.  Right Ventricle: The right ventricular size is normal. Right vetricular wall thickness was not well visualized. Right ventricular systolic function was not well visualized. There is severely elevated pulmonary artery systolic pressure. The tricuspid regurgitant velocity is 3.85 m/s, and with an assumed right atrial pressure of 8 mmHg, the estimated right ventricular systolic pressure is 67.3 mmHg.  Left Atrium: Left atrial size was moderately dilated.  Right Atrium: Right atrial size was normal in size.  Pericardium: There is no evidence of pericardial effusion.  Mitral Valve: The mitral valve is grossly normal. There is mild thickening of the mitral valve leaflet(s). There is mild calcification of the mitral valve leaflet(s). Moderate mitral annular calcification. Trivial mitral valve regurgitation.  STRESS TESTS  NM MYOCAR MULTI W/SPECT W 06/02/2012   ECHOCARDIOGRAM  ECHOCARDIOGRAM COMPLETE 10/09/2021  Narrative ECHOCARDIOGRAM REPORT    Patient Name:   Shaun Mclaughlin Date of Exam: 10/09/2021 Medical Rec #:  [id number]       Height:       66.0  in Accession #:    [id number]      Weight:       148.6 lb Date of Birth:  02-02-1935       BSA:          1.763 m Patient Age:    88 years        BP:           132/72 mmHg Patient Gender: M               HR:           75 bpm. Exam Location:  [address]  Procedure: 2D Echo, Cardiac Doppler, Color Doppler and Intracardiac Opacification Agent  Indications:    I50.9 CHF  History:        Patient has prior history of Echocardiogram examinations, most recent 06/08/2014. CHF, CAD, PVD and COPD, Arrythmias:Bradycardia; Risk Factors:Hypertension, Diabetes, Dyslipidemia and Current Smoker.  Sonographer:    Shaun Mclaughlin BS, RDCS Referring Phys: [phone] [facility] Shaun Mclaughlin   Sonographer Comments: Technically difficult study due to poor echo windows and suboptimal apical window. IMPRESSIONS   1. Left ventricular ejection fraction, by estimation, is 50 to 55%. The left ventricle has low normal function. The left ventricle has no regional wall motion abnormalities. There is mild left ventricular hypertrophy. Left ventricular diastolic function could not be evaluated. 2. Right ventricular systolic function was not well visualized. The right ventricular size is normal. There is severely elevated pulmonary artery systolic pressure. 3. Left atrial size was moderately dilated. 4. The mitral valve is grossly normal. Trivial mitral valve regurgitation. No evidence of mitral stenosis. Moderate mitral annular calcification. 5. The aortic valve was not well visualized. There is moderate calcification of the aortic valve. There is mild thickening of the aortic valve. Aortic valve regurgitation is not visualized. Aortic valve sclerosis/calcification is present, without any evidence of aortic stenosis. 6. The inferior vena cava is dilated in size with >50% respiratory variability, suggesting right atrial pressure of 8 mmHg.  Comparison(s): No significant change from prior study. 06/08/14 EF 45-50%. PA pressure  .  FINDINGS Left Ventricle: Left ventricular ejection fraction, by estimation, is 50 to 55%. The left ventricle has low normal function. The left ventricle has no regional wall motion abnormalities. Definity contrast agent was given IV to delineate the left ventricular endocardial borders. The left ventricular internal cavity size was normal in size. There is mild left ventricular hypertrophy. Left ventricular diastolic function could not be evaluated.  Right Ventricle: The right ventricular size is normal. Right vetricular wall thickness was not well visualized. Right ventricular systolic function was not well visualized. There is severely elevated pulmonary artery systolic pressure. The tricuspid regurgitant velocity is 3.85 m/s, and with an assumed right atrial pressure of 8 mmHg, the estimated right ventricular systolic pressure is 67.3 mmHg.  Left Atrium: Left atrial size was moderately dilated.  Right Atrium: Right atrial size was normal in size.  Pericardium: There is no evidence of pericardial effusion.  Mitral Valve: The mitral valve is grossly normal. There is mild thickening of the mitral valve leaflet(s). There is mild calcification of the mitral valve leaflet(s). Moderate mitral annular calcification. Trivial mitral valve regurgitation.  STRESS TESTS  NM MYOCAR MULTI W/SPECT W 06/02/2012   ECHOCARDIOGRAM  ECHOCARDIOGRAM COMPLETE 10/09/2021  Narrative ECHOCARDIOGRAM REPORT    Patient Name:   Shaun Mclaughlin Date of Exam: 10/09/2021 Medical Rec #:  [id number]       Height:       66.0  in Accession #:    [id number]      Weight:       148.6 lb Date of Birth:  02-02-1935       BSA:          1.763 m Patient Age:    88 years        BP:           132/72 mmHg Patient Gender: M               HR:           75 bpm. Exam Location:  [address]  Procedure: 2D Echo, Cardiac Doppler, Color Doppler and Intracardiac Opacification Agent  Indications:    I50.9 CHF  History:        Patient has prior history of Echocardiogram examinations, most recent 06/08/2014. CHF, CAD, PVD and COPD, Arrythmias:Bradycardia; Risk Factors:Hypertension, Diabetes, Dyslipidemia and Current Smoker.  Sonographer:    Shaun Mclaughlin BS, RDCS Referring Phys: [phone] [facility] Shaun Mclaughlin   Sonographer Comments: Technically difficult study due to poor echo windows and suboptimal apical window. IMPRESSIONS   1. Left ventricular ejection fraction, by estimation, is 50 to 55%. The left ventricle has low normal function. The left ventricle has no regional wall motion abnormalities. There is mild left ventricular hypertrophy. Left ventricular diastolic function could not be evaluated. 2. Right ventricular systolic function was not well visualized. The right ventricular size is normal. There is severely elevated pulmonary artery systolic pressure. 3. Left atrial size was moderately dilated. 4. The mitral valve is grossly normal. Trivial mitral valve regurgitation. No evidence of mitral stenosis. Moderate mitral annular calcification. 5. The aortic valve was not well visualized. There is moderate calcification of the aortic valve. There is mild thickening of the aortic valve. Aortic valve regurgitation is not visualized. Aortic valve sclerosis/calcification is present, without any evidence of aortic stenosis. 6. The inferior vena cava is dilated in size with >50% respiratory variability, suggesting right atrial pressure of 8 mmHg.  Comparison(s): No significant change from prior study. 06/08/14 EF 45-50%. PA pressure  .  FINDINGS Left Ventricle: Left ventricular ejection fraction, by estimation, is 50 to 55%. The left ventricle has low normal function. The left ventricle has no regional wall motion abnormalities. Definity contrast agent was given IV to delineate the left ventricular endocardial borders. The left ventricular internal cavity size was normal in size. There is mild left ventricular hypertrophy. Left ventricular diastolic function could not be evaluated.  Right Ventricle: The right ventricular size is normal. Right vetricular wall thickness was not well visualized. Right ventricular systolic function was not well visualized. There is severely elevated pulmonary artery systolic pressure. The tricuspid regurgitant velocity is 3.85 m/s, and with an assumed right atrial pressure of 8 mmHg, the estimated right ventricular systolic pressure is 67.3 mmHg.  Left Atrium: Left atrial size was moderately dilated.  Right Atrium: Right atrial size was normal in size.  Pericardium: There is no evidence of pericardial effusion.  Mitral Valve: The mitral valve is grossly normal. There is mild thickening of the mitral valve leaflet(s). There is mild calcification of the mitral valve leaflet(s). Moderate mitral annular calcification. Trivial mitral valve regurgitation.

## 2023-05-14 ENCOUNTER — Other Ambulatory Visit: Payer: Self-pay | Admitting: Family Medicine

## 2023-05-14 ENCOUNTER — Encounter: Payer: Self-pay | Admitting: Physician Assistant

## 2023-05-14 ENCOUNTER — Ambulatory Visit: Payer: Medicare Other | Attending: Physician Assistant | Admitting: Cardiology

## 2023-05-14 VITALS — BP 142/72 | HR 83 | Ht 65.0 in | Wt 156.4 lb

## 2023-05-14 DIAGNOSIS — E875 Hyperkalemia: Secondary | ICD-10-CM | POA: Diagnosis not present

## 2023-05-14 DIAGNOSIS — I10 Essential (primary) hypertension: Secondary | ICD-10-CM | POA: Diagnosis not present

## 2023-05-14 DIAGNOSIS — I771 Stricture of artery: Secondary | ICD-10-CM | POA: Diagnosis not present

## 2023-05-14 DIAGNOSIS — I5042 Chronic combined systolic (congestive) and diastolic (congestive) heart failure: Secondary | ICD-10-CM

## 2023-05-14 DIAGNOSIS — I484 Atypical atrial flutter: Secondary | ICD-10-CM

## 2023-05-14 DIAGNOSIS — I2510 Atherosclerotic heart disease of native coronary artery without angina pectoris: Secondary | ICD-10-CM

## 2023-05-14 DIAGNOSIS — I739 Peripheral vascular disease, unspecified: Secondary | ICD-10-CM | POA: Diagnosis not present

## 2023-05-14 DIAGNOSIS — E041 Nontoxic single thyroid nodule: Secondary | ICD-10-CM

## 2023-05-14 MED ORDER — AMLODIPINE BESYLATE 2.5 MG PO TABS: 2.5000 mg | ORAL_TABLET | Freq: Every day | ORAL | 3 refills | Status: DC

## 2023-05-14 NOTE — Patient Instructions (Signed)
Medication Instructions:  START AMLODIPINE 2.5 MG DAILY  *If you need a refill on your cardiac medications before your next appointment, please call your pharmacy*   Lab Work: BMP AND MAGNESIUM TODAY If you have labs (blood work) drawn today and your tests are completely normal, you will receive your results only by: MyChart Message (if you have MyChart) OR A paper copy in the mail If you have any lab test that is abnormal or we need to change your treatment, we will call you to review the results.   Testing/Procedures: NO TESTING   Follow-Up: At [facility], you and your health needs are our priority.  As part of our continuing mission to provide you with exceptional heart care, we have created designated Provider Care Teams.  These Care Teams include your primary Cardiologist (physician) and Advanced Practice Providers (APPs -  Physician Assistants and Nurse Practitioners) who all work together to provide you with the care you need, when you need it.  We recommend signing up for the patient portal called "MyChart".  Sign up information is provided on this After Visit Summary.  MyChart is used to connect with patients for Virtual Visits (Telemedicine).  Patients are able to view lab/test results, encounter notes, upcoming appointments, etc.  Non-urgent messages can be sent to your provider as well.   To learn more about what you can do with MyChart, go to [email or web address].    Your next appointment:   4-5 month(s)  Provider:   Thurmon Fair, MD

## 2023-05-15 LAB — BASIC METABOLIC PANEL
BUN/Creatinine Ratio: 20 (ref 10–24)
BUN: 27 mg/dL (ref 8–27)
CO2: 23 mmol/L (ref 20–29)
Calcium: 8.6 mg/dL (ref 8.6–10.2)
Chloride: 104 mmol/L (ref 96–106)
Creatinine, Ser: 1.38 mg/dL — ABNORMAL HIGH (ref 0.76–1.27)
Glucose: 141 mg/dL — ABNORMAL HIGH (ref 70–99)
Potassium: 5.2 mmol/L (ref 3.5–5.2)
Sodium: 142 mmol/L (ref 134–144)
eGFR: 50 mL/min/{1.73_m2} — ABNORMAL LOW (ref >59)

## 2023-05-15 LAB — MAGNESIUM: Magnesium: 1.3 mg/dL — ABNORMAL LOW (ref 1.6–2.3)

## 2023-05-17 MED ORDER — APIXABAN 5 MG PO TABS: 5.0000 mg | ORAL_TABLET | Freq: Two times a day (BID) | ORAL | 3 refills | Status: DC

## 2023-05-17 MED ORDER — MAGNESIUM OXIDE 400 MG PO CAPS: 400.0000 mg | ORAL_CAPSULE | Freq: Every day | ORAL | 3 refills | Status: DC

## 2023-05-17 NOTE — Addendum Note (Signed)
Addended by: Kurtis Bushman on: 05/17/2023 10:35 AM   Modules accepted: Orders

## 2023-05-24 ENCOUNTER — Telehealth: Payer: Self-pay | Admitting: Cardiovascular Disease

## 2023-05-24 DIAGNOSIS — Z79899 Other long term (current) drug therapy: Secondary | ICD-10-CM

## 2023-05-24 NOTE — Telephone Encounter (Signed)
Spoke with patient and he states since starting eliquis, amlodipine, and magnesium he has had swelling in his legs and ankles. Denies any weight gain, chest pain or SOB. I did inform patient amlodipine can cause swelling. He would like to know if you can prescribe something that will not make him swell?

## 2023-05-24 NOTE — Telephone Encounter (Signed)
Pt c/o medication issue:  1. Name of Medication:   apixaban (ELIQUIS) 5 MG TABS tablet    amLODipine (NORVASC) 2.5 MG tablet    Magnesium Oxide 400 MG CAPS   2. How are you currently taking this medication (dosage and times per day)?   3. Are you having a reaction (difficulty breathing--STAT)? No  4. What is your medication issue? Pt states that he is having swelling in legs and feet and thinks it could be from the above medications. Please advise

## 2023-05-24 NOTE — Telephone Encounter (Signed)
Please let Shaun Mclaughlin know I appreciate him keeping [place] updated. He was previosly on furosemide, feel his swelling is likely do to discontinuation. Would recommend starting furosemide 20 mg daily with recheck of BMET in 10 days. Would continue amlodipine for now, if no improvement with lasix can then reconsider discontinuation.

## 2023-05-26 MED ORDER — FUROSEMIDE 20 MG PO TABS
20.0000 mg | ORAL_TABLET | Freq: Every day | ORAL | 3 refills | Status: DC
Start: 2023-05-26 — End: 2023-07-08

## 2023-05-26 NOTE — Telephone Encounter (Signed)
Spoke with patient and he is aware to start lasix 20 mg daily. He will come do BMET on 11/11. Lab was already ordered.

## 2023-06-04 ENCOUNTER — Ambulatory Visit (HOSPITAL_COMMUNITY)
Admission: RE | Admit: 2023-06-04 | Discharge: 2023-06-04 | Disposition: A | Discharge status: Home / Self Care | Payer: Medicare Other | Source: Ambulatory Visit | Attending: Family Medicine | Admitting: Family Medicine

## 2023-06-04 DIAGNOSIS — E041 Nontoxic single thyroid nodule: Secondary | ICD-10-CM | POA: Diagnosis not present

## 2023-06-04 MED ORDER — LIDOCAINE HCL (PF) 1 % IJ SOLN
5.0000 mL | Freq: Once | INTRAMUSCULAR | Status: AC
  Administered 2023-06-04: 5 mL via INTRADERMAL

## 2023-06-07 ENCOUNTER — Telehealth: Payer: Self-pay

## 2023-06-07 DIAGNOSIS — I2510 Atherosclerotic heart disease of native coronary artery without angina pectoris: Secondary | ICD-10-CM | POA: Diagnosis not present

## 2023-06-07 LAB — CYTOLOGY - NON PAP

## 2023-06-07 NOTE — Telephone Encounter (Signed)
Patient here for lab draw. While here patient report that he was having increased swelling to beth legs since starting Amlodipine 2.5 mg . He is taking Lasix 20 mg as instructed. Patient has not taken Amlodipine 2.5 mg for the last 3 days. He deny any other symptoms at this time. Please advise.

## 2023-06-07 NOTE — Telephone Encounter (Signed)
Below message relayed to patient. He will have his blood pressure checked regularly Patent verbalized understanding and agree.

## 2023-06-07 NOTE — Telephone Encounter (Signed)
If his BP is 140/90 or less, he can stay off the amlodipine. ANkle swelling is a common side effect of amlodipine.

## 2023-06-08 LAB — BASIC METABOLIC PANEL
BUN/Creatinine Ratio: 14 (ref 10–24)
BUN: 19 mg/dL (ref 8–27)
CO2: 26 mmol/L (ref 20–29)
Calcium: 8.5 mg/dL — ABNORMAL LOW (ref 8.6–10.2)
Chloride: 101 mmol/L (ref 96–106)
Creatinine, Ser: 1.35 mg/dL — ABNORMAL HIGH (ref 0.76–1.27)
Glucose: 125 mg/dL — ABNORMAL HIGH (ref 70–99)
Potassium: 5.1 mmol/L (ref 3.5–5.2)
Sodium: 141 mmol/L (ref 134–144)
eGFR: 51 mL/min/{1.73_m2} — ABNORMAL LOW (ref >59)

## 2023-06-08 LAB — MAGNESIUM: Magnesium: 1.1 mg/dL — ABNORMAL LOW (ref 1.6–2.3)

## 2023-06-08 NOTE — Addendum Note (Signed)
Addended by: Kurtis Bushman on: 06/08/2023 11:20 AM   Modules accepted: Orders

## 2023-06-14 DIAGNOSIS — I872 Venous insufficiency (chronic) (peripheral): Secondary | ICD-10-CM | POA: Diagnosis not present

## 2023-06-14 DIAGNOSIS — R058 Other specified cough: Secondary | ICD-10-CM | POA: Diagnosis not present

## 2023-06-14 DIAGNOSIS — L03116 Cellulitis of left lower limb: Secondary | ICD-10-CM | POA: Diagnosis not present

## 2023-06-14 DIAGNOSIS — I739 Peripheral vascular disease, unspecified: Secondary | ICD-10-CM | POA: Diagnosis not present

## 2023-06-17 DIAGNOSIS — L570 Actinic keratosis: Secondary | ICD-10-CM | POA: Diagnosis not present

## 2023-06-22 ENCOUNTER — Telehealth: Payer: Self-pay | Admitting: Cardiovascular Disease

## 2023-06-22 ENCOUNTER — Other Ambulatory Visit: Payer: Self-pay

## 2023-06-22 DIAGNOSIS — I10 Essential (primary) hypertension: Secondary | ICD-10-CM

## 2023-06-22 NOTE — Telephone Encounter (Signed)
Patient was in office [facility] NP went and talked to him

## 2023-06-22 NOTE — Telephone Encounter (Signed)
Left message to call back  

## 2023-06-22 NOTE — Telephone Encounter (Signed)
Patient complaining of swelling legs since taking new BP meds the last two weeks. Please advise on what to do in regards to this. Med is amlodipine.

## 2023-06-23 ENCOUNTER — Telehealth: Payer: Self-pay | Admitting: Cardiovascular Disease

## 2023-06-23 DIAGNOSIS — E875 Hyperkalemia: Secondary | ICD-10-CM

## 2023-06-23 DIAGNOSIS — I10 Essential (primary) hypertension: Secondary | ICD-10-CM

## 2023-06-23 LAB — BASIC METABOLIC PANEL
BUN/Creatinine Ratio: 16 (ref 10–24)
BUN: 33 mg/dL — ABNORMAL HIGH (ref 8–27)
CO2: 23 mmol/L (ref 20–29)
Calcium: 8.9 mg/dL (ref 8.6–10.2)
Chloride: 96 mmol/L (ref 96–106)
Creatinine, Ser: 2.08 mg/dL — ABNORMAL HIGH (ref 0.76–1.27)
Glucose: 139 mg/dL — ABNORMAL HIGH (ref 70–99)
Potassium: 5.8 mmol/L (ref 3.5–5.2)
Sodium: 138 mmol/L (ref 134–144)
eGFR: 30 mL/min/{1.73_m2} — ABNORMAL LOW (ref >59)

## 2023-06-23 LAB — MAGNESIUM: Magnesium: 1.5 mg/dL — ABNORMAL LOW (ref 1.6–2.3)

## 2023-06-23 NOTE — Telephone Encounter (Signed)
Received call from Clydie Braun with [facility] with critical results for patient. Patient's potassium is 5.8. Spoke to Dr [employer] with lab results. No changes in medication per Dr Royann Shivers repeat labs in 4 weeks. Called and spoke to patient to inform him that his potassium was 5.8 and labs were ordered repeat in 4 weeks. Patient verbalized understanding and agree. No questions at this time. Patient is waiting for a call concerning an increase in his Lasix. Please advise.

## 2023-06-23 NOTE — Telephone Encounter (Signed)
Attempted to call patient. Per patient's wife asked to call back in 30 minutes.

## 2023-06-23 NOTE — Addendum Note (Signed)
Addended by: Clotilde Dieter on: 06/23/2023 02:38 PM   Modules accepted: Orders

## 2023-06-23 NOTE — Telephone Encounter (Signed)
Called and spoke to patient. Below message relayed. Patient stated he is not taking any potassium supplements. Went over ED precautions with patient. Advised to watch for chest pain, SOB, nausea and vomiting, palpitations and slow, weak or irregular heart rate. Patient verbalized understanding and agree. No questions at this time.     Reather Littler D, NP  to [facility] N[facility] Triage      06/23/23 12:28 PM Discussed elevated potassium and creatinine with Dr. Royann Shivers as well as patients increased lower extremity edema. Per Dr. Salena Saner he needs recheck of BMET and Magnesium level next week in addition to a BNP. At this time given increase in creatinine he should continue lasix at 20 mg daily, and decrease magnesium to 400 mg once daily, please ensure he is not taking any potassium supplements. Please remind him to decrease his salt intake and elevate his lower extremities when able. He also needs referral to nephrology if he does not already see a nephrologist. Please review ED precautions and arrange a follow up visit in two weeks. Thanks, Reather Littler, NP

## 2023-06-23 NOTE — Telephone Encounter (Signed)
Spoke with Shaun Mclaughlin yesterday after he had his labs drawn.  He noted that he had stopped his amlodipine and started on Lasix 20 mg daily.  He notes that he still does have some lower extremity edema, will plan to increase Lasix to 40 mg daily pending his lab results.  Encouraged decreased salt intake, leg elevation and wearing of compression stockings.  He appreciated me speaking with him.

## 2023-06-23 NOTE — Telephone Encounter (Signed)
[employer] calling with critical lab

## 2023-06-29 DIAGNOSIS — C44329 Squamous cell carcinoma of skin of other parts of face: Secondary | ICD-10-CM | POA: Diagnosis not present

## 2023-07-01 ENCOUNTER — Telehealth: Payer: Self-pay | Admitting: Radiation Oncology

## 2023-07-01 NOTE — Telephone Encounter (Signed)
12/5 @ 10:35 am Left voicemail on both patient's contact numbers for patient to call our office to be schedule for consult

## 2023-07-03 ENCOUNTER — Other Ambulatory Visit: Payer: Self-pay

## 2023-07-03 ENCOUNTER — Emergency Department (HOSPITAL_BASED_OUTPATIENT_CLINIC_OR_DEPARTMENT_OTHER)
Admission: EM | Admit: 2023-07-03 | Discharge: 2023-07-03 | Disposition: A | Discharge status: Home / Self Care | Payer: Medicare Other | Attending: Emergency Medicine | Admitting: Emergency Medicine

## 2023-07-03 ENCOUNTER — Encounter (HOSPITAL_BASED_OUTPATIENT_CLINIC_OR_DEPARTMENT_OTHER): Payer: Self-pay

## 2023-07-03 DIAGNOSIS — I499 Cardiac arrhythmia, unspecified: Secondary | ICD-10-CM | POA: Diagnosis not present

## 2023-07-03 DIAGNOSIS — L7622 Postprocedural hemorrhage and hematoma of skin and subcutaneous tissue following other procedure: Secondary | ICD-10-CM | POA: Insufficient documentation

## 2023-07-03 DIAGNOSIS — Z7901 Long term (current) use of anticoagulants: Secondary | ICD-10-CM | POA: Diagnosis not present

## 2023-07-03 DIAGNOSIS — R58 Hemorrhage, not elsewhere classified: Secondary | ICD-10-CM | POA: Diagnosis not present

## 2023-07-03 DIAGNOSIS — T148XXA Other injury of unspecified body region, initial encounter: Secondary | ICD-10-CM

## 2023-07-03 DIAGNOSIS — R0902 Hypoxemia: Secondary | ICD-10-CM | POA: Diagnosis not present

## 2023-07-03 NOTE — Discharge Instructions (Signed)
Today you were seen for surgical site bleeding.  If the site begins to bleed again please apply direct pressure for at least 10 minutes.  You may continue taking your blood thinner.  Thank you for letting us treat you today. After performing a physical exam, I feel you are safe to go home. Please follow up with your PCP in the next several days and provide them with your records from this visit. Return to the Emergency Room if pain becomes severe or symptoms worsen.

## 2023-07-03 NOTE — ED Triage Notes (Signed)
Patient presents to ED via [facility] with c/o post op complication from facial biopsy completed on Wednesday. Patient reports he was unable to get the bleeding to cease. Is on blood thinners. AAOx4.

## 2023-07-03 NOTE — ED Provider Notes (Signed)
[place] EMERGENCY DEPARTMENT AT [facility] Provider Note   CSN: [id number] Arrival date & time: 07/03/23  1036     History  Chief Complaint  Patient presents with   Facial bleeding (biopsy site)   Post-op Problem    Shaun Mclaughlin is a 88 y.o. male presents today for postop complication from a vaginal biopsy that was completed on Wednesday.  Patient states that his stitches started bleeding this morning and he was unable to get the bleeding to stop.  Patient denies dizziness, shortness of breath, confusion, fever, or chills.  Patient states that he is on blood thinners but did not take his Eliquis this morning.  HPI     Home Medications Prior to Admission medications   Medication Sig Start Date End Date Taking? Authorizing Provider  albuterol (PROVENTIL HFA;VENTOLIN HFA) 108 (90 Base) MCG/ACT inhaler Inhale 2 puffs into the lungs every 6 (six) hours as needed for wheezing or shortness of breath. Patient not taking: Reported on 05/14/2023 02/19/17   Croitoru, Mihai, MD  amLODipine (NORVASC) 2.5 MG tablet Take 1 tablet (2.5 mg total) by mouth daily. 05/14/23 08/12/23  Reather Littler D, NP  apixaban (ELIQUIS) 5 MG TABS tablet Take 1 tablet (5 mg total) by mouth 2 (two) times daily. 05/17/23 05/16/24  Reather Littler D, NP  atorvastatin (LIPITOR) 40 MG tablet Take 1 tablet (40 mg total) by mouth daily. 03/20/16   Croitoru, Mihai, MD  Cyanocobalamin 500 MCG SUBL Take 500 mcg by mouth daily at 6 (six) AM. 09/08/22   [provider]  furosemide (LASIX) 20 MG tablet Take 1 tablet (20 mg total) by mouth daily. 05/26/23 08/24/23  Reather Littler D, NP  KRILL OIL PO Take 1 capsule by mouth daily.     [provider]  latanoprost (XALATAN) 0.005 % ophthalmic solution Place 1 drop into both eyes at bedtime. 09/05/19   [provider]  Magnesium Oxide 400 MG CAPS Take 1 capsule (400 mg total) by mouth daily. 05/17/23 05/16/24  Reather Littler D, NP  metoprolol tartrate  (LOPRESSOR) 25 MG tablet TAKE 1 TABLET BY MOUTH TWICE DAILY 09/03/22   Croitoru, Rachelle Hora, MD  omeprazole (PRILOSEC) 20 MG capsule Take 20 mg by mouth daily as needed (heartburn).     [provider]  propylthiouracil (PTU) 50 MG tablet Take 50 mg by mouth daily.    [provider]  triamcinolone (NASACORT) 55 MCG/ACT AERO nasal inhaler Place 2 sprays into the nose daily as needed (ALLERGIES).    [provider]  trolamine salicylate (ASPERCREME) 10 % cream Apply 1 application topically as needed for muscle pain.    [provider]      Allergies    Azithromycin and Cefuroxime    Review of Systems   Review of Systems  Skin:  Positive for wound.    Physical Exam Updated Vital Signs BP 136/61 (BP Location: Right Arm)   Pulse 77   Temp 98.8 F (37.1 C) (Oral)   Resp 18   SpO2 100%  Physical Exam Vitals and nursing note reviewed.  Constitutional:      General: He is not in acute distress.    Appearance: He is well-developed.  HENT:     Head: Normocephalic and atraumatic.      Comments: Approximately 8 cm incision on left side of patient's face extending from temple to angle of jaw.  Incision is hemostatic upon physical exam.  Incision has no erythema or purulent discharge.  Left Ear: Tympanic membrane normal.     Ears:     Comments: Small scab noted on left ear canal which is hemostatic at this time. Eyes:     Conjunctiva/sclera: Conjunctivae normal.  Cardiovascular:     Rate and Rhythm: Normal rate and regular rhythm.     Pulses: Normal pulses.     Heart sounds: No murmur heard. Pulmonary:     Effort: Pulmonary effort is normal. No respiratory distress.     Breath sounds: Normal breath sounds.  Abdominal:     Palpations: Abdomen is soft.     Tenderness: There is no abdominal tenderness.  Musculoskeletal:        General: No swelling.     Cervical back: Neck supple.  Skin:    General: Skin is warm and dry.     Capillary Refill: Capillary  refill takes less than 2 seconds.  Neurological:     Mental Status: He is alert.  Psychiatric:        Mood and Affect: Mood normal.     ED Results / Procedures / Treatments   Labs (all labs ordered are listed, but only abnormal results are displayed) Labs Reviewed - No data to display  EKG None  Radiology No results found.  Procedures Procedures    Medications Ordered in ED Medications - No data to display  ED Course/ Medical Decision Making/ A&P                                 Medical Decision Making  This patient presents to the ED with chief complaint(s) of postop complication with pertinent past medical history of A-fib which further complicates the presenting complaint. The complaint involves an extensive differential diagnosis and also carries with it a high risk of complications and morbidity.    The differential diagnosis includes postop bleeding  ED Course and Reassessment: Redressed incision site and educated on proper way to apply pressure if bleeding begins again  Consultation: - Consulted or discussed management/test interpretation w/ external professional: None  Consideration for admission or further workup: Considered for admission and further workup however patient's physical exam and vital signs have all been reassuring.  Patient's incision site is been redressed and patient has been educated on applying direct pressure if bleeding occurs again.  Patient will follow-up with dermatology early next week.         Final Clinical Impression(s) / ED Diagnoses Final diagnoses:  Bleeding from wound    Rx / DC Orders ED Discharge Orders     None         Dolphus Jenny, PA-C 07/03/23 1243    Alvira Monday, MD 07/05/23 1127

## 2023-07-03 NOTE — ED Notes (Signed)
This RN arranged transportation with the non-emergent line with [facility]. This RN made attempted four times to reach [facility] for transportation with no success. An ETA was unable to be provided for transportation at this time.

## 2023-07-03 NOTE — ED Notes (Signed)
Surgical site cleaned and new dressing applied.

## 2023-07-06 ENCOUNTER — Telehealth: Payer: Self-pay

## 2023-07-06 DIAGNOSIS — I5042 Chronic combined systolic (congestive) and diastolic (congestive) heart failure: Secondary | ICD-10-CM | POA: Diagnosis not present

## 2023-07-06 DIAGNOSIS — T148XXA Other injury of unspecified body region, initial encounter: Secondary | ICD-10-CM | POA: Diagnosis not present

## 2023-07-06 DIAGNOSIS — I10 Essential (primary) hypertension: Secondary | ICD-10-CM | POA: Diagnosis not present

## 2023-07-06 DIAGNOSIS — E875 Hyperkalemia: Secondary | ICD-10-CM | POA: Diagnosis not present

## 2023-07-06 NOTE — Telephone Encounter (Signed)
Thanks, I agree 4 days is OK

## 2023-07-06 NOTE — Telephone Encounter (Signed)
Pt came into the office today for repeat lab work. He stopped by DOD pod per lab tech. Pt recently had a procedure, however I can not find documentation of said procedure. Pt was recently seen in the ED for bleeding from incision (approximately 8 cm incision on left side of patient's face extending from temple to angle of jaw). Pt was told that he can hold Eliquis. Pt is asking how long is it recommended to hold Eliquis. I advised pt that the shortest amount of time is recommended. Pt states he was told no more that 4 days, told pt that this sounds appropriate. Advised pt to resume eliquis as soon as possible. Advised pt I would also send a message over to Dr. Royann Shivers to keep in the loop. Pt verbalizes understanding.

## 2023-07-07 ENCOUNTER — Telehealth: Payer: Self-pay | Admitting: Emergency Medicine

## 2023-07-07 ENCOUNTER — Ambulatory Visit: Payer: Medicare Other

## 2023-07-07 ENCOUNTER — Ambulatory Visit: Payer: Medicare Other | Admitting: Radiation Oncology

## 2023-07-07 DIAGNOSIS — L0889 Other specified local infections of the skin and subcutaneous tissue: Secondary | ICD-10-CM | POA: Diagnosis not present

## 2023-07-07 LAB — BASIC METABOLIC PANEL
BUN/Creatinine Ratio: 29 — ABNORMAL HIGH (ref 10–24)
BUN: 45 mg/dL — ABNORMAL HIGH (ref 8–27)
CO2: 23 mmol/L (ref 20–29)
Calcium: 8.9 mg/dL (ref 8.6–10.2)
Chloride: 104 mmol/L (ref 96–106)
Creatinine, Ser: 1.55 mg/dL — ABNORMAL HIGH (ref 0.76–1.27)
Glucose: 163 mg/dL — ABNORMAL HIGH (ref 70–99)
Potassium: 4.9 mmol/L (ref 3.5–5.2)
Sodium: 139 mmol/L (ref 134–144)
eGFR: 43 mL/min/{1.73_m2} — ABNORMAL LOW (ref >59)

## 2023-07-07 LAB — MAGNESIUM: Magnesium: 1.2 mg/dL — ABNORMAL LOW (ref 1.6–2.3)

## 2023-07-07 LAB — BRAIN NATRIURETIC PEPTIDE: BNP: 879.2 pg/mL — ABNORMAL HIGH (ref 0.0–100.0)

## 2023-07-07 MED ORDER — MAGNESIUM OXIDE 400 MG PO CAPS: 400.0000 mg | ORAL_CAPSULE | Freq: Two times a day (BID) | ORAL | 3 refills | Status: DC

## 2023-07-07 NOTE — Addendum Note (Signed)
Addended by: Scheryl Marten on: 07/07/2023 10:43 AM   Modules accepted: Orders

## 2023-07-07 NOTE — Telephone Encounter (Signed)
Shaun Fair, MD 07/06/2023  5:43 PM EST Back to Top    Magnesium still very low, but potassium is normal and kidney parameters are better. Please stop the omeprazole, which prevents magnesium absorption (as due all the other PPI meds such as Nexium, Protonix, etc). OK to take OTC famotidine (Pepcid)/ranitidine (Zantac) or nizatidine (Axid). Do not take cimetidine (Tagamet) due to drug interactions. Increase the magnesium supplement to 400 mg twice daily. (Notes show current dose is 400 mg daily although a dose increase was recommended 2 weeks ago. If he is already taking Mag 2 twice daily , increase to 3 times daily, but cut back to twice daily if this causes diarrhea).   Went over the information above with the patient. He states that he is currently taking Magnesium once a day. Instructed him to increase it to twice a day. Also had him write down the 3 different medications that he can use after discontinuing the Omeprazole. He also has an appointment tomorrow with one of our APP's. He verbalized understanding of all instructions.

## 2023-07-07 NOTE — Progress Notes (Unsigned)
Cardiology Office Note    Date:  07/08/2023  ID:  Shaun Mclaughlin, DOB 02/03/1935, MRN [id number] PCP:  Tally Joe, MD  Cardiologist:  Thurmon Fair, MD  Electrophysiologist:  None   Chief Complaint: Lower extremity edema   History of Present Illness: .    Shaun Mclaughlin is a 88 y.o. male with visit-pertinent history of permanent atrial fibrillation, peripheral arterial disease, coronary artery disease (stent RCA 2003), HFrEF with improved EF, CKD stage III, PAD (last revascularization left subclavian stent 2014), left renal artery stenosis, COPD, controlled type 2 diabetes mellitus and hypertension.  He also has a history of bradycardia and bifascicular block, Dr. Royann Shivers suspected patient may require pacemaker at some point the future.  In 2015 LVEF was 35%, however improved to 5055% by March 2023.  Echocardiogram in March 2023 also revealed severely elevated pulmonary artery systolic pressure.  He is on Eliquis 2.5 mg twice a day based on age and renal function.  He is followed by [facility].  On 04/15/2023 he presented to the emergency room after having lab work completed at his PCPs office which showed AKI and hyperkalemia, the day prior he had a fall, reportedly he tried to turn and his foot was caught he then fell backward into refrigerator and injured his elbow.  On arrival to [facility] his sodium was 129, potassium 6.2, creatinine elevated at 3.45, magnesium 0.6.  EKG showed atrial fibrillation, right bundle branch block, heart rate borderline elevated at 100.  White blood count was normal.  CT of the cervical spine showed no acute fracture.  CT of the head showed no acute intracranial abnormality however does show chronic atrophic and ischemic change.  CT of the chest showed small partially loculated right pleural effusion, incidental finding of 1.8 cm left thyroid nodule.  For significant hyperkalemia he was treated with Lokelma, insulin with dextrose and IV  calcium.  He was also treated with IV magnesium.  Renal ultrasound showed no sign of hydronephrosis.  His home Lasix 80 mg daily and losartan 50 mg daily had been held.  Creatinine improved with hydration from 3.45-2.52, sodium improved to 130 and potassium 4.3.  Mr. Fawcett was last seen in clinic on 05/14/2023.  He reported that he was doing very well overall, he been working with physical therapy at his living facility.  He was started on amlodipine 2.5 mg for blood pressure, this was discontinued for lower extremity edema and patient was restarted on Lasix 20 mg daily.   Today he presents for follow-up.  He reports that he is overall doing well.  He notes that he has some ongoing lower extremity edema and has had some mild shortness of breath recently.  He denies any orthopnea or PND.  On chart review his weight has increased since last office visit 2 months ago.  He notes that he is trying to stay well-hydrated with water given fluctuations in his renal function.  He plans to increase his magnesium as instructed and continues to hold his PPI.  He reports that he had some skin cancer recently removed and is currently holding his Eliquis, plans to resume within the next 2 days.  Labwork independently reviewed: 07/06/23: Sodium 139, potassium 4.9, creatinine 1.55, magnesium 1.2  ROS: .   Today he denies chest pain, fatigue, palpitations, melena, hematuria, hemoptysis, diaphoresis, weakness, presyncope, syncope, orthopnea, and PND.  All other systems are reviewed and otherwise negative. Studies Reviewed: Marland Kitchen    EKG:  EKG  is not ordered today.   CV Studies:  Cardiac Studies & Procedures     STRESS TESTS  NM MYOCAR MULTI W/SPECT W 06/02/2012  ECHOCARDIOGRAM  ECHOCARDIOGRAM COMPLETE 10/09/2021  Narrative ECHOCARDIOGRAM REPORT    Patient Name:   Shaun Mclaughlin Date of Exam: 10/09/2021 Medical Rec #:  [id number]       Height:       66.0 in Accession #:    [id number]      Weight:       148.6  lb Date of Birth:  February 17, 1935       BSA:          1.763 m Patient Age:    87 years        BP:           132/72 mmHg Patient Gender: M               HR:           75 bpm. Exam Location:  [address]  Procedure: 2D Echo, Cardiac Doppler, Color Doppler and Intracardiac Opacification Agent  Indications:    I50.9 CHF  History:        Patient has prior history of Echocardiogram examinations, most recent 06/08/2014. CHF, CAD, PVD and COPD, Arrythmias:Bradycardia; Risk Factors:Hypertension, Diabetes, Dyslipidemia and Current Smoker.  Sonographer:    Jorje Guild BS, RDCS Referring Phys: [phone] [facility] CR[facility]U   Sonographer Comments: Technically difficult study due to poor echo windows and suboptimal apical window. IMPRESSIONS   1. Left ventricular ejection fraction, by estimation, is 50 to 55%. The left ventricle has low normal function. The left ventricle has no regional wall motion abnormalities. There is mild left ventricular hypertrophy. Left ventricular diastolic function could not be evaluated. 2. Right ventricular systolic function was not well visualized. The right ventricular size is normal. There is severely elevated pulmonary artery systolic pressure. 3. Left atrial size was moderately dilated. 4. The mitral valve is grossly normal. Trivial mitral valve regurgitation. No evidence of mitral stenosis. Moderate mitral annular calcification. 5. The aortic valve was not well visualized. There is moderate calcification of the aortic valve. There is mild thickening of the aortic valve. Aortic valve regurgitation is not visualized. Aortic valve sclerosis/calcification is present, without any evidence of aortic stenosis. 6. The inferior vena cava is dilated in size with >50% respiratory variability, suggesting right atrial pressure of 8 mmHg.  Comparison(s): No significant change from prior study. 06/08/14 EF 45-50%. PA pressure .  FINDINGS Left Ventricle: Left ventricular ejection  fraction, by estimation, is 50 to 55%. The left ventricle has low normal function. The left ventricle has no regional wall motion abnormalities. Definity contrast agent was given IV to delineate the left ventricular endocardial borders. The left ventricular internal cavity size was normal in size. There is mild left ventricular hypertrophy. Left ventricular diastolic function could not be evaluated.  Right Ventricle: The right ventricular size is normal. Right vetricular wall thickness was not well visualized. Right ventricular systolic function was not well visualized. There is severely elevated pulmonary artery systolic pressure. The tricuspid regurgitant velocity is 3.85 m/s, and with an assumed right atrial pressure of 8 mmHg, the estimated right ventricular systolic pressure is 67.3 mmHg.  Left Atrium: Left atrial size was moderately dilated.  Right Atrium: Right atrial size was normal in size.  Pericardium: There is no evidence of pericardial effusion.  Mitral Valve: The mitral valve is grossly normal. There is mild thickening of the mitral valve leaflet(s). There  is mild calcification of the mitral valve leaflet(s). Moderate mitral annular calcification. Trivial mitral valve regurgitation. No evidence of mitral valve stenosis.  Tricuspid Valve: The tricuspid valve is grossly normal. Tricuspid valve regurgitation is mild . No evidence of tricuspid stenosis.  Aortic Valve: The aortic valve was not well visualized. There is moderate calcification of the aortic valve. There is mild thickening of the aortic valve. Aortic valve regurgitation is not visualized. Aortic valve sclerosis/calcification is present, without any evidence of aortic stenosis.  Pulmonic Valve: The pulmonic valve was not well visualized. Pulmonic valve regurgitation is mild to moderate. No evidence of pulmonic stenosis.  Aorta: The aortic arch was not well visualized and the aortic root and ascending aorta are structurally  normal, with no evidence of dilitation.  Venous: The inferior vena cava is dilated in size with greater than 50% respiratory variability, suggesting right atrial pressure of 8 mmHg.  IAS/Shunts: The atrial septum is grossly normal.   LEFT VENTRICLE PLAX 2D LVIDd:         5.20 cm LVIDs:         3.90 cm LV PW:         1.30 cm LV IVS:        1.50 cm LVOT diam:     2.40 cm LVOT Area:     4.52 cm   RIGHT VENTRICLE            IVC RV Basal diam:  3.30 cm    IVC diam: 2.20 cm RVSP:           67.3 mmHg  LEFT ATRIUM             Index        RIGHT ATRIUM           Index LA diam:        4.90 cm 2.78 cm/m   RA Pressure: 8.00 mmHg LA Vol (A2C):   63.2 ml 35.86 ml/m  RA Area:     15.20 cm LA Vol (A4C):   43.7 ml 24.79 ml/m  RA Volume:   34.80 ml  19.74 ml/m LA Biplane Vol: 54.1 ml 30.69 ml/m  AORTA Ao Root diam: 3.60 cm Ao Asc diam:  3.30 cm  TRICUSPID VALVE TR Peak grad:   59.3 mmHg TR Vmax:        385.00 cm/s Estimated RAP:  8.00 mmHg RVSP:           67.3 mmHg  SHUNTS Systemic Diam: 2.40 cm  Jodelle Red MD Electronically signed by Jodelle Red MD Signature Date/Time: 10/09/2021/9:28:33 PM    Final   MONITORS  LONG TERM MONITOR (3-14 DAYS) 10/08/2021  Narrative  The dominant rhythm is atrial flutter with controlled ventricular response (atrial flutter is a permanent arrhythmia).  At rest, there appears to be good ventricular rate control without any episodes of severe bradycardia.  There are relatively frequent episodes of rapid ventricular response during periods of expected activity (during the daytime hours). There is one episode of extreme tachycardia, probably due to atrial flutter with 1: 1 AV conduction and a rate of 210 bpm.  Also seen are relatively frequent premature ventricular contractions including rare couplets and triplets.  Abnormal event monitor due to permanent atrial flutter with frequent episodes of rapid ventricular rates and  occasional extreme tachycardia, as well as relatively frequent PVCs (roughly 5% of all beats).  Severe bradycardia/high-grade AV block is not seen.  Patch Wear Time:  3 days and 1 hours (2023-03-04T14:34:34-0500  to 2023-03-07T15:38:12-498)  Atrial Flutter occurred continuously (100% burden), ranging from 50-210 bpm (avg of 85 bpm). Some episodes of Atrial Flutter conducted with possible aberrancy. Isolated VEs were occasional (4.8%, 17109), VE Couplets were rare (<1.0%, 57), and VE Triplets were rare (<1.0%, 3). Ventricular Trigeminy was present.            Current Reported Medications:.    Current Meds  Medication Sig   albuterol (PROVENTIL HFA;VENTOLIN HFA) 108 (90 Base) MCG/ACT inhaler Inhale 2 puffs into the lungs every 6 (six) hours as needed for wheezing or shortness of breath.   apixaban (ELIQUIS) 2.5 MG TABS tablet Take 1 tablet (2.5 mg total) by mouth 2 (two) times daily.   apixaban (ELIQUIS) 2.5 MG TABS tablet Take 1 tablet (2.5 mg total) by mouth 2 (two) times daily.   atorvastatin (LIPITOR) 40 MG tablet Take 1 tablet (40 mg total) by mouth daily.   Cyanocobalamin 500 MCG SUBL Take 500 mcg by mouth daily at 6 (six) AM.   furosemide (LASIX) 40 MG tablet Take 1 tablet (40 mg total) by mouth daily.   KRILL OIL PO Take 1 capsule by mouth daily.    latanoprost (XALATAN) 0.005 % ophthalmic solution Place 1 drop into both eyes at bedtime.   Magnesium Oxide 400 MG CAPS Take 1 capsule (400 mg total) by mouth in the morning and at bedtime.   metoprolol tartrate (LOPRESSOR) 25 MG tablet TAKE 1 TABLET BY MOUTH TWICE DAILY   propylthiouracil (PTU) 50 MG tablet Take 50 mg by mouth daily.   triamcinolone (NASACORT) 55 MCG/ACT AERO nasal inhaler Place 2 sprays into the nose daily as needed (ALLERGIES).   trolamine salicylate (ASPERCREME) 10 % cream Apply 1 application topically as needed for muscle pain.   [DISCONTINUED] amLODipine (NORVASC) 2.5 MG tablet Take 1 tablet (2.5 mg total) by mouth  daily.   [DISCONTINUED] apixaban (ELIQUIS) 5 MG TABS tablet Take 1 tablet (5 mg total) by mouth 2 (two) times daily.   [DISCONTINUED] furosemide (LASIX) 20 MG tablet Take 1 tablet (20 mg total) by mouth daily.   Physical Exam:    VS:  BP 120/60 (BP Location: Right Arm, Cuff Size: Normal)   Pulse 73   Ht 5\' 6"  (1.676 m)   Wt 168 lb (76.2 kg)   SpO2 96%   BMI 27.12 kg/m    Wt Readings from Last 3 Encounters:  07/08/23 168 lb (76.2 kg)  05/14/23 156 lb 6.4 oz (70.9 kg)  04/15/23 152 lb 1.9 oz (69 kg)    GEN: Well nourished, well developed in no acute distress NECK: No JVD; No carotid bruits CARDIAC: Irregular RR, no murmurs, rubs, gallops RESPIRATORY:  Clear to auscultation without rales, wheezing or rhonchi  ABDOMEN: Soft, non-tender, non-distended EXTREMITIES:  1+ bilateral lower extremity edema to mid shin; No acute deformity   Asessement and Plan:.    CHF: Last echo on 09/2021 indicated LVEF of with low normal function, no RWMA, mild LVH, diastolic function could not be evaluated.  His Lasix and Losartan were discontinued during admission for AKI and hyperkalemia in 03/2023. With history of hyperkalemia will not restart Losartan.  Recently he noted increased lower extremity edema and was restarted on Lasix 20 mg daily, today he reports ongoing lower extremity edema, BNP elevated at 879.2.  He notes slight shortness of breath.  Denies orthopnea or PND.  On exam he has +1 bilateral lower extremity edema to mid shin. No crackles noted on exam. Recent BMET indicated kidney  function close to baseline with creatinine at 1.55 and potassium at 4.9.  Will increase Lasix to 40 mg in the morning and 20 mg in the afternoon for 3 days, he will then resume 40 mg daily thereafter. He will monitor for increased shortness of breath or lower extremity edema and notify the office if present.  Will recheck BMET, BNP and mag in two weeks.   Permanent atrial fibrillation/bifascicular block: EKG at last visit  showed atrial fibrillation with RBBB and LAFB, unchanged from prior, rate controlled today at 73 bpm. Previously noted by Dr. Royann Shivers that patient has history of bradycardia and bifasciciular bock, possible need for pacemaker in the future. CHA2DS2-VASc Score = 6 [CHF History: 1, HTN History: 1, Diabetes History: 1, Stroke History: 0, Vascular Disease History: 1, Age Score: 2, Gender Score: 0].  Therefore, the patient's annual risk of stroke is 9.7 %. He denies any bleeding problems. Eliquis previously increased given improvement in kidney function, will resume Eliquis 2.5 mg twice daily given age and creatinine now above 1.5. Samples provided for one week. Continue Lopressor 25 mg twice daily.  Renal artery stenosis/CKD stage IIIb/hypomagnesemia:Admitted 04/15/23 for AKI on CKD stage IIIa. Noted to have hyperkalemia, hypomagnesemia and hyponatremia on presentation to the ED on 04/15/2023.Treated with IV fluids and Lokelma, renal ultrasound negative for hydronephrosis.  Last creatinine 1.55.  Magnesium 1.2.  His magnesium was again increased to 400 mg twice daily and again instructed to stop his PPI.  He has been referred to nephrology given fluctuating renal status. Will recheck BMET and Mag in two weeks. Check renal artery duplex given history of renal artery stenosis.    Hypertension: Blood pressure well controlled today at 120/60.  Continue current antihypertensive regimen.   CAD: s/p RCA stenting in 2003. Stable with no anginal symptoms. No indication for ischemic evaluation. No ASA given Eliquis. Continue atorvastatin 40 mg daily and Lopressor 25 mg twice daily.    Left subclavian artery stenosis: History of left subclavian artery stenting, no claudication symptoms however left arm pressures lower than right arm pressure.  Blood pressure should only be obtained in the right arm.   Left thyroid nodule: Noted to have 1.8 cm left thyroid nodule on CT of the chest.   Thyroid ultrasound on 05/06/23  indicated solid nodule in the left inferior thyroid, he is to have fine needle aspiration. Being managed by PCP.     Disposition: F/u with Reather Littler, NP in three weeks.   Signed, Rip Harbour, NP

## 2023-07-08 ENCOUNTER — Ambulatory Visit: Payer: Medicare Other | Attending: Cardiology | Admitting: Cardiology

## 2023-07-08 ENCOUNTER — Encounter: Payer: Self-pay | Admitting: Cardiology

## 2023-07-08 VITALS — BP 120/60 | HR 73 | Ht 66.0 in | Wt 168.0 lb

## 2023-07-08 DIAGNOSIS — I771 Stricture of artery: Secondary | ICD-10-CM

## 2023-07-08 DIAGNOSIS — N1832 Chronic kidney disease, stage 3b: Secondary | ICD-10-CM

## 2023-07-08 DIAGNOSIS — I10 Essential (primary) hypertension: Secondary | ICD-10-CM | POA: Diagnosis not present

## 2023-07-08 DIAGNOSIS — I5042 Chronic combined systolic (congestive) and diastolic (congestive) heart failure: Secondary | ICD-10-CM

## 2023-07-08 DIAGNOSIS — I701 Atherosclerosis of renal artery: Secondary | ICD-10-CM

## 2023-07-08 DIAGNOSIS — I2510 Atherosclerotic heart disease of native coronary artery without angina pectoris: Secondary | ICD-10-CM

## 2023-07-08 DIAGNOSIS — I4821 Permanent atrial fibrillation: Secondary | ICD-10-CM

## 2023-07-08 MED ORDER — FUROSEMIDE 40 MG PO TABS: 40.0000 mg | ORAL_TABLET | Freq: Every day | ORAL | 3 refills | Status: DC

## 2023-07-08 MED ORDER — APIXABAN 2.5 MG PO TABS: 2.5000 mg | ORAL_TABLET | Freq: Two times a day (BID) | ORAL | 3 refills | Status: DC

## 2023-07-08 MED ORDER — APIXABAN 2.5 MG PO TABS: 2.5000 mg | ORAL_TABLET | Freq: Two times a day (BID) | ORAL | Status: DC

## 2023-07-08 NOTE — Patient Instructions (Addendum)
Medication Instructions:  Increase Lasix to 40 mg in the morning and 20 mg at night. After 3 days take Lasix 40 mg once a day Decrease Eliquis to 2.5 mg twice a day *If you need a refill on your cardiac medications before your next appointment, please call your pharmacy*  Lab Work: In 2 weeks have a BMP, BNP, and Mag drawn If you have labs (blood work) drawn today and your tests are completely normal, you will receive your results only by: MyChart Message (if you have MyChart) OR A paper copy in the mail If you have any lab test that is abnormal or we need to change your treatment, we will call you to review the results.  Testing/Procedures: Your physician has requested that you have a renal artery duplex. During this test, an ultrasound is used to evaluate blood flow to the kidneys. Take your medications as you usually do. This will take place at [address] [facility], [address].  No food after 11PM the night before.  Water is OK. (Don't drink liquids if you have been instructed not to for ANOTHER test). Avoid foods that produce bowel gas, for 24 hours prior to exam (see below). No breakfast, no chewing gum, no smoking or carbonated beverages. Patient may take morning medications with water. Come in for test at least 15 minutes early to register.  Please note: We ask at that you not bring children with you during ultrasound (echo/ vascular) testing. Due to room size and safety concerns, children are not allowed in the ultrasound rooms during exams. Our front office staff cannot provide observation of children in our lobby area while testing is being conducted. An adult accompanying a patient to their appointment will only be allowed in the ultrasound room at the discretion of the ultrasound technician under special circumstances. We apologize for any inconvenience.  Follow-Up: At [facility], you and your health needs are our priority.  As part of our continuing mission to provide you  with exceptional heart care, we have created designated Provider Care Teams.  These Care Teams include your primary Cardiologist (physician) and Advanced Practice Providers (APPs -  Physician Assistants and Nurse Practitioners) who all work together to provide you with the care you need, when you need it.  We recommend signing up for the patient portal called "MyChart".  Sign up information is provided on this After Visit Summary.  MyChart is used to connect with patients for Virtual Visits (Telemedicine).  Patients are able to view lab/test results, encounter notes, upcoming appointments, etc.  Non-urgent messages can be sent to your provider as well.   To learn more about what you can do with MyChart, go to [email or web address].    Your next appointment:   3 week(s)  Provider:   Reather Littler, NP

## 2023-07-09 ENCOUNTER — Ambulatory Visit (HOSPITAL_COMMUNITY)
Admission: RE | Admit: 2023-07-09 | Discharge: 2023-07-09 | Disposition: A | Discharge status: Home / Self Care | Payer: Medicare Other | Source: Ambulatory Visit | Attending: Cardiology | Admitting: Cardiology

## 2023-07-09 DIAGNOSIS — I701 Atherosclerosis of renal artery: Secondary | ICD-10-CM

## 2023-07-09 NOTE — Progress Notes (Signed)
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 []  []    Swallowing concerns? []  []    PEG? []  []     Referrals Yes No Comments  Social Work? []  []    Dentistry? []  []    Swallowing therapy? []  []    Nutrition? []  []    Med/Onc? []  []     Safety Issues Yes No Comments  Prior radiation? []  []    Pacemaker/ICD? []  []    Possible current pregnancy? []  []    Is the patient on methotrexate? []  []     Tobacco/Marijuana/Snuff/ETOH use: {:18581}  Past/Anticipated interventions by otolaryngology, if any: {:18581}  Past/Anticipated interventions by medical oncology, if any: {:18581}     Current Complaints / other details:  ***

## 2023-07-12 DIAGNOSIS — W19XXXD Unspecified fall, subsequent encounter: Secondary | ICD-10-CM | POA: Diagnosis not present

## 2023-07-12 DIAGNOSIS — M6281 Muscle weakness (generalized): Secondary | ICD-10-CM | POA: Diagnosis not present

## 2023-07-12 NOTE — Progress Notes (Signed)
Radiation Oncology         [phone] ________________________________  Initial Outpatient Consultation  Name: Shaun Mclaughlin MRN: [id number]  Date: 07/13/2023  DOB: [birth year]  NA:[id number], [name], MD  Mathews Robinsons, MD   REFERRING PHYSICIAN: Mathews Robinsons, MD  DIAGNOSIS:    ICD-10-CM   1. Squamous cell carcinoma of skin of unspecified parts of face  C44.320       Cancer Staging  Squamous cell carcinoma of skin of unspecified parts of face Staging form: Cutaneous Carcinoma of the Head and Neck, AJCC 8th Edition - Pathologic stage from 07/13/2023: pN0, cM0 - Unsigned   Squamous cell carcinoma left mid central temple; s/p Moh's surgery with positive margins.  CHIEF COMPLAINT: Here to discuss management of skin cancer   Cancer Staging  Squamous cell carcinoma of skin of unspecified parts of face Staging form: Cutaneous Carcinoma of the Head and Neck, AJCC 8th Edition - Pathologic stage from 07/13/2023: Stage II (pT2, pN0, cM0) - Signed by Lonie Peak, MD on 07/13/2023 Extraosseous extension: Absent   HISTORY OF PRESENT ILLNESS::Shaun Mclaughlin is a 88 y.o. male with an extensive skin cancer history (see below). Last year, the patient underwent a biopsy of the a left mid temple lesion on 05/04/22 which showed a squamous cell carcinoma He underwent E, D&C of the area. The lesion recurred as a clinically 33x6mm squamous cell carcinoma.and he underwent a repeat biopsy of the left temple on 09/04/22 which again showed SCC. He was recently referred to Dr. Irene Limbo and underwent Mohs of the left temple SCC on 06/29/23. Based on his positive margins and recurrent SCC, Dr. Irene Limbo has referred him to [place] for consideration of radiation therapy to the left temple which we will discuss in detail today.   Of note: The patient recently presented to the ED On 07/03/23 with bleeding from the Mohs operative site (presumably based on location of the wound detailed on ED encounter notes).  No signs of infection at the incision site were appreciated and the wound was cleaned and dressed.   PHOTOS FROM DERMATOLOGY:   Skin cancer history:       Patient reports to be healing well from his surgery. He is changing his bandage every other day and applying hydrogen peroxide to the wound. He is seeing Dr. Irene Limbo later this week for regular follow-up.    PREVIOUS RADIATION THERAPY: No  PAST MEDICAL HISTORY:  has a past medical history of Acute on chronic combined systolic and diastolic congestive heart failure, NYHA class 2 (HCC) (06/29/2014), Arthritis, Atherosclerotic renal artery stenosis, unilateral (HCC), CAD (coronary artery disease),Hx RCA stenting-patent 06/2012  (08/05/2012), Chronic anticoagulation, coumadin for PAF (08/05/2012), Chronic bronchitis (HCC), DM (diabetes mellitus) (HCC) (08/05/2012), External bleeding hemorrhoids (~ 2003; 2008; 2013), GERD (gastroesophageal reflux disease), Hip fracture (HCC) (10/2016), HTN (hypertension) (08/05/2012), Hypercholesteremia, Hyperlipidemia (08/05/2012), Hyperthyroidism, Kidney stone (1980's), PAF (paroxysmal atrial fibrillation), maintaining SR (08/05/2012), Pneumonia (~ 2008), PVD (peripheral vascular disease) with claudication, Lt Upper ext. pain, and known 80-90%distal Lt. subclavian artery stenosis  (08/05/2012), S/P angioplasty with stent to Lt. subclavian artery -Nitrinol stent 08/04/12 (08/05/2012), and Type II diabetes mellitus (HCC).    PAST SURGICAL HISTORY: Past Surgical History:  Procedure Laterality Date   CARDIAC CATHETERIZATION  2008 and Dec 2013   patent coronaries   CARDIOVERSION N/A 07/13/2014   Procedure: CARDIOVERSION;  Surgeon: Thurmon Fair, MD;  Location: [facility]COPY;  Service: Cardiovascular;  Laterality: N/A;   CARDIOVERSION N/A 09/28/2014   Procedure: CARDIOVERSION;  Surgeon: Aris Lot  San Morelle, MD;  Location: [facility] ENDOSCOPY;  Service: Cardiovascular;  Laterality: N/A;   CATARACT EXTRACTION W/ INTRAOCULAR LENS IMPLANT      "right eye" (08/04/2012)   CORONARY ANGIOPLASTY WITH STENT PLACEMENT  2003   patent 12/13   GLAUCOMA SURGERY  2000's   "right eye; had laser procedure  to lower the pressure and prevent glaucoma" (08/04/2012)   HERNIA REPAIR  2000's   "umbilical" (08/04/2012)   INGUINAL HERNIA REPAIR  2000's   "right" (08/04/2012)   KIDNEY STONE SURGERY  1980's   LEFT HEART CATHETERIZATION WITH CORONARY ANGIOGRAM N/A 06/28/2012   Procedure: LEFT HEART CATHETERIZATION WITH CORONARY ANGIOGRAM;  Surgeon: [name], MD;  [facility] C[facility] LAB;  Service: Cardiovascular;  Laterality: N/A;   LITHOTRIPSY  1980's   "imploded in my kidney; ended up having to be cut open in my back" (08/04/2012)   SKIN CANCER EXCISION  2017 & 2018   SUBCLAVIAN STENT PLACEMENT  Jan 2014   Lt SCA   TONSILLECTOMY  1940's   TOTAL HIP ARTHROPLASTY Right 10/27/2016   Procedure: TOTAL HIP ARTHROPLASTY ANTERIOR APPROACH;  Surgeon: Samson Frederic, MD;  Location: [facility] OR;  Service: Orthopedics;  Laterality: Right;   UNILATERAL UPPER EXTREMEITY ANGIOGRAM N/A 08/04/2012   Procedure: UNILATERAL UPPER EXTREMEITY ANGIOGRAM;  Surgeon: [name], MD;  Location: [facility] CATH LAB;  Service: Cardiovascular;  Laterality: N/A;   UPPER EXTREMITY ANGIOGRAM Bilateral 06/28/2012   Procedure: UPPER EXTREMITY ANGIOGRAM;  Surgeon: [name], MD;  Location: [facility] CATH LAB;  Service: Cardiovascular;  Laterality: Bilateral;    FAMILY HISTORY: family history includes Coronary artery disease (age of onset: 98) in his father; Hypertension in his mother; Stroke (age of onset: 42) in his mother.  SOCIAL HISTORY:  reports that he has been smoking cigarettes and pipe. He has a 12.5 pack-year smoking history. He has never used smokeless tobacco. He reports current alcohol use. He reports that he does not use drugs.  ALLERGIES: Azithromycin and Cefuroxime  MEDICATIONS:  Current Outpatient Medications  Medication Sig Dispense Refill   albuterol (PROVENTIL  HFA;VENTOLIN HFA) 108 (90 Base) MCG/ACT inhaler Inhale 2 puffs into the lungs every 6 (six) hours as needed for wheezing or shortness of breath. 1 Inhaler 5   apixaban (ELIQUIS) 2.5 MG TABS tablet Take 1 tablet (2.5 mg total) by mouth 2 (two) times daily. 90 tablet 3   apixaban (ELIQUIS) 2.5 MG TABS tablet Take 1 tablet (2.5 mg total) by mouth 2 (two) times daily.     atorvastatin (LIPITOR) 40 MG tablet Take 1 tablet (40 mg total) by mouth daily. 30 tablet 11   Cyanocobalamin 500 MCG SUBL Take 500 mcg by mouth daily at 6 (six) AM.     furosemide (LASIX) 40 MG tablet Take 1 tablet (40 mg total) by mouth daily. 90 tablet 3   KRILL OIL PO Take 1 capsule by mouth daily.      latanoprost (XALATAN) 0.005 % ophthalmic solution Place 1 drop into both eyes at bedtime.     Magnesium Oxide 400 MG CAPS Take 1 capsule (400 mg total) by mouth in the morning and at bedtime. 180 capsule 3   metoprolol tartrate (LOPRESSOR) 25 MG tablet TAKE 1 TABLET BY MOUTH TWICE DAILY 180 tablet 3   propylthiouracil (PTU) 50 MG tablet Take 50 mg by mouth daily.     triamcinolone (NASACORT) 55 MCG/ACT AERO nasal inhaler Place 2 sprays into the nose daily as needed (ALLERGIES).     trolamine  salicylate (ASPERCREME) 10 % cream Apply 1 application topically as needed for muscle pain.     No current facility-administered medications for this encounter.    REVIEW OF SYSTEMS:  Notable for that above.   PHYSICAL EXAM:  height is 5\' 6"  (1.676 m) and weight is 170 lb 9.6 oz (77.4 kg). His oral temperature is 97.5 F (36.4 C) (abnormal). His blood pressure is 121/52 (abnormal) and his pulse is 63. His respiration is 18 and oxygen saturation is 100%.   General: Alert and oriented, in no acute distress  HEENT: Head is normocephalic. Extraocular movements are intact. Resection cavity at the left temple shows active skin healing with some clotting. No signs of infection.  Hard of hearing Neck: Neck is supple, no palpable cervical or  supraclavicular lymphadenopathy. Heart: Regular in rate and rhythm with no murmurs, rubs, or gallops. Chest: Clear to auscultation bilaterally, with no rhonchi, wheezes, or rales. Abdomen: Soft, nontender, nondistended, with no rigidity or guarding. Extremities: No cyanosis or edema. Lymphatics: see Neck Exam Skin: No concerning lesions. Musculoskeletal: symmetric strength and muscle tone throughout. In a WC Neurologic: Cranial nerves II through XII are grossly intact. No obvious focalities. Speech is fluent. Coordination is intact. Psychiatric: Judgment and insight are intact. Affect is appropriate.      ECOG = 2  0 - Asymptomatic (Fully active, able to carry on all predisease activities without restriction)  1 - Symptomatic but completely ambulatory (Restricted in physically strenuous activity but ambulatory and able to carry out work of a light or sedentary nature. For example, light housework, office work)  2 - Symptomatic, <50% in bed during the day (Ambulatory and capable of all self care but unable to carry out any work activities. Up and about more than 50% of waking hours)  3 - Symptomatic, >50% in bed, but not bedbound (Capable of only limited self-care, confined to bed or chair 50% or more of waking hours)  4 - Bedbound (Completely disabled. Cannot carry on any self-care. Totally confined to bed or chair)  5 - Death   Santiago Glad MM, Creech RH, Tormey DC, et al. [phone]). "Toxicity and response criteria of the [facility]". Am. Evlyn Clines. Oncol. 5 (6): 649-55   LABORATORY DATA:  Lab Results  Component Value Date   WBC 8.2 04/19/2023   HGB 11.9 (L) 04/19/2023   HCT 36.3 (L) 04/19/2023   MCV 88.5 04/19/2023   PLT 207 04/19/2023   CMP     Component Value Date/Time   NA 139 07/06/2023 0950   K 4.9 07/06/2023 0950   CL 104 07/06/2023 0950   CO2 23 07/06/2023 0950   GLUCOSE 163 (H) 07/06/2023 0950   GLUCOSE 132 (H) 04/19/2023 0537   BUN 45 (H)  07/06/2023 0950   CREATININE 1.55 (H) 07/06/2023 0950   CREATININE 0.86 09/21/2014 0949   CALCIUM 8.9 07/06/2023 0950   PROT 8.9 (H) 04/15/2023 1710   ALBUMIN 3.6 04/15/2023 1710   AST 27 04/15/2023 1710   ALT 19 04/15/2023 1710   ALKPHOS 92 04/15/2023 1710   BILITOT 1.3 (H) 04/15/2023 1710   EGFR 43 (L) 07/06/2023 0950   GFRNONAA 46 (L) 04/19/2023 0537         RADIOGRAPHY: VAS US RENAL ARTERY DUPLEX Result Date: 07/10/2023 ABDOMINAL VISCERAL Patient Name:  Shaun Mclaughlin  Date of Exam:   07/09/2023 Medical Rec #: [id number]        Accession #:    [id number] Date of Birth: August 06, 1934  Patient Gender: M Patient Age:   36 years Exam Location:  [facility] Procedure:      VAS US RENAL ARTERY DUPLEX Referring Phys: [id number] KATLYN D WEST -------------------------------------------------------------------------------- Indications: Renal artery stenosis. High Risk Factors: Hypertension, hyperlipidemia, Diabetes, coronary artery                    disease. Limitations: Very technically limited exam secondary to restricted mobility, overlying bowel gas, and patient body habitus. Comparison Study: 01/18/15 Renal artery duplex- LRA 60-99% diameter reduction. Performing Technologist: Gertie Fey MHA, RDMS, RVT, RDCS  Examination Guidelines: A complete evaluation includes B-mode imaging, spectral Doppler, color Doppler, and power Doppler as needed of all accessible portions of each vessel. Bilateral testing is considered an integral part of a complete examination. Limited examinations for reoccurring indications may be performed as noted.  Duplex Findings: +--------------------+--------+--------+------+------------------+ Mesenteric          PSV cm/sEDV cm/sPlaque     Comments      +--------------------+--------+--------+------+------------------+ Aorta Mid              68      12                            +--------------------+--------+--------+------+------------------+ Celiac Artery  Origin                      Unable to insonate +--------------------+--------+--------+------+------------------+ SMA Origin                                Unable to insonate +--------------------+--------+--------+------+------------------+    +------------------+--------+--------+------------------+ Right Renal ArteryPSV cm/sEDV cm/s     Comment       +------------------+--------+--------+------------------+ Origin                            Unable to insonate +------------------+--------+--------+------------------+ Proximal             64      12                      +------------------+--------+--------+------------------+ Mid                               Unable to insonate +------------------+--------+--------+------------------+ Distal               44      10                      +------------------+--------+--------+------------------+ +-----------------+--------+--------+------------------+ Left Renal ArteryPSV cm/sEDV cm/s     Comment       +-----------------+--------+--------+------------------+ Origin              89      9                       +-----------------+--------+--------+------------------+ Proximal                         Unable to insonate +-----------------+--------+--------+------------------+ Mid                              Unable to insonate +-----------------+--------+--------+------------------+ Distal  Unable to insonate +-----------------+--------+--------+------------------+ +------------+--------+--------+------------+-----------+--------+--------+----+ Right KidneyPSV cm/sEDV cm/sRI          Left KidneyPSV cm/sEDV cm/sRI   +------------+--------+--------+------------+-----------+--------+--------+----+ Upper Pole                  Unable to   Upper Pole 14      4       0.73                             insonate                                     +------------+--------+--------+------------+-----------+--------+--------+----+ Mid                         Unable to   Mid        20      4       0.78                             insonate                                    +------------+--------+--------+------------+-----------+--------+--------+----+ Lower Pole  20      5       0.75        Lower Pole 12      5       0.59 +------------+--------+--------+------------+-----------+--------+--------+----+ Hilar                                   Hilar      45      3       0.93 +------------+--------+--------+------------+-----------+--------+--------+----+ +------------------+---------+------------------+---------+ Right Kidney               Left Kidney                 +------------------+---------+------------------+---------+ RAR                        RAR                         +------------------+---------+------------------+---------+ RAR (manual)               RAR (manual)                +------------------+---------+------------------+---------+ Cortex            14/7 cm/sCortex            14/5 cm/s +------------------+---------+------------------+---------+ Cortex thickness           Corex thickness             +------------------+---------+------------------+---------+ Kidney length (cm)6.27     Kidney length (cm)8.85      +------------------+---------+------------------+---------+  Summary: Renal:  Right: Abnormal size for the right kidney. Renal artery inadequately        visualized for evaluation. Left:  Abnormal size for the left kidney. Renal artery inadequately        visualized for evaluation.  *See table(s) above for measurements and observations.  Diagnosing physician: Delrae Rend  Electronically signed by Delrae Rend on 07/10/2023  at 7:00:19 PM.    Final       IMPRESSION/PLAN: Squamous cell carcinoma left mid central temple; s/p Moh's surgery with positive margins on  06/29/23.   Patient presents today status post Moh's surgery to his left temple on 06/29/2023. Final pathology revealed squamous cell carcinoma with positive margins. Adjuvant radiation is recommended as part of his treatment, given the positive margins.   Today, we talked to the patient about the findings and work-up thus far.  We discussed the natural history of squamous cell skin cancer and general treatment, highlighting the role of radiotherapy in the management.  We discussed the available radiation techniques, and focused on the details of logistics and delivery.  We reviewed the anticipated acute and late sequelae associated with radiation in this setting. We talked about the possible skin irritation, fatigue, localized hair loss, and permanent tissue injury. The patient was encouraged to ask questions that I answered to the best of my ability. A patient consent form was discussed and signed.  We retained a copy for our records.  The patient would like to proceed with radiation and will be scheduled for CT simulation next week. We will plan to start treatment after the new year to allow for adequate healing. Anticipate 50 Gy in 20 fractions to the surgical bed. We look forward to participating in this patient's care.    On date of service, in total, I spent 60 minutes on this encounter. Patient was seen in person.   __________________________________________    Bryan Lemma, PA-C   Lonie Peak, MD    [facility]  Radiation Oncology Direct Dial: [phone]  Fax: [phone] .com   This document serves as a record of services personally performed by Lonie Peak, MD and Bryan Lemma, PA-C. It was created on her behalf by Neena Rhymes, a trained medical scribe. The creation of this record is based on the scribe's personal observations and the provider's statements to them. This document has been checked and approved by the attending provider.

## 2023-07-13 ENCOUNTER — Ambulatory Visit
Admission: RE | Admit: 2023-07-13 | Discharge: 2023-07-13 | Disposition: A | Discharge status: Home / Self Care | Payer: Medicare Other | Source: Ambulatory Visit | Attending: Radiation Oncology | Admitting: Radiation Oncology

## 2023-07-13 ENCOUNTER — Encounter: Payer: Self-pay | Admitting: Radiation Oncology

## 2023-07-13 VITALS — BP 121/52 | HR 63 | Temp 97.5°F | Resp 18 | Ht 66.0 in | Wt 170.6 lb

## 2023-07-13 DIAGNOSIS — I2510 Atherosclerotic heart disease of native coronary artery without angina pectoris: Secondary | ICD-10-CM | POA: Insufficient documentation

## 2023-07-13 DIAGNOSIS — I480 Paroxysmal atrial fibrillation: Secondary | ICD-10-CM | POA: Diagnosis not present

## 2023-07-13 DIAGNOSIS — I110 Hypertensive heart disease with heart failure: Secondary | ICD-10-CM | POA: Insufficient documentation

## 2023-07-13 DIAGNOSIS — Z8719 Personal history of other diseases of the digestive system: Secondary | ICD-10-CM | POA: Insufficient documentation

## 2023-07-13 DIAGNOSIS — F17210 Nicotine dependence, cigarettes, uncomplicated: Secondary | ICD-10-CM | POA: Diagnosis not present

## 2023-07-13 DIAGNOSIS — Z7901 Long term (current) use of anticoagulants: Secondary | ICD-10-CM | POA: Insufficient documentation

## 2023-07-13 DIAGNOSIS — Z79899 Other long term (current) drug therapy: Secondary | ICD-10-CM | POA: Diagnosis not present

## 2023-07-13 DIAGNOSIS — E1151 Type 2 diabetes mellitus with diabetic peripheral angiopathy without gangrene: Secondary | ICD-10-CM | POA: Insufficient documentation

## 2023-07-13 DIAGNOSIS — K219 Gastro-esophageal reflux disease without esophagitis: Secondary | ICD-10-CM | POA: Insufficient documentation

## 2023-07-13 DIAGNOSIS — Z8701 Personal history of pneumonia (recurrent): Secondary | ICD-10-CM | POA: Diagnosis not present

## 2023-07-13 DIAGNOSIS — E7800 Pure hypercholesterolemia, unspecified: Secondary | ICD-10-CM | POA: Diagnosis not present

## 2023-07-13 DIAGNOSIS — C44320 Squamous cell carcinoma of skin of unspecified parts of face: Secondary | ICD-10-CM | POA: Insufficient documentation

## 2023-07-13 DIAGNOSIS — E0590 Thyrotoxicosis, unspecified without thyrotoxic crisis or storm: Secondary | ICD-10-CM | POA: Insufficient documentation

## 2023-07-13 NOTE — Progress Notes (Signed)
Oncology Nurse Navigator Documentation   Met with patient before his initial consult with Dr. Basilio Cairo.  I introduced myself as his Navigator, explained my role as a member of the Care Team. I provided him with my direct contact information. He verbalized understanding of information provided. I encouraged them to call with questions/concerns moving forward.  Hedda Slade, RN, BSN, OCN Head & Neck Oncology Nurse Navigator [facility] [place] [phone]

## 2023-07-13 NOTE — Progress Notes (Signed)
Writer called transpotation to come pick patient up. States that they will be here soon.

## 2023-07-16 ENCOUNTER — Telehealth: Payer: Self-pay

## 2023-07-16 NOTE — Telephone Encounter (Signed)
Called patient advised of below they verbalized understanding He may not be able to keep appointment with with [place] on the 3rd Is there something we need to do?

## 2023-07-16 NOTE — Telephone Encounter (Signed)
-----   Message from Rip Harbour sent at 07/15/2023  9:13 PM EST ----- Please let Shaun Mclaughlin know that we were unable to adequately view his renal arteries for evaluation. Discussed with Dr. Royann Shivers will defer further evaluation at this time. Will plan to discuss more on follow up.

## 2023-07-19 NOTE — Telephone Encounter (Signed)
Would recommend keeping follow up, he can reschedule if needed to better suit his schedule. No recommended changes at this time.

## 2023-07-22 ENCOUNTER — Ambulatory Visit
Admission: RE | Admit: 2023-07-22 | Discharge: 2023-07-22 | Disposition: A | Discharge status: Home / Self Care | Payer: Medicare Other | Source: Ambulatory Visit | Attending: Radiation Oncology | Admitting: Radiation Oncology

## 2023-07-22 DIAGNOSIS — C44320 Squamous cell carcinoma of skin of unspecified parts of face: Secondary | ICD-10-CM | POA: Insufficient documentation

## 2023-07-27 ENCOUNTER — Telehealth: Payer: Self-pay | Admitting: Cardiovascular Disease

## 2023-07-27 NOTE — Telephone Encounter (Signed)
Called patient with no answer. Left vm to call back.

## 2023-07-27 NOTE — Telephone Encounter (Signed)
Patient stated he was returning call and noted he canceled appointment due to starting radiation treatment.

## 2023-07-30 ENCOUNTER — Ambulatory Visit: Payer: Medicare Other | Admitting: Cardiology

## 2023-07-30 NOTE — Telephone Encounter (Signed)
 Called patient with no answer. Left message to return call.

## 2023-08-02 ENCOUNTER — Ambulatory Visit
Admission: RE | Admit: 2023-08-02 | Discharge: 2023-08-02 | Disposition: A | Discharge status: Home / Self Care | Payer: Medicare Other | Source: Ambulatory Visit | Attending: Radiation Oncology | Admitting: Radiation Oncology

## 2023-08-02 DIAGNOSIS — C44320 Squamous cell carcinoma of skin of unspecified parts of face: Secondary | ICD-10-CM | POA: Insufficient documentation

## 2023-08-02 NOTE — Telephone Encounter (Signed)
 3rd attempt to call patient, no answer, left message requesting a call back Nursing will await for patient to return call

## 2023-08-04 ENCOUNTER — Ambulatory Visit: Admission: RE | Admit: 2023-08-04 | Payer: Medicare Other | Source: Ambulatory Visit | Admitting: Radiation Oncology

## 2023-08-04 ENCOUNTER — Other Ambulatory Visit: Payer: Self-pay

## 2023-08-04 DIAGNOSIS — C44320 Squamous cell carcinoma of skin of unspecified parts of face: Secondary | ICD-10-CM | POA: Diagnosis not present

## 2023-08-04 LAB — RAD ONC ARIA SESSION SUMMARY
Course Elapsed Days: 0
Plan Fractions Treated to Date: 1
Plan Prescribed Dose Per Fraction: 2.5 Gy
Plan Total Fractions Prescribed: 20
Plan Total Prescribed Dose: 50 Gy
Reference Point Dosage Given to Date: 2.5 Gy
Reference Point Session Dosage Given: 2.5 Gy
Session Number: 1

## 2023-08-05 ENCOUNTER — Ambulatory Visit
Admission: RE | Admit: 2023-08-05 | Discharge: 2023-08-05 | Disposition: A | Discharge status: Home / Self Care | Payer: Medicare Other | Source: Ambulatory Visit | Attending: Radiation Oncology | Admitting: Radiation Oncology

## 2023-08-05 ENCOUNTER — Other Ambulatory Visit: Payer: Self-pay

## 2023-08-05 DIAGNOSIS — C44320 Squamous cell carcinoma of skin of unspecified parts of face: Secondary | ICD-10-CM | POA: Diagnosis not present

## 2023-08-05 LAB — RAD ONC ARIA SESSION SUMMARY
Course Elapsed Days: 1
Plan Fractions Treated to Date: 2
Plan Prescribed Dose Per Fraction: 2.5 Gy
Plan Total Fractions Prescribed: 20
Plan Total Prescribed Dose: 50 Gy
Reference Point Dosage Given to Date: 5 Gy
Reference Point Session Dosage Given: 2.5 Gy
Session Number: 2

## 2023-08-06 ENCOUNTER — Other Ambulatory Visit: Payer: Self-pay

## 2023-08-06 ENCOUNTER — Ambulatory Visit
Admission: RE | Admit: 2023-08-06 | Discharge: 2023-08-06 | Disposition: A | Discharge status: Home / Self Care | Payer: Medicare Other | Source: Ambulatory Visit | Attending: Radiation Oncology | Admitting: Radiation Oncology

## 2023-08-06 DIAGNOSIS — C44320 Squamous cell carcinoma of skin of unspecified parts of face: Secondary | ICD-10-CM | POA: Diagnosis not present

## 2023-08-06 LAB — RAD ONC ARIA SESSION SUMMARY
Course Elapsed Days: 2
Plan Fractions Treated to Date: 3
Plan Prescribed Dose Per Fraction: 2.5 Gy
Plan Total Fractions Prescribed: 20
Plan Total Prescribed Dose: 50 Gy
Reference Point Dosage Given to Date: 7.5 Gy
Reference Point Session Dosage Given: 2.5 Gy
Session Number: 3

## 2023-08-09 ENCOUNTER — Ambulatory Visit
Admission: RE | Admit: 2023-08-09 | Discharge: 2023-08-09 | Disposition: A | Discharge status: Home / Self Care | Payer: Medicare Other | Source: Ambulatory Visit | Attending: Radiation Oncology | Admitting: Radiation Oncology

## 2023-08-09 ENCOUNTER — Ambulatory Visit: Payer: Medicare Other

## 2023-08-09 ENCOUNTER — Other Ambulatory Visit: Payer: Self-pay

## 2023-08-09 DIAGNOSIS — C44320 Squamous cell carcinoma of skin of unspecified parts of face: Secondary | ICD-10-CM | POA: Diagnosis not present

## 2023-08-09 LAB — RAD ONC ARIA SESSION SUMMARY
Course Elapsed Days: 5
Plan Fractions Treated to Date: 4
Plan Prescribed Dose Per Fraction: 2.5 Gy
Plan Total Fractions Prescribed: 20
Plan Total Prescribed Dose: 50 Gy
Reference Point Dosage Given to Date: 10 Gy
Reference Point Session Dosage Given: 2.5 Gy
Session Number: 4

## 2023-08-10 ENCOUNTER — Ambulatory Visit
Admission: RE | Admit: 2023-08-10 | Discharge: 2023-08-10 | Disposition: A | Discharge status: Home / Self Care | Payer: Medicare Other | Source: Ambulatory Visit | Attending: Radiation Oncology | Admitting: Radiation Oncology

## 2023-08-10 ENCOUNTER — Other Ambulatory Visit: Payer: Self-pay

## 2023-08-10 DIAGNOSIS — Z510 Encounter for antineoplastic radiation therapy: Secondary | ICD-10-CM | POA: Diagnosis not present

## 2023-08-10 DIAGNOSIS — C44320 Squamous cell carcinoma of skin of unspecified parts of face: Secondary | ICD-10-CM | POA: Diagnosis not present

## 2023-08-10 LAB — RAD ONC ARIA SESSION SUMMARY
Course Elapsed Days: 6
Plan Fractions Treated to Date: 5
Plan Prescribed Dose Per Fraction: 2.5 Gy
Plan Total Fractions Prescribed: 20
Plan Total Prescribed Dose: 50 Gy
Reference Point Dosage Given to Date: 12.5 Gy
Reference Point Session Dosage Given: 2.5 Gy
Session Number: 5

## 2023-08-11 ENCOUNTER — Ambulatory Visit
Admission: RE | Admit: 2023-08-11 | Discharge: 2023-08-11 | Disposition: A | Discharge status: Home / Self Care | Payer: Medicare Other | Source: Ambulatory Visit | Attending: Radiation Oncology | Admitting: Radiation Oncology

## 2023-08-11 ENCOUNTER — Other Ambulatory Visit: Payer: Self-pay

## 2023-08-11 DIAGNOSIS — C44320 Squamous cell carcinoma of skin of unspecified parts of face: Secondary | ICD-10-CM | POA: Diagnosis not present

## 2023-08-11 LAB — RAD ONC ARIA SESSION SUMMARY
Course Elapsed Days: 7
Plan Fractions Treated to Date: 6
Plan Prescribed Dose Per Fraction: 2.5 Gy
Plan Total Fractions Prescribed: 20
Plan Total Prescribed Dose: 50 Gy
Reference Point Dosage Given to Date: 15 Gy
Reference Point Session Dosage Given: 2.5 Gy
Session Number: 6

## 2023-08-12 ENCOUNTER — Ambulatory Visit
Admission: RE | Admit: 2023-08-12 | Discharge: 2023-08-12 | Disposition: A | Discharge status: Home / Self Care | Payer: Medicare Other | Source: Ambulatory Visit | Attending: Radiation Oncology | Admitting: Radiation Oncology

## 2023-08-12 ENCOUNTER — Other Ambulatory Visit: Payer: Self-pay

## 2023-08-12 DIAGNOSIS — E876 Hypokalemia: Secondary | ICD-10-CM | POA: Diagnosis not present

## 2023-08-12 DIAGNOSIS — J690 Pneumonitis due to inhalation of food and vomit: Secondary | ICD-10-CM | POA: Diagnosis not present

## 2023-08-12 LAB — RAD ONC ARIA SESSION SUMMARY
Course Elapsed Days: 8
Plan Fractions Treated to Date: 7
Plan Prescribed Dose Per Fraction: 2.5 Gy
Plan Total Fractions Prescribed: 20
Plan Total Prescribed Dose: 50 Gy
Reference Point Dosage Given to Date: 17.5 Gy
Reference Point Session Dosage Given: 2.5 Gy
Session Number: 7

## 2023-08-13 ENCOUNTER — Other Ambulatory Visit: Payer: Self-pay

## 2023-08-13 ENCOUNTER — Ambulatory Visit
Admission: RE | Admit: 2023-08-13 | Discharge: 2023-08-13 | Disposition: A | Discharge status: Home / Self Care | Payer: Medicare Other | Source: Ambulatory Visit | Attending: Radiation Oncology | Admitting: Radiation Oncology

## 2023-08-13 LAB — RAD ONC ARIA SESSION SUMMARY
Course Elapsed Days: 9
Plan Fractions Treated to Date: 8
Plan Prescribed Dose Per Fraction: 2.5 Gy
Plan Total Fractions Prescribed: 20
Plan Total Prescribed Dose: 50 Gy
Reference Point Dosage Given to Date: 20 Gy
Reference Point Session Dosage Given: 2.5 Gy
Session Number: 8

## 2023-08-15 ENCOUNTER — Emergency Department (HOSPITAL_COMMUNITY): Payer: Medicare Other

## 2023-08-15 ENCOUNTER — Encounter (HOSPITAL_COMMUNITY): Payer: Self-pay

## 2023-08-15 ENCOUNTER — Other Ambulatory Visit: Payer: Self-pay

## 2023-08-15 ENCOUNTER — Inpatient Hospital Stay (HOSPITAL_COMMUNITY)
Admission: EM | Admit: 2023-08-15 | Discharge: 2023-08-17 | DRG: 177 | Disposition: A | Discharge status: Skilled Nursing Facility | Payer: Medicare Other | Attending: Internal Medicine | Admitting: Internal Medicine

## 2023-08-15 DIAGNOSIS — J449 Chronic obstructive pulmonary disease, unspecified: Secondary | ICD-10-CM | POA: Diagnosis present

## 2023-08-15 DIAGNOSIS — I509 Heart failure, unspecified: Secondary | ICD-10-CM

## 2023-08-15 DIAGNOSIS — D649 Anemia, unspecified: Secondary | ICD-10-CM

## 2023-08-15 DIAGNOSIS — Z515 Encounter for palliative care: Secondary | ICD-10-CM | POA: Diagnosis not present

## 2023-08-15 DIAGNOSIS — I5043 Acute on chronic combined systolic (congestive) and diastolic (congestive) heart failure: Secondary | ICD-10-CM | POA: Diagnosis not present

## 2023-08-15 DIAGNOSIS — R58 Hemorrhage, not elsewhere classified: Secondary | ICD-10-CM | POA: Diagnosis not present

## 2023-08-15 DIAGNOSIS — D62 Acute posthemorrhagic anemia: Secondary | ICD-10-CM

## 2023-08-15 DIAGNOSIS — I2510 Atherosclerotic heart disease of native coronary artery without angina pectoris: Secondary | ICD-10-CM | POA: Diagnosis not present

## 2023-08-15 DIAGNOSIS — I10 Essential (primary) hypertension: Secondary | ICD-10-CM | POA: Diagnosis not present

## 2023-08-15 DIAGNOSIS — Z66 Do not resuscitate: Secondary | ICD-10-CM | POA: Diagnosis not present

## 2023-08-15 DIAGNOSIS — E876 Hypokalemia: Secondary | ICD-10-CM | POA: Diagnosis present

## 2023-08-15 DIAGNOSIS — D631 Anemia in chronic kidney disease: Secondary | ICD-10-CM | POA: Diagnosis present

## 2023-08-15 DIAGNOSIS — N179 Acute kidney failure, unspecified: Secondary | ICD-10-CM | POA: Diagnosis not present

## 2023-08-15 DIAGNOSIS — R0602 Shortness of breath: Secondary | ICD-10-CM | POA: Diagnosis not present

## 2023-08-15 DIAGNOSIS — C44329 Squamous cell carcinoma of skin of other parts of face: Secondary | ICD-10-CM | POA: Diagnosis present

## 2023-08-15 DIAGNOSIS — R64 Cachexia: Secondary | ICD-10-CM | POA: Diagnosis not present

## 2023-08-15 DIAGNOSIS — I672 Cerebral atherosclerosis: Secondary | ICD-10-CM | POA: Diagnosis not present

## 2023-08-15 DIAGNOSIS — C44320 Squamous cell carcinoma of skin of unspecified parts of face: Secondary | ICD-10-CM | POA: Diagnosis present

## 2023-08-15 DIAGNOSIS — Z711 Person with feared health complaint in whom no diagnosis is made: Secondary | ICD-10-CM | POA: Diagnosis not present

## 2023-08-15 DIAGNOSIS — N1832 Chronic kidney disease, stage 3b: Secondary | ICD-10-CM | POA: Diagnosis present

## 2023-08-15 DIAGNOSIS — Z85828 Personal history of other malignant neoplasm of skin: Secondary | ICD-10-CM

## 2023-08-15 DIAGNOSIS — I130 Hypertensive heart and chronic kidney disease with heart failure and stage 1 through stage 4 chronic kidney disease, or unspecified chronic kidney disease: Secondary | ICD-10-CM | POA: Diagnosis not present

## 2023-08-15 DIAGNOSIS — E1151 Type 2 diabetes mellitus with diabetic peripheral angiopathy without gangrene: Secondary | ICD-10-CM | POA: Diagnosis not present

## 2023-08-15 DIAGNOSIS — J690 Pneumonitis due to inhalation of food and vomit: Principal | ICD-10-CM | POA: Diagnosis present

## 2023-08-15 DIAGNOSIS — R195 Other fecal abnormalities: Secondary | ICD-10-CM | POA: Diagnosis not present

## 2023-08-15 DIAGNOSIS — G9341 Metabolic encephalopathy: Secondary | ICD-10-CM | POA: Diagnosis not present

## 2023-08-15 DIAGNOSIS — L89621 Pressure ulcer of left heel, stage 1: Secondary | ICD-10-CM | POA: Diagnosis not present

## 2023-08-15 DIAGNOSIS — K922 Gastrointestinal hemorrhage, unspecified: Secondary | ICD-10-CM

## 2023-08-15 DIAGNOSIS — Z743 Need for continuous supervision: Secondary | ICD-10-CM | POA: Diagnosis not present

## 2023-08-15 DIAGNOSIS — I701 Atherosclerosis of renal artery: Secondary | ICD-10-CM | POA: Diagnosis not present

## 2023-08-15 DIAGNOSIS — Z8249 Family history of ischemic heart disease and other diseases of the circulatory system: Secondary | ICD-10-CM

## 2023-08-15 DIAGNOSIS — Z961 Presence of intraocular lens: Secondary | ICD-10-CM | POA: Diagnosis present

## 2023-08-15 DIAGNOSIS — E1122 Type 2 diabetes mellitus with diabetic chronic kidney disease: Secondary | ICD-10-CM | POA: Diagnosis present

## 2023-08-15 DIAGNOSIS — L89151 Pressure ulcer of sacral region, stage 1: Secondary | ICD-10-CM | POA: Diagnosis not present

## 2023-08-15 DIAGNOSIS — U071 COVID-19: Principal | ICD-10-CM | POA: Diagnosis present

## 2023-08-15 DIAGNOSIS — I484 Atypical atrial flutter: Secondary | ICD-10-CM | POA: Diagnosis not present

## 2023-08-15 DIAGNOSIS — I110 Hypertensive heart disease with heart failure: Secondary | ICD-10-CM | POA: Diagnosis not present

## 2023-08-15 DIAGNOSIS — Z8701 Personal history of pneumonia (recurrent): Secondary | ICD-10-CM

## 2023-08-15 DIAGNOSIS — E0590 Thyrotoxicosis, unspecified without thyrotoxic crisis or storm: Secondary | ICD-10-CM | POA: Diagnosis present

## 2023-08-15 DIAGNOSIS — Z7189 Other specified counseling: Secondary | ICD-10-CM | POA: Diagnosis not present

## 2023-08-15 DIAGNOSIS — R410 Disorientation, unspecified: Secondary | ICD-10-CM | POA: Diagnosis not present

## 2023-08-15 DIAGNOSIS — I4819 Other persistent atrial fibrillation: Secondary | ICD-10-CM | POA: Diagnosis present

## 2023-08-15 DIAGNOSIS — R1312 Dysphagia, oropharyngeal phase: Secondary | ICD-10-CM | POA: Diagnosis not present

## 2023-08-15 DIAGNOSIS — J1282 Pneumonia due to coronavirus disease 2019: Secondary | ICD-10-CM | POA: Diagnosis not present

## 2023-08-15 DIAGNOSIS — R531 Weakness: Secondary | ICD-10-CM | POA: Diagnosis not present

## 2023-08-15 DIAGNOSIS — Z95828 Presence of other vascular implants and grafts: Secondary | ICD-10-CM

## 2023-08-15 DIAGNOSIS — Z6824 Body mass index (BMI) 24.0-24.9, adult: Secondary | ICD-10-CM

## 2023-08-15 DIAGNOSIS — R404 Transient alteration of awareness: Secondary | ICD-10-CM | POA: Diagnosis not present

## 2023-08-15 DIAGNOSIS — Z923 Personal history of irradiation: Secondary | ICD-10-CM

## 2023-08-15 DIAGNOSIS — Z789 Other specified health status: Secondary | ICD-10-CM | POA: Diagnosis not present

## 2023-08-15 DIAGNOSIS — Z955 Presence of coronary angioplasty implant and graft: Secondary | ICD-10-CM

## 2023-08-15 DIAGNOSIS — R918 Other nonspecific abnormal finding of lung field: Secondary | ICD-10-CM | POA: Diagnosis not present

## 2023-08-15 DIAGNOSIS — I5032 Chronic diastolic (congestive) heart failure: Secondary | ICD-10-CM | POA: Diagnosis present

## 2023-08-15 DIAGNOSIS — K921 Melena: Secondary | ICD-10-CM | POA: Diagnosis present

## 2023-08-15 DIAGNOSIS — K219 Gastro-esophageal reflux disease without esophagitis: Secondary | ICD-10-CM | POA: Diagnosis present

## 2023-08-15 DIAGNOSIS — I4892 Unspecified atrial flutter: Secondary | ICD-10-CM | POA: Diagnosis not present

## 2023-08-15 DIAGNOSIS — T45515A Adverse effect of anticoagulants, initial encounter: Secondary | ICD-10-CM | POA: Diagnosis present

## 2023-08-15 DIAGNOSIS — J189 Pneumonia, unspecified organism: Secondary | ICD-10-CM | POA: Diagnosis not present

## 2023-08-15 DIAGNOSIS — F17210 Nicotine dependence, cigarettes, uncomplicated: Secondary | ICD-10-CM | POA: Diagnosis present

## 2023-08-15 DIAGNOSIS — Z881 Allergy status to other antibiotic agents status: Secondary | ICD-10-CM

## 2023-08-15 DIAGNOSIS — N1831 Chronic kidney disease, stage 3a: Secondary | ICD-10-CM | POA: Diagnosis not present

## 2023-08-15 DIAGNOSIS — Z5982 Transportation insecurity: Secondary | ICD-10-CM

## 2023-08-15 DIAGNOSIS — Z7951 Long term (current) use of inhaled steroids: Secondary | ICD-10-CM

## 2023-08-15 DIAGNOSIS — E7800 Pure hypercholesterolemia, unspecified: Secondary | ICD-10-CM | POA: Diagnosis present

## 2023-08-15 DIAGNOSIS — H9190 Unspecified hearing loss, unspecified ear: Secondary | ICD-10-CM | POA: Diagnosis present

## 2023-08-15 DIAGNOSIS — J90 Pleural effusion, not elsewhere classified: Secondary | ICD-10-CM | POA: Diagnosis not present

## 2023-08-15 DIAGNOSIS — R509 Fever, unspecified: Secondary | ICD-10-CM | POA: Diagnosis not present

## 2023-08-15 DIAGNOSIS — D6832 Hemorrhagic disorder due to extrinsic circulating anticoagulants: Secondary | ICD-10-CM | POA: Diagnosis present

## 2023-08-15 DIAGNOSIS — Z7401 Bed confinement status: Secondary | ICD-10-CM | POA: Diagnosis not present

## 2023-08-15 DIAGNOSIS — Z96641 Presence of right artificial hip joint: Secondary | ICD-10-CM | POA: Diagnosis present

## 2023-08-15 LAB — CBC WITH DIFFERENTIAL/PLATELET
Abs Immature Granulocytes: 0 10*3/uL (ref 0.00–0.07)
Basophils Absolute: 0 10*3/uL (ref 0.0–0.1)
Basophils Relative: 0 %
Eosinophils Absolute: 0 10*3/uL (ref 0.0–0.5)
Eosinophils Relative: 0 %
HCT: 24.7 % — ABNORMAL LOW (ref 39.0–52.0)
Hemoglobin: 7.4 g/dL — ABNORMAL LOW (ref 13.0–17.0)
Lymphocytes Relative: 3 %
Lymphs Abs: 0.2 10*3/uL — ABNORMAL LOW (ref 0.7–4.0)
MCH: 26.1 pg (ref 26.0–34.0)
MCHC: 30 g/dL (ref 30.0–36.0)
MCV: 87.3 fL (ref 80.0–100.0)
Monocytes Absolute: 0.4 10*3/uL (ref 0.1–1.0)
Monocytes Relative: 5 %
Neutro Abs: 6.4 10*3/uL (ref 1.7–7.7)
Neutrophils Relative %: 92 %
Platelets: 247 10*3/uL (ref 150–400)
RBC: 2.83 MIL/uL — ABNORMAL LOW (ref 4.22–5.81)
RDW: 19.4 % — ABNORMAL HIGH (ref 11.5–15.5)
WBC: 7 10*3/uL (ref 4.0–10.5)
nRBC: 11 /100{WBCs} — ABNORMAL HIGH
nRBC: 3.1 % — ABNORMAL HIGH (ref 0.0–0.2)

## 2023-08-15 LAB — POC OCCULT BLOOD, ED: Fecal Occult Bld: POSITIVE — AB

## 2023-08-15 LAB — COMPREHENSIVE METABOLIC PANEL
ALT: 13 U/L (ref 0–44)
AST: 18 U/L (ref 15–41)
Albumin: 2 g/dL — ABNORMAL LOW (ref 3.5–5.0)
Alkaline Phosphatase: 63 U/L (ref 38–126)
Anion gap: 12 (ref 5–15)
BUN: 78 mg/dL — ABNORMAL HIGH (ref 8–23)
CO2: 27 mmol/L (ref 22–32)
Calcium: 9.7 mg/dL (ref 8.9–10.3)
Chloride: 99 mmol/L (ref 98–111)
Creatinine, Ser: 1.89 mg/dL — ABNORMAL HIGH (ref 0.61–1.24)
GFR, Estimated: 34 mL/min — ABNORMAL LOW (ref >60)
Glucose, Bld: 152 mg/dL — ABNORMAL HIGH (ref 70–99)
Potassium: 3.1 mmol/L — ABNORMAL LOW (ref 3.5–5.1)
Sodium: 138 mmol/L (ref 135–145)
Total Bilirubin: 1.3 mg/dL — ABNORMAL HIGH (ref 0.0–1.2)
Total Protein: 5.5 g/dL — ABNORMAL LOW (ref 6.5–8.1)

## 2023-08-15 LAB — BRAIN NATRIURETIC PEPTIDE: B Natriuretic Peptide: 1284.9 pg/mL — ABNORMAL HIGH (ref 0.0–100.0)

## 2023-08-15 LAB — RESP PANEL BY RT-PCR (RSV, FLU A&B, COVID)  RVPGX2
Influenza A by PCR: NEGATIVE
Influenza B by PCR: NEGATIVE
Resp Syncytial Virus by PCR: NEGATIVE
SARS Coronavirus 2 by RT PCR: POSITIVE — AB

## 2023-08-15 LAB — CBG MONITORING, ED: Glucose-Capillary: 140 mg/dL — ABNORMAL HIGH (ref 70–99)

## 2023-08-15 LAB — TROPONIN I (HIGH SENSITIVITY)
Troponin I (High Sensitivity): 60 ng/L — ABNORMAL HIGH (ref <18)
Troponin I (High Sensitivity): 56 ng/L — ABNORMAL HIGH (ref <18)

## 2023-08-15 LAB — PROCALCITONIN: Procalcitonin: 0.63 ng/mL

## 2023-08-15 LAB — MAGNESIUM: Magnesium: 1.5 mg/dL — ABNORMAL LOW (ref 1.7–2.4)

## 2023-08-15 MED ORDER — PANTOPRAZOLE SODIUM 40 MG IV SOLR
40.0000 mg | Freq: Four times a day (QID) | INTRAVENOUS | Status: DC
  Administered 2023-08-16 – 2023-08-17 (×5): 40 mg via INTRAVENOUS
  Filled 2023-08-15 (×5): qty 10

## 2023-08-15 MED ORDER — PROPYLTHIOURACIL 50 MG PO TABS
50.0000 mg | ORAL_TABLET | Freq: Every day | ORAL | Status: DC
  Filled 2023-08-15 (×2): qty 1

## 2023-08-15 MED ORDER — ACETAMINOPHEN 325 MG PO TABS: 650.0000 mg | ORAL_TABLET | Freq: Four times a day (QID) | ORAL | Status: DC | PRN

## 2023-08-15 MED ORDER — METOPROLOL TARTRATE 25 MG PO TABS
25.0000 mg | ORAL_TABLET | Freq: Two times a day (BID) | ORAL | Status: DC
  Administered 2023-08-15 – 2023-08-16 (×3): 25 mg via ORAL
  Filled 2023-08-15 (×3): qty 1

## 2023-08-15 MED ORDER — POLYETHYLENE GLYCOL 3350 17 G PO PACK: 17.0000 g | PACK | Freq: Every day | ORAL | Status: DC | PRN

## 2023-08-15 MED ORDER — ALBUTEROL SULFATE (2.5 MG/3ML) 0.083% IN NEBU: 2.5000 mg | INHALATION_SOLUTION | Freq: Four times a day (QID) | RESPIRATORY_TRACT | Status: DC

## 2023-08-15 MED ORDER — SODIUM CHLORIDE 0.9% FLUSH
3.0000 mL | INTRAVENOUS | Status: DC | PRN
  Administered 2023-08-16: 3 mL via INTRAVENOUS

## 2023-08-15 MED ORDER — SODIUM CHLORIDE 0.9 % IV SOLN
1.0000 g | Freq: Once | INTRAVENOUS | Status: AC
  Administered 2023-08-15: 1 g via INTRAVENOUS
  Filled 2023-08-15: qty 10

## 2023-08-15 MED ORDER — MAGNESIUM SULFATE 4 GM/100ML IV SOLN
4.0000 g | Freq: Once | INTRAVENOUS | Status: AC
  Administered 2023-08-15: 4 g via INTRAVENOUS
  Filled 2023-08-15: qty 100

## 2023-08-15 MED ORDER — PANTOPRAZOLE SODIUM 40 MG IV SOLR
40.0000 mg | INTRAVENOUS | Status: AC
  Filled 2023-08-15: qty 10

## 2023-08-15 MED ORDER — PANTOPRAZOLE SODIUM 40 MG IV SOLR
40.0000 mg | Freq: Once | INTRAVENOUS | Status: AC
  Administered 2023-08-15: 40 mg via INTRAVENOUS
  Filled 2023-08-15: qty 10

## 2023-08-15 MED ORDER — PANTOPRAZOLE SODIUM 40 MG IV SOLR: 40.0000 mg | Freq: Two times a day (BID) | INTRAVENOUS | Status: DC

## 2023-08-15 MED ORDER — ACETAMINOPHEN 650 MG RE SUPP: 650.0000 mg | Freq: Four times a day (QID) | RECTAL | Status: DC | PRN

## 2023-08-15 MED ORDER — ONDANSETRON HCL 4 MG/2ML IJ SOLN: 4.0000 mg | Freq: Four times a day (QID) | INTRAMUSCULAR | Status: DC | PRN

## 2023-08-15 MED ORDER — IPRATROPIUM BROMIDE 0.02 % IN SOLN
0.5000 mg | Freq: Three times a day (TID) | RESPIRATORY_TRACT | Status: DC
  Administered 2023-08-16 (×2): 0.5 mg via RESPIRATORY_TRACT
  Filled 2023-08-15 (×2): qty 2.5

## 2023-08-15 MED ORDER — IPRATROPIUM BROMIDE 0.02 % IN SOLN: 0.5000 mg | Freq: Four times a day (QID) | RESPIRATORY_TRACT | Status: DC

## 2023-08-15 MED ORDER — SODIUM CHLORIDE 0.9 % IV SOLN
3.0000 g | Freq: Two times a day (BID) | INTRAVENOUS | Status: DC
Start: 2023-08-15 — End: 2023-08-17
  Administered 2023-08-15 – 2023-08-16 (×3): 3 g via INTRAVENOUS
  Filled 2023-08-15 (×3): qty 8

## 2023-08-15 MED ORDER — FUROSEMIDE 10 MG/ML IJ SOLN
40.0000 mg | Freq: Once | INTRAMUSCULAR | Status: AC
  Administered 2023-08-15: 40 mg via INTRAVENOUS
  Filled 2023-08-15: qty 4

## 2023-08-15 MED ORDER — POTASSIUM CHLORIDE CRYS ER 20 MEQ PO TBCR
40.0000 meq | EXTENDED_RELEASE_TABLET | Freq: Once | ORAL | Status: AC
  Administered 2023-08-15: 40 meq via ORAL
  Filled 2023-08-15: qty 2

## 2023-08-15 MED ORDER — PANTOPRAZOLE 80MG IVPB - SIMPLE MED
80.0000 mg | Freq: Once | INTRAVENOUS | Status: DC
  Filled 2023-08-15: qty 100

## 2023-08-15 MED ORDER — ARFORMOTEROL TARTRATE 15 MCG/2ML IN NEBU
15.0000 ug | INHALATION_SOLUTION | Freq: Two times a day (BID) | RESPIRATORY_TRACT | Status: DC
  Administered 2023-08-15 – 2023-08-16 (×3): 15 ug via RESPIRATORY_TRACT
  Filled 2023-08-15 (×4): qty 2

## 2023-08-15 MED ORDER — SODIUM CHLORIDE 0.9% FLUSH
3.0000 mL | Freq: Two times a day (BID) | INTRAVENOUS | Status: DC
  Administered 2023-08-15 – 2023-08-16 (×3): 3 mL via INTRAVENOUS

## 2023-08-15 MED ORDER — BUDESONIDE 0.25 MG/2ML IN SUSP
0.2500 mg | Freq: Two times a day (BID) | RESPIRATORY_TRACT | Status: DC
  Administered 2023-08-15 – 2023-08-16 (×3): 0.25 mg via RESPIRATORY_TRACT
  Filled 2023-08-15 (×4): qty 2

## 2023-08-15 MED ORDER — PANTOPRAZOLE SODIUM 40 MG IV SOLR
80.0000 mg | Freq: Once | INTRAVENOUS | Status: AC
  Administered 2023-08-15: 80 mg via INTRAVENOUS

## 2023-08-15 MED ORDER — ONDANSETRON HCL 4 MG PO TABS
4.0000 mg | ORAL_TABLET | Freq: Four times a day (QID) | ORAL | Status: DC | PRN
Start: 2023-08-15 — End: 2023-08-17

## 2023-08-15 MED ORDER — SODIUM CHLORIDE 0.9% IV SOLUTION: Freq: Once | INTRAVENOUS | Status: AC

## 2023-08-15 MED ORDER — ALBUTEROL SULFATE (2.5 MG/3ML) 0.083% IN NEBU
2.5000 mg | INHALATION_SOLUTION | Freq: Three times a day (TID) | RESPIRATORY_TRACT | Status: DC
  Administered 2023-08-16 (×2): 2.5 mg via RESPIRATORY_TRACT
  Filled 2023-08-15 (×2): qty 3

## 2023-08-15 MED ORDER — METHYLPREDNISOLONE SODIUM SUCC 40 MG IJ SOLR
40.0000 mg | Freq: Two times a day (BID) | INTRAMUSCULAR | Status: DC
  Administered 2023-08-15 – 2023-08-16 (×2): 40 mg via INTRAVENOUS
  Filled 2023-08-15 (×2): qty 1

## 2023-08-15 MED ORDER — PANTOPRAZOLE INFUSION (NEW) - SIMPLE MED
8.0000 mg/h | INTRAVENOUS | Status: DC
  Filled 2023-08-15: qty 100

## 2023-08-15 MED ORDER — SODIUM CHLORIDE 0.9 % IV SOLN
250.0000 mL | INTRAVENOUS | Status: AC | PRN
Start: 2023-08-15 — End: 2023-08-16

## 2023-08-15 MED ORDER — ATORVASTATIN CALCIUM 40 MG PO TABS
40.0000 mg | ORAL_TABLET | Freq: Every day | ORAL | Status: DC
  Administered 2023-08-15 – 2023-08-16 (×2): 40 mg via ORAL
  Filled 2023-08-15 (×2): qty 1

## 2023-08-15 MED ORDER — DOXYCYCLINE HYCLATE 100 MG PO TABS
100.0000 mg | ORAL_TABLET | Freq: Once | ORAL | Status: AC
  Administered 2023-08-15: 100 mg via ORAL
  Filled 2023-08-15: qty 1

## 2023-08-15 MED ORDER — GUAIFENESIN ER 600 MG PO TB12
600.0000 mg | ORAL_TABLET | Freq: Two times a day (BID) | ORAL | Status: DC
  Administered 2023-08-16 (×2): 600 mg via ORAL
  Filled 2023-08-15 (×3): qty 1

## 2023-08-15 MED ORDER — DOXYCYCLINE HYCLATE 100 MG PO TABS
100.0000 mg | ORAL_TABLET | Freq: Two times a day (BID) | ORAL | Status: DC
  Administered 2023-08-15 – 2023-08-16 (×3): 100 mg via ORAL
  Filled 2023-08-15 (×3): qty 1

## 2023-08-15 NOTE — H&P (Addendum)
History and Physical    Patient: Shaun Mclaughlin DOB: [birth year] DOA: 08/15/2023 DOS: the patient was seen and examined on 08/15/2023 PCP: Tally Joe, MD  Patient coming from: SNF  Chief Complaint:  Chief Complaint  Patient presents with   Shortness of Breath   Altered Mental Status   HPI: Shaun Mclaughlin is a 89 y.o. male with medical history significant of HTN, HLD, DM-2, CAD, persistent atrial fibrillation chronic HFpEF, CAD squamous cell carcinoma of the skin (face)-currently getting radiation who presented with altered mental status and shortness of breath.  Please note-no family at bedside-patient is extremely hard of hearing-most of this history is obtained after talking with the patient/spouse and daughter-in-law over the phone.  Patient apparently has been residing at [facility] for the past 1 year-for the past 1-2 weeks he has been lethargic-and somewhat confused.  Wife describes an episode where he was in his underwear and wanted to go down on the elevator to another floor.  She acknowledges that he has been answering questions inappropriately-and has been confused.  For the past 1 week or so-he has had dark/black-colored stools.  He he has developed shortness of breath-mostly on exertion-and has started to have a hacking cough which is productive with yellowish phlegm.  Family nor the patient has noticed any fever.  Per spouse-patient tested negative for COVID last week.  These symptoms gradually worsened-he was brought-he was brought to the hospital-where he was found to have a hemoglobin of 7.4 with FOBT positive stools and significant infiltrates on his chest x-ray (right> left)  The hospitalist service was then asked to admit this patient for further evaluation and treatment.  No fever No headache + SOB + Productive cough No chest pain No hemoptysis No nausea, vomiting or diarrhea Denies any abdominal pain No hematuria No hematochezia +  Melena   Review of Systems: As mentioned in the history of present illness. All other systems reviewed and are negative. Past Medical History:  Diagnosis Date   Acute on chronic combined systolic and diastolic congestive heart failure, NYHA class 2 (HCC) 06/29/2014   Arthritis    "maybe a little bit in some joints" (08/04/2012)   Atherosclerotic renal artery stenosis, unilateral (HCC)    lleft renal artery stenosis by duplex ultrasound   CAD (coronary artery disease),Hx RCA stenting-patent 06/2012  08/05/2012   Chronic anticoagulation, coumadin for PAF 08/05/2012   Chronic bronchitis (HCC)    "about q yr" (08/04/2012)   DM (diabetes mellitus) (HCC) 08/05/2012   External bleeding hemorrhoids ~ 2003; 2008; 2013   "removed polyps and no bleeding since" (08/04/2012)   GERD (gastroesophageal reflux disease)    Hip fracture (HCC) 10/2016   RIGHT FEMORAL NECK    HTN (hypertension) 08/05/2012   Hypercholesteremia    Hyperlipidemia 08/05/2012   Hyperthyroidism    Kidney stone 1980's   PAF (paroxysmal atrial fibrillation), maintaining SR 08/05/2012   Pneumonia ~ 2008   PVD (peripheral vascular disease) with claudication, Lt Upper ext. pain, and known 80-90%distal Lt. subclavian artery stenosis  08/05/2012   S/P angioplasty with stent to Lt. subclavian artery -Nitrinol stent 08/04/12 08/05/2012   Type II diabetes mellitus (HCC)    "take RX; I'm prediabetic; don't have to  check my CBG qd; keep my weight down" (08/04/2012)   Past Surgical History:  Procedure Laterality Date   CARDIAC CATHETERIZATION  2008 and Dec 2013   patent coronaries   CARDIOVERSION N/A 07/13/2014   Procedure: CARDIOVERSION;  Surgeon: Rachelle Hora Croitoru,  MD;  Location: [facility];  Service: Cardiovascular;  Laterality: N/A;   CARDIOVERSION N/A 09/28/2014   Procedure: CARDIOVERSION;  Surgeon: Lars Masson, MD;  Location: [facility] ENDOSCOPY;  Service: Cardiovascular;  Laterality: N/A;   CATARACT EXTRACTION W/ INTRAOCULAR LENS IMPLANT      "right eye" (08/04/2012)   CORONARY ANGIOPLASTY WITH STENT PLACEMENT  2003   patent 12/13   GLAUCOMA SURGERY  2000's   "right eye; had laser procedure  to lower the pressure and prevent glaucoma" (08/04/2012)   HERNIA REPAIR  2000's   "umbilical" (08/04/2012)   INGUINAL HERNIA REPAIR  2000's   "right" (08/04/2012)   KIDNEY STONE SURGERY  1980's   LEFT HEART CATHETERIZATION WITH CORONARY ANGIOGRAM N/A 06/28/2012   Procedure: LEFT HEART CATHETERIZATION WITH CORONARY ANGIOGRAM;  Surgeon: [name], MD;  Location: [facility] CATH LAB;  Service: Cardiovascular;  Laterality: N/A;   LITHOTRIPSY  1980's   "imploded in my kidney; ended up having to be cut open in my back" (08/04/2012)   SKIN CANCER EXCISION  2017 & 2018   SUBCLAVIAN STENT PLACEMENT  Jan 2014   Lt SCA   TONSILLECTOMY  1940's   TOTAL HIP ARTHROPLASTY Right 10/27/2016   Procedure: TOTAL HIP ARTHROPLASTY ANTERIOR APPROACH;  Surgeon: Samson Frederic, MD;  Location: [facility] OR;  Service: Orthopedics;  Laterality: Right;   UNILATERAL UPPER EXTREMEITY ANGIOGRAM N/A 08/04/2012   Procedure: UNILATERAL UPPER EXTREMEITY ANGIOGRAM;  Surgeon: [name], MD;  Location: [facility] CATH LAB;  Service: Cardiovascular;  Laterality: N/A;   UPPER EXTREMITY ANGIOGRAM Bilateral 06/28/2012   Procedure: UPPER EXTREMITY ANGIOGRAM;  Surgeon: [name], MD;  Location: [facility] CATH LAB;  Service: Cardiovascular;  Laterality: Bilateral;   Social History:  reports that he has been smoking cigarettes and pipe. He has a 12.5 pack-year smoking history. He has never used smokeless tobacco. He reports current alcohol use. He reports that he does not use drugs.  Allergies  Allergen Reactions   Azithromycin Dermatitis   Cefuroxime Other (See Comments)    Abdominal pain    Family History  Problem Relation Age of Onset   Stroke Mother 39   Hypertension Mother    Coronary artery disease Father 84    Prior to Admission medications   Medication Sig Start Date End Date Taking?  Authorizing Provider  albuterol (PROVENTIL HFA;VENTOLIN HFA) 108 (90 Base) MCG/ACT inhaler Inhale 2 puffs into the lungs every 6 (six) hours as needed for wheezing or shortness of breath. 02/19/17   Croitoru, Mihai, MD  apixaban (ELIQUIS) 2.5 MG TABS tablet Take 1 tablet (2.5 mg total) by mouth 2 (two) times daily. 07/08/23   Reather Littler D, NP  apixaban (ELIQUIS) 2.5 MG TABS tablet Take 1 tablet (2.5 mg total) by mouth 2 (two) times daily. 07/08/23   Reather Littler D, NP  atorvastatin (LIPITOR) 40 MG tablet Take 1 tablet (40 mg total) by mouth daily. 03/20/16   Croitoru, Mihai, MD  Cyanocobalamin 500 MCG SUBL Take 500 mcg by mouth daily at 6 (six) AM. 09/08/22   [provider]  furosemide (LASIX) 40 MG tablet Take 1 tablet (40 mg total) by mouth daily. 07/08/23 10/06/23  Reather Littler D, NP  KRILL OIL PO Take 1 capsule by mouth daily.     [provider]  latanoprost (XALATAN) 0.005 % ophthalmic solution Place 1 drop into both eyes at bedtime. 09/05/19   [provider]  Magnesium Oxide 400 MG CAPS Take 1 capsule (400 mg total) by mouth  in the morning and at bedtime. 07/07/23   Croitoru, Mihai, MD  metoprolol tartrate (LOPRESSOR) 25 MG tablet TAKE 1 TABLET BY MOUTH TWICE DAILY 09/03/22   Croitoru, Mihai, MD  propylthiouracil (PTU) 50 MG tablet Take 50 mg by mouth daily.    [provider]  triamcinolone (NASACORT) 55 MCG/ACT AERO nasal inhaler Place 2 sprays into the nose daily as needed (ALLERGIES).    [provider]  trolamine salicylate (ASPERCREME) 10 % cream Apply 1 application topically as needed for muscle pain.    [provider]    Physical Exam: Vitals:   08/15/23 1700 08/15/23 1730 08/15/23 1739 08/15/23 1815  BP: (!) 120/57 (!) 122/52  (!) 127/59  Pulse: (!) 119  65   Resp: (!) 26 (!) 28 (!) 42 (!) 39  Temp:      TempSrc:      SpO2: 93%  97% 100%  Weight:      Height:        Gen Exam: Very frail appearing-accumulating some  secretions at time-but not in any distress HEENT:atraumatic, normocephalic Chest: Significant transmitted upper airway sounds-scattered rhonchi-significant rales mostly in the right lung. CVS:S1S2 regular Abdomen:soft non tender, non distended Extremities:+ edema Neurology: Non focal-but with generalized weakness. Skin: no rash  Data Reviewed:     Latest Ref Rng & Units 08/15/2023    2:08 PM 04/19/2023    5:37 AM 04/18/2023   12:35 PM  CBC  WBC 4.0 - 10.5 K/uL 7.0  8.2  9.0   Hemoglobin 13.0 - 17.0 g/dL 7.4  13.2  44.0   Hematocrit 39.0 - 52.0 % 24.7  36.3  37.2   Platelets 150 - 400 K/uL 247  207  230         Latest Ref Rng & Units 08/15/2023    2:08 PM 07/06/2023    9:50 AM 06/22/2023    3:54 PM  BMP  Glucose 70 - 99 mg/dL 102  725  366   BUN 8 - 23 mg/dL 78  45  33   Creatinine 0.61 - 1.24 mg/dL 4.40  3.47  4.25   BUN/Creat Ratio 10 - 24  29  16    Sodium 135 - 145 mmol/L 138  139  138   Potassium 3.5 - 5.1 mmol/L 3.1  4.9  5.8   Chloride 98 - 111 mmol/L 99  104  96   CO2 22 - 32 mmol/L 27  23  23    Calcium 8.9 - 10.3 mg/dL 9.7  8.9  8.9     12 lead EKG: Atrial fibrillation  Chest x-ray (personally reviewed) mostly right-sided infiltrate-perhaps a pleural effusion.  Assessment and Plan: Upper GI bleeding with acute blood loss anemia Family/patient acknowledges black-colored stools-FOBT positive-significant drop in hemoglobin-BUN disproportionately elevated than his creatinine. Agree with 1 unit of PRBC transfusion Starting PPI infusion Holding Eliquis EDP has already consulted GI-will be seen tomorrow Follow CBC closely  Multifocal pneumonia-right> left-suspect aspiration pneumonia vs COVID-19 PNA Suspect combination of anemia/COVID-19 infection-has caused him to have weakness and perhaps aspiration pneumonia.  He has significant infiltrates-mostly in his right lung. Suspect this is mostly aspiration-unclear if he has some COVID-19 PNA as well.  He is currently on  room air-but his pulse ox is unreliable-as fluctuates quite a bit-due to poor wave form. Will get procalcitonin/CRP-but I will keep him n.p.o. till SLP evaluation can be done tomorrow morning In the interim-will start Unasyn/doxycycline-and will empirically keep him on steroids-in case he has COVID-19  pneumonia-given how tenuous his overall situation is Pulmonary toileting with incentive spirometry/flutter valve/chest PT-will add mucolytic's  As needed nasotracheal suctioning by respiratory therapist. Discussed with spouse-and then with daughter-in-law over the phone-he is a confirmed DNR.  Although he appears relatively stable-he is very tenuous-and at risk for further aspiration episodes.  Family aware that if he deteriorates any further-he is already on maximal medical treatment-and that next step would be transitioning to hospice care.  AKI on CKD stage IIIb AKI-likely hemodynamically mediated due to GI bleeding/pneumonia Watch closely-avoid nephrotoxic agents Frequent bladder scans Follow electrolytes-if worsens-we can contemplate further workup.  Acute metabolic encephalopathy Likely due to combination pneumonia/mild AKI He is incredibly hard of hearing but is a poor historian-and is somewhat confused per history obtained from patient's wife.  treat underlying etiologies-maintain delirium precautions and follow closely.  Acute on chronic HFpEF Does have some mild leg edema-his BNP is significantly elevated but he is  lung exam/chest x-ray is mostly consistent with pneumonia. He has already received 1 dose of IV Lasix-here in the emergency room-will hold further Lasix today-and reassess tomorrow and redose. Daily weights/intake/output/electrolytes.  Persistent atrial fibrillation Rate controlled-continue metoprolol with holding parameters Eliquis on hold due to GI bleeding.  History of CAD-s/p PCI to RCA 2003 No anginal symptoms-exertional dyspnea is likely from  PNA/anemia Beta-blocker/statin  PAD-history of left subclavian artery stenosis-s/p prior stenting Per prior notes-blood pressure should be obtained in the right arm.  Hypokalemia Has already been replaced-recheck tomorrow morning  Hypomagnesemia Will replete and recheck  COPD Not in exacerbation Will add scheduled bronchodilators-to help with pulmonary toileting On steroids for possible COVID-19 pneumonia.  Hyperthyroidism PTU-follow CBC/LFTs TSH/free T4 with a.m. labs  History of squamous cell carcinoma of the skin-involving his left temple Apparently is getting ongoing radiation therapy Supportive care in the interim.  Palliative care DNR/DNI confirmed with spouse and daughter-in-law (who is a Engineer, civil (consulting)) Family well aware that if he deteriorates any further-we may need to transition to hospice care-as really no further role for any further escalation in care. Will get palliative care consultation    Advance Care Planning:   Code Status: Limited: Do not attempt resuscitation (DNR) -DNR-LIMITED -Do Not Intubate/DNI    Consults: Palliative care  Family Communication:  Daughter-in-law-Ashley Onken is a [phone]-updated over the phone Spouse-Peggy [phone]-updated over the phone.  Severity of Illness: The appropriate patient status for this patient is INPATIENT. Inpatient status is judged to be reasonable and necessary in order to provide the required intensity of service to ensure the patient's safety. The patient's presenting symptoms, physical exam findings, and initial radiographic and laboratory data in the context of their chronic comorbidities is felt to place them at high risk for further clinical deterioration. Furthermore, it is not anticipated that the patient will be medically stable for discharge from the hospital within 2 midnights of admission.   * I certify that at the point of admission it is my clinical judgment that the patient will require  inpatient hospital care spanning beyond 2 midnights from the point of admission due to high intensity of service, high risk for further deterioration and high frequency of surveillance required.*  Author: Jeoffrey Massed, MD 08/15/2023 7:02 PM  For on call review [email or web address].

## 2023-08-15 NOTE — Progress Notes (Signed)
Pharmacy Antibiotic Note  Shaun Mclaughlin is a 89 y.o. male for which pharmacy has been consulted for ampicillin-sulbactam dosing for pneumonia.  Estimated Creatinine Clearance: 26.2 mL/min (A) (by C-G formula based on SCr of 1.89 mg/dL (H)).  Plan: Unasyn 3g q12h Monitor WBC, fever, renal function, cultures De-escalate when able  Height: 5\' 7"  (170.2 cm) Weight: 70.8 kg (156 lb) IBW/kg (Calculated) : 66.1  Temp (24hrs), Avg:97.5 F (36.4 C), Min:97.5 F (36.4 C), Max:97.5 F (36.4 C)  Recent Labs  Lab 08/15/23 1408  WBC 7.0  CREATININE 1.89*    Estimated Creatinine Clearance: 26.2 mL/min (A) (by C-G formula based on SCr of 1.89 mg/dL (H)).    Allergies  Allergen Reactions   Azithromycin Dermatitis   Cefuroxime Other (See Comments)    Abdominal pain    Microbiology results: Pending  Thank you for allowing pharmacy to be a part of this patient's care.  Delmar Landau, PharmD, BCPS 08/15/2023 7:00 PM ED Clinical Pharmacist -  [phone]

## 2023-08-15 NOTE — ED Notes (Signed)
RT notified of need for NT suctioning.

## 2023-08-15 NOTE — ED Notes (Signed)
Granddaughter of pt is at bedside and requesting to be added to his contacts.

## 2023-08-15 NOTE — ED Notes (Signed)
Pts daughter Gurvir Thane): [phone].

## 2023-08-15 NOTE — ED Triage Notes (Signed)
Pt BIB EMS for AMS and SHOB that started today. Tachypneic, pale, weak, and lethargic. Pt has skin cancer and is being treated with radiation and family states pt progressive weak and altered since radiation. Axox3.

## 2023-08-15 NOTE — Progress Notes (Signed)
RT NTS'd pt removing small to moderate amount of sputum, white/clear/frothy.  Pt had weak cough and continues to rattle after deep suctioning.

## 2023-08-15 NOTE — ED Provider Notes (Signed)
Received signout from previous provider, please see his note for complete H&P.  In short this is an 89 year old male who lives at a nursing facility, brought here accompanied by family member with concerns of increased shortness of breath and appears to be more altered.  Symptoms seems to be ongoing for at least the past several weeks.  He does have history of squamous cell carcinoma and is currently receiving treatment.  Family felt symptoms seem to progress after he received his treatment.  Family visited him at his facility and was noted that he was more lethargic and pale and tachypneic than his normal baseline.  Although PE is possibly on the differential, patient is currently on Eliquis with patient is less likely.  Patient does have elevated BNP as well as elevated troponin.  Suspect component of CHF exacerbation causing his shortness of breath.  He does have some AKI as well therefore he will need to be admitted for gentle hydration and close monitoring.  Elevated troponin in the setting of no chest pain, likely demand ischemia.  -Labs ordered, independently viewed and interpreted by me.  Labs remarkable for fecal occult positive, will consult GI. Trop 56, likely demand ischemia but value improves from previous.  Covid positive. Pt is outside the window for Paxlovid.  Elevated BNP of 1284 and since patient has some trouble breathing. -The patient was maintained on a cardiac monitor.  I personally viewed and interpreted the cardiac monitored which showed an underlying rhythm of: NSR -Imaging independently viewed and interpreted by me and I agree with radiologist's interpretation.  Result remarkable for CXR showing focal infiltrates to R lung and left base opacification consistent with multifocal pneumonia -This patient presents to the ED for concern of AMS, this involves an extensive number of treatment options, and is a complaint that carries with it a high risk of complications and morbidity.  The  differential diagnosis includes covid, flu, Rsv, Pneumonia, UTI, infection, anemia, electrolytes derangement, MI, stroke, hypoglycemia -Co morbidities that complicate the patient evaluation includes CAD, DM, HLD, HTN, atrial flutter, CHF, COPD, skin CA -Treatment includes potassium, protonix, rocephin, doxycycline, blood transfusion -Reevaluation of the patient after these medicines showed that the patient improved -PCP office notes or outside notes reviewed -Discussion with specialist GI specialist Dr. Leonides Schanz, who recommend PPI BID, blood transfusion, clear liquid diet and they will see pt tomorrow. I have consulted [facility] Dr. Jerral Ralph who agrees to see and admit pt.   -Escalation to admission/observation considered: patient is agreeable with admission  .Critical Care  Performed by: Fayrene Helper, PA-C Authorized by: Fayrene Helper, PA-C   Critical care provider statement:    Critical care time (minutes):  70   Critical care was time spent personally by me on the following activities:  Development of treatment plan with patient or surrogate, discussions with consultants, evaluation of patient's response to treatment, examination of patient, ordering and review of laboratory studies, ordering and review of radiographic studies, ordering and performing treatments and interventions, pulse oximetry, re-evaluation of patient's condition and review of old charts  Patient will receive blood transfusion for symptomatic anemia.  Currently he does not require supplemental oxygen but he did have a documented O2 sats of 87% at 1 point.  He may benefit from remdesivir and prednisone if indicated for his COVID infection.  Patient will be admitted for further care. \  BP (!) 120/57   Pulse (!) 119   Temp (!) 97.5 F (36.4 C) (Rectal)   Resp (!) 26  Ht 5\' 7"  (1.702 m)   Wt 70.8 kg   SpO2 93%   BMI 24.43 kg/m   Results for orders placed or performed during the hospital encounter of 08/15/23  CBC  with Differential   Collection Time: 08/15/23  2:08 PM  Result Value Ref Range   WBC 7.0 4.0 - 10.5 K/uL   RBC 2.83 (L) 4.22 - 5.81 MIL/uL   Hemoglobin 7.4 (L) 13.0 - 17.0 g/dL   HCT 09.8 (L) 11.9 - 14.7 %   MCV 87.3 80.0 - 100.0 fL   MCH 26.1 26.0 - 34.0 pg   MCHC 30.0 30.0 - 36.0 g/dL   RDW 82.9 (H) 56.2 - 13.0 %   Platelets 247 150 - 400 K/uL   nRBC 3.1 (H) 0.0 - 0.2 %   Neutrophils Relative % 92 %   Neutro Abs 6.4 1.7 - 7.7 K/uL   Lymphocytes Relative 3 %   Lymphs Abs 0.2 (L) 0.7 - 4.0 K/uL   Monocytes Relative 5 %   Monocytes Absolute 0.4 0.1 - 1.0 K/uL   Eosinophils Relative 0 %   Eosinophils Absolute 0.0 0.0 - 0.5 K/uL   Basophils Relative 0 %   Basophils Absolute 0.0 0.0 - 0.1 K/uL   WBC Morphology See Note    Smear Review See Note    nRBC 11 (H) 0 /100 WBC   Abs Immature Granulocytes 0.00 0.00 - 0.07 K/uL   Polychromasia PRESENT   Comprehensive metabolic panel   Collection Time: 08/15/23  2:08 PM  Result Value Ref Range   Sodium 138 135 - 145 mmol/L   Potassium 3.1 (L) 3.5 - 5.1 mmol/L   Chloride 99 98 - 111 mmol/L   CO2 27 22 - 32 mmol/L   Glucose, Bld 152 (H) 70 - 99 mg/dL   BUN 78 (H) 8 - 23 mg/dL   Creatinine, Ser 8.65 (H) 0.61 - 1.24 mg/dL   Calcium 9.7 8.9 - 78.4 mg/dL   Total Protein 5.5 (L) 6.5 - 8.1 g/dL   Albumin 2.0 (L) 3.5 - 5.0 g/dL   AST 18 15 - 41 U/L   ALT 13 0 - 44 U/L   Alkaline Phosphatase 63 38 - 126 U/L   Total Bilirubin 1.3 (H) 0.0 - 1.2 mg/dL   GFR, Estimated 34 (L) >60 mL/min   Anion gap 12 5 - 15  Brain natriuretic peptide   Collection Time: 08/15/23  2:08 PM  Result Value Ref Range   B Natriuretic Peptide 1,284.9 (H) 0.0 - 100.0 pg/mL  Troponin I (High Sensitivity)   Collection Time: 08/15/23  2:08 PM  Result Value Ref Range   Troponin I (High Sensitivity) 60 (H) <18 ng/L  POC CBG, ED   Collection Time: 08/15/23  2:10 PM  Result Value Ref Range   Glucose-Capillary 140 (H) 70 - 99 mg/dL  Resp panel by RT-PCR (RSV, Flu A&B,  Covid) Anterior Nasal Swab   Collection Time: 08/15/23  2:11 PM   Specimen: Anterior Nasal Swab  Result Value Ref Range   SARS Coronavirus 2 by RT PCR POSITIVE (A) NEGATIVE   Influenza A by PCR NEGATIVE NEGATIVE   Influenza B by PCR NEGATIVE NEGATIVE   Resp Syncytial Virus by PCR NEGATIVE NEGATIVE  Magnesium   Collection Time: 08/15/23  3:17 PM  Result Value Ref Range   Magnesium 1.5 (L) 1.7 - 2.4 mg/dL  Troponin I (High Sensitivity)   Collection Time: 08/15/23  3:17 PM  Result Value Ref Range  Troponin I (High Sensitivity) 56 (H) <18 ng/L  POC occult blood, ED Provider will collect   Collection Time: 08/15/23  3:21 PM  Result Value Ref Range   Fecal Occult Bld POSITIVE (A) NEGATIVE   CT Head Wo Contrast Result Date: 08/15/2023 CLINICAL DATA:  Altered level of consciousness EXAM: CT HEAD WITHOUT CONTRAST TECHNIQUE: Contiguous axial images were obtained from the base of the skull through the vertex without intravenous contrast. RADIATION DOSE REDUCTION: This exam was performed according to the departmental dose-optimization program which includes automated exposure control, adjustment of the mA and/or kV according to patient size and/or use of iterative reconstruction technique. COMPARISON:  04/15/2023 FINDINGS: Brain: Stable chronic small-vessel ischemic changes within the periventricular white matter. Stable encephalomalacia right cerebellar hemisphere consistent with chronic infarct. No evidence of acute infarct or hemorrhage. Lateral ventricles and remaining midline structures appear unremarkable. There are no acute extra-axial fluid collections. No mass effect. Vascular: Stable atherosclerosis.  No hyperdense vessel. Skull: Normal. Negative for fracture or focal lesion. Sinuses/Orbits: No acute finding. Other: None. IMPRESSION: 1. Stable head CT.  No acute intracranial process. Electronically Signed   By: Sharlet Salina M.D.   On: 08/15/2023 15:42   DG Chest Portable 1 View Result Date:  08/15/2023 CLINICAL DATA:  Shortness of breath and weakness beginning today. EXAM: PORTABLE CHEST 1 VIEW COMPARISON:  04/15/2023 FINDINGS: Patient is slightly rotated to the right. Lungs are adequately inflated demonstrate opacification over the mid to lower right lung. Evidence small right effusion with fluid tracking to the right apex. Minimal left base opacification. Mild stable cardiomegaly. Remainder of the exam is unchanged. IMPRESSION: 1. Opacification over the mid to lower right lung with small right effusion. Minimal left base opacification. Suspect multifocal infection. 2. Mild stable cardiomegaly. Electronically Signed   By: Elberta Fortis M.D.   On: 08/15/2023 14:38      Fayrene Helper, PA-C 08/15/23 1723    Vanetta Mulders, MD 08/18/23 [phone]

## 2023-08-15 NOTE — ED Notes (Signed)
RT at the bedside.

## 2023-08-15 NOTE — ED Notes (Signed)
Patient transported to CT 

## 2023-08-15 NOTE — ED Notes (Signed)
CCMD notified of pt.

## 2023-08-15 NOTE — ED Provider Notes (Signed)
[place] EMERGENCY DEPARTMENT AT [facility] Provider Note   CSN: [id number] Arrival date & time: 08/15/23  1318     History  Chief Complaint  Patient presents with   Shortness of Breath   Altered Mental Status    Shaun Mclaughlin is a 89 y.o. 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  Family was visiting him at facility and reported that he was tachypneic, pale, weak, lethargic.  They report that he has been having generalized decrease in mental status since starting to receive radiation for squamous cell carcinoma around 2 months ago.  HPI     Home Medications Prior to Admission medications   Medication Sig Start Date End Date Taking? Authorizing Provider  albuterol (PROVENTIL HFA;VENTOLIN HFA) 108 (90 Base) MCG/ACT inhaler Inhale 2 puffs into the lungs every 6 (six) hours as needed for wheezing or shortness of breath. 02/19/17   Croitoru, Mihai, MD  apixaban (ELIQUIS) 2.5 MG TABS tablet Take 1 tablet (2.5 mg total) by mouth 2 (two) times daily. 07/08/23   Reather Littler D, NP  apixaban (ELIQUIS) 2.5 MG TABS tablet Take 1 tablet (2.5 mg total) by mouth 2 (two) times daily. 07/08/23   Reather Littler D, NP  atorvastatin (LIPITOR) 40 MG tablet Take 1 tablet (40 mg total) by mouth daily. 03/20/16   Croitoru, Mihai, MD  Cyanocobalamin 500 MCG SUBL Take 500 mcg by mouth daily at 6 (six) AM. 09/08/22   [provider]  furosemide (LASIX) 40 MG tablet Take 1 tablet (40 mg total) by mouth daily. 07/08/23 10/06/23  Reather Littler D, NP  KRILL OIL PO Take 1 capsule by mouth daily.     [provider]  latanoprost (XALATAN) 0.005 % ophthalmic solution Place 1 drop into both eyes at bedtime. 09/05/19   [provider]  Magnesium Oxide 400 MG CAPS Take 1 capsule (400 mg total) by mouth in the morning and at bedtime. 07/07/23   Croitoru, Mihai, MD  metoprolol tartrate (LOPRESSOR) 25 MG tablet TAKE 1 TABLET BY MOUTH TWICE DAILY 09/03/22   Croitoru, Mihai, MD  propylthiouracil (PTU) 50 MG tablet Take 50 mg by mouth daily.    [provider]  triamcinolone (NASACORT) 55 MCG/ACT AERO nasal inhaler Place 2 sprays into the nose daily as needed (ALLERGIES).    [provider]  trolamine salicylate (ASPERCREME) 10 % cream Apply 1 application topically as needed for muscle pain.    [provider]      Allergies    Azithromycin and Cefuroxime    Review of Systems   Review of Systems  Respiratory:  Positive for shortness of breath.   Neurological:  Positive for weakness.  Psychiatric/Behavioral:  Positive for confusion.   All other systems reviewed and are negative.   Physical Exam Updated Vital Signs BP (!) 121/51   Pulse (!) 33   Temp (!) 97.5 F (36.4 C) (Rectal)   Resp (!) 23   Ht 5\' 7"  (1.702 m)   Wt 70.8 kg   SpO2 99%   BMI 24.43 kg/m  Physical Exam Vitals and nursing note reviewed.  Constitutional:      General: He is not in acute distress.    Appearance: Normal appearance. He is ill-appearing.  HENT:     Head: Normocephalic and  atraumatic.  Eyes:     General:        Right eye: No discharge.        Left eye: No discharge.  Cardiovascular:     Rate and Rhythm: Bradycardia present. Rhythm irregular.     Heart sounds: No murmur heard.    No friction rub. No gallop.  Pulmonary:     Effort: Pulmonary effort is normal. Tachypnea present.     Breath sounds: Normal breath sounds.     Comments: Some coarse rhonchi, no wheezing, rales, tachypneic, increased work of breathing. Abdominal:     General: Bowel sounds are normal.     Palpations: Abdomen is soft.  Skin:    General: Skin is warm and dry.     Capillary Refill: Capillary refill takes less than  2 seconds.     Comments: Appropriately healing skin cancer on left cheek with no evidence of purulent drainage.  He has some chronic appearing ulcers on bilateral lower extremities.  No peripheral edema.  His extremities are somewhat cold but pulses are intact, cap refill intact.  Neurological:     Mental Status: He is alert and oriented to person, place, and time.     Comments: No focal neurologic deficits, Moves all 4 limbs spontaneously, CN II through XII grossly intact, can ambulate with some assistance, intact sensation throughout.  On my exam he was alert and oriented x 3, but EMS just prior to arrival reported that he was only alert and oriented to self.  Some transient confusion noted  Psychiatric:        Mood and Affect: Mood normal.        Behavior: Behavior normal.     ED Results / Procedures / Treatments   Labs (all labs ordered are listed, but only abnormal results are displayed) Labs Reviewed  CBC WITH DIFFERENTIAL/PLATELET - Abnormal; Notable for the following components:      Result Value   RBC 2.83 (*)    Hemoglobin 7.4 (*)    HCT 24.7 (*)    RDW 19.4 (*)    nRBC 3.1 (*)    Lymphs Abs 0.2 (*)    nRBC 11 (*)    All other components within normal limits  COMPREHENSIVE METABOLIC PANEL - Abnormal; Notable for the following components:   Potassium 3.1 (*)    Glucose, Bld 152 (*)    BUN 78 (*)    Creatinine, Ser 1.89 (*)    Total Protein 5.5 (*)    Albumin 2.0 (*)    Total Bilirubin 1.3 (*)    GFR, Estimated 34 (*)    All other components within normal limits  BRAIN NATRIURETIC PEPTIDE - Abnormal; Notable for the following components:   B Natriuretic Peptide 1,284.9 (*)    All other components within normal limits  CBG MONITORING, ED - Abnormal; Notable for the following components:   Glucose-Capillary 140 (*)    All other components within normal limits  POC OCCULT BLOOD, ED - Abnormal; Notable for the following components:   Fecal Occult Bld POSITIVE (*)     All other components within normal limits  TROPONIN I (HIGH SENSITIVITY) - Abnormal; Notable for the following components:   Troponin I (High Sensitivity) 60 (*)    All other components within normal limits  RESP PANEL BY RT-PCR (RSV, FLU A&B, COVID)  RVPGX2  URINALYSIS, ROUTINE W REFLEX MICROSCOPIC  TROPONIN I (HIGH SENSITIVITY)    EKG EKG Interpretation Date/Time:  Sunday August 15 2023  13:45:18 EST Ventricular Rate:  83 PR Interval:    QRS Duration:  155 QT Interval:  418 QTC Calculation: 492 R Axis:   251  Text Interpretation: Atrial fibrillation RBBB and LAFB Probable lateral infarct, old Similar to prior Confirmed by Vivi Barrack [phone]) on 08/15/2023 1:58:51 PM  Radiology DG Chest Portable 1 View Result Date: 08/15/2023 CLINICAL DATA:  Shortness of breath and weakness beginning today. EXAM: PORTABLE CHEST 1 VIEW COMPARISON:  04/15/2023 FINDINGS: Patient is slightly rotated to the right. Lungs are adequately inflated demonstrate opacification over the mid to lower right lung. Evidence small right effusion with fluid tracking to the right apex. Minimal left base opacification. Mild stable cardiomegaly. Remainder of the exam is unchanged. IMPRESSION: 1. Opacification over the mid to lower right lung with small right effusion. Minimal left base opacification. Suspect multifocal infection. 2. Mild stable cardiomegaly. Electronically Signed   By: Elberta Fortis M.D.   On: 08/15/2023 14:38    Procedures Procedures    Medications Ordered in ED Medications  cefTRIAXone (ROCEPHIN) 1 g in sodium chloride 0.9 % 100 mL IVPB (1 g Intravenous New Bag/Given 08/15/23 1522)  pantoprazole (PROTONIX) injection 40 mg (has no administration in time range)  doxycycline (VIBRA-TABS) tablet 100 mg (100 mg Oral Given 08/15/23 1517)    ED Course/ Medical Decision Making/ A&P                                 Medical Decision Making Amount and/or Complexity of Data Reviewed Labs:  ordered. Radiology: ordered.  Risk Prescription drug management.   This patient is a 89 y.o. male who presents to the ED for concern of altered mental status, shortness of breath, this involves an extensive number of treatment options, and is a complaint that carries with it a high risk of complications and morbidity. The emergent differential diagnosis prior to evaluation includes, but is not limited to,  CVA, seizure, hypotension, sepsis, hypoglycemia, hypoxic encephalopathy, metabolic encephalopathy, polypharmacy, substance abuse, developing dementia or alzheimers, meningitis, encephalitis, hypertensive emergency, other systemic infection, acute alcohol intoxication, acute alcohol or other drug withdrawal or psychiatric manifestation vs asthma exacerbation, COPD exacerbation, acute upper respiratory infection, acute bronchitis, chronic bronchitis, interstitial lung disease, ARDS, PE, pneumonia, atypical ACS, carbon monoxide poisoning, spontaneous pneumothorax, new CHF vs CHF exacerbation, versus other . This is not an exhaustive differential.   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 Pertinent results include: Reviewed lab work, imaging from previous emergency department evaluation, notably with hemoglobin around 11 during evaluation in December.  Physical Exam: Physical exam performed. The pertinent findings include: Patient somewhat ill-appearing but nontoxic, nonseptic.  He has an irregular rhythm with normal rate, although had 1 episode of some bradycardia.  He has been tachypneic, but with stable oxygen saturation on room air.  Somewhat soft blood pressure on arrival, 103/59, improved to 121/51 on reassessment.  He is afebrile on rectal temperature.  Lab  Tests: I ordered, and personally interpreted labs.  The pertinent results include: CBC with no leukocytosis, he has a new significant anemia, hemoglobin 7.4, his Hemoccult is positive, stool is not grossly melanotic so may be slow bleed over time secondary to his anticoagulation, he is on Eliquis.  Initial troponin elevated at 60, his BNP is elevated at 1200,  may be due to demand ischemia, but will evaluate for acute cardiac pathology.  He has some hypokalemia, testing 3.1, and new AKI, BUN 78, creatinine 1.9 from baseline around 1.4-1.5.  Mild elevation of total bilirubin 1.3, no focal abdominal pain, low clinical suspicion of acute gallbladder or liver pathology.   Imaging Studies: I ordered imaging studies including plain film chest x-ray. I independently visualized and interpreted imaging which showed suspicious for multifocal pneumonia. I agree with the radiologist interpretation.   Cardiac Monitoring:  The patient was maintained on a cardiac monitor.  My attending physician Dr. Jearld Fenton viewed and interpreted the cardiac monitored which showed an underlying rhythm of: A-fib, similar to prior. I agree with this interpretation.   Medications: I ordered medication including doxycycline, Rocephin for community-acquired pneumonia, he has an azithromycin allergy, he does have a listed allergy to cefuroxime but has received Ancef in the past, and his allergy to cefuroxime is somewhat nonspecific reporting abdominal pain, after discussing with the pharmacist I do think that he is stable to continue with Rocephin, we will closely monitor and anticipate admission.  3:28 PM Care of Shaun Mclaughlin transferred to [facility] and Dr. Deretha Emory at the end of my shift as the patient will require reassessment once labs/imaging have resulted. Patient presentation, ED course, and plan of care discussed with review of all pertinent labs and imaging. Please see his/her note for further details regarding further ED course and  disposition. Plan at time of handoff is plan for admission due to altered mental status, multifocal pneumonia, new anemia secondary to likely GI bleed, he will need GI consultation, we will start him on Protonix, he is pending CT of the head, and UA, viral panel, repeat troponin prior to admission. This may be altered or completely changed at the discretion of the oncoming team pending results of further workup.   Final Clinical Impression(s) / ED Diagnoses Final diagnoses:  None    Rx / DC Orders ED Discharge Orders     None         Olene Floss, PA-C 08/15/23 1528    Loetta Rough, MD 08/17/23 2248

## 2023-08-16 ENCOUNTER — Inpatient Hospital Stay (HOSPITAL_COMMUNITY): Payer: Medicare Other

## 2023-08-16 ENCOUNTER — Ambulatory Visit: Payer: Medicare Other

## 2023-08-16 DIAGNOSIS — D649 Anemia, unspecified: Secondary | ICD-10-CM

## 2023-08-16 DIAGNOSIS — I509 Heart failure, unspecified: Secondary | ICD-10-CM | POA: Diagnosis not present

## 2023-08-16 DIAGNOSIS — U071 COVID-19: Secondary | ICD-10-CM

## 2023-08-16 DIAGNOSIS — G9341 Metabolic encephalopathy: Secondary | ICD-10-CM | POA: Diagnosis not present

## 2023-08-16 DIAGNOSIS — E876 Hypokalemia: Secondary | ICD-10-CM

## 2023-08-16 DIAGNOSIS — N179 Acute kidney failure, unspecified: Secondary | ICD-10-CM | POA: Diagnosis not present

## 2023-08-16 DIAGNOSIS — I10 Essential (primary) hypertension: Secondary | ICD-10-CM

## 2023-08-16 DIAGNOSIS — Z711 Person with feared health complaint in whom no diagnosis is made: Secondary | ICD-10-CM

## 2023-08-16 DIAGNOSIS — R195 Other fecal abnormalities: Secondary | ICD-10-CM

## 2023-08-16 DIAGNOSIS — Z789 Other specified health status: Secondary | ICD-10-CM

## 2023-08-16 DIAGNOSIS — Z515 Encounter for palliative care: Secondary | ICD-10-CM

## 2023-08-16 DIAGNOSIS — I484 Atypical atrial flutter: Secondary | ICD-10-CM | POA: Diagnosis not present

## 2023-08-16 DIAGNOSIS — E0590 Thyrotoxicosis, unspecified without thyrotoxic crisis or storm: Secondary | ICD-10-CM

## 2023-08-16 DIAGNOSIS — J1282 Pneumonia due to coronavirus disease 2019: Secondary | ICD-10-CM

## 2023-08-16 DIAGNOSIS — Z7189 Other specified counseling: Secondary | ICD-10-CM

## 2023-08-16 DIAGNOSIS — Z66 Do not resuscitate: Secondary | ICD-10-CM

## 2023-08-16 LAB — COMPREHENSIVE METABOLIC PANEL
ALT: 16 U/L (ref 0–44)
AST: 20 U/L (ref 15–41)
Albumin: 2 g/dL — ABNORMAL LOW (ref 3.5–5.0)
Alkaline Phosphatase: 64 U/L (ref 38–126)
Anion gap: 11 (ref 5–15)
BUN: 80 mg/dL — ABNORMAL HIGH (ref 8–23)
CO2: 26 mmol/L (ref 22–32)
Calcium: 9.5 mg/dL (ref 8.9–10.3)
Chloride: 101 mmol/L (ref 98–111)
Creatinine, Ser: 2.14 mg/dL — ABNORMAL HIGH (ref 0.61–1.24)
GFR, Estimated: 29 mL/min — ABNORMAL LOW (ref >60)
Glucose, Bld: 190 mg/dL — ABNORMAL HIGH (ref 70–99)
Potassium: 3.8 mmol/L (ref 3.5–5.1)
Sodium: 138 mmol/L (ref 135–145)
Total Bilirubin: 1.8 mg/dL — ABNORMAL HIGH (ref 0.0–1.2)
Total Protein: 5.9 g/dL — ABNORMAL LOW (ref 6.5–8.1)

## 2023-08-16 LAB — CBC
HCT: 29.7 % — ABNORMAL LOW (ref 39.0–52.0)
Hemoglobin: 9.2 g/dL — ABNORMAL LOW (ref 13.0–17.0)
MCH: 27 pg (ref 26.0–34.0)
MCHC: 31 g/dL (ref 30.0–36.0)
MCV: 87.1 fL (ref 80.0–100.0)
Platelets: 206 10*3/uL (ref 150–400)
RBC: 3.41 MIL/uL — ABNORMAL LOW (ref 4.22–5.81)
RDW: 17.9 % — ABNORMAL HIGH (ref 11.5–15.5)
WBC: 11.5 10*3/uL — ABNORMAL HIGH (ref 4.0–10.5)
nRBC: 1.6 % — ABNORMAL HIGH (ref 0.0–0.2)

## 2023-08-16 LAB — BPAM RBC
Blood Product Expiration Date: 202502132359
ISSUE DATE / TIME: 202501192005
Unit Type and Rh: 6200

## 2023-08-16 LAB — HEMOGLOBIN AND HEMATOCRIT, BLOOD
HCT: 28.7 % — ABNORMAL LOW (ref 39.0–52.0)
Hemoglobin: 8.9 g/dL — ABNORMAL LOW (ref 13.0–17.0)

## 2023-08-16 LAB — TYPE AND SCREEN
ABO/RH(D): A POS
Antibody Screen: NEGATIVE
Unit division: 0

## 2023-08-16 LAB — TSH: TSH: 1.499 u[IU]/mL (ref 0.350–4.500)

## 2023-08-16 LAB — PROCALCITONIN: Procalcitonin: 1.53 ng/mL

## 2023-08-16 LAB — PREPARE RBC (CROSSMATCH)

## 2023-08-16 LAB — MAGNESIUM: Magnesium: 2.4 mg/dL (ref 1.7–2.4)

## 2023-08-16 LAB — C-REACTIVE PROTEIN: CRP: 18 mg/dL — ABNORMAL HIGH (ref <1.0)

## 2023-08-16 LAB — T4, FREE: Free T4: 0.94 ng/dL (ref 0.61–1.12)

## 2023-08-16 MED ORDER — IPRATROPIUM-ALBUTEROL 0.5-2.5 (3) MG/3ML IN SOLN
3.0000 mL | Freq: Three times a day (TID) | RESPIRATORY_TRACT | Status: DC
  Administered 2023-08-16: 3 mL via RESPIRATORY_TRACT
  Filled 2023-08-16 (×2): qty 3

## 2023-08-16 MED ORDER — PREDNISONE 20 MG PO TABS: 40.0000 mg | ORAL_TABLET | Freq: Every day | ORAL | Status: DC

## 2023-08-16 NOTE — Progress Notes (Signed)
Modified Barium Swallow Study  Patient Details  Name: Shaun Mclaughlin MRN: [id number] Date of Birth: Nov 11, 1934  Today's Date: 08/16/2023  Modified Barium Swallow completed.  Full report located under Chart Review in the Imaging Section.  History of Present Illness Shaun Mclaughlin is a 89 y.o. male admitted with confusion and shortness of breath. Pt found to have pneumonia related to COVID-19 and possibly superimposed bacterial pna.  MD writes " He has significant infiltrates-mostly in his right lung. Suspect this is mostly aspiration-unclear if he has some COVID-19 PNA as well."  Also upper GI bleeding with acute blood loss anemia.  Pt apparently has been residing at [facility] for the past year.   Pt with medical history significant of HTN, HLD, DM-2, CAD, persistent atrial fibrillation chronic HFpEF, CAD squamous cell carcinoma of the skin (face)-currently getting radiation .   Clinical Impression Pt demonstrates a moderate/severe oropharyngeal dysphagia with chronic and gross silent aspiration of liquid consistencies and mild residue of solid textures (DIGEST Score of 3). Pt exhibits poor oral control of liquids with premature spillage and lingual residue that spills post swallow. Initiation of swallow can at times be significantly delayed with liquids pooling in the pyriform sinuses. Pt has silent aspiration of all liquids, primarily due to pooling in the pyriforms. Cued cough is completely ineffective in clearing aspirate; cough is a repetitive huff without strength or crispness. Apart from delay and sensory impairment, pt also has moderate pharyngeal weakness with decreased hyoid excursion, laryngeal elevation and partial epiglottic deflection. Barium pooling around the glottis and vestibule shows an abnormal rounded tissue on the anterior aspect of the vocal folds. Esophageal sweep also shows severe stasis suggesting a motility impairment. Pt is at high risk of aspiration and may have been  suffering from a chronic dysphagia prior to admission that is now worse in severity due to weakness. Will f/u for solutions to diet and risk, but would advise discussion with palliative care to consider diet with known risk given severity. Factors that may increase risk of adverse event in presence of aspiration [employer] 2021): Frail or deconditioned;Respiratory or GI disease;Poor general health and/or compromised immunity;Aspiration of thick, dense, and/or acidic materials;Frequent aspiration of large volumes;Weak cough  Swallow Evaluation Recommendations Recommendations: Ice chips PRN after oral care Medication Administration: Crushed with puree Supervision: Patient able to self-feed Oral care recommendations: Oral care QID (4x/day)      Ferrin Liebig, Riley Nearing 08/16/2023,3:11 PM

## 2023-08-16 NOTE — Consult Note (Addendum)
Consultation Note Date: 08/16/2023   Patient Name: Shaun Mclaughlin  DOB: 27-May-1935  MRN: [id number]  Age / Sex: 89 y.o., male  PCP: Tally Joe, MD Referring Physician: Marcelino Duster, MD  Reason for Consultation: Establishing goals of care  HPI/Patient Profile: 89 y.o. male  with past medical history of HTN, HLD, DM-2, CAD, persistent atrial fibrillation chronic HFpEF, CAD squamous cell carcinoma of the skin (face)-currently getting radiation was admitted on 08/15/2023 with upper GIB with acute blood lose anemia, multifocal pneumonia/suspect aspiration pneumonia vs COVID 19 pneumonia, AKI on CKD 3b, acute metabolic encephalopathy, acute on chronic HFpEF, hypokalemia, and hyomagnesemia.   Clinical Assessment and Goals of Care: I have reviewed medical records including EPIC notes, labs, any available advanced directives, and imaging. Received report from primary RN - no acute concerns. Per RN, patient's mental status seems to wax/wane.   Patient is very hard of hearing. Masking due to airborne precautions in context of HOH creates barrier to communication.   Went to visit patient at bedside - no family/visitors present. Patient was lying in bed awake, alert, oriented, and able to participate in conversation as able to hear. No signs or non-verbal gestures of pain or discomfort noted. No respiratory distress, increased work of breathing, or secretions noted. Patient denies shortness of breath. Endorses discomfort in his diaphragm area.   Attempted to meet with patient  to discuss diagnosis, prognosis, GOC, EOL wishes, disposition, and options.  I introduced Palliative Medicine as specialized medical care for people living with serious illness. It focuses on providing relief from the symptoms and stress of a serious illness. The goal is to improve quality of life for both the patient and the family.  Discussion  with patient is limited by communication barrier. He is able to tell me he has a wife of 58 years and 5 children. He indicates his DIL/Shaun Mclaughlin is to be his surrogate Management consultant.    We discussed patient's current illness and what it means in the larger context of patient's on-going co-morbidities. Patient does have a clear understanding of his current acute medical situation. The difference between aggressive medical intervention and comfort care was considered in light of the patient's goals of care. Patient defers decisions to his daughter in law/Shaun Mclaughlin.  1:00 PM Called Shaun Mclaughlin - emotional support provided. Therapeutic listening provided as she reflects on patient's interval history since admission and his gradual decline that lead to his admission. She indicates patient's spouse has dementia and would not be able to participate in decision making.  She indicates family are "preparing and looking for guidance" with decision making regarding next steps. Family are traveling from out of state and they would like to meet together in person tomorrow - she will call PMT with preferred date/time.  Discussed goals of care in the interim considering patient's high risk for decompensation. Confirmed goals are to treat the treatable; however, if decline, no escalation of care - transition to full comfort.   Listened as Shaun Mclaughlin reflects on previous discussions with patient - he has indicated "he doesn't want to prolong things." DNR/DNI also confirmed.  Questions and concerns were addressed. The patient/family was encouraged to call with questions and/or concerns. PMT card was provided.  1:24 PM Shaun Mclaughlin called PMT phone - scheduled family meeting for tomorrow 1/21 at 9:30a.   Discussed case with SLP.   Primary Decision Maker: PATIENT - he defers medical decision making to his daughter in law/Shaun Mclaughlin    SUMMARY OF RECOMMENDATIONS   Continue gentle  medical care to treat the treatable No escalation of  care in the event of decline. If decline, transition to full comfort Confirmed DNR/DNI Family meeting scheduled for tomorrow 1/21 at 9:30am  PMT will continue to follow and support holistically   Code Status/Advance Care Planning: DNR  Palliative Prophylaxis:  Aspiration, Frequent Pain Assessment, Oral Care, and Turn Reposition  Additional Recommendations (Limitations, Scope, Preferences): No Artificial Feeding and No Tracheostomy  Psycho-social/Spiritual:  Desire for further Chaplaincy support:no Created space and opportunity for patient and family to express thoughts and feelings regarding patient's current medical situation.  Emotional support and therapeutic listening provided.  Prognosis:  Unable to determine  Discharge Planning: To Be Determined      Primary Diagnoses: Present on Admission:  Acute metabolic encephalopathy  CAD,Hx RCA stent Jan '03. patent at cath 06/2012   HTN (hypertension)  Atrial flutter (HCC)  Chronic heart failure with preserved ejection fraction (HFpEF) (HCC)  Hyperthyroidism  Acute renal failure superimposed on stage 3a chronic kidney disease (HCC)  Squamous cell carcinoma of skin of unspecified parts of face   I have reviewed the medical record, interviewed the patient and family, and examined the patient. The following aspects are pertinent.  Past Medical History:  Diagnosis Date   Acute on chronic combined systolic and diastolic congestive heart failure, NYHA class 2 (HCC) 06/29/2014   Arthritis    "maybe a little bit in some joints" (08/04/2012)   Atherosclerotic renal artery stenosis, unilateral (HCC)    lleft renal artery stenosis by duplex ultrasound   CAD (coronary artery disease),Hx RCA stenting-patent 06/2012  08/05/2012   Chronic anticoagulation, coumadin for PAF 08/05/2012   Chronic bronchitis (HCC)    "about q yr" (08/04/2012)   DM (diabetes mellitus) (HCC) 08/05/2012   External bleeding hemorrhoids ~ 2003; 2008; 2013   "removed  polyps and no bleeding since" (08/04/2012)   GERD (gastroesophageal reflux disease)    Hip fracture (HCC) 10/2016   RIGHT FEMORAL NECK    HTN (hypertension) 08/05/2012   Hypercholesteremia    Hyperlipidemia 08/05/2012   Hyperthyroidism    Kidney stone 1980's   PAF (paroxysmal atrial fibrillation), maintaining SR 08/05/2012   Pneumonia ~ 2008   PVD (peripheral vascular disease) with claudication, Lt Upper ext. pain, and known 80-90%distal Lt. subclavian artery stenosis  08/05/2012   S/P angioplasty with stent to Lt. subclavian artery -Nitrinol stent 08/04/12 08/05/2012   Type II diabetes mellitus (HCC)    "take RX; I'm prediabetic; don't have to  check my CBG qd; keep my weight down" (08/04/2012)   Social History   Socioeconomic History   Marital status: Married    Spouse name: Not on file   Number of children: Not on file   Years of education: Not on file   Highest education level: Not on file  Occupational History   Not on file  Tobacco Use   Smoking status: Every Day    Current packs/day: 0.50    Average packs/day: 0.5 packs/day for 25.0 years (12.5 ttl pk-yrs)    Types: Cigarettes, Pipe   Smokeless tobacco: Never   Tobacco comments:    08/04/2012 "still smoke a cigar q once in awhile"    05/14/2023 Patient "smokes 2-5 cigarettes daily"  Substance and Sexual Activity   Alcohol use: Yes    Comment: OCCASIONAL   Drug use: No   Sexual activity: Never  Other Topics Concern   Not on file  Social History Narrative   Not on file   Social  Drivers of Corporate investment banker Strain: Not on file  Food Insecurity: No Food Insecurity (07/13/2023)   Hunger Vital Sign    Worried About Running Out of Food in the Last Year: Never true    Ran Out of Food in the Last Year: Never true  Transportation Needs: No Transportation Needs (07/13/2023)   PRAPARE - Administrator, Civil Service (Medical): No    Lack of Transportation (Non-Medical): No  Recent Concern: Transportation  Needs - Unmet Transportation Needs (07/13/2023)   PRAPARE - Administrator, Civil Service (Medical): Yes    Lack of Transportation (Non-Medical): Yes  Physical Activity: Not on file  Stress: Not on file  Social Connections: Not on file   Family History  Problem Relation Age of Onset   Stroke Mother 34   Hypertension Mother    Coronary artery disease Father 77   Scheduled Meds:  albuterol  2.5 mg Nebulization TID   arformoterol  15 mcg Nebulization BID   atorvastatin  40 mg Oral Daily   budesonide (PULMICORT) nebulizer solution  0.25 mg Nebulization BID   doxycycline  100 mg Oral Q12H   guaiFENesin  600 mg Oral BID   ipratropium  0.5 mg Nebulization TID   methylPREDNISolone (SOLU-MEDROL) injection  40 mg Intravenous Q12H   metoprolol tartrate  25 mg Oral BID   pantoprazole (PROTONIX) IV  40 mg Intravenous Q6H   Followed by   Melene Muller ON 08/19/2023] pantoprazole (PROTONIX) IV  40 mg Intravenous Q12H   propylthiouracil  50 mg Oral Daily   sodium chloride flush  3 mL Intravenous Q12H   Continuous Infusions:  sodium chloride     ampicillin-sulbactam (UNASYN) IV Stopped (08/16/23 1116)   PRN Meds:.sodium chloride, acetaminophen **OR** acetaminophen, ondansetron **OR** ondansetron (ZOFRAN) IV, polyethylene glycol, sodium chloride flush Medications Prior to Admission:  Prior to Admission medications   Medication Sig Start Date End Date Taking? Authorizing Provider  apixaban (ELIQUIS) 2.5 MG TABS tablet Take 1 tablet (2.5 mg total) by mouth 2 (two) times daily. 07/08/23  Yes West, Katlyn D, NP  atorvastatin (LIPITOR) 40 MG tablet Take 1 tablet (40 mg total) by mouth daily. 03/20/16  Yes Croitoru, Mihai, MD  Cyanocobalamin 500 MCG SUBL Take 500 mcg by mouth daily at 6 (six) AM. 09/08/22  Yes [provider]  furosemide (LASIX) 40 MG tablet Take 1 tablet (40 mg total) by mouth daily. 07/08/23 10/06/23 Yes West, Katlyn D, NP  KRILL OIL PO Take 1 capsule by mouth daily.     Yes [provider]  latanoprost (XALATAN) 0.005 % ophthalmic solution Place 1 drop into both eyes at bedtime. 09/05/19  Yes [provider]  Magnesium Oxide 400 MG CAPS Take 1 capsule (400 mg total) by mouth in the morning and at bedtime. Patient taking differently: Take 400 mg by mouth daily. 07/07/23  Yes Croitoru, Mihai, MD  propylthiouracil (PTU) 50 MG tablet Take 50 mg by mouth daily.   Yes [provider]  triamcinolone (NASACORT) 55 MCG/ACT AERO nasal inhaler Place 2 sprays into the nose daily.   Yes [provider]  trolamine salicylate (ASPERCREME) 10 % cream Apply 1 application topically as needed for muscle pain.   Yes [provider]  albuterol (PROVENTIL HFA;VENTOLIN HFA) 108 (90 Base) MCG/ACT inhaler Inhale 2 puffs into the lungs every 6 (six) hours as needed for wheezing or shortness of breath. Patient not taking: Reported on 08/15/2023 02/19/17   Croitoru, [place],  MD  apixaban (ELIQUIS) 2.5 MG TABS tablet Take 1 tablet (2.5 mg total) by mouth 2 (two) times daily. Patient not taking: Reported on 08/15/2023 07/08/23   Reather Littler D, NP  metoprolol tartrate (LOPRESSOR) 25 MG tablet TAKE 1 TABLET BY MOUTH TWICE DAILY Patient not taking: Reported on 08/15/2023 09/03/22   Croitoru, Rachelle Hora, MD   Allergies  Allergen Reactions   Azithromycin Dermatitis   Cefuroxime Other (See Comments)    Abdominal pain   Review of Systems  Constitutional:  Positive for fatigue.  Respiratory:  Negative for shortness of breath.   Gastrointestinal:  Negative for nausea and vomiting.  Neurological:  Positive for weakness.  All other systems reviewed and are negative.   Physical Exam Vitals and nursing note reviewed.  Constitutional:      General: He is not in acute distress.    Appearance: He is ill-appearing.  Pulmonary:     Effort: No respiratory distress.  Skin:    General: Skin is warm and dry.  Neurological:     Mental Status: He is alert and  oriented to person, place, and time.     Motor: Weakness present.  Psychiatric:        Attention and Perception: Attention normal.        Behavior: Behavior is cooperative.        Cognition and Memory: Cognition normal.     Vital Signs: BP 132/64   Pulse (!) 134   Temp (!) 96.9 F (36.1 C) (Oral)   Resp 17   Ht 5\' 7"  (1.702 m)   Wt 70.8 kg   SpO2 99%   BMI 24.43 kg/m  Pain Scale: 0-10   Pain Score: 0-No pain   SpO2: SpO2: 99 % O2 Device:SpO2: 99 % O2 Flow Rate: .   IO: Intake/output summary:  Intake/Output Summary (Last 24 hours) at 08/16/2023 1158 Last data filed at 08/16/2023 0024 Gross per 24 hour  Intake 559.58 ml  Output --  Net 559.58 ml    LBM:   Baseline Weight: Weight: 70.8 kg Most recent weight: Weight: 70.8 kg     Palliative Assessment/Data: PPS 10%, NPO due to severe aspiration risk     Time In: 1155 Time Out: 1325 Time Total: 90 minutes  Signed by: Haskel Khan, NP   Please contact Palliative Medicine Team phone at [phone] for questions and concerns.  For individual provider: See Amion  *Portions of this note are a verbal dictation therefore any spelling and/or grammatical errors are due to the "[employer]" system interpretation.

## 2023-08-16 NOTE — Progress Notes (Signed)
Progress Note   Patient: Shaun Mclaughlin KXF:[id number] DOB: Dec 22, 1934 DOA: 08/15/2023     1 DOS: the patient was seen and examined on 08/16/2023   Brief hospital course: No notes on file  Assessment and Plan: Upper GI bleeding. Acute blood loss anemia- He has dark stools, FOBT positive. Drop in hemoglobin to 7.4. Hemoglobin today 9.8 s/p 1 unit of PRBC transfusion IV Protonix 40 mg Q6 hourly. Continue to hold Eliquis. GI evaluation is appreciated.  He is not a candidate for endoscopy.  Consider EGD if persistent bleeding with drop in H&H.  Continue to monitor H&H every 8 and transfuse as needed.   Multifocal pneumonia-right> left-suspect aspiration pneumonia vs COVID-19 PNA In the setting of COVID-19 versus aspiration pneumonia.   S/p SLP evaluation-he is high risk for aspiration.  Continue n.p.o. barium swallow evaluation pending. Continue Unasyn/doxycycline.  Will taper steroids. Continue COVID isolation Continue pulmonary toileting with incentive spirometry/flutter valve/chest PT, mucolytic's  Family aware that if he deteriorates any further-he is already on maximal medical treatment-and that next step would be transitioning to hospice care.   AKI on CKD stage IIIb AKI-likely hemodynamically mediated due to GI bleeding/pneumonia, poor oral intake Avoid nephrotoxic agents. Bladder scans per floor protocol. Monitor daily renal function, electrolytes.   Acute metabolic encephalopathy In the setting of pneumonia, AKI, GI bleed He has hard of hearing, continue supportive care. Delirium precautions.   Acute on chronic HFpEF Patient is not short of breath nor hypoxic. He got IV Lasix in the emergency department. Monitor daily weights, strict input and output. Hold Lasix given renal dysfunction.   Persistent atrial fibrillation Rate controlled-continue metoprolol with holding parameters Hold due to GI bleed.   History of CAD-s/p PCI to RCA 2003 No chest pain.  Continue  Beta-blocker/statin   PAD-history of left subclavian artery stenosis-s/p prior stenting Monitor blood pressure on right arm per prior note.   Hypokalemia Improved status post replacement. Monitor daily electrolytes.     Hypomagnesemia Repleted.   COPD Not in exacerbation Continue bronchodilators. Will taper steroids as he is not hypoxic.   Hyperthyroidism Continue propylthiouracil. TSH, free T4 within normal limits.   History of squamous cell carcinoma of the skin-involving his left temple Outpatient radiation therapy Supportive care in the interim.   Palliative care consultation appreciated. Family to make tomorrow for goals of care discussion.    Out of bed to chair. Incentive spirometry. Nursing supportive care. Fall, aspiration precautions. DVT prophylaxis   Code Status: Limited: Do not attempt resuscitation (DNR) -DNR-LIMITED -Do Not Intubate/DNI   Subjective: Patient is seen and examined today morning. He is lying in bed, able to answer me. Has cough, eating poor.  Did not get out of bed, currently on room air.  Physical Exam: Vitals:   08/16/23 0810 08/16/23 0947 08/16/23 1043 08/16/23 1300  BP:  132/64  (!) 119/104  Pulse:  (!) 134  81  Resp:  17    Temp:   (!) 96.9 F (36.1 C) (!) 96.1 F (35.6 C)  TempSrc:   Oral Axillary  SpO2: 100% 99%  95%  Weight:      Height:        General - Elderly Caucasian ill male, no apparent distress HEENT - PERRLA, EOMI, atraumatic head, non tender sinuses. Lung - Clear, basal rales, rhonchi, no wheezes. Heart - S1, S2 heard, no murmurs, rubs, 1+ pedal edema. Abdomen - Soft, non tender, distended, bowel sounds good Neuro - Alert, awake and oriented, non focal exam.  Skin - Warm and dry.  Data Reviewed:      Latest Ref Rng & Units 08/16/2023    5:34 AM 08/15/2023    2:08 PM 04/19/2023    5:37 AM  CBC  WBC 4.0 - 10.5 K/uL 11.5  7.0  8.2   Hemoglobin 13.0 - 17.0 g/dL 9.2  7.4  16.1   Hematocrit 39.0 - 52.0 % 29.7   24.7  36.3   Platelets 150 - 400 K/uL 206  247  207       Latest Ref Rng & Units 08/16/2023    5:34 AM 08/15/2023    2:08 PM 07/06/2023    9:50 AM  BMP  Glucose 70 - 99 mg/dL 096  045  409   BUN 8 - 23 mg/dL 80  78  45   Creatinine 0.61 - 1.24 mg/dL 8.11  9.14  7.82   BUN/Creat Ratio 10 - 24   29   Sodium 135 - 145 mmol/L 138  138  139   Potassium 3.5 - 5.1 mmol/L 3.8  3.1  4.9   Chloride 98 - 111 mmol/L 101  99  104   CO2 22 - 32 mmol/L 26  27  23    Calcium 8.9 - 10.3 mg/dL 9.5  9.7  8.9    CT Head Wo Contrast Result Date: 08/15/2023 CLINICAL DATA:  Altered level of consciousness EXAM: CT HEAD WITHOUT CONTRAST TECHNIQUE: Contiguous axial images were obtained from the base of the skull through the vertex without intravenous contrast. RADIATION DOSE REDUCTION: This exam was performed according to the departmental dose-optimization program which includes automated exposure control, adjustment of the mA and/or kV according to patient size and/or use of iterative reconstruction technique. COMPARISON:  04/15/2023 FINDINGS: Brain: Stable chronic small-vessel ischemic changes within the periventricular white matter. Stable encephalomalacia right cerebellar hemisphere consistent with chronic infarct. No evidence of acute infarct or hemorrhage. Lateral ventricles and remaining midline structures appear unremarkable. There are no acute extra-axial fluid collections. No mass effect. Vascular: Stable atherosclerosis.  No hyperdense vessel. Skull: Normal. Negative for fracture or focal lesion. Sinuses/Orbits: No acute finding. Other: None. IMPRESSION: 1. Stable head CT.  No acute intracranial process. Electronically Signed   By: Sharlet Salina M.D.   On: 08/15/2023 15:42   DG Chest Portable 1 View Result Date: 08/15/2023 CLINICAL DATA:  Shortness of breath and weakness beginning today. EXAM: PORTABLE CHEST 1 VIEW COMPARISON:  04/15/2023 FINDINGS: Patient is slightly rotated to the right. Lungs are  adequately inflated demonstrate opacification over the mid to lower right lung. Evidence small right effusion with fluid tracking to the right apex. Minimal left base opacification. Mild stable cardiomegaly. Remainder of the exam is unchanged. IMPRESSION: 1. Opacification over the mid to lower right lung with small right effusion. Minimal left base opacification. Suspect multifocal infection. 2. Mild stable cardiomegaly. Electronically Signed   By: Elberta Fortis M.D.   On: 08/15/2023 14:38   Family Communication: Palliative team to meet with family tomorrow for goals of care discussion.  Disposition: Status is: Inpatient Remains inpatient appropriate because: IV antibiotics, GI work up  Planned Discharge Destination: Skilled nursing facility     Time spent: 39 minutes  Author: Marcelino Duster, MD 08/16/2023 1:36 PM Secure chat 7am to 7pm For on call review [email or web address].

## 2023-08-16 NOTE — Evaluation (Signed)
Clinical/Bedside Swallow Evaluation Patient Details  Name: Shaun Mclaughlin MRN: [id number] Date of Birth: 1935-07-11  Today's Date: 08/16/2023 Time: SLP Start Time (ACUTE ONLY): 0930 SLP Stop Time (ACUTE ONLY): 0950 SLP Time Calculation (min) (ACUTE ONLY): 20 min  Past Medical History:  Past Medical History:  Diagnosis Date   Acute on chronic combined systolic and diastolic congestive heart failure, NYHA class 2 (HCC) 06/29/2014   Arthritis    "maybe a little bit in some joints" (08/04/2012)   Atherosclerotic renal artery stenosis, unilateral (HCC)    lleft renal artery stenosis by duplex ultrasound   CAD (coronary artery disease),Hx RCA stenting-patent 06/2012  08/05/2012   Chronic anticoagulation, coumadin for PAF 08/05/2012   Chronic bronchitis (HCC)    "about q yr" (08/04/2012)   DM (diabetes mellitus) (HCC) 08/05/2012   External bleeding hemorrhoids ~ 2003; 2008; 2013   "removed polyps and no bleeding since" (08/04/2012)   GERD (gastroesophageal reflux disease)    Hip fracture (HCC) 10/2016   RIGHT FEMORAL NECK    HTN (hypertension) 08/05/2012   Hypercholesteremia    Hyperlipidemia 08/05/2012   Hyperthyroidism    Kidney stone 1980's   PAF (paroxysmal atrial fibrillation), maintaining SR 08/05/2012   Pneumonia ~ 2008   PVD (peripheral vascular disease) with claudication, Lt Upper ext. pain, and known 80-90%distal Lt. subclavian artery stenosis  08/05/2012   S/P angioplasty with stent to Lt. subclavian artery -Nitrinol stent 08/04/12 08/05/2012   Type II diabetes mellitus (HCC)    "take RX; I'm prediabetic; don't have to  check my CBG qd; keep my weight down" (08/04/2012)   Past Surgical History:  Past Surgical History:  Procedure Laterality Date   CARDIAC CATHETERIZATION  2008 and Dec 2013   patent coronaries   CARDIOVERSION N/A 07/13/2014   Procedure: CARDIOVERSION;  Surgeon: Thurmon Fair, MD;  Location: [facility];  Service: Cardiovascular;  Laterality: N/A;   CARDIOVERSION N/A  09/28/2014   Procedure: CARDIOVERSION;  Surgeon: Lars Masson, MD;  Location: [facility];  Service: Cardiovascular;  Laterality: N/A;   CATARACT EXTRACTION W/ INTRAOCULAR LENS IMPLANT     "right eye" (08/04/2012)   CORONARY ANGIOPLASTY WITH STENT PLACEMENT  2003   patent 12/13   GLAUCOMA SURGERY  2000's   "right eye; had laser procedure  to lower the pressure and prevent glaucoma" (08/04/2012)   HERNIA REPAIR  2000's   "umbilical" (08/04/2012)   INGUINAL HERNIA REPAIR  2000's   "right" (08/04/2012)   KIDNEY STONE SURGERY  1980's   LEFT HEART CATHETERIZATION WITH CORONARY ANGIOGRAM N/A 06/28/2012   Procedure: LEFT HEART CATHETERIZATION WITH CORONARY ANGIOGRAM;  Surgeon: [name], MD;  Location: [facility] CATH LAB;  Service: Cardiovascular;  Laterality: N/A;   LITHOTRIPSY  1980's   "imploded in my kidney; ended up having to be cut open in my back" (08/04/2012)   SKIN CANCER EXCISION  2017 & 2018   SUBCLAVIAN STENT PLACEMENT  Jan 2014   Lt SCA   TONSILLECTOMY  1940's   TOTAL HIP ARTHROPLASTY Right 10/27/2016   Procedure: TOTAL HIP ARTHROPLASTY ANTERIOR APPROACH;  Surgeon: Samson Frederic, MD;  Location: [facility] OR;  Service: Orthopedics;  Laterality: Right;   UNILATERAL UPPER EXTREMEITY ANGIOGRAM N/A 08/04/2012   Procedure: UNILATERAL UPPER EXTREMEITY ANGIOGRAM;  Surgeon: [name], MD;  Location: [facility] CATH LAB;  Service: Cardiovascular;  Laterality: N/A;   UPPER EXTREMITY ANGIOGRAM Bilateral 06/28/2012   Procedure: UPPER EXTREMITY ANGIOGRAM;  Surgeon: [name], MD;  Location: [facility] CATH LAB;  Service:  Cardiovascular;  Laterality: Bilateral;   HPI:  Shaun Mclaughlin is a 89 y.o. male admitted with confusion and shortness of breath. Pt found to have pneumonia related to COVID-19 and possibly superimposed bacterial pna.  MD writes " He has significant infiltrates-mostly in his right lung. Suspect this is mostly aspiration-unclear if he has some COVID-19 PNA as well."  Also upper GI bleeding with  acute blood loss anemia.  Pt apparently has been residing at [facility] for the past year.   Pt with medical history significant of HTN, HLD, DM-2, CAD, persistent atrial fibrillation chronic HFpEF, CAD squamous cell carcinoma of the skin (face)-currently getting radiation .    Assessment / Plan / Recommendation  Clinical Impression  Pt demosntrates concern for aspiration with thin liquids given consistent cough following sips of water. Pt initially denied difficulty swallowing, but after coughing on water he said it is a common occurence. Will f/u with instrumental assessment of swallowing for further recommendations. At this time pt may have ice chips and meds crushed in puree. MBS today or tomorrow depending on scheduling SLP Visit Diagnosis: Dysphagia, oropharyngeal phase (R13.12)    Aspiration Risk  Severe aspiration risk    Diet Recommendation NPO except meds;Ice chips PRN after oral care    Medication Administration: Crushed with puree Supervision: Staff to assist with self feeding Compensations: Slow rate;Small sips/bites Postural Changes: Seated upright at 90 degrees    Other  Recommendations Oral Care Recommendations: Oral care BID    Recommendations for follow up therapy are one component of a multi-disciplinary discharge planning process, led by the attending physician.  Recommendations may be updated based on patient status, additional functional criteria and insurance authorization.  Follow up Recommendations        Assistance Recommended at Discharge    Functional Status Assessment    Frequency and Duration            Prognosis Prognosis for improved oropharyngeal function: Fair Barriers to Reach Goals: Severity of deficits      Swallow Study   General HPI: Shaun Mclaughlin is a 89 y.o. male admitted with confusion and shortness of breath. Pt found to have pneumonia related to COVID-19 and possibly superimposed bacterial pna.  MD writes " He has significant  infiltrates-mostly in his right lung. Suspect this is mostly aspiration-unclear if he has some COVID-19 PNA as well."  Also upper GI bleeding with acute blood loss anemia.  Pt apparently has been residing at [facility] for the past year.   Pt with medical history significant of HTN, HLD, DM-2, CAD, persistent atrial fibrillation chronic HFpEF, CAD squamous cell carcinoma of the skin (face)-currently getting radiation . Type of Study: Bedside Swallow Evaluation Diet Prior to this Study: Thin liquids (Level 0) Temperature Spikes Noted: Yes Respiratory Status: Nasal cannula History of Recent Intubation: No Behavior/Cognition: Alert;Cooperative;Other (Comment) (hard of hearing) Oral Cavity Assessment: Within Functional Limits Oral Care Completed by SLP: No Oral Cavity - Dentition: Missing dentition Vision: Functional for self-feeding Self-Feeding Abilities: Able to feed self;Needs assist Patient Positioning: Upright in bed Baseline Vocal Quality: Hoarse;Breathy Volitional Cough: Congested;Weak Volitional Swallow: Able to elicit    Oral/Motor/Sensory Function Overall Oral Motor/Sensory Function: Within functional limits   Ice Chips Ice chips: Impaired Presentation: Spoon Pharyngeal Phase Impairments: Cough - Immediate   Thin Liquid Thin Liquid: Impaired Pharyngeal  Phase Impairments: Cough - Immediate    Nectar Thick Nectar Thick Liquid: Not tested   Honey Thick Honey Thick Liquid: Not tested  Puree Puree: Impaired Presentation: Spoon Pharyngeal Phase Impairments: Throat Clearing - Delayed   Solid     Solid: Within functional limits      Javonn Gauger, Riley Nearing 08/16/2023,11:25 AM

## 2023-08-16 NOTE — Evaluation (Signed)
Occupational Therapy Evaluation Patient Details Name: Shaun Mclaughlin MRN: [id number] DOB: 13-Jul-1935 Today's Date: 08/16/2023   History of Present Illness 89 y.o. male with medical history significant of HTN, HLD, DM-2, CAD, persistent atrial fibrillation chronic HFpEF, CAD squamous cell carcinoma of the skin (face)-currently getting radiation who presented with altered mental status and shortness of breath.   Clinical Impression   Patient admitted for the diagnosis above.  PTA he resides at a local ILF with his spouse.  Patient admits to falls, states he either uses a RW or SPC for mobility.  States he is able to complete light iADL and his own ADL.  Currently patient is weak and SOB.  Patient needs generalized Min A for ED level mobility and lower body ADL.  OT will follow in the acute setting to address deficits.  OT will recommend HH OT initially, but will depend on progression.         If plan is discharge home, recommend the following: Assist for transportation;Assistance with cooking/housework;A little help with walking and/or transfers;A little help with bathing/dressing/bathroom;Supervision due to cognitive status    Functional Status Assessment  Patient has had a recent decline in their functional status and demonstrates the ability to make significant improvements in function in a reasonable and predictable amount of time.  Equipment Recommendations  None recommended by OT    Recommendations for Other Services       Precautions / Restrictions Precautions Precautions: Fall Precaution Comments: Airborn Restrictions Weight Bearing Restrictions Per Provider Order: No      Mobility Bed Mobility Overal bed mobility: Needs Assistance Bed Mobility: Supine to Sit, Sit to Supine     Supine to sit: Min assist Sit to supine: Contact guard assist     Patient Response: Cooperative  Transfers Overall transfer level: Needs assistance Equipment used: Rolling walker (2  wheels) Transfers: Sit to/from Stand, Bed to chair/wheelchair/BSC Sit to Stand: Supervision     Step pivot transfers: Min assist     General transfer comment: lines and leads in ED      Balance Overall balance assessment: Needs assistance Sitting-balance support: Feet unsupported Sitting balance-Leahy Scale: Fair     Standing balance support: Reliant on assistive device for balance Standing balance-Leahy Scale: Poor                             ADL either performed or assessed with clinical judgement   ADL       Grooming: Wash/dry hands;Wash/dry face;Supervision/safety;Sitting           Upper Body Dressing : Supervision/safety;Sitting   Lower Body Dressing: Minimal assistance;Sit to/from stand   Toilet Transfer: Minimal assistance;Stand-pivot                   Vision Patient Visual Report: No change from baseline       Perception Perception: Not tested       Praxis Praxis: Not tested       Pertinent Vitals/Pain Pain Assessment Pain Assessment: No/denies pain     Extremity/Trunk Assessment Upper Extremity Assessment Upper Extremity Assessment: Overall WFL for tasks assessed   Lower Extremity Assessment Lower Extremity Assessment: Defer to PT evaluation   Cervical / Trunk Assessment Cervical / Trunk Assessment: Normal   Communication Communication Communication: Hearing impairment   Cognition Arousal: Alert Behavior During Therapy: Anxious Overall Cognitive Status: No family/caregiver present to determine baseline cognitive functioning  General Comments: Oriented to self and place, cues for situation.  Following commands.  Admitted for AMS     General Comments       Exercises     Shoulder Instructions      Home Living Family/patient expects to be discharged to:: Other (Comment)                                 Additional Comments: [facility]       Prior Functioning/Environment Prior Level of Function : Independent/Modified Independent             Mobility Comments: ambulates with UE support of SPC and use of railing in hallway or use of RW.  History of falls ADLs Comments: independent per patient        OT Problem List: Decreased strength;Decreased activity tolerance;Impaired balance (sitting and/or standing);Decreased safety awareness      OT Treatment/Interventions: Self-care/ADL training;Therapeutic activities;Patient/family education;Balance training;DME and/or AE instruction;Energy conservation    OT Goals(Current goals can be found in the care plan section) Acute Rehab OT Goals Patient Stated Goal: Get some water OT Goal Formulation: With patient Time For Goal Achievement: 08/30/23 Potential to Achieve Goals: Good ADL Goals Pt Will Perform Grooming: with modified independence;standing Pt Will Perform Lower Body Dressing: with modified independence;sit to/from stand Pt Will Transfer to Toilet: with modified independence;ambulating;regular height toilet  OT Frequency: Min 1X/week    Co-evaluation              AM-PAC OT "6 Clicks" Daily Activity     Outcome Measure Help from another person eating meals?: None Help from another person taking care of personal grooming?: None Help from another person toileting, which includes using toliet, bedpan, or urinal?: A Little Help from another person bathing (including washing, rinsing, drying)?: A Little Help from another person to put on and taking off regular upper body clothing?: A Little Help from another person to put on and taking off regular lower body clothing?: A Little 6 Click Score: 20   End of Session Equipment Utilized During Treatment: Rolling walker (2 wheels);Oxygen Nurse Communication: Mobility status  Activity Tolerance: Patient limited by fatigue Patient left: in bed;with call bell/phone within reach  OT Visit Diagnosis: Unsteadiness on feet  (R26.81);History of falling (Z91.81);Other symptoms and signs involving cognitive function                Time: 0981-1914 OT Time Calculation (min): 25 min Charges:  OT General Charges $OT Visit: 1 Visit OT Evaluation $OT Eval Moderate Complexity: 1 Mod OT Treatments $Self Care/Home Management : 8-22 mins  08/16/2023  RP, OTR/L  Acute Rehabilitation Services  Office:  [phone]   Suzanna Obey 08/16/2023, 8:57 AM

## 2023-08-16 NOTE — ED Notes (Signed)
ED TO INPATIENT HANDOFF REPORT  ED Nurse Name and Phone #: Topher [phone]  S Name/Age/Gender Shaun Mclaughlin 89 y.o. male Room/Bed: 043C/043C  Code Status   Code Status: Limited: Do not attempt resuscitation (DNR) -DNR-LIMITED -Do Not Intubate/DNI   Home/SNF/Other Skilled nursing facility Patient oriented to: self, place, and situation Is this baseline? Yes   Triage Complete: Triage complete  Chief Complaint Acute metabolic encephalopathy [G93.41]  Triage Note Pt BIB EMS for AMS and SHOB that started today. Tachypneic, pale, weak, and lethargic. Pt has skin cancer and is being treated with radiation and family states pt progressive weak and altered since radiation. Axox3.    Allergies Allergies  Allergen Reactions   Azithromycin Dermatitis   Cefuroxime Other (See Comments)    Abdominal pain    Level of Care/Admitting Diagnosis ED Disposition     ED Disposition  Admit   Condition  --   Comment  Hospital Area: [facility] [[id number]]  Level of Care: Progressive [102]  Admit to Progressive based on following criteria: GI, ENDOCRINE disease patients with GI bleeding, acute liver failure or pancreatitis, stable with diabetic ketoacidosis or thyrotoxicosis (hypothyroid) state.  May admit patient to [facility] or [facility] if equivalent level of care is available:: No  Covid Evaluation: Confirmed COVID Positive  Diagnosis: Acute metabolic encephalopathy [[id number]]  Admitting Physician: Maretta Bees [[id number]]  Attending Physician: Maretta Bees [[id number]]  Certification:: I certify this patient will need inpatient services for at least 2 midnights  Expected Medical Readiness: 08/17/2023          B Medical/Surgery History Past Medical History:  Diagnosis Date   Acute on chronic combined systolic and diastolic congestive heart failure, NYHA class 2 (HCC) 06/29/2014   Arthritis    "maybe a little bit in some joints" (08/04/2012)   Atherosclerotic  renal artery stenosis, unilateral (HCC)    lleft renal artery stenosis by duplex ultrasound   CAD (coronary artery disease),Hx RCA stenting-patent 06/2012  08/05/2012   Chronic anticoagulation, coumadin for PAF 08/05/2012   Chronic bronchitis (HCC)    "about q yr" (08/04/2012)   DM (diabetes mellitus) (HCC) 08/05/2012   External bleeding hemorrhoids ~ 2003; 2008; 2013   "removed polyps and no bleeding since" (08/04/2012)   GERD (gastroesophageal reflux disease)    Hip fracture (HCC) 10/2016   RIGHT FEMORAL NECK    HTN (hypertension) 08/05/2012   Hypercholesteremia    Hyperlipidemia 08/05/2012   Hyperthyroidism    Kidney stone 1980's   PAF (paroxysmal atrial fibrillation), maintaining SR 08/05/2012   Pneumonia ~ 2008   PVD (peripheral vascular disease) with claudication, Lt Upper ext. pain, and known 80-90%distal Lt. subclavian artery stenosis  08/05/2012   S/P angioplasty with stent to Lt. subclavian artery -Nitrinol stent 08/04/12 08/05/2012   Type II diabetes mellitus (HCC)    "take RX; I'm prediabetic; don't have to  check my CBG qd; keep my weight down" (08/04/2012)   Past Surgical History:  Procedure Laterality Date   CARDIAC CATHETERIZATION  2008 and Dec 2013   patent coronaries   CARDIOVERSION N/A 07/13/2014   Procedure: CARDIOVERSION;  Surgeon: Thurmon Fair, MD;  Location: [facility] ENDOSCOPY;  Service: Cardiovascular;  Laterality: N/A;   CARDIOVERSION N/A 09/28/2014   Procedure: CARDIOVERSION;  Surgeon: Lars Masson, MD;  Location: [facility] ENDOSCOPY;  Service: Cardiovascular;  Laterality: N/A;   CATARACT EXTRACTION W/ INTRAOCULAR LENS IMPLANT     "right eye" (08/04/2012)   CORONARY ANGIOPLASTY WITH STENT PLACEMENT  2003   patent 12/13   GLAUCOMA SURGERY  2000's   "right eye; had laser procedure  to lower the pressure and prevent glaucoma" (08/04/2012)   HERNIA REPAIR  2000's   "umbilical" (08/04/2012)   INGUINAL HERNIA REPAIR  2000's   "right" (08/04/2012)   KIDNEY STONE SURGERY  1980's   LEFT  HEART CATHETERIZATION WITH CORONARY ANGIOGRAM N/A 06/28/2012   Procedure: LEFT HEART CATHETERIZATION WITH CORONARY ANGIOGRAM;  Surgeon: [name], MD;  Location: [facility] CATH LAB;  Service: Cardiovascular;  Laterality: N/A;   LITHOTRIPSY  1980's   "imploded in my kidney; ended up having to be cut open in my back" (08/04/2012)   SKIN CANCER EXCISION  2017 & 2018   SUBCLAVIAN STENT PLACEMENT  Jan 2014   Lt SCA   TONSILLECTOMY  1940's   TOTAL HIP ARTHROPLASTY Right 10/27/2016   Procedure: TOTAL HIP ARTHROPLASTY ANTERIOR APPROACH;  Surgeon: Samson Frederic, MD;  Location: [facility] OR;  Service: Orthopedics;  Laterality: Right;   UNILATERAL UPPER EXTREMEITY ANGIOGRAM N/A 08/04/2012   Procedure: UNILATERAL UPPER EXTREMEITY ANGIOGRAM;  Surgeon: [name], MD;  Location: [facility] CATH LAB;  Service: Cardiovascular;  Laterality: N/A;   UPPER EXTREMITY ANGIOGRAM Bilateral 06/28/2012   Procedure: UPPER EXTREMITY ANGIOGRAM;  Surgeon: [name], MD;  Location: [facility] CATH LAB;  Service: Cardiovascular;  Laterality: Bilateral;     A IV Location/Drains/Wounds Patient Lines/Drains/Airways Status     Active Line/Drains/Airways     Name Placement date Placement time Site Days   Peripheral IV 08/15/23 20 G 1" Left Forearm 08/15/23  2040  Forearm  1   Wound / Incision (Open or Dehisced) 04/16/23 Skin tear Arm Left;Posterior 04/16/23  1239  Arm  122   Wound / Incision (Open or Dehisced) Skin tear Elbow Left;Lower --  --  Elbow  --   Wound / Incision (Open or Dehisced) 08/15/23 Skin tear Arm Left;Lower;Posterior 08/15/23  2230  Arm  1            Intake/Output Last 24 hours  Intake/Output Summary (Last 24 hours) at 08/16/2023 1505 Last data filed at 08/16/2023 0024 Gross per 24 hour  Intake 559.58 ml  Output --  Net 559.58 ml    Labs/Imaging Results for orders placed or performed during the hospital encounter of 08/15/23 (from the past 48 hours)  CBC with Differential     Status: Abnormal   Collection  Time: 08/15/23  2:08 PM  Result Value Ref Range   WBC 7.0 4.0 - 10.5 K/uL   RBC 2.83 (L) 4.22 - 5.81 MIL/uL   Hemoglobin 7.4 (L) 13.0 - 17.0 g/dL   HCT 78.2 (L) 95.6 - 21.3 %   MCV 87.3 80.0 - 100.0 fL   MCH 26.1 26.0 - 34.0 pg   MCHC 30.0 30.0 - 36.0 g/dL   RDW 08.6 (H) 57.8 - 46.9 %   Platelets 247 150 - 400 K/uL   nRBC 3.1 (H) 0.0 - 0.2 %   Neutrophils Relative % 92 %   Neutro Abs 6.4 1.7 - 7.7 K/uL   Lymphocytes Relative 3 %   Lymphs Abs 0.2 (L) 0.7 - 4.0 K/uL   Monocytes Relative 5 %   Monocytes Absolute 0.4 0.1 - 1.0 K/uL   Eosinophils Relative 0 %   Eosinophils Absolute 0.0 0.0 - 0.5 K/uL   Basophils Relative 0 %   Basophils Absolute 0.0 0.0 - 0.1 K/uL   WBC Morphology See Note     Comment: Vaculated Neutrophils  Smear Review See Note     Comment: Normal Platelet Morphology   nRBC 11 (H) 0 /100 WBC   Abs Immature Granulocytes 0.00 0.00 - 0.07 K/uL   Polychromasia PRESENT     Comment: Performed at [facility], [address] [place], [place] [address]  Comprehensive metabolic panel     Status: Abnormal   Collection Time: 08/15/23  2:08 PM  Result Value Ref Range   Sodium 138 135 - 145 mmol/L   Potassium 3.1 (L) 3.5 - 5.1 mmol/L   Chloride 99 98 - 111 mmol/L   CO2 27 22 - 32 mmol/L   Glucose, Bld 152 (H) 70 - 99 mg/dL    Comment: Glucose reference range applies only to samples taken after fasting for at least 8 hours.   BUN 78 (H) 8 - 23 mg/dL   Creatinine, Ser 6.04 (H) 0.61 - 1.24 mg/dL   Calcium 9.7 8.9 - 54.0 mg/dL   Total Protein 5.5 (L) 6.5 - 8.1 g/dL   Albumin 2.0 (L) 3.5 - 5.0 g/dL   AST 18 15 - 41 U/L   ALT 13 0 - 44 U/L   Alkaline Phosphatase 63 38 - 126 U/L   Total Bilirubin 1.3 (H) 0.0 - 1.2 mg/dL   GFR, Estimated 34 (L) >60 mL/min    Comment: (NOTE) Calculated using the CKD-EPI Creatinine Equation (2021)    Anion gap 12 5 - 15    Comment: Performed at [facility], [address], [place], [place] [address]  Brain natriuretic  peptide     Status: Abnormal   Collection Time: 08/15/23  2:08 PM  Result Value Ref Range   B Natriuretic Peptide 1,284.9 (H) 0.0 - 100.0 pg/mL    Comment: Performed at [facility], [address], [place], [place] [address]  Troponin I (High Sensitivity)     Status: Abnormal   Collection Time: 08/15/23  2:08 PM  Result Value Ref Range   Troponin I (High Sensitivity) 60 (H) <18 ng/L    Comment: (NOTE) Elevated high sensitivity troponin I (hsTnI) values and significant  changes across serial measurements may suggest ACS but many other  chronic and acute conditions are known to elevate hsTnI results.  Refer to the "Links" section for chest pain algorithms and additional  guidance. Performed at [facility], [address] [place], [place] [address]   POC CBG, ED     Status: Abnormal   Collection Time: 08/15/23  2:10 PM  Result Value Ref Range   Glucose-Capillary 140 (H) 70 - 99 mg/dL    Comment: Glucose reference range applies only to samples taken after fasting for at least 8 hours.  Resp panel by RT-PCR (RSV, Flu A&B, Covid) Anterior Nasal Swab     Status: Abnormal   Collection Time: 08/15/23  2:11 PM   Specimen: Anterior Nasal Swab  Result Value Ref Range   SARS Coronavirus 2 by RT PCR POSITIVE (A) NEGATIVE   Influenza A by PCR NEGATIVE NEGATIVE   Influenza B by PCR NEGATIVE NEGATIVE    Comment: (NOTE) The Xpert Xpress SARS-CoV-2/FLU/RSV plus assay is intended as an aid in the diagnosis of influenza from Nasopharyngeal swab specimens and should not be used as a sole basis for treatment. Nasal washings and aspirates are unacceptable for Xpert Xpress SARS-CoV-2/FLU/RSV testing.  Fact Sheet for Patients: BloggerCourse.com  Fact Sheet for Healthcare Providers: SeriousBroker.it  This test is not yet approved or cleared by the  [place] [employer] and has been authorized for detection and/or diagnosis of SARS-CoV-2  by FDA under an TEFL teacher (EUA). This EUA will remain in effect (meaning this test can be used) for the duration of the COVID-19 declaration under Section 564(b)(1) of the Act, 21 U.S.C. section 360bbb-3(b)(1), unless the authorization is terminated or revoked.     Resp Syncytial Virus by PCR NEGATIVE NEGATIVE    Comment: (NOTE) Fact Sheet for Patients: BloggerCourse.com  Fact Sheet for Healthcare Providers: SeriousBroker.it  This test is not yet approved or cleared by the [place] FDA and has been authorized for detection and/or diagnosis of SARS-CoV-2 by FDA under an Emergency Use Authorization (EUA). This EUA will remain in effect (meaning this test can be used) for the duration of the COVID-19 declaration under Section 564(b)(1) of the Act, 21 U.S.C. section 360bbb-3(b)(1), unless the authorization is terminated or revoked.  Performed at [facility], [address] [place], [place] [address]   Troponin I (High Sensitivity)     Status: Abnormal   Collection Time: 08/15/23  3:17 PM  Result Value Ref Range   Troponin I (High Sensitivity) 56 (H) <18 ng/L    Comment: (NOTE) Elevated high sensitivity troponin I (hsTnI) values and significant  changes across serial measurements may suggest ACS but many other  chronic and acute conditions are known to elevate hsTnI results.  Refer to the "Links" section for chest pain algorithms and additional  guidance. Performed at [facility], [address] [place], [place] [address]   Magnesium     Status: Abnormal   Collection Time: 08/15/23  3:17 PM  Result Value Ref Range   Magnesium 1.5 (L) 1.7 - 2.4 mg/dL    Comment: Performed at [facility], [address], [place], [place] [address]  Procalcitonin     Status: None   Collection Time: 08/15/23  3:17 PM  Result Value Ref Range   Procalcitonin 0.63 ng/mL    Comment:         Interpretation: PCT > 0.5 ng/mL and <= 2 ng/mL: Systemic infection (sepsis) is possible, but other conditions are known to elevate PCT as well. (NOTE)       Sepsis PCT Algorithm           Lower Respiratory Tract                                      Infection PCT Algorithm    ----------------------------     ----------------------------         PCT < 0.25 ng/mL                PCT < 0.10 ng/mL          Strongly encourage             Strongly discourage   discontinuation of antibiotics    initiation of antibiotics    ----------------------------     -----------------------------       PCT 0.25 - 0.50 ng/mL            PCT 0.10 - 0.25 ng/mL               OR       >80% decrease in PCT            Discourage initiation of  antibiotics      Encourage discontinuation           of antibiotics    ----------------------------     -----------------------------         PCT >= 0.50 ng/mL              PCT 0.26 - 0.50 ng/mL                AND       <80% decrease in PCT             Encourage initiation of                                             antibiotics       Encourage continuation           of antibiotics    ----------------------------     -----------------------------        PCT >= 0.50 ng/mL                  PCT > 0.50 ng/mL               AND         increase in PCT                  Strongly encourage                                      initiation of antibiotics    Strongly encourage escalation           of antibiotics                                     -----------------------------                                           PCT <= 0.25 ng/mL                                                 OR                                        > 80% decrease in PCT                                      Discontinue / Do not initiate                                             antibiotics  Performed at [facility], [address]  [place], [place] [address]   POC occult blood, ED Provider will collect  Status: Abnormal   Collection Time: 08/15/23  3:21 PM  Result Value Ref Range   Fecal Occult Bld POSITIVE (A) NEGATIVE  Prepare RBC (crossmatch)     Status: None   Collection Time: 08/15/23  4:54 PM  Result Value Ref Range   Order Confirmation      ORDER PROCESSED BY BLOOD BANK Performed at [facility], [address], [place], [place] [address]   Type and screen [facility]     Status: None   Collection Time: 08/15/23  5:28 PM  Result Value Ref Range   ABO/RH(D) A POS    Antibody Screen NEG    Sample Expiration 08/18/2023,2359    Unit Number [id number]    Blood Component Type RED CELLS,LR    Unit division 00    Status of Unit ISSUED,FINAL    Transfusion Status OK TO TRANSFUSE    Crossmatch Result      Compatible Performed at [facility], [address] [place], [place] [address]   Culture, blood (Routine X 2) w Reflex to ID Panel     Status: None (Preliminary result)   Collection Time: 08/15/23  6:36 PM   Specimen: BLOOD  Result Value Ref Range   Specimen Description BLOOD RIGHT ANTECUBITAL    Special Requests      BOTTLES DRAWN AEROBIC AND ANAEROBIC Blood Culture results may not be optimal due to an inadequate volume of blood received in culture bottles   Culture      NO GROWTH < 24 HOURS Performed at [facility], [address], [place], [place] [address]    Report Status PENDING   Culture, blood (Routine X 2) w Reflex to ID Panel     Status: None (Preliminary result)   Collection Time: 08/15/23  6:41 PM   Specimen: BLOOD  Result Value Ref Range   Specimen Description BLOOD BLOOD RIGHT HAND    Special Requests      AEROBIC BOTTLE ONLY Blood Culture results may not be optimal due to an inadequate volume of blood received in culture bottles   Culture      NO GROWTH < 24 HOURS Performed at [facility], [address], [place], [place] [address]     Report Status PENDING   C-reactive protein     Status: Abnormal   Collection Time: 08/16/23  5:34 AM  Result Value Ref Range   CRP 18.0 (H) <1.0 mg/dL    Comment: Performed at [facility], [address], [place], [place] [address]  CBC     Status: Abnormal   Collection Time: 08/16/23  5:34 AM  Result Value Ref Range   WBC 11.5 (H) 4.0 - 10.5 K/uL   RBC 3.41 (L) 4.22 - 5.81 MIL/uL   Hemoglobin 9.2 (L) 13.0 - 17.0 g/dL   HCT 95.1 (L) 88.4 - 16.6 %   MCV 87.1 80.0 - 100.0 fL   MCH 27.0 26.0 - 34.0 pg   MCHC 31.0 30.0 - 36.0 g/dL   RDW 06.3 (H) 01.6 - 01.0 %   Platelets 206 150 - 400 K/uL   nRBC 1.6 (H) 0.0 - 0.2 %    Comment: Performed at [facility], [address], [place], [place] [address]  Comprehensive metabolic panel     Status: Abnormal   Collection Time: 08/16/23  5:34 AM  Result Value Ref Range   Sodium 138 135 - 145 mmol/L   Potassium  3.8 3.5 - 5.1 mmol/L   Chloride 101 98 - 111 mmol/L   CO2 26 22 - 32 mmol/L   Glucose, Bld 190 (H) 70 - 99 mg/dL    Comment: Glucose reference range applies only to samples taken after fasting for at least 8 hours.   BUN 80 (H) 8 - 23 mg/dL   Creatinine, Ser 9.60 (H) 0.61 - 1.24 mg/dL   Calcium 9.5 8.9 - 45.4 mg/dL   Total Protein 5.9 (L) 6.5 - 8.1 g/dL   Albumin 2.0 (L) 3.5 - 5.0 g/dL   AST 20 15 - 41 U/L   ALT 16 0 - 44 U/L   Alkaline Phosphatase 64 38 - 126 U/L   Total Bilirubin 1.8 (H) 0.0 - 1.2 mg/dL   GFR, Estimated 29 (L) >60 mL/min    Comment: (NOTE) Calculated using the CKD-EPI Creatinine Equation (2021)    Anion gap 11 5 - 15    Comment: Performed at [facility], [address] [place], [place] [address]  Procalcitonin     Status: None   Collection Time: 08/16/23  5:34 AM  Result Value Ref Range   Procalcitonin 1.53 ng/mL    Comment:        Interpretation: PCT > 0.5 ng/mL and <= 2 ng/mL: Systemic infection (sepsis) is possible, but other conditions are known to elevate PCT as well. (NOTE)        Sepsis PCT Algorithm           Lower Respiratory Tract                                      Infection PCT Algorithm    ----------------------------     ----------------------------         PCT < 0.25 ng/mL                PCT < 0.10 ng/mL          Strongly encourage             Strongly discourage   discontinuation of antibiotics    initiation of antibiotics    ----------------------------     -----------------------------       PCT 0.25 - 0.50 ng/mL            PCT 0.10 - 0.25 ng/mL               OR       >80% decrease in PCT            Discourage initiation of                                            antibiotics      Encourage discontinuation           of antibiotics    ----------------------------     -----------------------------         PCT >= 0.50 ng/mL              PCT 0.26 - 0.50 ng/mL                AND       <80% decrease in PCT             Encourage initiation of  antibiotics       Encourage continuation           of antibiotics    ----------------------------     -----------------------------        PCT >= 0.50 ng/mL                  PCT > 0.50 ng/mL               AND         increase in PCT                  Strongly encourage                                      initiation of antibiotics    Strongly encourage escalation           of antibiotics                                     -----------------------------                                           PCT <= 0.25 ng/mL                                                 OR                                        > 80% decrease in PCT                                      Discontinue / Do not initiate                                             antibiotics  Performed at [facility], [address], [place], [place] [address]   TSH     Status: None   Collection Time: 08/16/23  5:34 AM  Result Value Ref Range   TSH 1.499 0.350 - 4.500 uIU/mL    Comment: Performed by a  3rd Generation assay with a functional sensitivity of <=0.01 uIU/mL. Performed at [facility], [address], [place], [place] [address]   T4, free     Status: None   Collection Time: 08/16/23  5:34 AM  Result Value Ref Range   Free T4 0.94 0.61 - 1.12 ng/dL    Comment: (NOTE) Biotin ingestion may interfere with free T4 tests. If the results are inconsistent with the TSH level, previous test results, or the clinical presentation, then consider biotin interference. If needed, order repeat testing after stopping biotin. Performed at [facility], [address], [place], [place] [address]   Magnesium     Status: None  Collection Time: 08/16/23  5:34 AM  Result Value Ref Range   Magnesium 2.4 1.7 - 2.4 mg/dL    Comment: Performed at [facility], [address], [place], [place] [address]   CT Head Wo Contrast Result Date: 08/15/2023 CLINICAL DATA:  Altered level of consciousness EXAM: CT HEAD WITHOUT CONTRAST TECHNIQUE: Contiguous axial images were obtained from the base of the skull through the vertex without intravenous contrast. RADIATION DOSE REDUCTION: This exam was performed according to the departmental dose-optimization program which includes automated exposure control, adjustment of the mA and/or kV according to patient size and/or use of iterative reconstruction technique. COMPARISON:  04/15/2023 FINDINGS: Brain: Stable chronic small-vessel ischemic changes within the periventricular white matter. Stable encephalomalacia right cerebellar hemisphere consistent with chronic infarct. No evidence of acute infarct or hemorrhage. Lateral ventricles and remaining midline structures appear unremarkable. There are no acute extra-axial fluid collections. No mass effect. Vascular: Stable atherosclerosis.  No hyperdense vessel. Skull: Normal. Negative for fracture or focal lesion. Sinuses/Orbits: No acute finding. Other: None. IMPRESSION: 1. Stable head CT.  No acute  intracranial process. Electronically Signed   By: Sharlet Salina M.D.   On: 08/15/2023 15:42   DG Chest Portable 1 View Result Date: 08/15/2023 CLINICAL DATA:  Shortness of breath and weakness beginning today. EXAM: PORTABLE CHEST 1 VIEW COMPARISON:  04/15/2023 FINDINGS: Patient is slightly rotated to the right. Lungs are adequately inflated demonstrate opacification over the mid to lower right lung. Evidence small right effusion with fluid tracking to the right apex. Minimal left base opacification. Mild stable cardiomegaly. Remainder of the exam is unchanged. IMPRESSION: 1. Opacification over the mid to lower right lung with small right effusion. Minimal left base opacification. Suspect multifocal infection. 2. Mild stable cardiomegaly. Electronically Signed   By: Elberta Fortis M.D.   On: 08/15/2023 14:38    Pending Labs Unresulted Labs (From admission, onward)     Start     Ordered   08/16/23 0937  Hemoglobin and hematocrit, blood  Now then every 8 hours,   R (with TIMED occurrences)     Comments: Not need the now order to start 8 hours after last hemoglobin, call MD for hemoglobin 7 or less    08/16/23 0937   08/15/23 1836  Expectorated Sputum Assessment w Gram Stain, Rflx to Resp Cult  Once,   R        08/15/23 1836   08/15/23 1836  Legionella Pneumophila Serogp 1 Ur Ag  Once,   R        08/15/23 1836   08/15/23 1836  Strep pneumoniae urinary antigen  Once,   R        08/15/23 1836   08/15/23 1327  Urinalysis, Routine w reflex microscopic -Urine, Clean Catch  Once,   URGENT       Question:  Specimen Source  Answer:  Urine, Clean Catch   08/15/23 1326            Vitals/Pain Today's Vitals   08/16/23 0947 08/16/23 1043 08/16/23 1300 08/16/23 1445  BP: 132/64  (!) 119/104 122/76  Pulse: (!) 134  81   Resp: 17     Temp:  (!) 96.9 F (36.1 C) (!) 96.1 F (35.6 C)   TempSrc:  Oral Axillary   SpO2: 99%  95%   Weight:      Height:      PainSc:        Isolation  Precautions No active isolations  Medications  Medications  atorvastatin (LIPITOR) tablet 40 mg (40 mg Oral Given 08/16/23 1037)  metoprolol tartrate (LOPRESSOR) tablet 25 mg (25 mg Oral Given 08/16/23 1037)  sodium chloride flush (NS) 0.9 % injection 3 mL (3 mLs Intravenous Given 08/16/23 1043)  sodium chloride flush (NS) 0.9 % injection 3 mL (3 mLs Intravenous Given 08/16/23 0744)  0.9 %  sodium chloride infusion (has no administration in time range)  acetaminophen (TYLENOL) tablet 650 mg (has no administration in time range)    Or  acetaminophen (TYLENOL) suppository 650 mg (has no administration in time range)  polyethylene glycol (MIRALAX / GLYCOLAX) packet 17 g (has no administration in time range)  ondansetron (ZOFRAN) tablet 4 mg (has no administration in time range)    Or  ondansetron (ZOFRAN) injection 4 mg (has no administration in time range)  guaiFENesin (MUCINEX) 12 hr tablet 600 mg (600 mg Oral Given 08/16/23 1246)  doxycycline (VIBRA-TABS) tablet 100 mg (100 mg Oral Given 08/16/23 1037)  propylthiouracil (PTU) tablet 50 mg (50 mg Oral Not Given 08/16/23 1258)  budesonide (PULMICORT) nebulizer solution 0.25 mg (0.25 mg Nebulization Given 08/16/23 0745)  albuterol (PROVENTIL) (2.5 MG/3ML) 0.083% nebulizer solution 2.5 mg (2.5 mg Nebulization Given 08/16/23 1045)  ipratropium (ATROVENT) nebulizer solution 0.5 mg (0.5 mg Nebulization Given 08/16/23 1045)  arformoterol (BROVANA) nebulizer solution 15 mcg (15 mcg Nebulization Given 08/16/23 0746)  Ampicillin-Sulbactam (UNASYN) 3 g in sodium chloride 0.9 % 100 mL IVPB (0 g Intravenous Stopped 08/16/23 1116)  pantoprazole (PROTONIX) injection 40 mg ( Intravenous Canceled Entry 08/15/23 1939)    Followed by  pantoprazole (PROTONIX) injection 40 mg (40 mg Intravenous Given 08/16/23 1246)    Followed by  pantoprazole (PROTONIX) injection 40 mg (has no administration in time range)  predniSONE (DELTASONE) tablet 40 mg (has no administration in  time range)  cefTRIAXone (ROCEPHIN) 1 g in sodium chloride 0.9 % 100 mL IVPB (0 g Intravenous Stopped 08/15/23 1600)  doxycycline (VIBRA-TABS) tablet 100 mg (100 mg Oral Given 08/15/23 1517)  pantoprazole (PROTONIX) injection 40 mg (40 mg Intravenous Given 08/15/23 1631)  potassium chloride SA (KLOR-CON M) CR tablet 40 mEq (40 mEq Oral Given 08/15/23 1632)  furosemide (LASIX) injection 40 mg (40 mg Intravenous Given 08/15/23 1726)  0.9 %  sodium chloride infusion (Manually program via Guardrails IV Fluids) ( Intravenous New Bag/Given 08/15/23 2100)  magnesium sulfate IVPB 4 g 100 mL (0 g Intravenous Stopped 08/15/23 2030)  pantoprazole (PROTONIX) injection 80 mg (80 mg Intravenous Given 08/15/23 2059)    Mobility non-ambulatory     Focused Assessments Pulmonary Assessment Handoff:  Lung sounds: Bilateral Breath Sounds: Diminished O2 Device: Room Air      R Recommendations: See Admitting Provider Note  Report given to:   Additional Notes:

## 2023-08-16 NOTE — ED Notes (Signed)
edh     

## 2023-08-16 NOTE — ED Notes (Signed)
PT transported to fluoroscopy

## 2023-08-16 NOTE — Progress Notes (Signed)
PT Cancellation Note  Patient Details Name: AMAURION FEITH MRN: [id number] DOB: June 02, 1935   Cancelled Treatment:    Reason Eval/Treat Not Completed: Patient at procedure or test/unavailable  Patient being moved from ED to 5w06. Will attempt 08/17/23.    Jerolyn Center, PT Acute Rehabilitation Services  Office [phone]   Zena Amos 08/16/2023, 4:02 PM

## 2023-08-16 NOTE — Consult Note (Addendum)
Consultation  Referring Provider: ER MD/Bowie PA-C Primary Care Physician:  Tally Joe, MD Primary Gastroenterologist: None/unassigned  Reason for Consultation: Anemia, dark heme positive stool  HPI: Shaun Mclaughlin is a 89 y.o. male, nursing home resident at [facility] over the past 1 year with multiple comorbidities including hypertension, diabetes mellitus, coronary artery disease, atrial fibrillation, congestive heart failure, COPD, chronic kidney disease and history of squamous cell CA of the skin for which she is undergoing radiation on his face. Brought to the emergency room with concerns for altered mental status and progressive shortness of breath.  Much of this history is taken from the previous notes and history per his family describe 1 to 2-week history of increased fatigue/lethargy and intermittent confusion.  He has had a cough which has become productive.  Had also noted dark stools over the past 1 week in setting of chronic Eliquis. No prior history of GI bleeding, he has not had any endoscopic evaluation here locally.  Workup in the emergency room last p.m. with chest x-ray showing a multifocal pneumonia right greater than left. He was started on Unasyn and doxycycline and steroids. Respiratory panel positive for COVID-19. Sputum cultures are pending, blood culture pending CRP 18 WBC of 7/hemoglobin 7.4/macro 24.7. Was transfused 1 unit of packed RBCs and today hemoglobin 9.2/hematocrit 29.7 BUN 80/creatinine 2.14  Last dose Eliquis yesterday.  No Abdominal imaging done. CT yesterday stable no acute intracranial process  Patient is alert, hard of hearing but trying to answer questions appropriately this morning.  He says his breathing is "okay".  He endorses mild abdominal discomfort but is unable to be specific, no recent nausea or vomiting, no heartburn or indigestion.  He is aware that he has been having dark stools but is uncertain as to the duration the dark  stools have been present.  No aspirin or NSAID use.     Past Medical History:  Diagnosis Date   Acute on chronic combined systolic and diastolic congestive heart failure, NYHA class 2 (HCC) 06/29/2014   Arthritis    "maybe a little bit in some joints" (08/04/2012)   Atherosclerotic renal artery stenosis, unilateral (HCC)    lleft renal artery stenosis by duplex ultrasound   CAD (coronary artery disease),Hx RCA stenting-patent 06/2012  08/05/2012   Chronic anticoagulation, coumadin for PAF 08/05/2012   Chronic bronchitis (HCC)    "about q yr" (08/04/2012)   DM (diabetes mellitus) (HCC) 08/05/2012   External bleeding hemorrhoids ~ 2003; 2008; 2013   "removed polyps and no bleeding since" (08/04/2012)   GERD (gastroesophageal reflux disease)    Hip fracture (HCC) 10/2016   RIGHT FEMORAL NECK    HTN (hypertension) 08/05/2012   Hypercholesteremia    Hyperlipidemia 08/05/2012   Hyperthyroidism    Kidney stone 1980's   PAF (paroxysmal atrial fibrillation), maintaining SR 08/05/2012   Pneumonia ~ 2008   PVD (peripheral vascular disease) with claudication, Lt Upper ext. pain, and known 80-90%distal Lt. subclavian artery stenosis  08/05/2012   S/P angioplasty with stent to Lt. subclavian artery -Nitrinol stent 08/04/12 08/05/2012   Type II diabetes mellitus (HCC)    "take RX; I'm prediabetic; don't have to  check my CBG qd; keep my weight down" (08/04/2012)    Past Surgical History:  Procedure Laterality Date   CARDIAC CATHETERIZATION  2008 and Dec 2013   patent coronaries   CARDIOVERSION N/A 07/13/2014   Procedure: CARDIOVERSION;  Surgeon: Thurmon Fair, MD;  Location: [facility] ENDOSCOPY;  Service: Cardiovascular;  Laterality: N/A;   CARDIOVERSION N/A 09/28/2014   Procedure: CARDIOVERSION;  Surgeon: Lars Masson, MD;  Location[facility] ENDOSCOPY;  Service: Cardiovascular;  Laterality: N/A;   CATARACT EXTRACTION W/ INTRAOCULAR LENS IMPLANT     "right eye" (08/04/2012)   CORONARY ANGIOPLASTY WITH STENT  PLACEMENT  2003   patent 12/13   GLAUCOMA SURGERY  2000's   "right eye; had laser procedure  to lower the pressure and prevent glaucoma" (08/04/2012)   HERNIA REPAIR  2000's   "umbilical" (08/04/2012)   INGUINAL HERNIA REPAIR  2000's   "right" (08/04/2012)   KIDNEY STONE SURGERY  1980's   LEFT HEART CATHETERIZATION WITH CORONARY ANGIOGRAM N/A 06/28/2012   Procedure: LEFT HEART CATHETERIZATION WITH CORONARY ANGIOGRAM;  Surgeon: [name], MD;  Location: [facility] CATH LAB;  Service: Cardiovascular;  Laterality: N/A;   LITHOTRIPSY  1980's   "imploded in my kidney; ended up having to be cut open in my back" (08/04/2012)   SKIN CANCER EXCISION  2017 & 2018   SUBCLAVIAN STENT PLACEMENT  Jan 2014   Lt SCA   TONSILLECTOMY  1940's   TOTAL HIP ARTHROPLASTY Right 10/27/2016   Procedure: TOTAL HIP ARTHROPLASTY ANTERIOR APPROACH;  Surgeon: Samson Frederic, MD;  Location: [facility] OR;  Service: Orthopedics;  Laterality: Right;   UNILATERAL UPPER EXTREMEITY ANGIOGRAM N/A 08/04/2012   Procedure: UNILATERAL UPPER EXTREMEITY ANGIOGRAM;  Surgeon: [name], MD;  Location: [facility] CATH LAB;  Service: Cardiovascular;  Laterality: N/A;   UPPER EXTREMITY ANGIOGRAM Bilateral 06/28/2012   Procedure: UPPER EXTREMITY ANGIOGRAM;  Surgeon: [name], MD;  Location: [facility] CATH LAB;  Service: Cardiovascular;  Laterality: Bilateral;    Prior to Admission medications   Medication Sig Start Date End Date Taking? Authorizing Provider  apixaban (ELIQUIS) 2.5 MG TABS tablet Take 1 tablet (2.5 mg total) by mouth 2 (two) times daily. 07/08/23  Yes West, Katlyn D, NP  atorvastatin (LIPITOR) 40 MG tablet Take 1 tablet (40 mg total) by mouth daily. 03/20/16  Yes Croitoru, Mihai, MD  Cyanocobalamin 500 MCG SUBL Take 500 mcg by mouth daily at 6 (six) AM. 09/08/22  Yes [provider]  furosemide (LASIX) 40 MG tablet Take 1 tablet (40 mg total) by mouth daily. 07/08/23 10/06/23 Yes West, Katlyn D, NP  KRILL OIL PO Take 1 capsule by mouth  daily.    Yes [provider]  latanoprost (XALATAN) 0.005 % ophthalmic solution Place 1 drop into both eyes at bedtime. 09/05/19  Yes [provider]  Magnesium Oxide 400 MG CAPS Take 1 capsule (400 mg total) by mouth in the morning and at bedtime. Patient taking differently: Take 400 mg by mouth daily. 07/07/23  Yes Croitoru, Mihai, MD  propylthiouracil (PTU) 50 MG tablet Take 50 mg by mouth daily.   Yes [provider]  triamcinolone (NASACORT) 55 MCG/ACT AERO nasal inhaler Place 2 sprays into the nose daily.   Yes [provider]  trolamine salicylate (ASPERCREME) 10 % cream Apply 1 application topically as needed for muscle pain.   Yes [provider]  albuterol (PROVENTIL HFA;VENTOLIN HFA) 108 (90 Base) MCG/ACT inhaler Inhale 2 puffs into the lungs every 6 (six) hours as needed for wheezing or shortness of breath. Patient not taking: Reported on 08/15/2023 02/19/17   Croitoru, Rachelle Hora, MD  apixaban (ELIQUIS) 2.5 MG TABS tablet Take 1 tablet (2.5 mg total) by mouth 2 (two) times daily. Patient not taking: Reported on 08/15/2023 07/08/23   Reather Littler D, NP  metoprolol tartrate (  LOPRESSOR) 25 MG tablet TAKE 1 TABLET BY MOUTH TWICE DAILY Patient not taking: Reported on 08/15/2023 09/03/22   Croitoru, Rachelle Hora, MD    Current Facility-Administered Medications  Medication Dose Route Frequency Provider Last Rate Last Admin   0.9 %  sodium chloride infusion  250 mL Intravenous PRN Ghimire, Werner Lean, MD       acetaminophen (TYLENOL) tablet 650 mg  650 mg Oral Q6H PRN Ghimire, Werner Lean, MD       Or   acetaminophen (TYLENOL) suppository 650 mg  650 mg Rectal Q6H PRN Maretta Bees, MD       albuterol (PROVENTIL) (2.5 MG/3ML) 0.083% nebulizer solution 2.5 mg  2.5 mg Nebulization TID Maretta Bees, MD   2.5 mg at 08/16/23 0132   Ampicillin-Sulbactam (UNASYN) 3 g in sodium chloride 0.9 % 100 mL IVPB  3 g Intravenous Q12H Maretta Bees, MD   Stopped at  08/15/23 2152   arformoterol (BROVANA) nebulizer solution 15 mcg  15 mcg Nebulization BID Maretta Bees, MD   15 mcg at 08/16/23 0746   atorvastatin (LIPITOR) tablet 40 mg  40 mg Oral Daily Maretta Bees, MD   40 mg at 08/15/23 2059   budesonide (PULMICORT) nebulizer solution 0.25 mg  0.25 mg Nebulization BID Maretta Bees, MD   0.25 mg at 08/16/23 0745   doxycycline (VIBRA-TABS) tablet 100 mg  100 mg Oral Q12H Ghimire, Werner Lean, MD   100 mg at 08/15/23 2116   guaiFENesin (MUCINEX) 12 hr tablet 600 mg  600 mg Oral BID Maretta Bees, MD       ipratropium (ATROVENT) nebulizer solution 0.5 mg  0.5 mg Nebulization TID Maretta Bees, MD   0.5 mg at 08/16/23 0132   methylPREDNISolone sodium succinate (SOLU-MEDROL) 40 mg/mL injection 40 mg  40 mg Intravenous Q12H Maretta Bees, MD   40 mg at 08/16/23 2440   metoprolol tartrate (LOPRESSOR) tablet 25 mg  25 mg Oral BID Maretta Bees, MD   25 mg at 08/15/23 2116   ondansetron (ZOFRAN) tablet 4 mg  4 mg Oral Q6H PRN Maretta Bees, MD       Or   ondansetron (ZOFRAN) injection 4 mg  4 mg Intravenous Q6H PRN Ghimire, Werner Lean, MD       pantoprazole (PROTONIX) injection 40 mg  40 mg Intravenous Q6H Ghimire, Werner Lean, MD   40 mg at 08/16/23 0745   Followed by   Melene Muller ON 08/19/2023] pantoprazole (PROTONIX) injection 40 mg  40 mg Intravenous Q12H Ghimire, Werner Lean, MD       polyethylene glycol (MIRALAX / GLYCOLAX) packet 17 g  17 g Oral Daily PRN Ghimire, Werner Lean, MD       propylthiouracil (PTU) tablet 50 mg  50 mg Oral Daily Ghimire, Shanker M, MD       sodium chloride flush (NS) 0.9 % injection 3 mL  3 mL Intravenous Q12H Maretta Bees, MD   3 mL at 08/15/23 2152   sodium chloride flush (NS) 0.9 % injection 3 mL  3 mL Intravenous PRN Maretta Bees, MD   3 mL at 08/16/23 1027   Current Outpatient Medications  Medication Sig Dispense Refill   apixaban (ELIQUIS) 2.5 MG TABS tablet Take 1 tablet (2.5 mg  total) by mouth 2 (two) times daily. 90 tablet 3   atorvastatin (LIPITOR) 40 MG tablet Take 1 tablet (40 mg total) by mouth daily. 30 tablet 11  Cyanocobalamin 500 MCG SUBL Take 500 mcg by mouth daily at 6 (six) AM.     furosemide (LASIX) 40 MG tablet Take 1 tablet (40 mg total) by mouth daily. 90 tablet 3   KRILL OIL PO Take 1 capsule by mouth daily.      latanoprost (XALATAN) 0.005 % ophthalmic solution Place 1 drop into both eyes at bedtime.     Magnesium Oxide 400 MG CAPS Take 1 capsule (400 mg total) by mouth in the morning and at bedtime. (Patient taking differently: Take 400 mg by mouth daily.) 180 capsule 3   propylthiouracil (PTU) 50 MG tablet Take 50 mg by mouth daily.     triamcinolone (NASACORT) 55 MCG/ACT AERO nasal inhaler Place 2 sprays into the nose daily.     trolamine salicylate (ASPERCREME) 10 % cream Apply 1 application topically as needed for muscle pain.     albuterol (PROVENTIL HFA;VENTOLIN HFA) 108 (90 Base) MCG/ACT inhaler Inhale 2 puffs into the lungs every 6 (six) hours as needed for wheezing or shortness of breath. (Patient not taking: Reported on 08/15/2023) 1 Inhaler 5   apixaban (ELIQUIS) 2.5 MG TABS tablet Take 1 tablet (2.5 mg total) by mouth 2 (two) times daily. (Patient not taking: Reported on 08/15/2023)     metoprolol tartrate (LOPRESSOR) 25 MG tablet TAKE 1 TABLET BY MOUTH TWICE DAILY (Patient not taking: Reported on 08/15/2023) 180 tablet 3    Allergies as of 08/15/2023 - Review Complete 08/15/2023  Allergen Reaction Noted   Azithromycin Dermatitis 05/14/2023   Cefuroxime Other (See Comments) 05/14/2023    Family History  Problem Relation Age of Onset   Stroke Mother 50   Hypertension Mother    Coronary artery disease Father 26    Social History   Socioeconomic History   Marital status: Married    Spouse name: Not on file   Number of children: Not on file   Years of education: Not on file   Highest education level: Not on file  Occupational  History   Not on file  Tobacco Use   Smoking status: Every Day    Current packs/day: 0.50    Average packs/day: 0.5 packs/day for 25.0 years (12.5 ttl pk-yrs)    Types: Cigarettes, Pipe   Smokeless tobacco: Never   Tobacco comments:    08/04/2012 "still smoke a cigar q once in awhile"    05/14/2023 Patient "smokes 2-5 cigarettes daily"  Substance and Sexual Activity   Alcohol use: Yes    Comment: OCCASIONAL   Drug use: No   Sexual activity: Never  Other Topics Concern   Not on file  Social History Narrative   Not on file   Social Drivers of Health   Financial Resource Strain: Not on file  Food Insecurity: No Food Insecurity (07/13/2023)   Hunger Vital Sign    Worried About Running Out of Food in the Last Year: Never true    Ran Out of Food in the Last Year: Never true  Transportation Needs: No Transportation Needs (07/13/2023)   PRAPARE - Administrator, Civil Service (Medical): No    Lack of Transportation (Non-Medical): No  Recent Concern: Transportation Needs - Unmet Transportation Needs (07/13/2023)   PRAPARE - Administrator, Civil Service (Medical): Yes    Lack of Transportation (Non-Medical): Yes  Physical Activity: Not on file  Stress: Not on file  Social Connections: Not on file  Intimate Partner Violence: Not At Risk (07/13/2023)   Humiliation,  Afraid, Rape, and Kick questionnaire    Fear of Current or Ex-Partner: No    Emotionally Abused: No    Physically Abused: No    Sexually Abused: No    Review of Systems: Pertinent positive and negative review of systems were noted in the above HPI section.  All other review of systems was otherwise negative.   Physical Exam: Vital signs in last 24 hours: Temp:  [97.5 F (36.4 C)-97.9 F (36.6 C)] 97.9 F (36.6 C) (01/20 0500) Pulse Rate:  [33-119] 92 (01/20 0730) Resp:  [14-42] 18 (01/20 0630) BP: (103-137)/(44-97) 134/57 (01/20 0730) SpO2:  [87 %-100 %] 100 % (01/20 0810) Weight:  [70.8  kg] 70.8 kg (01/19 1339)   General:   Alert,  Well-developed, thin elderly white male, pleasant and cooperative in NAD, very hard of hearing, ill-appearing Head:  Normocephalic and atraumatic. Eyes:  Sclera clear, no icterus.   Conjunctiva . Ears:  Normal auditory acuity. Nose:  No deformity, discharge,  or lesions. Mouth:  No deformity or lesions.   Neck:  Supple; no masses or thyromegaly. Lungs: Scattered rhonchi bilaterally , coughing.  Heart:  Regular rate and rhythm; no murmurs, clicks, rubs,  or gallops. Abdomen:  Soft,nontender, BS active,nonpalp mass or hsm.   Rectal: Not done, documented heme positive dark stool Msk:  Symmetrical without gross deformities. . Pulses:  Normal pulses noted. Extremities:  Without clubbing or edema. Neurologic:  Alert and  oriented x4;  grossly normal neurologically. Skin:  Intact without significant lesions or rashes.. Psych:  Alert and cooperative. Normal mood and affect.  Intake/Output from previous day: 01/19 0701 - 01/20 0700 In: 559.6 [Blood:359.6; IV Piggyback:200] Out: -  Intake/Output this shift: No intake/output data recorded.  Lab Results: Recent Labs    08/15/23 1408 08/16/23 0534  WBC 7.0 11.5*  HGB 7.4* 9.2*  HCT 24.7* 29.7*  PLT 247 206   BMET Recent Labs    08/15/23 1408 08/16/23 0534  NA 138 138  K 3.1* 3.8  CL 99 101  CO2 27 26  GLUCOSE 152* 190*  BUN 78* 80*  CREATININE 1.89* 2.14*  CALCIUM 9.7 9.5   LFT Recent Labs    08/16/23 0534  PROT 5.9*  ALBUMIN 2.0*  AST 20  ALT 16  ALKPHOS 64  BILITOT 1.8*   PT/INR No results for input(s): "LABPROT", "INR" in the last 72 hours. Hepatitis Panel No results for input(s): "HEPBSAG", "HCVAB", "HEPAIGM", "HEPBIGM" in the last 72 hours.    IMPRESSION:  #86 89 year old white male, nursing home resident admitted yesterday with altered mental status and shortness of breath  Workup in the emergency room with chest x-ray showing multi focal pneumonia, and  respiratory panel positive for COVID-19 Only out of the window for Paxlovid, currently on steroids and covering empirically for superimposed bacterial pneumonia. Sputum cultures pending  #2 acute on chronic congestive heart failure #3 melena in setting of chronic Eliquis-Per chart patient has been having dark stools over the past week, no known history of GI bleeding BUN is elevated out of proportion to creatinine consistent with upper GI source. Has had 1 small dark bowel movement since admission  Rule out ulcerative esophagitis, gastropathy, peptic ulcer disease, neoplasm, AVMs  #4 anemia acute on chronic baseline hemoglobin around 12 as of September 2024  #5 diabetes mellitus #6.  Coronary artery disease #7.  Atrial fibrillation-on chronic Eliquis #8.  COPD #9.  History of left subclavian stenosis with prior stents #10 decreased auditory acuity #11.  Squamous cell skin CA undergoing radiation  Plan; full liquid diet Continue IV PPI twice daily Hold Eliquis Trend hemoglobin every 8 hours and transfuse to keep hemoglobin in the 7-8 range. Patient is a poor endoscopic candidate currently, high risk for complications with sedation.  Would allow Eliquis to washout, if he improves significantly over the next days from pneumonia standpoint has had evidence of further bleeding then can consider EGD. Per notes palliative care to see today for goals of care GI will follow with you   Amy EsterwoodPA-C  08/16/2023, 9:37 AM     Attending physician's note   I have taken history, reviewed the chart and examined the patient. I performed a substantive portion of this encounter, including complete performance of at least one of the key components, in conjunction with the APP. I agree with the Advanced Practitioner's note, impression and recommendations.   89yr old NH pt with multiple comorbidities including A-fib on Eliquis (last dose 1/19), HTN, DM, CAD, CHF, COPD, CKD  Adm with multifocal  pneumonia (COVID 19/bacterial) with resp failure on Unasyn/Doxy/steroids/O2/nebulizers  GI consulted for melena with Hb 7.4 s/p 1U to 9.2. (baseline 12 03/2023). No further melena.  Plan: -Hold Eliquis -IV Protonix -Trend CBC.  Transfuse as needed -Await palliative care recommendations. Pt is a poor candidate for any endo procedures and is very high risk for complications including death. Hence, would like to avoid endoscopy if possible.   Edman Circle, MD [facility] [phone]  Addendum: Agree with palliative care consult to avoid escalation of care in event of decline.  If decline, transition to full comfort.  They have family meeting scheduled for tomorrow.  Will await for the outcome.

## 2023-08-16 NOTE — ED Notes (Signed)
RT notified for NT suctioning.

## 2023-08-16 NOTE — Progress Notes (Signed)
CSW received call from [place], care navigator at [facility] who states patient is from independent living at the facility. [facility] states patient can go to the skilled side at the facility if necessary. Al[facility] Felling can be reached at [phone] for discharge planning purposes.  Edwin Dada, MSW, LCSW Transitions of Care  Clinical Social Worker II [phone]

## 2023-08-16 NOTE — Progress Notes (Signed)
Heart Failure Navigator Progress Note  Assessed for Heart & Vascular TOC clinic readiness.  Patient does not meet criteria due to per MD note admission more pneumonia related. No HF TOC. .   Navigator will sign off at this time.   Rhae Hammock, BSN, Scientist, clinical (histocompatibility and immunogenetics) Only

## 2023-08-16 NOTE — ED Notes (Signed)
Pt removed leads and Dc'd LAC IV, stated they were wanting to remove all clothing and change into pajamas. This RN reminded pt they were in the hospital and didn't have any additional clothing. Skin tear to left forearm dressed with nonadherent pad, gauze and bandaged lightly with coban for securement. Ecchymosis noted to BL arms from attempting to raise self by siderails. Seizure precaution pads placed on bilateral siderails for safety. Bed linens changed.

## 2023-08-17 ENCOUNTER — Ambulatory Visit: Payer: Medicare Other

## 2023-08-17 DIAGNOSIS — K922 Gastrointestinal hemorrhage, unspecified: Secondary | ICD-10-CM | POA: Diagnosis not present

## 2023-08-17 DIAGNOSIS — I2510 Atherosclerotic heart disease of native coronary artery without angina pectoris: Secondary | ICD-10-CM

## 2023-08-17 DIAGNOSIS — N179 Acute kidney failure, unspecified: Secondary | ICD-10-CM

## 2023-08-17 DIAGNOSIS — U071 COVID-19: Secondary | ICD-10-CM

## 2023-08-17 DIAGNOSIS — D649 Anemia, unspecified: Secondary | ICD-10-CM | POA: Diagnosis not present

## 2023-08-17 DIAGNOSIS — E876 Hypokalemia: Secondary | ICD-10-CM

## 2023-08-17 DIAGNOSIS — J1282 Pneumonia due to coronavirus disease 2019: Secondary | ICD-10-CM

## 2023-08-17 DIAGNOSIS — I484 Atypical atrial flutter: Secondary | ICD-10-CM

## 2023-08-17 DIAGNOSIS — G9341 Metabolic encephalopathy: Secondary | ICD-10-CM | POA: Diagnosis not present

## 2023-08-17 DIAGNOSIS — N1831 Chronic kidney disease, stage 3a: Secondary | ICD-10-CM

## 2023-08-17 DIAGNOSIS — I509 Heart failure, unspecified: Secondary | ICD-10-CM | POA: Diagnosis not present

## 2023-08-17 DIAGNOSIS — I5032 Chronic diastolic (congestive) heart failure: Secondary | ICD-10-CM

## 2023-08-17 LAB — HEMOGLOBIN AND HEMATOCRIT, BLOOD
HCT: 27.9 % — ABNORMAL LOW (ref 39.0–52.0)
Hemoglobin: 8.7 g/dL — ABNORMAL LOW (ref 13.0–17.0)

## 2023-08-17 MED ORDER — GLYCOPYRROLATE 1 MG PO TABS: 1.0000 mg | ORAL_TABLET | ORAL | 0 refills | Status: DC | PRN

## 2023-08-17 MED ORDER — PANTOPRAZOLE SODIUM 40 MG PO TBEC: 40.0000 mg | DELAYED_RELEASE_TABLET | Freq: Every day | ORAL | Status: DC

## 2023-08-17 MED ORDER — ACETAMINOPHEN 650 MG RE SUPP: 650.0000 mg | Freq: Four times a day (QID) | RECTAL | Status: DC | PRN

## 2023-08-17 MED ORDER — LORAZEPAM 2 MG/ML PO CONC: 1.0000 mg | ORAL | Status: DC | PRN

## 2023-08-17 MED ORDER — DIPHENHYDRAMINE HCL 50 MG/ML IJ SOLN: 12.5000 mg | INTRAMUSCULAR | Status: DC | PRN

## 2023-08-17 MED ORDER — HYDROMORPHONE HCL 1 MG/ML IJ SOLN: 0.5000 mg | INTRAMUSCULAR | Status: DC | PRN

## 2023-08-17 MED ORDER — ALBUTEROL SULFATE (2.5 MG/3ML) 0.083% IN NEBU: 2.5000 mg | INHALATION_SOLUTION | RESPIRATORY_TRACT | Status: DC | PRN

## 2023-08-17 MED ORDER — POLYVINYL ALCOHOL 1.4 % OP SOLN: 1.0000 [drp] | Freq: Four times a day (QID) | OPHTHALMIC | Status: DC | PRN

## 2023-08-17 MED ORDER — GLYCOPYRROLATE 0.2 MG/ML IJ SOLN: 0.2000 mg | INTRAMUSCULAR | Status: DC | PRN

## 2023-08-17 MED ORDER — METOPROLOL TARTRATE 25 MG PO TABS: 25.0000 mg | ORAL_TABLET | Freq: Two times a day (BID) | ORAL | Status: DC

## 2023-08-17 MED ORDER — ACETAMINOPHEN 325 MG PO TABS: 650.0000 mg | ORAL_TABLET | Freq: Four times a day (QID) | ORAL | Status: DC | PRN

## 2023-08-17 MED ORDER — HALOPERIDOL LACTATE 5 MG/ML IJ SOLN: 2.0000 mg | Freq: Four times a day (QID) | INTRAMUSCULAR | Status: DC | PRN

## 2023-08-17 MED ORDER — LORAZEPAM 1 MG PO TABS: 1.0000 mg | ORAL_TABLET | ORAL | Status: DC | PRN

## 2023-08-17 MED ORDER — OXYCODONE HCL 20 MG/ML PO CONC: 5.0000 mg | ORAL | 0 refills | Status: DC | PRN

## 2023-08-17 MED ORDER — IPRATROPIUM-ALBUTEROL 0.5-2.5 (3) MG/3ML IN SOLN: 3.0000 mL | Freq: Two times a day (BID) | RESPIRATORY_TRACT | Status: DC

## 2023-08-17 MED ORDER — BIOTENE DRY MOUTH MT LIQD: 15.0000 mL | Freq: Two times a day (BID) | OROMUCOSAL | Status: DC

## 2023-08-17 MED ORDER — HALOPERIDOL 1 MG PO TABS: 2.0000 mg | ORAL_TABLET | Freq: Four times a day (QID) | ORAL | Status: DC | PRN

## 2023-08-17 MED ORDER — GLYCOPYRROLATE 1 MG PO TABS: 1.0000 mg | ORAL_TABLET | ORAL | Status: DC | PRN

## 2023-08-17 MED ORDER — LORAZEPAM 2 MG/ML PO CONC: 1.0000 mg | ORAL | 0 refills | Status: DC | PRN

## 2023-08-17 MED ORDER — OXYCODONE HCL 20 MG/ML PO CONC: 5.0000 mg | ORAL | Status: DC | PRN

## 2023-08-17 MED ORDER — GUAIFENESIN ER 600 MG PO TB12: 600.0000 mg | ORAL_TABLET | Freq: Two times a day (BID) | ORAL | Status: DC | PRN

## 2023-08-17 MED ORDER — LORAZEPAM 2 MG/ML IJ SOLN: 1.0000 mg | INTRAMUSCULAR | Status: DC | PRN

## 2023-08-17 MED ORDER — ONDANSETRON HCL 4 MG/2ML IJ SOLN: 4.0000 mg | Freq: Four times a day (QID) | INTRAMUSCULAR | Status: DC | PRN

## 2023-08-17 MED ORDER — ONDANSETRON 4 MG PO TBDP: 4.0000 mg | ORAL_TABLET | Freq: Four times a day (QID) | ORAL | Status: DC | PRN

## 2023-08-17 MED ORDER — HALOPERIDOL LACTATE 2 MG/ML PO CONC: 2.0000 mg | Freq: Four times a day (QID) | ORAL | Status: DC | PRN

## 2023-08-17 NOTE — Plan of Care (Signed)
  Problem: Education: Goal: Knowledge of risk factors and measures for prevention of condition will improve Outcome: Adequate for Discharge   Problem: Coping: Goal: Psychosocial and spiritual needs will be supported Outcome: Adequate for Discharge   Problem: Respiratory: Goal: Will maintain a patent airway Outcome: Adequate for Discharge Goal: Complications related to the disease process, condition or treatment will be avoided or minimized Outcome: Adequate for Discharge   Problem: Education: Goal: Knowledge of General Education information will improve Description: Including pain rating scale, medication(s)/side effects and non-pharmacologic comfort measures Outcome: Adequate for Discharge   Problem: Health Behavior/Discharge Planning: Goal: Ability to manage health-related needs will improve Outcome: Adequate for Discharge   Problem: Clinical Measurements: Goal: Ability to maintain clinical measurements within normal limits will improve Outcome: Adequate for Discharge Goal: Will remain free from infection Outcome: Adequate for Discharge Goal: Diagnostic test results will improve Outcome: Adequate for Discharge Goal: Respiratory complications will improve Outcome: Adequate for Discharge Goal: Cardiovascular complication will be avoided Outcome: Adequate for Discharge   Problem: Activity: Goal: Risk for activity intolerance will decrease Outcome: Adequate for Discharge   Problem: Nutrition: Goal: Adequate nutrition will be maintained Outcome: Adequate for Discharge   Problem: Coping: Goal: Level of anxiety will decrease Outcome: Adequate for Discharge   Problem: Elimination: Goal: Will not experience complications related to bowel motility Outcome: Adequate for Discharge Goal: Will not experience complications related to urinary retention Outcome: Adequate for Discharge   Problem: Pain Managment: Goal: General experience of comfort will improve and/or be  controlled Outcome: Adequate for Discharge   Problem: Safety: Goal: Ability to remain free from injury will improve Outcome: Adequate for Discharge   Problem: Skin Integrity: Goal: Risk for impaired skin integrity will decrease Outcome: Adequate for Discharge   Problem: Education: Goal: Knowledge of the prescribed therapeutic regimen will improve Outcome: Adequate for Discharge   Problem: Coping: Goal: Ability to identify and develop effective coping behavior will improve Outcome: Adequate for Discharge   Problem: Clinical Measurements: Goal: Quality of life will improve Outcome: Adequate for Discharge   Problem: Respiratory: Goal: Verbalizations of increased ease of respirations will increase Outcome: Adequate for Discharge   Problem: Role Relationship: Goal: Family's ability to cope with current situation will improve Outcome: Adequate for Discharge Goal: Ability to verbalize concerns, feelings, and thoughts to partner or family member will improve Outcome: Adequate for Discharge   Problem: Pain Management: Goal: Satisfaction with pain management regimen will improve Outcome: Adequate for Discharge

## 2023-08-17 NOTE — Progress Notes (Signed)
Pt stated for me to leave him alone, I told him I had his breathing treatments for him and he said no he does not want them to leave him alone.

## 2023-08-17 NOTE — Progress Notes (Signed)
PT Cancellation Note  Patient Details Name: DEONDREA HARNISCH MRN: [id number] DOB: [birth year]   Cancelled Treatment:    Reason Eval/Treat Not Completed: Other (comment) (Pt transitioning to comfort care. Will sign off.)   Gladys Damme 08/17/2023, 11:39 AM

## 2023-08-17 NOTE — Consult Note (Addendum)
WOC Nurse Consult Note: Reason for Consult: Requested to assess a wound stage 2 on the legs with multiple abrasions. Pt was confused and refuse to be assess with no violence. I did a gentle look and assess his legs and I could find one single wound on the left leg.  Wound type: Full thickness with slough, no dept, on the left leg. Pressure Injury POA: NA Measurement: 2cm x 1.5cm x 0.1cm, oval. Wound bed: 90% yellow, 10% red Drainage (amount, consistency, odor) scant amount, no odor. Periwound: intact Dressing procedure/placement/frequency: Use moisture cream on the intact skin on his legs, every dressing change. Apply Xeroform (change daily), cover with foam dressing, change every 3 days or PRN.  WOC team will not plan to follow further.  Please reconsult if further assistance is needed. Thank-you,  Denyse Amass BSN, RN, [employer], WOC  (Pager: [phone])

## 2023-08-17 NOTE — Progress Notes (Signed)
Oncology Nurse Navigator Documentation   At Dr. Colletta [place] request I called [facility], [facility] to let them know that he has transitioned to comfort care after a short hospitalization. Shaun Mclaughlin has stopped his radiation to his skin cancer. [facility] office voiced their appreciation for the phone call.   Shaun Slade RN, BSN, [facility] Head & Neck Oncology Nurse Navigator [facility] at [facility] Phone # [phone]  Fax # [phone]

## 2023-08-17 NOTE — Progress Notes (Signed)
PROGRESS NOTE        PATIENT DETAILS Name: Shaun Mclaughlin Age: 89 y.o. Sex: male Date of Birth: Jul 17, 1935 Admit Date: 08/15/2023 Admitting Physician Dewayne Shorter Levora Dredge, MD [id number], [name], MD  Brief Summary: Patient is a 89 y.o.  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  Significant events: 1/19>> admit to [facility]-Hb 7.4-right> left infiltrates on CXR-very frail-DNR-kept n.p.o. due to aspiration risk. 1/20>> SLP evaluation-keep n.p.o.  Significant studies: 1/19>> CXR: Infiltrate mid/lower right lung with small effusion-small infiltrate left base. 1/19>> CT head: No acute intracranial process  Significant microbiology data: 1/19>> COVID-19 PCR: Positive 1/19>> influenza/RSV PCR: Negative 1/19>> blood culture: Negative  Procedures: None  Consults: GI Palliative care  Subjective: Lying comfortably in bed-denies any chest pain or shortness of breath.  Remains confused but is also very hard of hearing.  Objective: Vitals: Blood pressure 133/89, pulse 80, temperature 97.8 F (36.6 C), temperature source Oral, resp. rate (!) 25, height 5\' 7"  (1.702 m), weight 70.8 kg, SpO2 93%.   Exam: Gen Exam:not in any distress HEENT:atraumatic, normocephalic Chest: B/L clear to auscultation anteriorly CVS:S1S2 regular Abdomen:soft non tender, non distended Extremities:+edema Neurology: Non focal Skin: no rash  Pertinent Labs/Radiology:    Latest Ref Rng & Units 08/17/2023    2:12 AM 08/16/2023    5:11 PM 08/16/2023    5:34 AM  CBC  WBC 4.0 - 10.5 K/uL   11.5   Hemoglobin 13.0 - 17.0 g/dL 8.7  8.9  9.2   Hematocrit 39.0  - 52.0 % 27.9  28.7  29.7   Platelets 150 - 400 K/uL   206     Lab Results  Component Value Date   NA 138 08/16/2023   K 3.8 08/16/2023   CL 101 08/16/2023   CO2 26 08/16/2023      Assessment/Plan: Upper GI bleeding with acute blood loss anemia Black stools for several days prior to this hospitalization No overt bleeding overnight Hb stable after 1 unit of PRBC on 1/20 Continue PPI GI following but awaiting further goals of care discussion-currently remains frail/tenuous for EGD.   Multifocal pneumonia-likely aspiration Overall stable Cultures negative Mild leukocytosis Remains n.p.o. after SLP evaluation-high risk for aspiration-family meeting scheduled for later today Continue Unasyn/doxycycline  COVID-19 infection History/exam and chest x-ray pattern is more consistent aspiration pneumonia Plan for a short course of steroids given how frail/tenuous he is  AKI on CKD stage IIIb Hemodynamically mediated-lying flat-volume status stable Continue to hold diuretics No blood work done today but if family continues to desire treatment-will repeat electrolytes tomorrow morning.  Acute on chronic HFpEF Volume status is reasonable-lying flat this morning Given worsening creatinine-overall tenuous status-suspect we can continue to hold diuretics  Acute metabolic encephalopathy Pleasantly confused but very hard of hearing Etiology likely of from PNA/COVID-19/AKI CT head negative on admission Supportive care  Oropharyngeal dysphagia Likely chronic but worsened due to acute infection/decompensation Upon SLP eval-1/20-high risk of aspiration-kept n.p.o. Family meeting with palliative care scheduled for later today   Persistent atrial fibrillation Rate controlled-continue metoprolol with holding parameters Eliquis on hold due  to GI bleeding.   History of CAD-s/p PCI to RCA 2003 No anginal symptoms-exertional dyspnea is likely from PNA/anemia Beta-blocker/statin    PAD-history of left subclavian artery stenosis-s/p prior stenting Per prior notes-blood pressure should be obtained in the right arm.   Hypokalemia Repleted   Hypomagnesemia Repleted   COPD Not in exacerbation Continue bronchodilators   Hyperthyroidism PTU-follow CBC/LFTs TSH/FT4 stable   History of squamous cell carcinoma of the skin-involving his left temple Apparently is getting ongoing radiation therapy Supportive care in the interim.   Palliative care DNR/DNI in place High risk for aspiration upon SLP evaluation-kept n.p.o. He appears very frail/debilitated-although stabilized with supportive care-suspect he will continue to have aspiration episodes in the future.  Reasonable to consider transitioning to comfort measures if family agreeable. Will await further recommendations from palliative care team.  Pressure Ulcer: Agree with assessment and plan as outlined below Pressure Injury 08/16/23 Sacrum Stage 1 -  Intact skin with non-blanchable redness of a localized area usually over a bony prominence. (Active)  08/16/23 1710  Location: Sacrum  Location Orientation:   Staging: Stage 1 -  Intact skin with non-blanchable redness of a localized area usually over a bony prominence.  Wound Description (Comments):   Present on Admission: Yes  Dressing Type Foam - Lift dressing to assess site every shift 08/16/23 1950     Pressure Injury 08/16/23 Heel Left;Posterior Stage 1 -  Intact skin with non-blanchable redness of a localized area usually over a bony prominence. (Active)  08/16/23 1710  Location: Heel  Location Orientation: Left;Posterior  Staging: Stage 1 -  Intact skin with non-blanchable redness of a localized area usually over a bony prominence.  Wound Description (Comments):   Present on Admission: Yes  Dressing Type Foam - Lift dressing to assess site every shift 08/16/23 1950     Pressure Injury 08/16/23 Heel Right Stage 1 -  Intact skin with non-blanchable  redness of a localized area usually over a bony prominence. (Active)  08/16/23 1711  Location: Heel  Location Orientation: Right  Staging: Stage 1 -  Intact skin with non-blanchable redness of a localized area usually over a bony prominence.  Wound Description (Comments):   Present on Admission: Yes  Dressing Type Foam - Lift dressing to assess site every shift 08/16/23 1950    BMI: Estimated body mass index is 24.43 kg/m as calculated from the following:   Height as of this encounter: 5\' 7"  (1.702 m).   Weight as of this encounter: 70.8 kg.   Code status:   Code Status: Limited: Do not attempt resuscitation (DNR) -DNR-LIMITED -Do Not Intubate/DNI    DVT Prophylaxis: SCDs Start: 08/15/23 1833   Family Communication: None at bedside-but family meeting scheduled for later today with the palliative care team.   Disposition Plan: Status is: Inpatient Remains inpatient appropriate because: Severity of illness   Planned Discharge Destination:Skilled nursing facility versus hospice   Diet: Diet Order             Diet NPO time specified Except for: Ice Chips, Other (See Comments)  Diet effective now                     Antimicrobial agents: Anti-infectives (From admission, onward)    Start     Dose/Rate Route Frequency Ordered Stop   08/15/23 2200  doxycycline (VIBRA-TABS) tablet 100 mg        100 mg Oral Every 12 hours 08/15/23 1836     08/15/23  2200  Ampicillin-Sulbactam (UNASYN) 3 g in sodium chloride 0.9 % 100 mL IVPB        3 g 200 mL/hr over 30 Minutes Intravenous Every 12 hours 08/15/23 1900     08/15/23 1500  cefTRIAXone (ROCEPHIN) 1 g in sodium chloride 0.9 % 100 mL IVPB        1 g 200 mL/hr over 30 Minutes Intravenous  Once 08/15/23 1458 08/15/23 1600   08/15/23 1500  doxycycline (VIBRA-TABS) tablet 100 mg        100 mg Oral  Once 08/15/23 1458 08/15/23 1517        MEDICATIONS: Scheduled Meds:  arformoterol  15 mcg Nebulization BID   atorvastatin   40 mg Oral Daily   budesonide (PULMICORT) nebulizer solution  0.25 mg Nebulization BID   doxycycline  100 mg Oral Q12H   guaiFENesin  600 mg Oral BID   ipratropium-albuterol  3 mL Nebulization TID   metoprolol tartrate  25 mg Oral BID   pantoprazole (PROTONIX) IV  40 mg Intravenous Q6H   Followed by   Melene Muller ON 08/19/2023] pantoprazole (PROTONIX) IV  40 mg Intravenous Q12H   predniSONE  40 mg Oral Q breakfast   propylthiouracil  50 mg Oral Daily   sodium chloride flush  3 mL Intravenous Q12H   Continuous Infusions:  ampicillin-sulbactam (UNASYN) IV 3 g (08/16/23 2246)   PRN Meds:.acetaminophen **OR** acetaminophen, ondansetron **OR** ondansetron (ZOFRAN) IV, polyethylene glycol, sodium chloride flush   I have personally reviewed following labs and imaging studies  LABORATORY DATA: CBC: Recent Labs  Lab 08/15/23 1408 08/16/23 0534 08/16/23 1711 08/17/23 0212  WBC 7.0 11.5*  --   --   NEUTROABS 6.4  --   --   --   HGB 7.4* 9.2* 8.9* 8.7*  HCT 24.7* 29.7* 28.7* 27.9*  MCV 87.3 87.1  --   --   PLT 247 206  --   --     Basic Metabolic Panel: Recent Labs  Lab 08/15/23 1408 08/15/23 1517 08/16/23 0534  NA 138  --  138  K 3.1*  --  3.8  CL 99  --  101  CO2 27  --  26  GLUCOSE 152*  --  190*  BUN 78*  --  80*  CREATININE 1.89*  --  2.14*  CALCIUM 9.7  --  9.5  MG  --  1.5* 2.4    GFR: Estimated Creatinine Clearance: 23.2 mL/min (A) (by C-G formula based on SCr of 2.14 mg/dL (H)).  Liver Function Tests: Recent Labs  Lab 08/15/23 1408 08/16/23 0534  AST 18 20  ALT 13 16  ALKPHOS 63 64  BILITOT 1.3* 1.8*  PROT 5.5* 5.9*  ALBUMIN 2.0* 2.0*   No results for input(s): "LIPASE", "AMYLASE" in the last 168 hours. No results for input(s): "AMMONIA" in the last 168 hours.  Coagulation Profile: No results for input(s): "INR", "PROTIME" in the last 168 hours.  Cardiac Enzymes: No results for input(s): "CKTOTAL", "CKMB", "CKMBINDEX", "TROPONINI" in the last 168  hours.  BNP (last 3 results) No results for input(s): "PROBNP" in the last 8760 hours.  Lipid Profile: No results for input(s): "CHOL", "HDL", "LDLCALC", "TRIG", "CHOLHDL", "LDLDIRECT" in the last 72 hours.  Thyroid Function Tests: Recent Labs    08/16/23 0534  TSH 1.499  FREET4 0.94    Anemia Panel: No results for input(s): "VITAMINB12", "FOLATE", "FERRITIN", "TIBC", "IRON", "RETICCTPCT" in the last 72 hours.  Urine analysis:    Component Value  Date/Time   COLORURINE YELLOW 04/15/2023 2300   APPEARANCEUR CLEAR 04/15/2023 2300   LABSPEC 1.010 04/15/2023 2300   PHURINE 5.0 04/15/2023 2300   GLUCOSEU NEGATIVE 04/15/2023 2300   HGBUR NEGATIVE 04/15/2023 2300   BILIRUBINUR NEGATIVE 04/15/2023 2300   KETONESUR 5 (A) 04/15/2023 2300   PROTEINUR NEGATIVE 04/15/2023 2300   NITRITE NEGATIVE 04/15/2023 2300   LEUKOCYTESUR NEGATIVE 04/15/2023 2300    Sepsis Labs: Lactic Acid, Venous No results found for: "LATICACIDVEN"  MICROBIOLOGY: Recent Results (from the past 240 hours)  Resp panel by RT-PCR (RSV, Flu A&B, Covid) Anterior Nasal Swab     Status: Abnormal   Collection Time: 08/15/23  2:11 PM   Specimen: Anterior Nasal Swab  Result Value Ref Range Status   SARS Coronavirus 2 by RT PCR POSITIVE (A) NEGATIVE Final   Influenza A by PCR NEGATIVE NEGATIVE Final   Influenza B by PCR NEGATIVE NEGATIVE Final    Comment: (NOTE) The Xpert Xpress SARS-CoV-2/FLU/RSV plus assay is intended as an aid in the diagnosis of influenza from Nasopharyngeal swab specimens and should not be used as a sole basis for treatment. Nasal washings and aspirates are unacceptable for Xpert Xpress SARS-CoV-2/FLU/RSV testing.  Fact Sheet for Patients: BloggerCourse.com  Fact Sheet for Healthcare Providers: SeriousBroker.it  This test is not yet approved or cleared by the [place] FDA and has been authorized for detection and/or diagnosis of  SARS-CoV-2 by FDA under an Emergency Use Authorization (EUA). This EUA will remain in effect (meaning this test can be used) for the duration of the COVID-19 declaration under Section 564(b)(1) of the Act, 21 U.S.C. section 360bbb-3(b)(1), unless the authorization is terminated or revoked.     Resp Syncytial Virus by PCR NEGATIVE NEGATIVE Final    Comment: (NOTE) Fact Sheet for Patients: BloggerCourse.com  Fact Sheet for Healthcare Providers: SeriousBroker.it  This test is not yet approved or cleared by the [place] FDA and has been authorized for detection and/or diagnosis of SARS-CoV-2 by FDA under an Emergency Use Authorization (EUA). This EUA will remain in effect (meaning this test can be used) for the duration of the COVID-19 declaration under Section 564(b)(1) of the Act, 21 U.S.C. section 360bbb-3(b)(1), unless the authorization is terminated or revoked.  Performed at [facility], [address], [place], [place] [address]   Culture, blood (Routine X 2) w Reflex to ID Panel     Status: None (Preliminary result)   Collection Time: 08/15/23  6:36 PM   Specimen: BLOOD  Result Value Ref Range Status   Specimen Description BLOOD RIGHT ANTECUBITAL  Final   Special Requests   Final    BOTTLES DRAWN AEROBIC AND ANAEROBIC Blood Culture results may not be optimal due to an inadequate volume of blood received in culture bottles   Culture   Final    NO GROWTH < 24 HOURS Performed at [facility], [address], [place], [place] [address]    Report Status PENDING  Incomplete  Culture, blood (Routine X 2) w Reflex to ID Panel     Status: None (Preliminary result)   Collection Time: 08/15/23  6:41 PM   Specimen: BLOOD  Result Value Ref Range Status   Specimen Description BLOOD BLOOD RIGHT HAND  Final   Special Requests   Final    AEROBIC BOTTLE ONLY Blood Culture results may not be optimal due to an inadequate  volume of blood received in culture bottles   Culture   Final  NO GROWTH < 24 HOURS Performed at [facility], [address], [place], [place] [address]    Report Status PENDING  Incomplete    RADIOLOGY STUDIES/RESULTS: DG Swallowing Func-Speech Pathology Result Date: 08/16/2023 Table formatting from the original result was not included. Images from the original result were not included. Modified Barium Swallow Study Patient Details Name: KEVEN PYLES MRN: [id number] Date of Birth: 12/31/1934 Today's Date: 08/16/2023 HPI/PMH: HPI: JESAIAH TOMME is a 89 y.o. male admitted with confusion and shortness of breath. Pt found to have pneumonia related to COVID-19 and possibly superimposed bacterial pna.  MD writes " He has significant infiltrates-mostly in his right lung. Suspect this is mostly aspiration-unclear if he has some COVID-19 PNA as well."  Also upper GI bleeding with acute blood loss anemia.  Pt apparently has been residing at [facility] for the past year.   Pt with medical history significant of HTN, HLD, DM-2, CAD, persistent atrial fibrillation chronic HFpEF, CAD squamous cell carcinoma of the skin (face)-currently getting radiation . Clinical Impression: Pt demonstrates a moderate/severe oropharyngeal dysphagia with chronic and gross silent aspiration of liquid consistencies and mild residue of solid textures (DIGEST Score of 3). Pt exhibits poor oral control of liquids with premature spillage and lingual residue that spills post swallow. Initiation of swallow can at times be significantly delayed with liquids pooling in the pyriform sinuses. Pt has silent aspiration of all liquids, primarily due to pooling in the pyriforms. Cued cough is completely ineffective in clearing aspirate; cough is a repetitive huff without strength or crispness. Apart from delay and sensory impairment, pt also has moderate pharyngeal weakness with decreased hyoid excursion, laryngeal elevation and partial  epiglottic deflection. Barium pooling around the glottis and vestibule shows an abnormal rounded tissue on the anterior aspect of the vocal folds. Esophageal sweep also shows severe stasis suggesting a motility impairment. Pt is at high risk of aspiration and may have been suffering from a chronic dysphagia prior to admission that is now worse in severity due to weakness. Will f/u for solutions to diet and risk, but would advise discussion with palliative care to consider diet with known risk given severity. Factors that may increase risk of adverse event in presence of aspiration Rubye Oaks & Clearance Coots 2021): Factors that may increase risk of adverse event in presence of aspiration Rubye Oaks & Clearance Coots 2021): Frail or deconditioned; Respiratory or GI disease; Poor general health and/or compromised immunity; Aspiration of thick, dense, and/or acidic materials; Frequent aspiration of large volumes; Weak cough Recommendations/Plan: Swallowing Evaluation Recommendations Swallowing Evaluation Recommendations Recommendations: Ice chips PRN after oral care Medication Administration: Crushed with puree Supervision: Patient able to self-feed Oral care recommendations: Oral care QID (4x/day) Treatment Plan Treatment Plan Treatment recommendations: Therapy as outlined in treatment plan below Follow-up recommendations: Skilled nursing-short term rehab (<3 hours/day) Functional status assessment: Patient has had a recent decline in their functional status and demonstrates the ability to make significant improvements in function in a reasonable and predictable amount of time. Treatment frequency: Min 2x/week Treatment duration: 2 weeks Interventions: Compensatory techniques; Aspiration precaution training; Patient/family education; Trials of upgraded texture/liquids; Oropharyngeal exercises Recommendations Recommendations for follow up therapy are one component of a multi-disciplinary discharge planning process, led by the attending  physician.  Recommendations may be updated based on patient status, additional functional criteria and insurance authorization. Assessment: Orofacial Exam: Orofacial Exam Oral Cavity - Dentition: Missing dentition Anatomy: Anatomy: Other (Comment) (rounded tissue in glottis) Boluses Administered: Boluses Administered Boluses Administered:  Thin liquids (Level 0); Mildly thick liquids (Level 2, nectar thick); Moderately thick liquids (Level 3, honey thick); Puree  Oral Impairment Domain: Oral Impairment Domain Lip Closure: No labial escape Tongue control during bolus hold: Not tested (pt unabl edue to [facility]) Bolus transport/lingual motion: Slow tongue motion Oral residue: Residue collection on oral structures Location of oral residue : Tongue Initiation of pharyngeal swallow : Pyriform sinuses  Pharyngeal Impairment Domain: Pharyngeal Impairment Domain Soft palate elevation: No bolus between soft palate (SP)/pharyngeal wall (PW) Laryngeal elevation: Partial superior movement of thyroid cartilage/partial approximation of arytenoids to epiglottic petiole Anterior hyoid excursion: Partial anterior movement Epiglottic movement: Partial inversion Laryngeal vestibule closure: Incomplete, narrow column air/contrast in laryngeal vestibule Pharyngeal stripping wave : Present - complete Pharyngoesophageal segment opening: Complete distension and complete duration, no obstruction of flow Tongue base retraction: Trace column of contrast or air between tongue base and PPW Pharyngeal residue: Collection of residue within or on pharyngeal structures Location of pharyngeal residue: Tongue base; Valleculae; Pyriform sinuses  Esophageal Impairment Domain: Esophageal Impairment Domain Esophageal clearance upright position: Esophageal retention Pill: No data recorded Penetration/Aspiration Scale Score: Penetration/Aspiration Scale Score 1.  Material does not enter airway: Puree 8.  Material enters airway, passes BELOW cords without attempt  by patient to eject out (silent aspiration) : Thin liquids (Level 0); Mildly thick liquids (Level 2, nectar thick); Moderately thick liquids (Level 3, honey thick) Compensatory Strategies: No data recorded  General Information: Caregiver present: No  Diet Prior to this Study: NPO   Temperature : Normal   Respiratory Status: WFL   Supplemental O2: None (Room air)   History of Recent Intubation: No  Behavior/Cognition: Alert; Cooperative; Other (Comment) Self-Feeding Abilities: Needs assist with self-feeding Baseline vocal quality/speech: Hypophonia/low volume Volitional Cough: Able to elicit Volitional Swallow: Able to elicit No data recorded Goal Planning: Prognosis for improved oropharyngeal function: Guarded Barriers to Reach Goals: Severity of deficits; Overall medical prognosis No data recorded No data recorded No data recorded Pain: Pain Assessment Pain Assessment: No/denies pain End of Session: Start Time:SLP Start Time (ACUTE ONLY): 1400 Stop Time: SLP Stop Time (ACUTE ONLY): 1425 Time Calculation:SLP Time Calculation (min) (ACUTE ONLY): 25 min Charges: SLP Evaluations $ SLP Speech Visit: 1 Visit SLP Evaluations $BSS Swallow: 1 Procedure $MBS Swallow: 1 Procedure $Swallowing Treatment: 1 Procedure SLP visit diagnosis: SLP Visit Diagnosis: Dysphagia, oropharyngeal phase (R13.12) Past Medical History: Past Medical History: Diagnosis Date  Acute on chronic combined systolic and diastolic congestive heart failure, NYHA class 2 (HCC) 06/29/2014  Arthritis   "maybe a little bit in some joints" (08/04/2012)  Atherosclerotic renal artery stenosis, unilateral (HCC)   lleft renal artery stenosis by duplex ultrasound  CAD (coronary artery disease),Hx RCA stenting-patent 06/2012  08/05/2012  Chronic anticoagulation, coumadin for PAF 08/05/2012  Chronic bronchitis (HCC)   "about q yr" (08/04/2012)  DM (diabetes mellitus) (HCC) 08/05/2012  External bleeding hemorrhoids ~ 2003; 2008; 2013  "removed polyps and no bleeding since"  (08/04/2012)  GERD (gastroesophageal reflux disease)   Hip fracture (HCC) 10/2016  RIGHT FEMORAL NECK   HTN (hypertension) 08/05/2012  Hypercholesteremia   Hyperlipidemia 08/05/2012  Hyperthyroidism   Kidney stone 1980's  PAF (paroxysmal atrial fibrillation), maintaining SR 08/05/2012  Pneumonia ~ 2008  PVD (peripheral vascular disease) with claudication, Lt Upper ext. pain, and known 80-90%distal Lt. subclavian artery stenosis  08/05/2012  S/P angioplasty with stent to Lt. subclavian artery -Nitrinol stent 08/04/12 08/05/2012  Type II diabetes mellitus (HCC)   "take RX; I'm prediabetic; don't  have to  check my CBG qd; keep my weight down" (08/04/2012) Past Surgical History: Past Surgical History: Procedure Laterality Date  CARDIAC CATHETERIZATION  2008 and Dec 2013  patent coronaries  CARDIOVERSION N/A 07/13/2014  Procedure: CARDIOVERSION;  Surgeon: Thurmon Fair, MD;  Location: [facility];  Service: Cardiovascular;  Laterality: N/A;  CARDIOVERSION N/A 09/28/2014  Procedure: CARDIOVERSION;  Surgeon: Lars Masson, MD;  Location: [facility];  Service: Cardiovascular;  Laterality: N/A;  CATARACT EXTRACTION W/ INTRAOCULAR LENS IMPLANT    "right eye" (08/04/2012)  CORONARY ANGIOPLASTY WITH STENT PLACEMENT  2003  patent 12/13  GLAUCOMA SURGERY  2000's  "right eye; had laser procedure  to lower the pressure and prevent glaucoma" (08/04/2012)  HERNIA REPAIR  2000's  "umbilical" (08/04/2012)  INGUINAL HERNIA REPAIR  2000's  "right" (08/04/2012)  KIDNEY STONE SURGERY  1980's  LEFT HEART CATHETERIZATION WITH CORONARY ANGIOGRAM N/A 06/28/2012  Procedure: LEFT HEART CATHETERIZATION WITH CORONARY ANGIOGRAM;  Surgeon: [name], MD;  Location[facility] CATH LAB;  Service: Cardiovascular;  Laterality: N/A;  LITHOTRIPSY  1980's  "imploded in my kidney; ended up having to be cut open in my back" (08/04/2012)  SKIN CANCER EXCISION  2017 & 2018  SUBCLAVIAN STENT PLACEMENT  Jan 2014  Lt SCA  TONSILLECTOMY  1940's  TOTAL HIP ARTHROPLASTY Right  10/27/2016  Procedure: TOTAL HIP ARTHROPLASTY ANTERIOR APPROACH;  Surgeon: Samson Frederic, MD;  Location: [facility] OR;  Service: Orthopedics;  Laterality: Right;  UNILATERAL UPPER EXTREMEITY ANGIOGRAM N/A 08/04/2012  Procedure: UNILATERAL UPPER EXTREMEITY ANGIOGRAM;  Surgeon: [name], MD;  Location: [facility] CATH LAB;  Service: Cardiovascular;  Laterality: N/A;  UPPER EXTREMITY ANGIOGRAM Bilateral 06/28/2012  Procedure: UPPER EXTREMITY ANGIOGRAM;  Surgeon: [name], MD;  Location: [facility] C[facility] LAB;  Service: Cardiovascular;  Laterality: Bilateral; Harlon Ditty, MA CCC-SLP Acute Rehabilitation Services Secure Chat Preferred Office [phone] Claudine Mouton 08/16/2023, 3:18 PM  CT Head Wo Contrast Result Date: 08/15/2023 CLINICAL DATA:  Altered level of consciousness EXAM: CT HEAD WITHOUT CONTRAST TECHNIQUE: Contiguous axial images were obtained from the base of the skull through the vertex without intravenous contrast. RADIATION DOSE REDUCTION: This exam was performed according to the departmental dose-optimization program which includes automated exposure control, adjustment of the mA and/or kV according to patient size and/or use of iterative reconstruction technique. COMPARISON:  04/15/2023 FINDINGS: Brain: Stable chronic small-vessel ischemic changes within the periventricular white matter. Stable encephalomalacia right cerebellar hemisphere consistent with chronic infarct. No evidence of acute infarct or hemorrhage. Lateral ventricles and remaining midline structures appear unremarkable. There are no acute extra-axial fluid collections. No mass effect. Vascular: Stable atherosclerosis.  No hyperdense vessel. Skull: Normal. Negative for fracture or focal lesion. Sinuses/Orbits: No acute finding. Other: None. IMPRESSION: 1. Stable head CT.  No acute intracranial process. Electronically Signed   By: Sharlet Salina M.D.   On: 08/15/2023 15:42   DG Chest Portable 1 View Result Date: 08/15/2023 CLINICAL  DATA:  Shortness of breath and weakness beginning today. EXAM: PORTABLE CHEST 1 VIEW COMPARISON:  04/15/2023 FINDINGS: Patient is slightly rotated to the right. Lungs are adequately inflated demonstrate opacification over the mid to lower right lung. Evidence small right effusion with fluid tracking to the right apex. Minimal left base opacification. Mild stable cardiomegaly. Remainder of the exam is unchanged. IMPRESSION: 1. Opacification over the mid to lower right lung with small right effusion. Minimal left base opacification. Suspect multifocal infection. 2. Mild stable cardiomegaly. Electronically Signed   By: Elberta Fortis M.D.  On: 08/15/2023 14:38     LOS: 2 days   Jeoffrey Massed, MD  [facility]    To contact the attending provider between 7A-7P or the covering provider during after hours 7P-7A, please log into the web site [email or web address] and access using universal [place] password for that web site. If you do not have the password, please call the hospital operator.  08/17/2023, 8:45 AM

## 2023-08-17 NOTE — Progress Notes (Signed)
Daily Progress Note   Patient Name: Shaun Mclaughlin       Date: 08/17/2023 DOB: 06-04-1935  Age: 89 y.o. MRN#: [id number] Attending Physician: Maretta Bees, MD Primary Care Physician: Tally Joe, MD Admit Date: 08/15/2023  Reason for Consultation/Follow-up: Establishing goals of care  Subjective: I have reviewed medical records including EPIC notes, MAR, any available advanced directives as necessary, and labs.   9:30 AM Met at patient's bedside for scheduled family meeting. Patient is lying in bed. No signs or non-verbal gestures of pain or discomfort noted. No respiratory distress, increased work of breathing, or secretions noted. Moved to private space on 31M per family request.   Emotional support provided to family.   Met with DIL/Ashley, son/Reid, daughter/Roxeen  to discuss diagnosis, prognosis, GOC, EOL wishes, disposition, and options.  I introduced Palliative Medicine as specialized medical care for people living with serious illness. It focuses on providing relief from the symptoms and stress of a serious illness. The goal is to improve quality of life for both the patient and the family.  We discussed a brief life review of the patient as well as functional and nutritional status. Prior to hospitalization, patient was living at [facility]. Patient was relatively independent; however, has had recent gradual decline that started at the end of last summer - shuffling gait with increased falls, decreased oral intake. Family indicate mentally, patient "is fully with it" but has decreased functional status. Overall, family indicate patient felt his quality of life was "not great" prior to hospitalization.   We discussed patient's current illness and what it means in the larger  context of patient's on-going co-morbidities. Family understands that CHF and CKD are progressive, non-curable diseases underlying the patient's current acute medical conditions. We also discussed his cancer. His severe aspiration risk due to dysphagia and risk for recurrent infection/hospitalizations. Natural disease trajectory and expectations at EOL were discussed. I attempted to elicit values and goals of care important to the patient. The difference between aggressive medical intervention (including needing artificial feeding) and comfort care was considered in light of the patient's goals of care.   Family are clear patient would not want his life prolonged with feeding tube.  Reviewed conversation had with patient yesterday. Morrie Sheldon is not surprised he did not want to discuss goals. They have had previous  discussions regarding his wishes and he does not wish to think further about next steps. Per family, they have indicated patient is "tired."   We talked about transition to comfort measures in house and what that would entail inclusive of medications to control pain, dyspnea, agitation, nausea, and itching. We discussed stopping all unnecessary measures such as blood draws, needle sticks, oxygen, antibiotics, CBGs/insulin, cardiac monitoring, IVF, and frequent vital signs. Education provided that other non-pharmacological interventions would be utilized for holistic support and comfort such as spiritual support if requested, repositioning, music therapy, offering comfort feeds, and/or therapeutic listening. All care would focus on how the patient is looking and feeling.   Provided education and counseling at length on the philosophy and benefits of hospice care. Discussed that it offers a holistic approach to care in the setting of end-stage illness, and is about supporting the patient where they are allowing nature to take it's course. Discussed the hospice team includes RNs, physicians, social  workers, and chaplains. They can provide personal care, support for the family, and help keep patient out of the hospital as well as assist with DME needs for home hospice. Education provided on the difference between home vs residential hospice.   Allowed space and time for family to discuss information. Family opt for patient's transition to full comfort measures today. Concept of comfort feeds with known risk of aspiration was discussed in detail - family express understanding. Goal is for patient's discharge back to [facility] with hospice care. We reviewed he will likely need 24/7 supervision/support after discharge - will get TOC involved to see if [facility] can accommodate this change in level of care.  Prognosis reviewed.  Visit also consisted of discussions dealing with the complex and emotionally intense issues of symptom management and palliative care in the setting of serious and potentially life-threatening illness.   Discussed with family the importance of continued conversation with each other and the medical providers regarding overall plan of care and treatment options, ensuring decisions are within the context of the patient's values and GOCs.    Questions and concerns were addressed. The patient/family was encouraged to call with questions and/or concerns. PMT card was provided.   All questions and concerns addressed. Encouraged to call with questions and/or concerns. PMT card provided.  Length of Stay: 2  Current Medications: Scheduled Meds:   arformoterol  15 mcg Nebulization BID   atorvastatin  40 mg Oral Daily   budesonide (PULMICORT) nebulizer solution  0.25 mg Nebulization BID   doxycycline  100 mg Oral Q12H   guaiFENesin  600 mg Oral BID   ipratropium-albuterol  3 mL Nebulization TID   metoprolol tartrate  25 mg Oral BID   pantoprazole (PROTONIX) IV  40 mg Intravenous Q6H   Followed by   Melene Muller ON 08/19/2023] pantoprazole (PROTONIX) IV  40 mg Intravenous Q12H    predniSONE  40 mg Oral Q breakfast   propylthiouracil  50 mg Oral Daily   sodium chloride flush  3 mL Intravenous Q12H    Continuous Infusions:  ampicillin-sulbactam (UNASYN) IV 3 g (08/16/23 2246)    PRN Meds: acetaminophen **OR** acetaminophen, ondansetron **OR** ondansetron (ZOFRAN) IV, polyethylene glycol, sodium chloride flush  Physical Exam Vitals and nursing note reviewed.  Constitutional:      General: He is not in acute distress.    Appearance: He is cachectic. He is ill-appearing.  Pulmonary:     Effort: No respiratory distress.  Skin:    General: Skin is warm and  dry.  Neurological:     Mental Status: He is alert.     Motor: Weakness present.  Psychiatric:        Attention and Perception: Attention normal.        Behavior: Behavior is cooperative.        Cognition and Memory: Cognition and memory normal.             Vital Signs: BP (!) 131/95 (BP Location: Right Arm)   Pulse (!) 110   Temp 97.6 F (36.4 C) (Axillary)   Resp 20   Ht 5\' 7"  (1.702 m)   Wt 70.8 kg   SpO2 93%   BMI 24.43 kg/m  SpO2: SpO2: 93 % O2 Device: O2 Device: Room Air O2 Flow Rate:    Intake/output summary: No intake or output data in the 24 hours ending 08/17/23 1017 LBM: Last BM Date : 08/16/23 Baseline Weight: Weight: 70.8 kg Most recent weight: Weight: 70.8 kg       Palliative Assessment/Data: PPS 20-30%      Patient Active Problem List   Diagnosis Date Noted   Acute metabolic encephalopathy 08/15/2023   Squamous cell carcinoma of skin of unspecified parts of face 07/13/2023   Hyperkalemia 04/15/2023   Acute renal failure superimposed on stage 3a chronic kidney disease (HCC) 04/15/2023   Thyroid nodule 04/15/2023   Lung nodule 04/15/2023   Posterior vitreous detachment of both eyes 01/01/2021   Nonproliferative diabetic retinopathy of right eye (HCC) 12/07/2019   Branch retinal artery occlusion of right eye 12/07/2019   Displaced fracture of right femoral neck (HCC)  10/27/2016   S/P right hip fracture 10/26/2016   Hyperthyroidism 10/26/2016   Hip fracture (HCC) 10/25/2016   COPD (chronic obstructive pulmonary disease) (HCC) 06/26/2016   Bradycardia, drug induced 03/20/2016   Bradycardia, sinus, persistent, severe 12/20/2015   Renal artery stenosis (HCC) 02/01/2015   Atrial flutter (HCC) 08/14/2014   Chronic heart failure with preserved ejection fraction (HFpEF) (HCC) 08/14/2014   Acute on chronic combined systolic and diastolic congestive heart failure, NYHA class 2 (HCC) 06/29/2014   CAD,Hx RCA stent Jan '03. patent at cath 06/2012  08/05/2012   DM (diabetes mellitus) (HCC) 08/05/2012   Hyperlipidemia 08/05/2012   PVD - s/p LSCA stent 08/04/12 08/05/2012   HTN (hypertension) 08/05/2012    Palliative Care Assessment & Plan   Patient Profile: 89 y.o. male  with past medical history of HTN, HLD, DM-2, CAD, persistent atrial fibrillation chronic HFpEF, CAD squamous cell carcinoma of the skin (face)-currently getting radiation was admitted on 08/15/2023 with upper GIB with acute blood lose anemia, multifocal pneumonia/suspect aspiration pneumonia vs COVID 19 pneumonia, AKI on CKD 3b, acute metabolic encephalopathy, acute on chronic HFpEF, hypokalemia, and hyomagnesemia.   Assessment: Principal Problem:   Acute metabolic encephalopathy Active Problems:   CAD,Hx RCA stent Jan '03. patent at cath 06/2012    HTN (hypertension)   Atrial flutter (HCC)   Chronic heart failure with preserved ejection fraction (HFpEF) (HCC)   Hyperthyroidism   Acute renal failure superimposed on stage 3a chronic kidney disease (HCC)   Squamous cell carcinoma of skin of unspecified parts of face   Terminal care  Recommendations/Plan: Initiated full comfort measures - including comfort feeds with known risk of aspiration Continue DNR/DNI as previously documented - durable DNR form completed and placed in shadow chart. Copy was made and will be scanned into Vynca/ACP  tab Goal is for discharge back to [facility] with hospice; will need to see if  they can accommodate him on the LTC side - TOC notified and consult placed Added orders for EOL symptom management and to reflect full comfort measures, as well as discontinued orders that were not focused on comfort Unrestricted visitation orders were placed per current [place] EOL visitation policy  Nursing to provide frequent assessments and administer PRN medications as clinically necessary to ensure EOL comfort PMT will continue to follow and support holistically  Symptom Management Dilaudid IV PRN severe pain/dyspnea/increased work of breathing/RR>25 Oxycodone SL moderate pain/dyspnea/increased work of breathing/RR>25 Tylenol PRN pain/fever Biotin twice daily Benadryl PRN itching Robinul PRN secretions Haldol PRN agitation/delirium Ativan PRN anxiety/seizure/sleep/distress Zofran PRN nausea/vomiting Liquifilm Tears PRN dry eye Continue breathing treatments, metoprolol, muxinex PRN, and PTU tablet for now   Goals of Care and Additional Recommendations: Limitations on Scope of Treatment: Full Comfort Care  Code Status:    Code Status Orders  (From admission, onward)           Start     Ordered   08/15/23 1833  Do not attempt resuscitation (DNR)- Limited -Do Not Intubate (DNI)  Continuous       Question Answer Comment  If pulseless and not breathing No CPR or chest compressions.   In Pre-Arrest Conditions (Patient Is Breathing and Has A Pulse) Do not intubate. Provide all appropriate non-invasive medical interventions. Avoid ICU transfer unless indicated or required.   Consent: Discussion documented in EHR or advanced directives reviewed      08/15/23 1836           Code Status History     Date Active Date Inactive Code Status Order ID Comments User Context   04/16/2023 1349 04/19/2023 2122 Limited: Do not attempt resuscitation (DNR) -DNR-LIMITED -Do Not Intubate/DNI  [id number]   Sherryll Burger, NP Inpatient   04/15/2023 2129 04/16/2023 1349 Full Code [id number]  Briscoe Deutscher, MD ED   10/26/2016 0008 10/30/2016 1938 Full Code [id number]  Clydie Braun, MD ED       Prognosis:  < 4 weeks  Discharge Planning: Skilled Nursing Facility with Hospice  Care plan was discussed with family, TOC, Dr. Jerral Ralph  Thank you for allowing the Palliative Medicine Team to assist in the care of this patient.   Total Time 90 minutes Prolonged Time Billed  yes       Haskel Khan, NP  Please contact Palliative Medicine Team phone at [phone] for questions and concerns.   *Portions of this note are a verbal dictation therefore any spelling and/or grammatical errors are due to the "Dragon [employer] One" system interpretation.

## 2023-08-17 NOTE — Progress Notes (Signed)
Nutrition Brief Note  Chart reviewed. Pt now transitioning to comfort care.  No further nutrition interventions planned at this time.  Please re-consult as needed.   Jamelle Haring RDN, LDN Clinical Dietitian   If unable to reach, please contact "RD Inpatient" secure chat group between 8 am-4 pm daily"

## 2023-08-17 NOTE — Progress Notes (Signed)
[facility] Liaison Note  Received request from [place], Transitions of Care Manager, for hospice services in facility after discharge. Spoke with Morrie Sheldon, daughter in law, to initiate education related to hospice philosophy, services, and team approach to care. She verbalized understanding of information given. Per discussion, the plan is for discharge to facility possibly today.   DME needs discussed. Patient currently has no DME needs.   Please send signed and completed DNR home with patient/family. Please provide prescriptions at discharge as needed to ensure ongoing symptom management.   [facility] information and contact numbers given to [place]. Above information shared with Osborne Casco, Transitions of Care Manager. Please call with any questions or concerns.   Thank you for the opportunity to participate in this patient's care.   Glenna Fellows BSN, Charity fundraiser, OCN [employer] [phone]

## 2023-08-17 NOTE — [place] FL2 (Signed)
[place] MEDICAID FL2 LEVEL OF CARE FORM     IDENTIFICATION  Patient Name: Shaun Mclaughlin Birthdate: 1935-05-28 Sex: male Admission Date (Current Location): 08/15/2023  [facility] and [place] Number:  Producer, television/film/video and Address:  [facility], [address], [place], [place] [address]      Provider Number: [id number]  Attending Physician Name and Address:  Maretta Bees, MD  Relative Name and Phone Number:       Current Level of Care: Hospital Recommended Level of Care: Skilled Nursing Facility Prior Approval Number:    Date Approved/Denied:   PASRR Number: [id number]  Discharge Plan: SNF    Current Diagnoses: Patient Active Problem List   Diagnosis Date Noted   Acute metabolic encephalopathy 08/15/2023   Squamous cell carcinoma of skin of unspecified parts of face 07/13/2023   Hyperkalemia 04/15/2023   Acute renal failure superimposed on stage 3a chronic kidney disease (HCC) 04/15/2023   Thyroid nodule 04/15/2023   Lung nodule 04/15/2023   Posterior vitreous detachment of both eyes 01/01/2021   Nonproliferative diabetic retinopathy of right eye (HCC) 12/07/2019   Branch retinal artery occlusion of right eye 12/07/2019   Displaced fracture of right femoral neck (HCC) 10/27/2016   S/P right hip fracture 10/26/2016   Hyperthyroidism 10/26/2016   Hip fracture (HCC) 10/25/2016   COPD (chronic obstructive pulmonary disease) (HCC) 06/26/2016   Bradycardia, drug induced 03/20/2016   Bradycardia, sinus, persistent, severe 12/20/2015   Renal artery stenosis (HCC) 02/01/2015   Atrial flutter (HCC) 08/14/2014   Chronic heart failure with preserved ejection fraction (HFpEF) (HCC) 08/14/2014   Acute on chronic combined systolic and diastolic congestive heart failure, NYHA class 2 (HCC) 06/29/2014   CAD,Hx RCA stent Jan '03. patent at cath 06/2012  08/05/2012   DM (diabetes mellitus) (HCC) 08/05/2012   Hyperlipidemia 08/05/2012   PVD - s/p  LSCA stent 08/04/12 08/05/2012   HTN (hypertension) 08/05/2012    Orientation RESPIRATION BLADDER Height & Weight     Self  Normal Incontinent Weight: 156 lb (70.8 kg) Height:  5\' 7"  (170.2 cm)  BEHAVIORAL SYMPTOMS/MOOD NEUROLOGICAL BOWEL NUTRITION STATUS      Continent Diet (See dc summary)  AMBULATORY STATUS COMMUNICATION OF NEEDS Skin   Extensive Assist Verbally PU Stage and Appropriate Care (Stage I on sacrum and heel; skin tear on arm and pretibial)                       Personal Care Assistance Level of Assistance  Bathing, Feeding, Dressing Bathing Assistance: Maximum assistance Feeding assistance: Maximum assistance Dressing Assistance: Maximum assistance     Functional Limitations Info             SPECIAL CARE FACTORS FREQUENCY                       Contractures Contractures Info: Not present    Additional Factors Info  Code Status, Allergies, Isolation Precautions Code Status Info: DNR Allergies Info: Azithromycin, Cefuroxime     Isolation Precautions Info: COVID+ 1/19     Current Medications (08/17/2023):  This is the current hospital active medication list Current Facility-Administered Medications  Medication Dose Route Frequency Provider Last Rate Last Admin   acetaminophen (TYLENOL) tablet 650 mg  650 mg Oral Q6H PRN Haskel Khan, NP       Or   acetaminophen (TYLENOL) suppository 650 mg  650 mg Rectal Q6H PRN Haskel Khan,  NP       antiseptic oral rinse (BIOTENE) solution 15 mL  15 mL Topical BID Haskel Khan, NP       arformoterol [facility]) nebulizer solution 15 mcg  15 mcg Nebulization BID Maretta Bees, MD   15 mcg at 08/16/23 1959   budesonide (PULMICORT) nebulizer solution 0.25 mg  0.25 mg Nebulization BID Maretta Bees, MD   0.25 mg at 08/16/23 1959   diphenhydrAMINE (BENADRYL) injection 12.5 mg  12.5 mg Intravenous Q4H PRN Haskel Khan, NP       glycopyrrolate (ROBINUL) tablet 1 mg  1 mg Oral Q4H PRN Haskel Khan,  NP       Or   glycopyrrolate (ROBINUL) injection 0.2 mg  0.2 mg Subcutaneous Q4H PRN Haskel Khan, NP       Or   glycopyrrolate (ROBINUL) injection 0.2 mg  0.2 mg Intravenous Q4H PRN Haskel Khan, NP       guaiFENesin (MUCINEX) 12 hr tablet 600 mg  600 mg Oral BID PRN Haskel Khan, NP       haloperidol (HALDOL) tablet 2 mg  2 mg Oral Q6H PRN Haskel Khan, NP       Or   haloperidol (HALDOL) 2 MG/ML solution 2 mg  2 mg Sublingual Q6H PRN Haskel Khan, NP       Or   haloperidol lactate (HALDOL) injection 2 mg  2 mg Intravenous Q6H PRN Haskel Khan, NP       HYDROmorphone (DILAUDID) injection 0.5-1 mg  0.5-1 mg Intravenous Q2H PRN Haskel Khan, NP       ipratropium-albuterol (DUONEB) 0.5-2.5 (3) MG/3ML nebulizer solution 3 mL  3 mL Nebulization TID Marcelino Duster, MD   3 mL at 08/16/23 1959   LORazepam (ATIVAN) tablet 1 mg  1 mg Oral Q1H PRN Haskel Khan, NP       Or   LORazepam (ATIVAN) 2 MG/ML concentrated solution 1 mg  1 mg Sublingual Q1H PRN Haskel Khan, NP       Or   LORazepam (ATIVAN) injection 1 mg  1 mg Intravenous Q1H PRN Haskel Khan, NP       metoprolol tartrate (LOPRESSOR) tablet 25 mg  25 mg Oral BID Maretta Bees, MD   25 mg at 08/16/23 2228   ondansetron (ZOFRAN-ODT) disintegrating tablet 4 mg  4 mg Oral Q6H PRN Haskel Khan, NP       Or   ondansetron [facility]) injection 4 mg  4 mg Intravenous Q6H PRN Haskel Khan, NP       oxyCODONE (ROXICODONE INTENSOL) 20 MG/ML concentrated solution 5-10 mg  5-10 mg Sublingual Q2H PRN Haskel Khan, NP       pantoprazole (PROTONIX) injection 40 mg  40 mg Intravenous Q6H Ghimire, Werner Lean, MD   40 mg at 08/17/23 0215   Followed by   Melene Muller ON 08/19/2023] pantoprazole (PROTONIX) injection 40 mg  40 mg Intravenous Q12H Ghimire, Werner Lean, MD       polyethylene glycol (MIRALAX / GLYCOLAX) packet 17 g  17 g Oral Daily PRN Ghimire, Werner Lean, MD       polyvinyl alcohol (LIQUIFILM TEARS) 1.4 % ophthalmic  solution 1 drop  1 drop Both Eyes QID PRN Haskel Khan, NP       propylthiouracil (PTU) tablet 50 mg  50 mg Oral Daily Ghimire, Werner Lean, MD       sodium  chloride flush (NS) 0.9 % injection 3 mL  3 mL Intravenous Q12H Ghimire, Shanker M, MD   3 mL at 08/16/23 2246   sodium chloride flush (NS) 0.9 % injection 3 mL  3 mL Intravenous PRN Maretta Bees, MD   3 mL at 08/16/23 7829     Discharge Medications: Please see discharge summary for a list of discharge medications.  Relevant Imaging Results:  Relevant Lab Results:   Additional Information SSN: [id number]. Hospice to follow at SNF.  Mearl Latin, LCSW

## 2023-08-17 NOTE — TOC Initial Note (Addendum)
Transition of Care [facility]) - Initial/Assessment Note    Patient Details  Name: Shaun Mclaughlin MRN: [id number] Date of Birth: 04/29/1935  Transition [facility] Care [facility]) CM/SW Contact:    Mearl Latin, LCSW Phone Number: 08/17/2023, 10:27 AM  Clinical Narrative:                 10:27am-Patient admitted from [facility] [place] with [place]. CSW received consult for hospice to follow on the SNF side of Hospice. CSW awaiting response from [facility] on bed availability and if patient can have hospice there.   11:02 AM-CSW received response from [facility] that they are expecting patient and he can admit to SNF side with Hospice (they are aware of COVID+ status). They requested CSW use [facility]. CSW sent referral for review. [facility] reported the bed will be ready this afternoon.  11:51 AM-CSW updated patient's daughter who has questions about when hospice initiates services to ensure patient is properly medicated.   Expected Discharge Plan: Skilled Nursing Facility Barriers to Discharge: Continued Medical Work up   Patient Goals and CMS Choice Patient states their goals for this hospitalization and ongoing recovery are:: Comfort   Choice offered to / list presented to : Spouse, Adult Children      Expected Discharge Plan and Services In-house Referral: Clinical Social Work   Post Acute Care Choice: Hospice, Skilled Nursing Facility Living arrangements for the past 2 months: Independent Living Facility                                      Prior Living Arrangements/Services Living arrangements for the past 2 months: Independent Living Facility Lives with:: Spouse Patient language and need for interpreter reviewed:: Yes Do you feel safe going back to the place where you live?: Yes      Need for Family Participation in Patient Care: Yes (Comment) Care giver support system in place?: Yes (comment)   Criminal Activity/Legal Involvement Pertinent to Current  Situation/Hospitalization: No - Comment as needed  Activities of Daily Living      Permission Sought/Granted Permission sought to share information with : Facility Medical sales representative, Family Supports Permission granted to share information with : No  Share Information with NAME: Morrie Sheldon  Permission granted to share info w AGENCY: [facility]/Hospice  Permission granted to share info w Relationship: Daughter  Permission granted to share info w Contact Information: [phone]  Emotional Assessment Appearance:: Appears stated age Attitude/Demeanor/Rapport: Unable to Assess Affect (typically observed): Unable to Assess Orientation: : Oriented to Self Alcohol / Substance Use: Not Applicable Psych Involvement: No (comment)  Admission diagnosis:  Hypokalemia [E87.6] Hypomagnesemia [E83.42] Symptomatic anemia [D64.9] Gastrointestinal hemorrhage, unspecified gastrointestinal hemorrhage type [K92.2] Acute metabolic encephalopathy [G93.41] Acute on chronic congestive heart failure, unspecified heart failure type (HCC) [I50.9] Pneumonia due to COVID-19 virus [U07.1, J12.82] Patient Active Problem List   Diagnosis Date Noted   Acute metabolic encephalopathy 08/15/2023   Squamous cell carcinoma of skin of unspecified parts of face 07/13/2023   Hyperkalemia 04/15/2023   Acute renal failure superimposed on stage 3a chronic kidney disease (HCC) 04/15/2023   Thyroid nodule 04/15/2023   Lung nodule 04/15/2023   Posterior vitreous detachment of both eyes 01/01/2021   Nonproliferative diabetic retinopathy of right eye (HCC) 12/07/2019   Branch retinal artery occlusion of right eye 12/07/2019   Displaced fracture of right femoral neck (HCC) 10/27/2016   S/P right hip fracture 10/26/2016  Hyperthyroidism 10/26/2016   Hip fracture (HCC) 10/25/2016   COPD (chronic obstructive pulmonary disease) (HCC) 06/26/2016   Bradycardia, drug induced 03/20/2016   Bradycardia, sinus, persistent, severe  12/20/2015   Renal artery stenosis (HCC) 02/01/2015   Atrial flutter (HCC) 08/14/2014   Chronic heart failure with preserved ejection fraction (HFpEF) (HCC) 08/14/2014   Acute on chronic combined systolic and diastolic congestive heart failure, NYHA class 2 (HCC) 06/29/2014   CAD,Hx RCA stent Jan '03. patent at cath 06/2012  08/05/2012   DM (diabetes mellitus) (HCC) 08/05/2012   Hyperlipidemia 08/05/2012   PVD - s/p LSCA stent 08/04/12 08/05/2012   HTN (hypertension) 08/05/2012   PCP:  Tally Joe, MD Pharmacy:   [facility] - [place], [place] - [address] [place] [address] Phone: [phone] Fax: [phone]  [facility] - [place], [place] - [address] [place] [address] Phone: [phone] Fax: [phone]     Social Drivers of Health (SDOH) Social History: SDOH Screenings   Food Insecurity: Patient Unable To Answer (08/16/2023)  Housing: Patient Unable To Answer (08/16/2023)  Transportation Needs: Patient Unable To Answer (08/16/2023)  Recent Concern: Transportation Needs - Unmet Transportation Needs (07/13/2023)  Utilities: Patient Unable To Answer (08/16/2023)  Depression (PHQ2-9): Low Risk  (07/13/2023)  Social Connections: Patient Unable To Answer (08/16/2023)  Tobacco Use: High Risk (08/15/2023)   SDOH Interventions:     Readmission Risk Interventions    04/19/2023   11:20 AM  Readmission Risk Prevention Plan  Transportation Screening Complete  PCP or Specialist Appt within 5-7 Days Complete  Home Care Screening Complete  Medication Review (RN CM) Referral to Pharmacy

## 2023-08-17 NOTE — TOC Transition Note (Signed)
Transition of Care [facility]) - Discharge Note   Patient Details  Name: Shaun Mclaughlin MRN: [id number] Date of Birth: June 04, 1935  Transition of Care [facility]) CM/SW Contact:  Mearl Latin, LCSW Phone Number: 08/17/2023, 1:46 PM   Clinical Narrative:    Patient will DC to: [facility] Anticipated DC date: 08/17/23 Family notified: Daughter, Charity fundraiser by: Sharin Mons (Hospice RN to meet pt at 6pm)   Per MD patient ready for DC to [facility]. RN to call report prior to discharge [phone] room [phone]). RN, patient, patient's family, and facility notified of DC. Discharge Summary and FL2 sent to facility. DC packet on chart including signed DNR and scripts. Ambulance transport requested for patient.   CSW will sign off for now as social work intervention is no longer needed. Please consult [place] again if new needs arise.     Final next level of care: Skilled Nursing Facility Barriers to Discharge: Barriers Resolved   Patient Goals and CMS Choice Patient states their goals for this hospitalization and ongoing recovery are:: Comfort   Choice offered to / list presented to : Spouse, Adult Children      Discharge Placement PASRR number recieved: 08/17/23            Patient chooses bed at: [facility] Patient to be transferred to facility by: [facility] Name of family member notified: Daughter Patient and family notified of of transfer: 08/17/23  Discharge Plan and Services Additional resources added to the After Visit Summary for   In-house Referral: Clinical Social Work   Post Acute Care Choice: Hospice, Skilled Nursing Facility                               Social Drivers of Health (SDOH) Interventions SDOH Screenings   Food Insecurity: Patient Unable To Answer (08/16/2023)  Housing: Patient Unable To Answer (08/16/2023)  Transportation Needs: Patient Unable To Answer (08/16/2023)  Recent Concern: Transportation Needs - Unmet Transportation Needs (07/13/2023)  Utilities:  Patient Unable To Answer (08/16/2023)  Depression (PHQ2-9): Low Risk  (07/13/2023)  Social Connections: Patient Unable To Answer (08/16/2023)  Tobacco Use: High Risk (08/15/2023)     Readmission Risk Interventions    04/19/2023   11:20 AM  Readmission Risk Prevention Plan  Transportation Screening Complete  PCP or Specialist Appt within 5-7 Days Complete  Home Care Screening Complete  Medication Review (RN CM) Referral to Pharmacy

## 2023-08-17 NOTE — Discharge Summary (Signed)
PATIENT DETAILS Name: Shaun Mclaughlin Age: 89 y.o. Sex: male Date of Birth: [birth year] MRN: [id number]. Admitting Physician: Maretta Bees, MD [id number], [name], MD  Admit Date: 08/15/2023 Discharge date: 08/17/2023  Recommendations for Outpatient Follow-up:  Optimize comfort care status.  Admitted From:  SNF   Disposition: Skilled nursing facility with hospice care   Discharge Condition: poor  CODE STATUS:   Code Status: Do not attempt resuscitation (DNR) - Comfort care   Diet recommendation:  Diet Order             Diet regular Room service appropriate? Yes; Fluid consistency: Thin  Diet effective now           Diet - low sodium heart healthy                    Brief Summary: Patient is a 89 y.o.  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   Significant events: 1/19>> admit to [facility]-Hb 7.4-right> left infiltrates on CXR-very frail-DNR-kept n.p.o. due to aspiration risk. 1/20>> SLP evaluation-keep n.p.o. 1/21>> transition to comfort care after family meeting by palliative care.   Significant studies: 1/19>> CXR: Infiltrate mid/lower right lung with small effusion-small infiltrate left base. 1/19>> CT head: No acute intracranial process   Significant microbiology data: 1/19>> COVID-19 PCR: Positive 1/19>> influenza/RSV PCR: Negative 1/19>> blood culture: Negative   Procedures: None   Consults: GI Palliative care  Brief Hospital Course: Upper GI bleeding with acute blood loss anemia Black stools for several days prior to this hospitalization No overt bleeding overnight Hb stable after 1 unit of PRBC on  1/20 Briefly evaluated by GI-not felt to be a endoscopy candidate given his tenuous overall clinical situation. Continue PPI if able to take oral intake-otherwise goal of care is for comfort.   Multifocal pneumonia-likely aspiration Overall stable-was treated empirically with Unasyn/doxycycline Now transition to comfort measures-antibiotics has been discontinued Evaluated by SLP-given high risk findings have recommended that he remain n.p.o.-after decision to transition to comfort measures-will allow comfort feedings with regular diet.   COVID-19 infection History/exam and chest x-ray pattern is more consistent aspiration pneumonia He was currently started on steroids given his overall tenuous clinical situation-he has now been transitioned to comfort measures-no plans to continue steroids on discharge.   AKI on CKD stage IIIb Hemodynamically mediated-lying flat-volume status stable No further blood work plan-comfort measures.   Acute on chronic HFpEF Volume status is reasonable-lying flat this morning No further blood work planned-no further diuretics planned.   Acute metabolic encephalopathy Pleasantly confused but very hard of hearing Etiology likely of from PNA/COVID-19/AKI CT head negative on admission   Oropharyngeal dysphagia Likely chronic but worsened due to acute infection/decompensation Upon SLP eval-1/20-high risk of aspiration-kept n.p.o. Family has decided to transition to comfort care-will allow comfort feedings-regular diet.   Persistent atrial fibrillation Eliquis was held on admission due to GI bleeding-no role in continuing metoprolol-comfort care.   History of CAD-s/p PCI to RCA 2003 No anginal symptoms-exertional dyspnea is likely from PNA/anemia   PAD-history of left subclavian artery stenosis-s/p prior stenting Per prior notes-blood pressure should be obtained in the right arm.   Hypokalemia Repleted   Hypomagnesemia Repleted   COPD Not  in  exacerbation Continue bronchodilators   Hyperthyroidism TSH/FT4 stable Will discontinue PTU discharge   History of squamous cell carcinoma of the skin-involving his left temple Radiation oncology aware that patient has decided to transition to comfort measures.   Palliative care DNR/DNI in place Evaluated by SLP-recommendations are to keep n.p.o.-as high risk for aspiration Family meeting completed 1/21 by palliative care team-comfort care-discharge back to his usual SNF with hospice/comfort measures.  Pressure Ulcer: Agree with assessment and plan as outlined below Pressure Injury 08/16/23 Sacrum Stage 1 -  Intact skin with non-blanchable redness of a localized area usually over a bony prominence. (Active)  08/16/23 1710  Location: Sacrum  Location Orientation:   Staging: Stage 1 -  Intact skin with non-blanchable redness of a localized area usually over a bony prominence.  Wound Description (Comments):   Present on Admission: Yes  Dressing Type Foam - Lift dressing to assess site every shift 08/16/23 1950     Pressure Injury 08/16/23 Heel Left;Posterior Stage 1 -  Intact skin with non-blanchable redness of a localized area usually over a bony prominence. (Active)  08/16/23 1710  Location: Heel  Location Orientation: Left;Posterior  Staging: Stage 1 -  Intact skin with non-blanchable redness of a localized area usually over a bony prominence.  Wound Description (Comments):   Present on Admission: Yes  Dressing Type Foam - Lift dressing to assess site every shift 08/16/23 1950     Pressure Injury 08/16/23 Heel Right Stage 1 -  Intact skin with non-blanchable redness of a localized area usually over a bony prominence. (Active)  08/16/23 1711  Location: Heel  Location Orientation: Right  Staging: Stage 1 -  Intact skin with non-blanchable redness of a localized area usually over a bony prominence.  Wound Description (Comments):   Present on Admission: Yes  Dressing Type Foam -  Lift dressing to assess site every shift 08/16/23 1950    Discharge Diagnoses:  Principal Problem:   Acute metabolic encephalopathy Active Problems:   CAD,Hx RCA stent Jan '03. patent at cath 06/2012    HTN (hypertension)   Atrial flutter (HCC)   Chronic heart failure with preserved ejection fraction (HFpEF) (HCC)   Hyperthyroidism   Acute renal failure superimposed on stage 3a chronic kidney disease (HCC)   Squamous cell carcinoma of skin of unspecified parts of face   Discharge Instructions:  Activity:  As tolerated with Full fall precautions use walker/cane & assistance as needed   Discharge Instructions     Diet - low sodium heart healthy   Complete by: As directed    Discharge wound care:   Complete by: As directed    Left lower leg: Apply Xeroform (change daily), cover with foam dressing, change every 3 days or PRN. Use moisture cream on the intact skin on his legs, every dressing change.   Increase activity slowly   Complete by: As directed       Allergies as of 08/17/2023       Reactions   Azithromycin Dermatitis   Cefuroxime Other (See Comments)   Abdominal pain        Medication List     STOP taking these medications    albuterol 108 (90 Base) MCG/ACT inhaler Commonly known as: VENTOLIN HFA Replaced by: albuterol (2.5 MG/3ML) 0.083% nebulizer solution   apixaban 2.5 MG Tabs tablet Commonly known as: ELIQUIS   atorvastatin 40 MG tablet Commonly known as: LIPITOR   Cyanocobalamin 500 MCG Subl   furosemide 40 MG tablet  Commonly known as: LASIX   KRILL OIL PO   latanoprost 0.005 % ophthalmic solution Commonly known as: XALATAN   Magnesium Oxide 400 MG Caps   propylthiouracil 50 MG tablet Commonly known as: PTU   triamcinolone 55 MCG/ACT Aero nasal inhaler Commonly known as: NASACORT   trolamine salicylate 10 % cream Commonly known as: ASPERCREME       TAKE these medications    albuterol (2.5 MG/3ML) 0.083% nebulizer  solution Commonly known as: PROVENTIL Take 3 mLs (2.5 mg total) by nebulization every 2 (two) hours as needed for wheezing or shortness of breath. Replaces: albuterol 108 (90 Base) MCG/ACT inhaler   glycopyrrolate 1 MG tablet Commonly known as: ROBINUL Take 1 tablet (1 mg total) by mouth every 4 (four) hours as needed (excessive secretions).   ipratropium-albuterol 0.5-2.5 (3) MG/3ML Soln Commonly known as: DUONEB Take 3 mLs by nebulization 2 (two) times daily.   LORazepam 2 MG/ML concentrated solution Commonly known as: ATIVAN Place 0.5 mLs (1 mg total) under the tongue every hour as needed for anxiety, seizure or sleep (distress).   metoprolol tartrate 25 MG tablet Commonly known as: LOPRESSOR Take 1 tablet (25 mg total) by mouth 2 (two) times daily.   oxyCODONE 20 MG/ML concentrated solution Commonly known as: ROXICODONE INTENSOL Place 0.3-0.5 mLs (6-10 mg total) under the tongue every 2 (two) hours as needed for moderate pain (pain score 4-6) (dyspnea, increased work of breathing, RR >25, distress).   pantoprazole 40 MG tablet Commonly known as: Protonix Take 1 tablet (40 mg total) by mouth daily.               Discharge Care Instructions  (From admission, onward)           Start     Ordered   08/17/23 0000  Discharge wound care:       Comments: Left lower leg: Apply Xeroform (change daily), cover with foam dressing, change every 3 days or PRN. Use moisture cream on the intact skin on his legs, every dressing change.   08/17/23 1127            Follow-up Information     Tally Joe, MD. Schedule an appointment as soon as possible for a visit.   Specialty: Family Medicine Why: As needed Contact information: [address], [address] [place] [address] [phone]                Allergies  Allergen Reactions   Azithromycin Dermatitis   Cefuroxime Other (See Comments)    Abdominal pain     Other Procedures/Studies: DG  Swallowing Func-Speech Pathology Result Date: 08/16/2023 Table formatting from the original result was not included. Images from the original result were not included. Modified Barium Swallow Study Patient Details Name: EILEEN GAGEN MRN: [id number] Date of Birth: 1935-07-26 Today's Date: 08/16/2023 HPI/PMH: HPI: HOUSTON CRONEY is a 89 y.o. male admitted with confusion and shortness of breath. Pt found to have pneumonia related to COVID-19 and possibly superimposed bacterial pna.  MD writes " He has significant infiltrates-mostly in his right lung. Suspect this is mostly aspiration-unclear if he has some COVID-19 PNA as well."  Also upper GI bleeding with acute blood loss anemia.  Pt apparently has been residing at [facility] for the past year.   Pt with medical history significant of HTN, HLD, DM-2, CAD, persistent atrial fibrillation chronic HFpEF, CAD squamous cell carcinoma of the skin (face)-currently getting radiation . Clinical Impression: Pt demonstrates a  moderate/severe oropharyngeal dysphagia with chronic and gross silent aspiration of liquid consistencies and mild residue of solid textures (DIGEST Score of 3). Pt exhibits poor oral control of liquids with premature spillage and lingual residue that spills post swallow. Initiation of swallow can at times be significantly delayed with liquids pooling in the pyriform sinuses. Pt has silent aspiration of all liquids, primarily due to pooling in the pyriforms. Cued cough is completely ineffective in clearing aspirate; cough is a repetitive huff without strength or crispness. Apart from delay and sensory impairment, pt also has moderate pharyngeal weakness with decreased hyoid excursion, laryngeal elevation and partial epiglottic deflection. Barium pooling around the glottis and vestibule shows an abnormal rounded tissue on the anterior aspect of the vocal folds. Esophageal sweep also shows severe stasis suggesting a motility impairment. Pt is at high  risk of aspiration and may have been suffering from a chronic dysphagia prior to admission that is now worse in severity due to weakness. Will f/u for solutions to diet and risk, but would advise discussion with palliative care to consider diet with known risk given severity. Factors that may increase risk of adverse event in presence of aspiration Rubye Oaks & Clearance Coots 2021): Factors that may increase risk of adverse event in presence of aspiration Rubye Oaks & Clearance Coots 2021): Frail or deconditioned; Respiratory or GI disease; Poor general health and/or compromised immunity; Aspiration of thick, dense, and/or acidic materials; Frequent aspiration of large volumes; Weak cough Recommendations/Plan: Swallowing Evaluation Recommendations Swallowing Evaluation Recommendations Recommendations: Ice chips PRN after oral care Medication Administration: Crushed with puree Supervision: Patient able to self-feed Oral care recommendations: Oral care QID (4x/day) Treatment Plan Treatment Plan Treatment recommendations: Therapy as outlined in treatment plan below Follow-up recommendations: Skilled nursing-short term rehab (<3 hours/day) Functional status assessment: Patient has had a recent decline in their functional status and demonstrates the ability to make significant improvements in function in a reasonable and predictable amount of time. Treatment frequency: Min 2x/week Treatment duration: 2 weeks Interventions: Compensatory techniques; Aspiration precaution training; Patient/family education; Trials of upgraded texture/liquids; Oropharyngeal exercises Recommendations Recommendations for follow up therapy are one component of a multi-disciplinary discharge planning process, led by the attending physician.  Recommendations may be updated based on patient status, additional functional criteria and insurance authorization. Assessment: Orofacial Exam: Orofacial Exam Oral Cavity - Dentition: Missing dentition Anatomy: Anatomy: Other  (Comment) (rounded tissue in glottis) Boluses Administered: Boluses Administered Boluses Administered: Thin liquids (Level 0); Mildly thick liquids (Level 2, nectar thick); Moderately thick liquids (Level 3, honey thick); Puree  Oral Impairment Domain: Oral Impairment Domain Lip Closure: No labial escape Tongue control during bolus hold: Not tested (pt unabl edue to [facility]) Bolus transport/lingual motion: Slow tongue motion Oral residue: Residue collection on oral structures Location of oral residue : Tongue Initiation of pharyngeal swallow : Pyriform sinuses  Pharyngeal Impairment Domain: Pharyngeal Impairment Domain Soft palate elevation: No bolus between soft palate (SP)/pharyngeal wall (PW) Laryngeal elevation: Partial superior movement of thyroid cartilage/partial approximation of arytenoids to epiglottic petiole Anterior hyoid excursion: Partial anterior movement Epiglottic movement: Partial inversion Laryngeal vestibule closure: Incomplete, narrow column air/contrast in laryngeal vestibule Pharyngeal stripping wave : Present - complete Pharyngoesophageal segment opening: Complete distension and complete duration, no obstruction of flow Tongue base retraction: Trace column of contrast or air between tongue base and PPW Pharyngeal residue: Collection of residue within or on pharyngeal structures Location of pharyngeal residue: Tongue base; Valleculae; Pyriform sinuses  Esophageal Impairment Domain: Esophageal Impairment Domain Esophageal clearance upright position: Esophageal  retention Pill: No data recorded Penetration/Aspiration Scale Score: Penetration/Aspiration Scale Score 1.  Material does not enter airway: Puree 8.  Material enters airway, passes BELOW cords without attempt by patient to eject out (silent aspiration) : Thin liquids (Level 0); Mildly thick liquids (Level 2, nectar thick); Moderately thick liquids (Level 3, honey thick) Compensatory Strategies: No data recorded  General Information: Caregiver  present: No  Diet Prior to this Study: NPO   Temperature : Normal   Respiratory Status: WFL   Supplemental O2: None (Room air)   History of Recent Intubation: No  Behavior/Cognition: Alert; Cooperative; Other (Comment) Self-Feeding Abilities: Needs assist with self-feeding Baseline vocal quality/speech: Hypophonia/low volume Volitional Cough: Able to elicit Volitional Swallow: Able to elicit No data recorded Goal Planning: Prognosis for improved oropharyngeal function: Guarded Barriers to Reach Goals: Severity of deficits; Overall medical prognosis No data recorded No data recorded No data recorded Pain: Pain Assessment Pain Assessment: No/denies pain End of Session: Start Time:SLP Start Time (ACUTE ONLY): 1400 Stop Time: SLP Stop Time (ACUTE ONLY): 1425 Time Calculation:SLP Time Calculation (min) (ACUTE ONLY): 25 min Charges: SLP Evaluations $ SLP Speech Visit: 1 Visit SLP Evaluations $BSS Swallow: 1 Procedure $MBS Swallow: 1 Procedure $Swallowing Treatment: 1 Procedure SLP visit diagnosis: SLP Visit Diagnosis: Dysphagia, oropharyngeal phase (R13.12) Past Medical History: Past Medical History: Diagnosis Date  Acute on chronic combined systolic and diastolic congestive heart failure, NYHA class 2 (HCC) 06/29/2014  Arthritis   "maybe a little bit in some joints" (08/04/2012)  Atherosclerotic renal artery stenosis, unilateral (HCC)   lleft renal artery stenosis by duplex ultrasound  CAD (coronary artery disease),Hx RCA stenting-patent 06/2012  08/05/2012  Chronic anticoagulation, coumadin for PAF 08/05/2012  Chronic bronchitis (HCC)   "about q yr" (08/04/2012)  DM (diabetes mellitus) (HCC) 08/05/2012  External bleeding hemorrhoids ~ 2003; 2008; 2013  "removed polyps and no bleeding since" (08/04/2012)  GERD (gastroesophageal reflux disease)   Hip fracture (HCC) 10/2016  RIGHT FEMORAL NECK   HTN (hypertension) 08/05/2012  Hypercholesteremia   Hyperlipidemia 08/05/2012  Hyperthyroidism   Kidney stone 1980's  PAF (paroxysmal atrial  fibrillation), maintaining SR 08/05/2012  Pneumonia ~ 2008  PVD (peripheral vascular disease) with claudication, Lt Upper ext. pain, and known 80-90%distal Lt. subclavian artery stenosis  08/05/2012  S/P angioplasty with stent to Lt. subclavian artery -Nitrinol stent 08/04/12 08/05/2012  Type II diabetes mellitus (HCC)   "take RX; I'm prediabetic; don't have to  check my CBG qd; keep my weight down" (08/04/2012) Past Surgical History: Past Surgical History: Procedure Laterality Date  CARDIAC CATHETERIZATION  2008 and Dec 2013  patent coronaries  CARDIOVERSION N/A 07/13/2014  Procedure: CARDIOVERSION;  Surgeon: Thurmon Fair, MD;  Location: [facility] ENDOSCOPY;  Service: Cardiovascular;  Laterality: N/A;  CARDIOVERSION N/A 09/28/2014  Procedure: CARDIOVERSION;  Surgeon: Lars Masson, MD;  Location: [facility] ENDOSCOPY;  Service: Cardiovascular;  Laterality: N/A;  CATARACT EXTRACTION W/ INTRAOCULAR LENS IMPLANT    "right eye" (08/04/2012)  CORONARY ANGIOPLASTY WITH STENT PLACEMENT  2003  patent 12/13  GLAUCOMA SURGERY  2000's  "right eye; had laser procedure  to lower the pressure and prevent glaucoma" (08/04/2012)  HERNIA REPAIR  2000's  "umbilical" (08/04/2012)  INGUINAL HERNIA REPAIR  2000's  "right" (08/04/2012)  KIDNEY STONE SURGERY  1980's  LEFT HEART CATHETERIZATION WITH CORONARY ANGIOGRAM N/A 06/28/2012  Procedure: LEFT HEART CATHETERIZATION WITH CORONARY ANGIOGRAM;  Surgeon: [name], MD;  Location: [facility] CATH LAB;  Service: Cardiovascular;  Laterality: N/A;  LITHOTRIPSY  1980's  "imploded in  my kidney; ended up having to be cut open in my back" (08/04/2012)  SKIN CANCER EXCISION  2017 & 2018  SUBCLAVIAN STENT PLACEMENT  Jan 2014  Lt SCA  TONSILLECTOMY  1940's  TOTAL HIP ARTHROPLASTY Right 10/27/2016  Procedure: TOTAL HIP ARTHROPLASTY ANTERIOR APPROACH;  Surgeon: Samson Frederic, MD;  Location: [facility] OR;  Service: Orthopedics;  Laterality: Right;  UNILATERAL UPPER EXTREMEITY ANGIOGRAM N/A 08/04/2012  Procedure: UNILATERAL UPPER EXTREMEITY  ANGIOGRAM;  Surgeon: [name], MD;  Location: [facility] CATH LAB;  Service: Cardiovascular;  Laterality: N/A;  UPPER EXTREMITY ANGIOGRAM Bilateral 06/28/2012  Procedure: UPPER EXTREMITY ANGIOGRAM;  Surgeon: [name], MD;  Location: [facility] CATH LAB;  Service: Cardiovascular;  Laterality: Bilateral; Harlon Ditty, MA CCC-SLP Acute Rehabilitation Services Secure Chat Preferred Office [phone] Claudine Mouton 08/16/2023, 3:18 PM  CT Head Wo Contrast Result Date: 08/15/2023 CLINICAL DATA:  Altered level of consciousness EXAM: CT HEAD WITHOUT CONTRAST TECHNIQUE: Contiguous axial images were obtained from the base of the skull through the vertex without intravenous contrast. RADIATION DOSE REDUCTION: This exam was performed according to the departmental dose-optimization program which includes automated exposure control, adjustment of the mA and/or kV according to patient size and/or use of iterative reconstruction technique. COMPARISON:  04/15/2023 FINDINGS: Brain: Stable chronic small-vessel ischemic changes within the periventricular white matter. Stable encephalomalacia right cerebellar hemisphere consistent with chronic infarct. No evidence of acute infarct or hemorrhage. Lateral ventricles and remaining midline structures appear unremarkable. There are no acute extra-axial fluid collections. No mass effect. Vascular: Stable atherosclerosis.  No hyperdense vessel. Skull: Normal. Negative for fracture or focal lesion. Sinuses/Orbits: No acute finding. Other: None. IMPRESSION: 1. Stable head CT.  No acute intracranial process. Electronically Signed   By: Sharlet Salina M.D.   On: 08/15/2023 15:42   DG Chest Portable 1 View Result Date: 08/15/2023 CLINICAL DATA:  Shortness of breath and weakness beginning today. EXAM: PORTABLE CHEST 1 VIEW COMPARISON:  04/15/2023 FINDINGS: Patient is slightly rotated to the right. Lungs are adequately inflated demonstrate opacification over the mid to lower right  lung. Evidence small right effusion with fluid tracking to the right apex. Minimal left base opacification. Mild stable cardiomegaly. Remainder of the exam is unchanged. IMPRESSION: 1. Opacification over the mid to lower right lung with small right effusion. Minimal left base opacification. Suspect multifocal infection. 2. Mild stable cardiomegaly. Electronically Signed   By: Elberta Fortis M.D.   On: 08/15/2023 14:38     TODAY-DAY OF DISCHARGE:  Subjective:   Oliva Bustard today has no headache,no chest abdominal pain,no new weakness tingling or numbness, feels much better wants to go home today.   Objective:   Blood pressure (!) 131/95, pulse (!) 110, temperature 97.6 F (36.4 C), temperature source Axillary, resp. rate 20, height 5\' 7"  (1.702 m), weight 70.8 kg, SpO2 93%. No intake or output data in the 24 hours ending 08/17/23 1128 Filed Weights   08/15/23 1339  Weight: 70.8 kg    Exam: Awake Alert, Oriented *3, No new F.N deficits, Normal affect [place].AT,PERRAL Supple Neck,No JVD, No cervical lymphadenopathy appriciated.  Symmetrical Chest wall movement, Good air movement bilaterally, CTAB RRR,No Gallops,Rubs or new Murmurs, No Parasternal Heave +ve B.Sounds, Abd Soft, Non tender, No organomegaly appriciated, No rebound -guarding or rigidity. No Cyanosis, Clubbing or edema, No new Rash or bruise   PERTINENT RADIOLOGIC STUDIES: DG Swallowing Func-Speech Pathology Result Date: 08/16/2023 Table formatting from the original result was not included. Images from the original result were not included.  Modified Barium Swallow Study Patient Details Name: SERIGNE JAMES MRN: [id number] Date of Birth: 1934/11/12 Today's Date: 08/16/2023 HPI/PMH: HPI: DANTRELL VANROSSUM is a 89 y.o. male admitted with confusion and shortness of breath. Pt found to have pneumonia related to COVID-19 and possibly superimposed bacterial pna.  MD writes " He has significant infiltrates-mostly in his right lung. Suspect  this is mostly aspiration-unclear if he has some COVID-19 PNA as well."  Also upper GI bleeding with acute blood loss anemia.  Pt apparently has been residing at [facility] for the past year.   Pt with medical history significant of HTN, HLD, DM-2, CAD, persistent atrial fibrillation chronic HFpEF, CAD squamous cell carcinoma of the skin (face)-currently getting radiation . Clinical Impression: Pt demonstrates a moderate/severe oropharyngeal dysphagia with chronic and gross silent aspiration of liquid consistencies and mild residue of solid textures (DIGEST Score of 3). Pt exhibits poor oral control of liquids with premature spillage and lingual residue that spills post swallow. Initiation of swallow can at times be significantly delayed with liquids pooling in the pyriform sinuses. Pt has silent aspiration of all liquids, primarily due to pooling in the pyriforms. Cued cough is completely ineffective in clearing aspirate; cough is a repetitive huff without strength or crispness. Apart from delay and sensory impairment, pt also has moderate pharyngeal weakness with decreased hyoid excursion, laryngeal elevation and partial epiglottic deflection. Barium pooling around the glottis and vestibule shows an abnormal rounded tissue on the anterior aspect of the vocal folds. Esophageal sweep also shows severe stasis suggesting a motility impairment. Pt is at high risk of aspiration and may have been suffering from a chronic dysphagia prior to admission that is now worse in severity due to weakness. Will f/u for solutions to diet and risk, but would advise discussion with palliative care to consider diet with known risk given severity. Factors that may increase risk of adverse event in presence of aspiration Rubye Oaks & Clearance Coots 2021): Factors that may increase risk of adverse event in presence of aspiration Rubye Oaks & Clearance Coots 2021): Frail or deconditioned; Respiratory or GI disease; Poor general health and/or compromised  immunity; Aspiration of thick, dense, and/or acidic materials; Frequent aspiration of large volumes; Weak cough Recommendations/Plan: Swallowing Evaluation Recommendations Swallowing Evaluation Recommendations Recommendations: Ice chips PRN after oral care Medication Administration: Crushed with puree Supervision: Patient able to self-feed Oral care recommendations: Oral care QID (4x/day) Treatment Plan Treatment Plan Treatment recommendations: Therapy as outlined in treatment plan below Follow-up recommendations: Skilled nursing-short term rehab (<3 hours/day) Functional status assessment: Patient has had a recent decline in their functional status and demonstrates the ability to make significant improvements in function in a reasonable and predictable amount of time. Treatment frequency: Min 2x/week Treatment duration: 2 weeks Interventions: Compensatory techniques; Aspiration precaution training; Patient/family education; Trials of upgraded texture/liquids; Oropharyngeal exercises Recommendations Recommendations for follow up therapy are one component of a multi-disciplinary discharge planning process, led by the attending physician.  Recommendations may be updated based on patient status, additional functional criteria and insurance authorization. Assessment: Orofacial Exam: Orofacial Exam Oral Cavity - Dentition: Missing dentition Anatomy: Anatomy: Other (Comment) (rounded tissue in glottis) Boluses Administered: Boluses Administered Boluses Administered: Thin liquids (Level 0); Mildly thick liquids (Level 2, nectar thick); Moderately thick liquids (Level 3, honey thick); Puree  Oral Impairment Domain: Oral Impairment Domain Lip Closure: No labial escape Tongue control during bolus hold: Not tested (pt unabl edue to [facility]) Bolus transport/lingual motion: Slow tongue motion Oral residue: Residue collection on oral  structures Location of oral residue : Tongue Initiation of pharyngeal swallow : Pyriform sinuses   Pharyngeal Impairment Domain: Pharyngeal Impairment Domain Soft palate elevation: No bolus between soft palate (SP)/pharyngeal wall (PW) Laryngeal elevation: Partial superior movement of thyroid cartilage/partial approximation of arytenoids to epiglottic petiole Anterior hyoid excursion: Partial anterior movement Epiglottic movement: Partial inversion Laryngeal vestibule closure: Incomplete, narrow column air/contrast in laryngeal vestibule Pharyngeal stripping wave : Present - complete Pharyngoesophageal segment opening: Complete distension and complete duration, no obstruction of flow Tongue base retraction: Trace column of contrast or air between tongue base and PPW Pharyngeal residue: Collection of residue within or on pharyngeal structures Location of pharyngeal residue: Tongue base; Valleculae; Pyriform sinuses  Esophageal Impairment Domain: Esophageal Impairment Domain Esophageal clearance upright position: Esophageal retention Pill: No data recorded Penetration/Aspiration Scale Score: Penetration/Aspiration Scale Score 1.  Material does not enter airway: Puree 8.  Material enters airway, passes BELOW cords without attempt by patient to eject out (silent aspiration) : Thin liquids (Level 0); Mildly thick liquids (Level 2, nectar thick); Moderately thick liquids (Level 3, honey thick) Compensatory Strategies: No data recorded  General Information: Caregiver present: No  Diet Prior to this Study: NPO   Temperature : Normal   Respiratory Status: WFL   Supplemental O2: None (Room air)   History of Recent Intubation: No  Behavior/Cognition: Alert; Cooperative; Other (Comment) Self-Feeding Abilities: Needs assist with self-feeding Baseline vocal quality/speech: Hypophonia/low volume Volitional Cough: Able to elicit Volitional Swallow: Able to elicit No data recorded Goal Planning: Prognosis for improved oropharyngeal function: Guarded Barriers to Reach Goals: Severity of deficits; Overall medical prognosis No data  recorded No data recorded No data recorded Pain: Pain Assessment Pain Assessment: No/denies pain End of Session: Start Time:SLP Start Time (ACUTE ONLY): 1400 Stop Time: SLP Stop Time (ACUTE ONLY): 1425 Time Calculation:SLP Time Calculation (min) (ACUTE ONLY): 25 min Charges: SLP Evaluations $ SLP Speech Visit: 1 Visit SLP Evaluations $BSS Swallow: 1 Procedure $MBS Swallow: 1 Procedure $Swallowing Treatment: 1 Procedure SLP visit diagnosis: SLP Visit Diagnosis: Dysphagia, oropharyngeal phase (R13.12) Past Medical History: Past Medical History: Diagnosis Date  Acute on chronic combined systolic and diastolic congestive heart failure, NYHA class 2 (HCC) 06/29/2014  Arthritis   "maybe a little bit in some joints" (08/04/2012)  Atherosclerotic renal artery stenosis, unilateral (HCC)   lleft renal artery stenosis by duplex ultrasound  CAD (coronary artery disease),Hx RCA stenting-patent 06/2012  08/05/2012  Chronic anticoagulation, coumadin for PAF 08/05/2012  Chronic bronchitis (HCC)   "about q yr" (08/04/2012)  DM (diabetes mellitus) (HCC) 08/05/2012  External bleeding hemorrhoids ~ 2003; 2008; 2013  "removed polyps and no bleeding since" (08/04/2012)  GERD (gastroesophageal reflux disease)   Hip fracture (HCC) 10/2016  RIGHT FEMORAL NECK   HTN (hypertension) 08/05/2012  Hypercholesteremia   Hyperlipidemia 08/05/2012  Hyperthyroidism   Kidney stone 1980's  PAF (paroxysmal atrial fibrillation), maintaining SR 08/05/2012  Pneumonia ~ 2008  PVD (peripheral vascular disease) with claudication, Lt Upper ext. pain, and known 80-90%distal Lt. subclavian artery stenosis  08/05/2012  S/P angioplasty with stent to Lt. subclavian artery -Nitrinol stent 08/04/12 08/05/2012  Type II diabetes mellitus (HCC)   "take RX; I'm prediabetic; don't have to  check my CBG qd; keep my weight down" (08/04/2012) Past Surgical History: Past Surgical History: Procedure Laterality Date  CARDIAC CATHETERIZATION  2008 and Dec 2013  patent coronaries  CARDIOVERSION N/A  07/13/2014  Procedure: CARDIOVERSION;  Surgeon: Thurmon Fair, MD;  Location: [facility] ENDOSCOPY;  Service: Cardiovascular;  Laterality: N/A;  CARDIOVERSION N/A 09/28/2014  Procedure: CARDIOVERSION;  Surgeon: Lars Masson, MD;  Location: [facility] ENDOSCOPY;  Service: Cardiovascular;  Laterality: N/A;  CATARACT EXTRACTION W/ INTRAOCULAR LENS IMPLANT    "right eye" (08/04/2012)  CORONARY ANGIOPLASTY WITH STENT PLACEMENT  2003  patent 12/13  GLAUCOMA SURGERY  2000's  "right eye; had laser procedure  to lower the pressure and prevent glaucoma" (08/04/2012)  HERNIA REPAIR  2000's  "umbilical" (08/04/2012)  INGUINAL HERNIA REPAIR  2000's  "right" (08/04/2012)  KIDNEY STONE SURGERY  1980's  LEFT HEART CATHETERIZATION WITH CORONARY ANGIOGRAM N/A 06/28/2012  Procedure: LEFT HEART CATHETERIZATION WITH CORONARY ANGIOGRAM;  Surgeon: [name], MD;  Location: [facility] CATH LAB;  Service: Cardiovascular;  Laterality: N/A;  LITHOTRIPSY  1980's  "imploded in my kidney; ended up having to be cut open in my back" (08/04/2012)  SKIN CANCER EXCISION  2017 & 2018  SUBCLAVIAN STENT PLACEMENT  Jan 2014  Lt SCA  TONSILLECTOMY  1940's  TOTAL HIP ARTHROPLASTY Right 10/27/2016  Procedure: TOTAL HIP ARTHROPLASTY ANTERIOR APPROACH;  Surgeon: Samson Frederic, MD;  Location: [facility] OR;  Service: Orthopedics;  Laterality: Right;  UNILATERAL UPPER EXTREMEITY ANGIOGRAM N/A 08/04/2012  Procedure: UNILATERAL UPPER EXTREMEITY ANGIOGRAM;  Surgeon: [name], MD;  Location[facility] C[facility] LAB;  Service: Cardiovascular;  Laterality: N/A;  UPPER EXTREMITY ANGIOGRAM Bilateral 06/28/2012  Procedure: UPPER EXTREMITY ANGIOGRAM;  Surgeon: [name], MD;  Location: [facility] CATH LAB;  Service: Cardiovascular;  Laterality: Bilateral; Harlon Ditty, MA CCC-SLP Acute Rehabilitation Services Secure Chat Preferred Office [phone] Claudine Mouton 08/16/2023, 3:18 PM  CT Head Wo Contrast Result Date: 08/15/2023 CLINICAL DATA:  Altered level of consciousness EXAM: CT HEAD WITHOUT  CONTRAST TECHNIQUE: Contiguous axial images were obtained from the base of the skull through the vertex without intravenous contrast. RADIATION DOSE REDUCTION: This exam was performed according to the departmental dose-optimization program which includes automated exposure control, adjustment of the mA and/or kV according to patient size and/or use of iterative reconstruction technique. COMPARISON:  04/15/2023 FINDINGS: Brain: Stable chronic small-vessel ischemic changes within the periventricular white matter. Stable encephalomalacia right cerebellar hemisphere consistent with chronic infarct. No evidence of acute infarct or hemorrhage. Lateral ventricles and remaining midline structures appear unremarkable. There are no acute extra-axial fluid collections. No mass effect. Vascular: Stable atherosclerosis.  No hyperdense vessel. Skull: Normal. Negative for fracture or focal lesion. Sinuses/Orbits: No acute finding. Other: None. IMPRESSION: 1. Stable head CT.  No acute intracranial process. Electronically Signed   By: Sharlet Salina M.D.   On: 08/15/2023 15:42   DG Chest Portable 1 View Result Date: 08/15/2023 CLINICAL DATA:  Shortness of breath and weakness beginning today. EXAM: PORTABLE CHEST 1 VIEW COMPARISON:  04/15/2023 FINDINGS: Patient is slightly rotated to the right. Lungs are adequately inflated demonstrate opacification over the mid to lower right lung. Evidence small right effusion with fluid tracking to the right apex. Minimal left base opacification. Mild stable cardiomegaly. Remainder of the exam is unchanged. IMPRESSION: 1. Opacification over the mid to lower right lung with small right effusion. Minimal left base opacification. Suspect multifocal infection. 2. Mild stable cardiomegaly. Electronically Signed   By: Elberta Fortis M.D.   On: 08/15/2023 14:38     PERTINENT LAB RESULTS: CBC: Recent Labs    08/15/23 1408 08/16/23 0534 08/16/23 1711 08/17/23 0212  WBC 7.0 11.5*  --   --    HGB 7.4* 9.2* 8.9* 8.7*  HCT 24.7* 29.7* 28.7* 27.9*  PLT 247 206  --   --  CMET CMP     Component Value Date/Time   NA 138 08/16/2023 0534   NA 139 07/06/2023 0950   K 3.8 08/16/2023 0534   CL 101 08/16/2023 0534   CO2 26 08/16/2023 0534   GLUCOSE 190 (H) 08/16/2023 0534   BUN 80 (H) 08/16/2023 0534   BUN 45 (H) 07/06/2023 0950   CREATININE 2.14 (H) 08/16/2023 0534   CREATININE 0.86 09/21/2014 0949   CALCIUM 9.5 08/16/2023 0534   PROT 5.9 (L) 08/16/2023 0534   ALBUMIN 2.0 (L) 08/16/2023 0534   AST 20 08/16/2023 0534   ALT 16 08/16/2023 0534   ALKPHOS 64 08/16/2023 0534   BILITOT 1.8 (H) 08/16/2023 0534   EGFR 43 (L) 07/06/2023 0950   GFRNONAA 29 (L) 08/16/2023 0534    GFR Estimated Creatinine Clearance: 23.2 mL/min (A) (by C-G formula based on SCr of 2.14 mg/dL (H)). No results for input(s): "LIPASE", "AMYLASE" in the last 72 hours. No results for input(s): "CKTOTAL", "CKMB", "CKMBINDEX", "TROPONINI" in the last 72 hours. Invalid input(s): "POCBNP" No results for input(s): "DDIMER" in the last 72 hours. No results for input(s): "HGBA1C" in the last 72 hours. No results for input(s): "CHOL", "HDL", "LDLCALC", "TRIG", "CHOLHDL", "LDLDIRECT" in the last 72 hours. Recent Labs    08/16/23 0534  TSH 1.499   No results for input(s): "VITAMINB12", "FOLATE", "FERRITIN", "TIBC", "IRON", "RETICCTPCT" in the last 72 hours. Coags: No results for input(s): "INR" in the last 72 hours.  Invalid input(s): "PT" Microbiology: Recent Results (from the past 240 hours)  Resp panel by RT-PCR (RSV, Flu A&B, Covid) Anterior Nasal Swab     Status: Abnormal   Collection Time: 08/15/23  2:11 PM   Specimen: Anterior Nasal Swab  Result Value Ref Range Status   SARS Coronavirus 2 by RT PCR POSITIVE (A) NEGATIVE Final   Influenza A by PCR NEGATIVE NEGATIVE Final   Influenza B by PCR NEGATIVE NEGATIVE Final    Comment: (NOTE) The Xpert Xpress SARS-CoV-2/FLU/RSV plus assay is intended as  an aid in the diagnosis of influenza from Nasopharyngeal swab specimens and should not be used as a sole basis for treatment. Nasal washings and aspirates are unacceptable for Xpert Xpress SARS-CoV-2/FLU/RSV testing.  Fact Sheet for Patients: BloggerCourse.com  Fact Sheet for Healthcare Providers: SeriousBroker.it  This test is not yet approved or cleared by the [place] FDA and has been authorized for detection and/or diagnosis of SARS-CoV-2 by FDA under an Emergency Use Authorization (EUA). This EUA will remain in effect (meaning this test can be used) for the duration of the COVID-19 declaration under Section 564(b)(1) of the Act, 21 U.S.C. section 360bbb-3(b)(1), unless the authorization is terminated or revoked.     Resp Syncytial Virus by PCR NEGATIVE NEGATIVE Final    Comment: (NOTE) Fact Sheet for Patients: BloggerCourse.com  Fact Sheet for Healthcare Providers: SeriousBroker.it  This test is not yet approved or cleared by the [place] FDA and has been authorized for detection and/or diagnosis of SARS-CoV-2 by FDA under an Emergency Use Authorization (EUA). This EUA will remain in effect (meaning this test can be used) for the duration of the COVID-19 declaration under Section 564(b)(1) of the Act, 21 U.S.C. section 360bbb-3(b)(1), unless the authorization is terminated or revoked.  Performed at [facility], [address], [place], [place] [address]   Culture, blood (Routine X 2) w Reflex to ID Panel     Status: None (Preliminary result)   Collection Time: 08/15/23  6:36 PM  Specimen: BLOOD  Result Value Ref Range Status   Specimen Description BLOOD RIGHT ANTECUBITAL  Final   Special Requests   Final    BOTTLES DRAWN AEROBIC AND ANAEROBIC Blood Culture results may not be optimal due to an inadequate volume of blood received in culture bottles    Culture   Final    NO GROWTH 2 DAYS Performed at [facility], [address], [place], [place] [address]    Report Status PENDING  Incomplete  Culture, blood (Routine X 2) w Reflex to ID Panel     Status: None (Preliminary result)   Collection Time: 08/15/23  6:41 PM   Specimen: BLOOD  Result Value Ref Range Status   Specimen Description BLOOD BLOOD RIGHT HAND  Final   Special Requests   Final    AEROBIC BOTTLE ONLY Blood Culture results may not be optimal due to an inadequate volume of blood received in culture bottles   Culture   Final    NO GROWTH 2 DAYS Performed at [facility], [address], [place], [place] [address]    Report Status PENDING  Incomplete    FURTHER DISCHARGE INSTRUCTIONS:  Get Medicines reviewed and adjusted: Please take all your medications with you for your next visit with your Primary MD  Laboratory/radiological data: Please request your Primary MD to go over all hospital tests and procedure/radiological results at the follow up, please ask your Primary MD to get all Hospital records sent to his/her office.  In some cases, they will be blood work, cultures and biopsy results pending at the time of your discharge. Please request that your primary care M.D. goes through all the records of your hospital data and follows up on these results.  Also Note the following: If you experience worsening of your admission symptoms, develop shortness of breath, life threatening emergency, suicidal or homicidal thoughts you must seek medical attention immediately by calling 911 or calling your MD immediately  if symptoms less severe.  You must read complete instructions/literature along with all the possible adverse reactions/side effects for all the Medicines you take and that have been prescribed to you. Take any new Medicines after you have completely understood and accpet all the possible adverse reactions/side effects.   Do not drive when taking Pain  medications or sleeping medications (Benzodaizepines)  Do not take more than prescribed Pain, Sleep and Anxiety Medications. It is not advisable to combine anxiety,sleep and pain medications without talking with your primary care practitioner  Special Instructions: If you have smoked or chewed Tobacco  in the last 2 yrs please stop smoking, stop any regular Alcohol  and or any Recreational drug use.  Wear Seat belts while driving.  Please note: You were cared for by a hospitalist during your hospital stay. Once you are discharged, your primary care physician will handle any further medical issues. Please note that NO REFILLS for any discharge medications will be authorized once you are discharged, as it is imperative that you return to your primary care physician (or establish a relationship with a primary care physician if you do not have one) for your post hospital discharge needs so that they can reassess your need for medications and monitor your lab values.  Total Time spent coordinating discharge including counseling, education and face to face time equals greater than 30 minutes.  SignedJeoffrey Massed 08/17/2023 11:28 AM

## 2023-08-17 NOTE — Plan of Care (Signed)

## 2023-08-18 ENCOUNTER — Ambulatory Visit: Payer: Medicare Other

## 2023-08-19 ENCOUNTER — Ambulatory Visit: Payer: Medicare Other

## 2023-08-19 DIAGNOSIS — C44399 Other specified malignant neoplasm of skin of other parts of face: Secondary | ICD-10-CM | POA: Diagnosis not present

## 2023-08-19 DIAGNOSIS — G9341 Metabolic encephalopathy: Secondary | ICD-10-CM | POA: Diagnosis not present

## 2023-08-19 DIAGNOSIS — E876 Hypokalemia: Secondary | ICD-10-CM | POA: Diagnosis not present

## 2023-08-20 ENCOUNTER — Ambulatory Visit: Payer: Medicare Other

## 2023-08-20 LAB — CULTURE, BLOOD (ROUTINE X 2)
Culture: NO GROWTH
Culture: NO GROWTH

## 2023-08-23 ENCOUNTER — Ambulatory Visit: Payer: Medicare Other

## 2023-08-24 ENCOUNTER — Ambulatory Visit: Payer: Medicare Other

## 2023-08-25 ENCOUNTER — Ambulatory Visit: Payer: Medicare Other

## 2023-08-25 NOTE — Radiation Completion Notes (Signed)
Patient Name: ANTWIAN, SANTAANA MRN: [id number] Date of Birth: 1935-02-03 Referring Physician: Mathews Robinsons, M.D. Date of Service: 2023-08-25 Radiation Oncologist: Lonie Peak, M.D. [facility]                             RADIATION ONCOLOGY END OF TREATMENT NOTE     Diagnosis: C44.329 Squamous cell carcinoma of skin of other parts of face Staging on 2023-07-13: Squamous cell carcinoma of skin of unspecified parts of face T=pT2, N=pN0, M=cM0 Intent: Curative     ==========DELIVERED PLANS==========  First Treatment Date: 2023-07-26 Last Treatment Date: 2023-08-13   Plan Name: [id number] Site: Face Technique: Electron Mode: Electron Dose Per Fraction: 2.5 Gy Prescribed Dose (Delivered / Prescribed): 20 Gy / 50 Gy Prescribed Fxs (Delivered / Prescribed): 8 / 20     ==========ON TREATMENT VISIT DATES========== 2023-08-09     ==========UPCOMING VISITS==========       ==========APPENDIX - ON TREATMENT VISIT NOTES==========   See weekly On Treatment Notes in Epic for details in the Media tab (listed as Progress notes on the On Treatment Visit Dates listed above).

## 2023-08-26 ENCOUNTER — Ambulatory Visit: Payer: Medicare Other

## 2023-08-27 ENCOUNTER — Ambulatory Visit: Payer: Medicare Other

## 2023-08-28 DEATH — deceased

## 2023-08-30 ENCOUNTER — Ambulatory Visit: Payer: Medicare Other

## 2023-08-31 ENCOUNTER — Ambulatory Visit: Payer: Medicare Other

## 2023-10-20 ENCOUNTER — Ambulatory Visit: Payer: Medicare Other | Admitting: Cardiovascular Disease
